# Patient Record
Sex: Male | Born: 1955 | Race: Black or African American | Hispanic: No | Smoking: Never smoker
Health system: Southern US, Community
[De-identification: ages and names within clinical notes are randomized; demographics above are authoritative.]

## PROBLEM LIST (undated history)

## (undated) DIAGNOSIS — F4310 Post-traumatic stress disorder, unspecified: Secondary | ICD-10-CM

## (undated) DIAGNOSIS — I10 Essential (primary) hypertension: Secondary | ICD-10-CM

## (undated) DIAGNOSIS — D649 Anemia, unspecified: Secondary | ICD-10-CM

## (undated) DIAGNOSIS — G8190 Hemiplegia, unspecified affecting unspecified side: Secondary | ICD-10-CM

## (undated) DIAGNOSIS — I639 Cerebral infarction, unspecified: Secondary | ICD-10-CM

## (undated) DIAGNOSIS — E119 Type 2 diabetes mellitus without complications: Secondary | ICD-10-CM

---

## 2003-07-22 ENCOUNTER — Emergency Department (HOSPITAL_COMMUNITY): Admission: EM | Admit: 2003-07-22 | Discharge: 2003-07-22 | Payer: Self-pay | Admitting: *Deleted

## 2007-04-15 ENCOUNTER — Emergency Department (HOSPITAL_COMMUNITY): Admission: EM | Admit: 2007-04-15 | Discharge: 2007-04-15 | Payer: Self-pay | Admitting: Emergency Medicine
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
## 2011-02-05 ENCOUNTER — Other Ambulatory Visit: Payer: Self-pay | Admitting: Occupational Medicine

## 2011-02-05 ENCOUNTER — Ambulatory Visit: Payer: Self-pay

## 2011-02-05 DIAGNOSIS — Z021 Encounter for pre-employment examination: Secondary | ICD-10-CM

## 2011-02-05 IMAGING — CR DG CHEST 1V
1 series · 1 of 1 positions shown · non-contrast
Comparison: None.

CLINICAL DATA: Pre-employment physical.  Nonsmoker.

CHEST - 1 VIEW

[view not recorded]
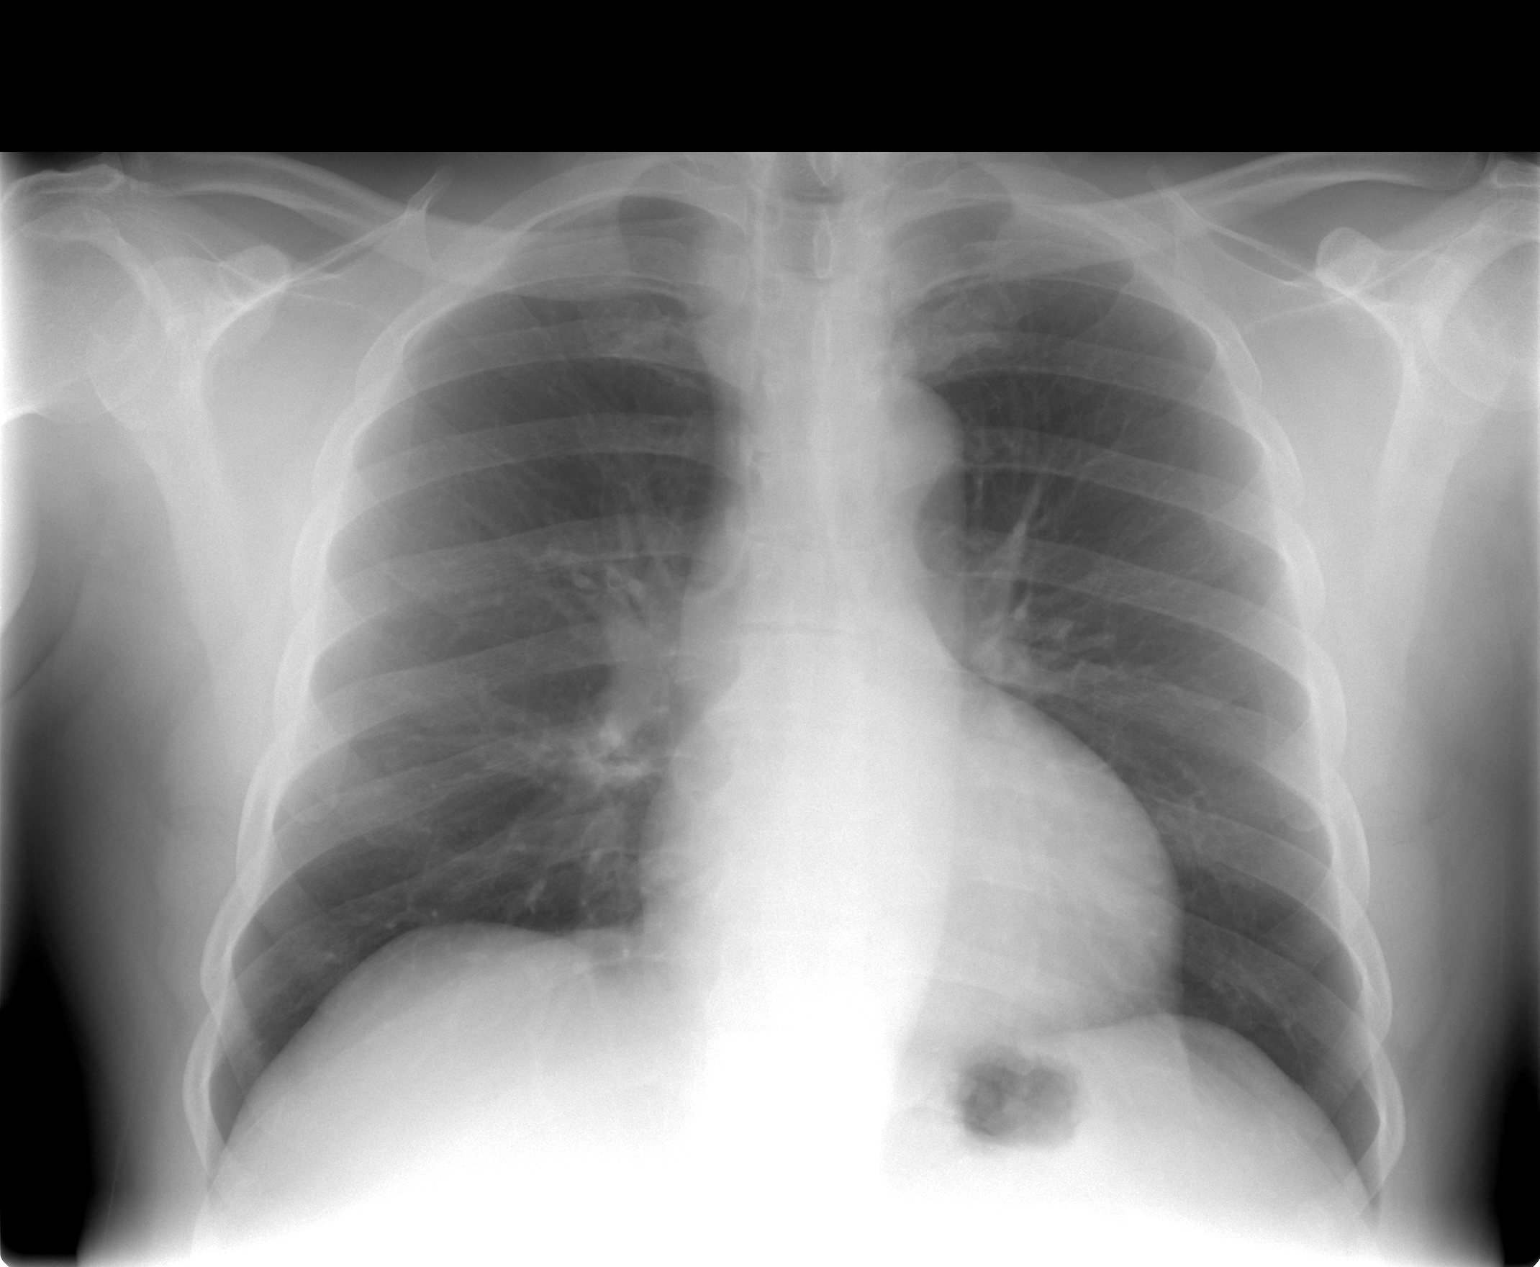

[1 of 1 positions shown; findings below may reference images not displayed]

FINDINGS: The heart and mediastinal contours are within normal
limits. The lungs are clear. No airspace disease, pleural effusion,
or pneumothorax is identified. The visualized bony thorax is
unremarkable.
IMPRESSION: No acute cardiopulmonary disease.

## 2016-03-08 DIAGNOSIS — IMO0002 Reserved for concepts with insufficient information to code with codable children: Secondary | ICD-10-CM | POA: Insufficient documentation

## 2016-03-08 DIAGNOSIS — I10 Essential (primary) hypertension: Secondary | ICD-10-CM | POA: Diagnosis present

## 2016-03-08 DIAGNOSIS — Z7409 Other reduced mobility: Secondary | ICD-10-CM | POA: Insufficient documentation

## 2016-03-25 ENCOUNTER — Encounter: Payer: Self-pay | Admitting: Occupational Therapy

## 2016-03-25 ENCOUNTER — Ambulatory Visit: Payer: Managed Care, Other (non HMO) | Attending: Physical Medicine & Rehabilitation | Admitting: Physical Therapy

## 2016-03-25 ENCOUNTER — Ambulatory Visit: Payer: Managed Care, Other (non HMO) | Admitting: Occupational Therapy

## 2016-03-25 VITALS — BP 156/93

## 2016-03-25 DIAGNOSIS — R41841 Cognitive communication deficit: Secondary | ICD-10-CM | POA: Insufficient documentation

## 2016-03-25 DIAGNOSIS — I679 Cerebrovascular disease, unspecified: Principal | ICD-10-CM

## 2016-03-25 DIAGNOSIS — R2681 Unsteadiness on feet: Secondary | ICD-10-CM | POA: Diagnosis present

## 2016-03-25 DIAGNOSIS — M6281 Muscle weakness (generalized): Secondary | ICD-10-CM | POA: Diagnosis present

## 2016-03-25 DIAGNOSIS — M25612 Stiffness of left shoulder, not elsewhere classified: Secondary | ICD-10-CM | POA: Insufficient documentation

## 2016-03-25 DIAGNOSIS — G8112 Spastic hemiplegia affecting left dominant side: Secondary | ICD-10-CM | POA: Diagnosis present

## 2016-03-25 DIAGNOSIS — R2689 Other abnormalities of gait and mobility: Secondary | ICD-10-CM | POA: Diagnosis present

## 2016-03-25 DIAGNOSIS — I69352 Hemiplegia and hemiparesis following cerebral infarction affecting left dominant side: Secondary | ICD-10-CM

## 2016-03-25 DIAGNOSIS — I69315 Cognitive social or emotional deficit following cerebral infarction: Secondary | ICD-10-CM | POA: Diagnosis present

## 2016-03-25 DIAGNOSIS — R293 Abnormal posture: Secondary | ICD-10-CM | POA: Diagnosis present

## 2016-03-25 DIAGNOSIS — M25622 Stiffness of left elbow, not elsewhere classified: Secondary | ICD-10-CM | POA: Insufficient documentation

## 2016-03-25 NOTE — Therapy (Signed)
[facility] [address] [place], [place], [address] Phone: [phone]   Fax:  [phone]  Occupational Therapy Evaluation  Patient Details  Name: Adam Perez MRN: [id number] Date of Birth: 1963-07-22 Referring Provider: Dr. Renato Gails  Encounter Date: 03/25/2016      OT End of Session - 03/25/16 1223    Visit Number 1   Number of Visits 16   Date for OT Re-Evaluation 05/20/16   Authorization Type [employer], 62 PT/OT ,, no auth required.    OT Start Time 0845   OT Stop Time 0925   OT Time Calculation (min) 40 min   Activity Tolerance Patient tolerated treatment well      History reviewed. No pertinent past medical history.  History reviewed. No pertinent surgical history.  There were no vitals filed for this visit.      Subjective Assessment - 03/25/16 0851    Subjective  My leg and arm are numb   Patient is accompained by: Family member  sister, Marcha   Pertinent History see epic, care everywhere. Pt with R CVA, HTN,    Patient Stated Goals I want to walk normally and be able to hold a cup without dropping it   Currently in Pain? No/denies           [facility] OT Assessment - 03/25/16 5638      Assessment   Diagnosis R CVA   Referring Provider Dr. Renato Gails   Onset Date 03/01/16  symptomatic 02/29/2016   Prior Therapy Inpt rehab PT, OT and ST     Precautions   Precautions Fall   Required Braces or Orthoses Other Brace/Splint   Other Brace/Splint air cast L foot due to fall and sprain of ankle,       Restrictions   Weight Bearing Restrictions No     Balance Screen   Has the patient fallen in the past 6 months Yes   How many times? 1  pt fell the day of the stroke     Home  Environment   Family/patient expects to be discharged to: Private residence   Living Arrangements Other relatives  lives with his brother   Available Help at Discharge --  see below   Type of Home House   Home  Layout One level   Bathroom Shower/Tub Tub/Shower unit  pt is going to get transfer tub Wellsite geologist  pt uses sink to assist with sit to stand   Additional Comments Pt has friends and siblings who can assist. 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   Leisure watch sports     ADL   Eating/Feeding Minimal assistance  for cutting, uses R hand (pt is left handed)   Grooming Modified independent   Upper Body Bathing Minimal assistance   Lower Body Bathing Modified independent   Upper Body Dressing Increased time   Lower Body Dressing Minimal assistance  assist for tying   Toilet Tranfer Modified independent   Toileting - Clothing Manipulation Modified independent   Toileting -  Hygiene Modified Independent   Tub/Shower Transfer Minimal assistance   ADL comments Pt is awaiting transfer tub bench;  showered in inpt rehab using bench and needed min a      IADL   Shopping Needs to be accompanied on any shopping  trip   Light Housekeeping Does not participate in any housekeeping tasks   Meal Prep Needs to have meals prepared and served   [employer] Relies on family or friends for transportation   Medication Management Is responsible for taking medication in correct dosages at correct time   Development worker, communityinancial Management Manages financial matters independently (budgets, writes checks, pays rent, bills goes to bank), collects and keeps track of income     Mobility   Mobility Status History of falls     Written Expression   Dominant Hand Left   Handwriting 100% legible  printing of name with non dominant hand     Vision - History   Baseline Vision Wears glasses only for reading   Additional Comments Pt denies any visual changes since stroke     Activity Tolerance   Activity Tolerance Tolerate 30+ min activity without fatigue      Cognition   Mini Mental State Exam  Pt and sister deny any cognitive changes.  Pt had ST in inpt rehab.  Will need to further assess.   Area of Impairment Safety/judgement;Attention;Awareness   Safety/Judgement Decreased awareness of safety;Decreased awareness of deficits   Awareness Emergent     Sensation   Light Touch Appears Intact   Hot/Cold Appears Intact   Proprioception Appears Intact     Coordination   Gross Motor Movements are Fluid and Coordinated No   Fine Motor Movements are Fluid and Coordinated No   Other Pt does not have enough active movement to complete standardized tests at this time.     Tone   Assessment Location Left Upper Extremity     ROM / Strength   AROM / PROM / Strength AROM;PROM;Strength     AROM   Overall AROM  Deficits   Overall AROM Comments LUE- pt has active shoulder elevation, some active depression, IR, thumb flexion and extension.      PROM   Overall PROM  Deficits   Overall PROM Comments LUE- shoulder flexion 105*, abduction 100* limited by tighness (pt denies pain), elbow extension -5*, no ER (passivley to neutral only), wrist extension 10*, 90* composite flexion.       Strength   Overall Strength Deficits;Unable to assess   Overall Strength Comments Unable to assess due to altered tone;  pt has very limited isolated movement in LUE     Hand Function   Right Hand Gross Grasp Functional   Left Hand Gross Grasp Impaired   Comment Pt only has isolated thumb movement in hand.      LUE Tone   LUE Tone Moderate     LLE Tone   LLE Tone Modified Ashworth     LLE Tone   Modified Ashworth Scale for Grading Hypertonia LLE Considerable increase in muschle tone, passive movement difficult                           OT Short Term Goals - 03/25/16 1053      OT SHORT TERM GOAL #1   Title Pt and family will be mod I with HEP - 04/22/2016   Status New     OT SHORT TERM GOAL #2   Title Pt will demonstrate ability for PROM of  shoulder fleixon to 130* in supine with no pain to aide in self care and as prep for low functional reach.   Status New     OT SHORT TERM GOAL #3   Title Pt  will be mod I with bathing   Status New     OT SHORT TERM GOAL #4   Title Pt will be mod I with tub bench transfers   Status New     OT SHORT TERM GOAL #5   Title Pt will be mod I with tying shoes using AE   Status New           OT Long Term Goals - 03/25/16 1056      OT LONG TERM GOAL #1   Title Pt and family will be mod I with upgraded HEP- 05/20/2016   Status New     OT LONG TERM GOAL #2   Title Pt will be mod I with simple familiar hot meal prep   Status New     OT LONG TERM GOAL #3   Title Pt will demonstrate ability to use LUE as stabilizer during basic ADL's 50% of the time.   Status New     OT LONG TERM GOAL #4   Title Pt will demonstrate ability to employ 2 strategies to reduce tone in LUE in order to improve ability to use LUE as stabilizer in basic ADL's               Plan - 03/25/16 1212    Clinical Impression Statement Pt is a 61 year old male s/p R CVA on 03/01/2016 and hospitalized until 03/15/2016 (inlcudes inpt rehab stay).  PMH: R ICA stenosis 60%, narrowing of R vetebral artery, L ankle sprain from fall, HTN, prediabetic.  Pt presents with the following deficits that impact his ability to complete ADL's, IADl's, leisure and return to work:  Spastic L dominant hemplegia, decreased AROM, decreased PROM, decreased cognition, decreased balance, decreased actiivty tolerance, decreased postural control.  Pt will benefit from skilled OT to address these deficts and maximize independence.     Rehab Potential Good   Clinical Impairments Affecting Rehab Potential decreased insight, severity of UE deficits   OT Frequency 2x / week   OT Duration 8 weeks   OT Treatment/Interventions Self-care/ADL training;Moist Heat;Electrical Stimulation;DME and/or AE instruction;Neuromuscular education;Therapeutic  exercise;Functional Mobility Training;Manual Therapy;Passive range of motion;Splinting;Therapeutic activities;Balance training;Cognitive remediation/compensation   Plan initiate HEP as possible, manual therapy to increase PROM of LUE, NMR for LUE/trunk, balance, functional mobitlity   Consulted and Agree with Plan of Care Patient;Family member/caregiver   Family Member Consulted sister, Charlestine Massed      Patient will benefit from skilled therapeutic intervention in order to improve the following deficits and impairments:  Abnormal gait, Decreased activity tolerance, Decreased balance, Decreased cognition, Decreased knowledge of use of DME, Decreased mobility, Decreased range of motion, Decreased safety awareness, Difficulty walking, Decreased strength, Impaired UE functional use, Impaired tone  Visit Diagnosis: 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, unspecified cerebrovascular disease type (HCC) - Plan: Ot plan of care cert/re-cert  Muscle weakness (generalized) - Plan: Ot plan of care cert/re-cert  Abnormal posture - Plan: Ot plan of care cert/re-cert  Stiffness of left shoulder, not elsewhere classified - Plan: Ot plan of care cert/re-cert  Stiffness of left elbow, not elsewhere classified - Plan: Ot plan of care cert/re-cert  Cognitive social or emotional deficit following cerebral infarction - Plan: Ot plan of care cert/re-cert  Unsteadiness on feet - Plan: Ot plan of care cert/re-cert    Problem List There are no active problems to display for this patient.   Norton Pastel 03/25/2016, 12:28 PM  [facility] [address] [place], [place], [address] Phone: [phone]   Fax:  [phone]  Name: POSEIDON PAM MRN: [id number] Date of Birth: 18-Jan-1956

## 2016-03-25 NOTE — Therapy (Signed)
[facility] [address] [place], [place], [address] Phone: [phone]   Fax:  [phone]  Physical Therapy Evaluation  Patient Details  Name: Adam Perez MRN: [id number] Date of Birth: 1955-05-29 Referring Provider: Renato GailsJohn Aguilar, MD  Encounter Date: 03/25/2016      PT End of Session - 03/25/16 0910    Visit Number 1   Number of Visits 16   Date for PT Re-Evaluation 05/20/16   Authorization Type [employer]-awaiting verification of benefits   PT Start Time 0800   PT Stop Time 0845   PT Time Calculation (min) 45 min   Equipment Utilized During Treatment Gait belt   Activity Tolerance Patient tolerated treatment well   Behavior [facility] Therapy [facility] for tasks assessed/performed      No past medical history on file.  No past surgical history on file.  Vitals:   03/25/16 0815 03/25/16 0835 03/25/16 0845  BP: (!) 167/95 (!) 177/103 (!) 156/93         Subjective Assessment - 03/25/16 0803    Subjective Pt is a 61 y/o male who presents to OPPT s/p R CVA resulting in residual L sided weakness.  Pt had [facility].  Pt presents today in w/c with quad cane reporting he walks short distances only.   Patient is accompained by: Family member  sister   Pertinent History HTN, PTSD   Limitations Standing;Walking;House hold activities   Patient Stated Goals improve mobility, increase use of LUE   Currently in Pain? No/denies            [facility] Assessment - 03/25/16 [id number]      Assessment   Medical Diagnosis R CVA   Referring Provider Renato GailsJohn Aguilar, MD   Onset Date/Surgical Date 02/29/16   Hand Dominance Left   Next MD Visit unsure   Prior Therapy inpatient rehab     Precautions   Precautions Fall   Required Braces or Orthoses Other Brace/Splint   Other Brace/Splint aircast L ankle (due to fall and sprain of ankle)     Restrictions   Weight Bearing Restrictions No     Balance Screen   Has the patient fallen in the past 6 months Yes   How many times? 1   Has the patient had a decrease in activity level because of a fear of falling?  Yes   Is the patient reluctant to leave their home because of a fear of falling?  Yes     Home Environment   Living Environment Private residence   Living Arrangements Other relatives  brother   Available Help at Discharge Family;Available PRN/intermittently  pt home alone ~ 8 hours   Type of Home House   Home Access Stairs to enter   Entrance Stairs-Number of Steps 2   Entrance Stairs-Rails None   Home Layout One level   Home Equipment Cane - quad;Bedside commode;Wheelchair - manual     Prior Function   Level of Independence Independent   Vocation Full time employment   Theatre managerVocation Requirements maintainence mechanic, climbing, working in Engineer, maintenance (IT)electrical boxes, walking most of 12 hour shifts   Leisure watch sports     Cognition   Overall Cognitive Status Impaired/Different from baseline   Area of Impairment Attention;Safety/judgement;Awareness   Safety/Judgement Decreased awareness of safety   Awareness Emergent   Attention Sustained     Observation/Other Assessments   Focus on Therapeutic Outcomes (FOTO)  45 (55% limited; predicted 37% limited)  Stroke Impact Scale  Mobility: 31%; Hand Function: 25% (lower values indicate greater impairment)     Tone   Assessment Location Left Lower Extremity     Strength   Strength Assessment Site Hip;Knee;Ankle   Right Hip Flexion 5/5   Left Hip Flexion 2/5   Right/Left Knee Right;Left   Right Knee Flexion 5/5   Right Knee Extension 5/5   Left Knee Flexion 2/5   Left Knee Extension 3/5   Right Ankle Dorsiflexion 5/5   Left Ankle Dorsiflexion 0/5     Transfers   Transfers Sit to Stand;Stand to Sit   Sit to Stand 5: Supervision;With upper extremity assist;From chair/3-in-1   Stand to Sit 5: Supervision;With upper extremity assist;To chair/3-in-1     Ambulation/Gait    Ambulation/Gait Yes   Ambulation/Gait Assistance 4: Min guard   Ambulation Distance (Feet) 100 Feet   Assistive device Large base quad cane   Gait Pattern Step-to pattern;Decreased stance time - left;Decreased step length - right;Decreased hip/knee flexion - left;Decreased dorsiflexion - left;Left circumduction;Left hip hike   Ambulation Surface Level;Indoor   Gait velocity 0.86 ft/sec  2m: 37.83 sec   Gait Comments utilized aircast for L ankle stability     Standardized Balance Assessment   Standardized Balance Assessment Timed Up and Go Test     Timed Up and Go Test   Normal TUG (seconds) 32.53     LLE Tone   LLE Tone Moderate                           PT Education - 03/25/16 0909    Education provided Yes   Education Details clinical findings, POC, goals of care   Person(s) Educated Patient   Methods Explanation   Comprehension Verbalized understanding          PT Short Term Goals - 03/25/16 0916      PT SHORT TERM GOAL #1   Title verbalize understanding of CVA risk factors/warning signs (Target Date for all STGs: 04/22/16)   Status New     PT SHORT TERM GOAL #2   Title perform BERG with appropriate LTG to be written    Status New     PT SHORT TERM GOAL #3   Title improve timed up and go to < 27 sec for improved mobility   Status New     PT SHORT TERM GOAL #4   Title improve gait velocity to > 1.2 ft/sec for improved mobility   Status New           PT Long Term Goals - 03/25/16 1610      PT LONG TERM GOAL #1   Title independent with HEP (Target Date for all LTGs: 05/20/16)   Status New     PT LONG TERM GOAL #2   Title amb > 500' on various indoor/outdoor surfaces with LRAD (and possibly AFO) modified independent for improved community access   Status New     PT LONG TERM GOAL #3   Title improve timed up and go to < 20 sec for improved mobility    Status New     PT LONG TERM GOAL #4   Title improve gait velocity to > 1.8 ft/sec  for decreased fall risk and improved mobility   Status New               Plan - 03/25/16 0911    Clinical Impression Statement Pt is 61 y/o  male who presents to OPPT for high complexity PT eval following CVA on 02/29/16.  Pt presents today with gait abnormalities, decreased strength, decreased balance and increased tone affecting safe, functional mobility.  Pt will benefit from PT to address deficits listed.  BP elevated after amb which decreased after 5 min seated rest break.   Rehab Potential Good   Clinical Impairments Affecting Rehab Potential cognition, severity of deficits   PT Frequency 2x / week   PT Duration 8 weeks   PT Treatment/Interventions ADLs/Self Care Home Management;Electrical Stimulation;Cryotherapy;Neuromuscular re-education;Balance training;Therapeutic exercise;Therapeutic activities;Functional mobility training;Stair training;Gait training;DME Instruction;Patient/family education;Orthotic Fit/Training;Vestibular;Taping   PT Next Visit Plan try AFO with gait, establish HEP for LE strengthening, balance and gait   Consulted and Agree with Plan of Care Patient;Family member/caregiver   Family Member Consulted sister      Patient will benefit from skilled therapeutic intervention in order to improve the following deficits and impairments:  Abnormal gait, Decreased activity tolerance, Decreased balance, Decreased cognition, Decreased mobility, Difficulty walking, Decreased strength, Impaired tone, Decreased knowledge of precautions, Decreased knowledge of use of DME  Visit Diagnosis: Hemiplegia and hemiparesis following cerebral infarction affecting left dominant side (HCC) - Plan: PT plan of care cert/re-cert  Muscle weakness (generalized) - Plan: PT plan of care cert/re-cert  Other abnormalities of gait and mobility - Plan: PT plan of care cert/re-cert     Problem List There are no active problems to display for this patient.     Clarita Crane, PT,  DPT 03/25/16 10:25 AM    [facility] [address] [place], [place], [address] Phone: [phone]   Fax:  [phone]  Name: Adam Perez MRN: [id number] Date of Birth: Aug 14, 1955

## 2016-03-29 ENCOUNTER — Ambulatory Visit: Payer: Managed Care, Other (non HMO)

## 2016-04-02 ENCOUNTER — Ambulatory Visit: Payer: Managed Care, Other (non HMO) | Admitting: *Deleted

## 2016-04-02 ENCOUNTER — Encounter: Payer: Self-pay | Admitting: Occupational Therapy

## 2016-04-02 ENCOUNTER — Ambulatory Visit: Payer: Managed Care, Other (non HMO) | Admitting: Speech Pathology

## 2016-04-02 ENCOUNTER — Ambulatory Visit: Payer: Managed Care, Other (non HMO) | Admitting: Occupational Therapy

## 2016-04-02 DIAGNOSIS — M25612 Stiffness of left shoulder, not elsewhere classified: Secondary | ICD-10-CM

## 2016-04-02 DIAGNOSIS — I679 Cerebrovascular disease, unspecified: Principal | ICD-10-CM

## 2016-04-02 DIAGNOSIS — M6281 Muscle weakness (generalized): Secondary | ICD-10-CM

## 2016-04-02 DIAGNOSIS — R293 Abnormal posture: Secondary | ICD-10-CM

## 2016-04-02 DIAGNOSIS — I69352 Hemiplegia and hemiparesis following cerebral infarction affecting left dominant side: Secondary | ICD-10-CM | POA: Diagnosis not present

## 2016-04-02 DIAGNOSIS — R41841 Cognitive communication deficit: Secondary | ICD-10-CM

## 2016-04-02 DIAGNOSIS — I69315 Cognitive social or emotional deficit following cerebral infarction: Secondary | ICD-10-CM

## 2016-04-02 DIAGNOSIS — G8112 Spastic hemiplegia affecting left dominant side: Secondary | ICD-10-CM

## 2016-04-02 DIAGNOSIS — M25622 Stiffness of left elbow, not elsewhere classified: Secondary | ICD-10-CM

## 2016-04-02 DIAGNOSIS — R2681 Unsteadiness on feet: Secondary | ICD-10-CM

## 2016-04-02 NOTE — Therapy (Signed)
[facility] [address] [place], [place], [address] Phone: [phone]   Fax:  [phone]  Occupational Therapy Treatment  Patient Details  Name: Adam Perez MRN: [id number] Date of Birth: [birth year] Referring Provider: Dr. Renato Gails  Encounter Date: 04/02/2016      OT End of Session - 04/02/16 1649    Visit Number 2   Number of Visits 16   Date for OT Re-Evaluation 05/20/16   Authorization Type [employer], 60 PT/OT ,, no auth required.    OT Start Time 1403   OT Stop Time 1445   OT Time Calculation (min) 42 min   Activity Tolerance Patient tolerated treatment well      History reviewed. No pertinent past medical history.  History reviewed. No pertinent surgical history.  There were no vitals filed for this visit.      Subjective Assessment - 04/02/16 1408    Subjective  What do you want me to do at home?   Pertinent History see epic, care everywhere. Pt with R CVA, HTN,    Patient Stated Goals I want to walk normally and be able to hold a cup without dropping it   Currently in Pain? No/denies                      OT Treatments/Exercises (OP) - 04/02/16 0001      ADLs   ADL Comments Reviewed goals, POC with pt - pt had numerous questions and comments. Pt is very tangential and verbose and requires max redirection to stay on topic.  Pt in agreement with goals and plan.     Neurological Re-education Exercises   Other Exercises 1 Neuro re ed in supine to address scapular alignment, normalize tone and begin to address scapular/trunk stability.  Pt requires max vc's and redirection.  ALso addressed aligment and normal movement patterns for bed mobility for sit to supine and supine to sit - pt able to complete with max cues for impulsivity and for sequencing.  DIscussed need to respect any pain pt is having in the shoulder vs just hauling arm up over his head - after much dicussion pt verbalized  understanding.  Addressed alignment and using LE's during sit to stand, stand to sit and transfers using quad cane to encourage more active LLE and trunk as well as beginning to address scapular stability.                   OT Short Term Goals - 04/02/16 1647      OT SHORT TERM GOAL #1   Title Pt and family will be mod I with HEP - 04/22/2016   Status On-going     OT SHORT TERM GOAL #2   Title Pt will demonstrate ability for PROM of shoulder fleixon to 130* in supine with no pain to aide in self care and as prep for low functional reach.   Status On-going     OT SHORT TERM GOAL #3   Title Pt will be mod I with bathing   Status On-going     OT SHORT TERM GOAL #4   Title Pt will be mod I with tub bench transfers   Status On-going     OT SHORT TERM GOAL #5   Title Pt will be mod I with tying shoes using AE   Status On-going           OT Long Term Goals - 04/02/16 1647  OT LONG TERM GOAL #1   Title Pt and family will be mod I with upgraded HEP- 05/20/2016   Status On-going     OT LONG TERM GOAL #2   Title Pt will be mod I with simple familiar hot meal prep   Status On-going     OT LONG TERM GOAL #3   Title Pt will demonstrate ability to use LUE as stabilizer during basic ADL's 50% of the time.   Status On-going     OT LONG TERM GOAL #4   Title Pt will demonstrate ability to employ 2 strategies to reduce tone in LUE in order to improve ability to use LUE as stabilizer in basic ADL's   Status On-going               Plan - 04/02/16 1647    Clinical Impression Statement Pt with slow progress toward goals. Pt needs max cues and max redirection for impulsivity, disinhibition.    Rehab Potential Good   Clinical Impairments Affecting Rehab Potential decreased insight, severity of UE deficits   OT Frequency 2x / week   OT Duration 8 weeks   OT Treatment/Interventions Self-care/ADL training;Moist Heat;Electrical Stimulation;DME and/or AE  instruction;Neuromuscular education;Therapeutic exercise;Functional Mobility Training;Manual Therapy;Passive range of motion;Splinting;Therapeutic activities;Balance training;Cognitive remediation/compensation   Plan NMR for LUE/trunk for proximal stability and control, functional mobility, manual therapy, balance.    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   Norton Pastelulaski, Geraldina Parrott Halliday, OTR/L 04/02/2016, 4:50 PM  [facility] [address] [place], [place], [address] Phone: [phone]   Fax:  [phone]  Name: Adam Perez MRN: [id number] Date of Birth: 1955/10/06

## 2016-04-02 NOTE — Therapy (Signed)
[facility] [address] [place], [place], [address] Phone: [phone]   Fax:  [phone]  Speech Language Pathology Evaluation  Patient Details  Name: Adam Perez MRN: [id number] Date of Birth: Nov 07, 1955 Referring Provider: Dr. Renato Gails  Encounter Date: 04/02/2016      End of Session - 04/02/16 1502    Visit Number 1   Number of Visits 1   Date for SLP Re-Evaluation --  N/A   SLP Start Time 1317   SLP Stop Time  1402   SLP Time Calculation (min) 45 min   Activity Tolerance Patient tolerated treatment well      No past medical history on file.  No past surgical history on file.  There were no vitals filed for this visit.      Subjective Assessment - 04/02/16 1323    Subjective "I had speech therapy - we worked on my memory, attention and doing 2 things at once - I did good"            SLP Evaluation [facility] - 04/02/16 1324      SLP Visit Information   SLP Received On 04/02/16   Referring Provider aguilar, Jonny Ruiz   Onset Date 02/29/16   Medical Diagnosis CVA     Subjective   Subjective "It don't seem like the stroke affected my thinking"     Pain Assessment   Currently in Pain? 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)  version 2                               Plan - 04/02/16 1503    Clinical Impression Statement [name], a 61 y.o. male  is s/p CVA on 02/29/16, hospitalized 03/01/16 to 03/15/16. He received ST in the hospital for med management and "doing 2 things at once" per pt. 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 He received ST as a child and reports no change in his speech since this CVA.  Pt denies difficulty with memory. He reports he is managing his medication and finances successfully at this time. [facility] Cognitive Assessment revealed score WFL, with recall of 3/5 words with a delay, however pt recalls details of PT/OT evals and course of tx and recent med changes with mod I. Pt solved simple time and money word problems presented orally with mod I. He demonstrated adequate divided attention  performing informal check writing/balancing task while participating in conversation with adequate attention to details and accurate check writing and balancing.  As pt currently with left side UE and LE paresis, independent participation in ADL's is  limited. At this time, I do not recommend skilled ST as cognition is [facility] for tasks assessed.    Consulted and Agree with Plan of Care Patient      Patient will benefit from skilled therapeutic intervention in order to improve the following deficits and impairments:   Cognitive communication deficit      G-Codes - 04/02/16 1512    Functional Limitations Memory   Memory Current Status (N8295(G9168) At least 1 percent but less than 20 percent impaired, limited or restricted   Memory Goal Status (A2130(G9169) At least 1 percent but less than 20 percent impaired, limited or restricted   Memory Discharge Status (G9170) At least 1 percent but less than 20 percent impaired, limited or restricted      Problem List There are no active problems to display for this  patient.   Dominiq Fontaine, Radene JourneyLaura Ann MS, CCC-SLP 04/02/2016, 3:12 PM  [facility] [address] [place], [place], [address] Phone: [phone]   Fax:  [phone]  Name: Adam AnnaDonald L Perez MRN: [id number] Date of Birth: [birth year]

## 2016-04-03 ENCOUNTER — Encounter: Payer: Self-pay | Admitting: Occupational Therapy

## 2016-04-03 ENCOUNTER — Ambulatory Visit: Payer: Managed Care, Other (non HMO) | Admitting: Physical Therapy

## 2016-04-03 ENCOUNTER — Encounter: Payer: Self-pay | Admitting: Physical Therapy

## 2016-04-03 ENCOUNTER — Ambulatory Visit: Payer: Managed Care, Other (non HMO) | Admitting: Occupational Therapy

## 2016-04-03 VITALS — BP 148/88 | HR 68

## 2016-04-03 DIAGNOSIS — I69352 Hemiplegia and hemiparesis following cerebral infarction affecting left dominant side: Secondary | ICD-10-CM

## 2016-04-03 DIAGNOSIS — I679 Cerebrovascular disease, unspecified: Principal | ICD-10-CM

## 2016-04-03 DIAGNOSIS — M6281 Muscle weakness (generalized): Secondary | ICD-10-CM

## 2016-04-03 DIAGNOSIS — G8112 Spastic hemiplegia affecting left dominant side: Secondary | ICD-10-CM

## 2016-04-03 DIAGNOSIS — R2681 Unsteadiness on feet: Secondary | ICD-10-CM

## 2016-04-03 DIAGNOSIS — I69315 Cognitive social or emotional deficit following cerebral infarction: Secondary | ICD-10-CM

## 2016-04-03 DIAGNOSIS — R2689 Other abnormalities of gait and mobility: Secondary | ICD-10-CM

## 2016-04-03 DIAGNOSIS — M25622 Stiffness of left elbow, not elsewhere classified: Secondary | ICD-10-CM

## 2016-04-03 DIAGNOSIS — R293 Abnormal posture: Secondary | ICD-10-CM

## 2016-04-03 DIAGNOSIS — M25612 Stiffness of left shoulder, not elsewhere classified: Secondary | ICD-10-CM

## 2016-04-03 NOTE — Therapy (Signed)
[facility] [address] [place], [place], [address] Phone: [phone]   Fax:  [phone]  Physical Therapy Treatment  Patient Details  Name: Adam Perez MRN: [id number] Date of Birth: [birth year] Referring Provider: Renato Gails, MD  Encounter Date: 04/03/2016      PT End of Session - 04/03/16 1313    Visit Number 2   Number of Visits 16   Date for PT Re-Evaluation 05/20/16   Authorization Type [employer]-60 visit limit combined PT/OT   PT Start Time 1024   PT Stop Time 1109   PT Time Calculation (min) 45 min   Activity Tolerance Patient tolerated treatment well   Behavior During Therapy [facility] for tasks assessed/performed      History reviewed. No pertinent past medical history.  History reviewed. No pertinent surgical history.  Vitals:   04/03/16 1034  BP: (!) 148/88  Pulse: 68        Subjective Assessment - 04/03/16 1029    Subjective Pt without any complaints; pt stating he is out of his blood thinner and asking how to get it refilled-deferred to pharmacy.  Needs help to put L ankle support on.   Pertinent History HTN, PTSD   Limitations Standing;Walking;House hold activities   Patient Stated Goals improve mobility/walking, increase use of LUE   Currently in Pain? No/denies                         [facility] Adult PT Treatment/Exercise - 04/03/16 1036      Standardized Balance Assessment   Standardized Balance Assessment Berg Balance Test     Berg Balance Test   Sit to Stand Able to stand  independently using hands   Standing Unsupported Able to stand 2 minutes with supervision   Sitting with Back Unsupported but Feet Supported on Floor or Stool Able to sit safely and securely 2 minutes   Stand to Sit Controls descent by using hands   Transfers Able to transfer safely, minor use of hands   Standing Unsupported with Eyes Closed Able to stand 10 seconds with supervision   Standing  Ubsupported with Feet Together Able to place feet together independently and stand for 1 minute with supervision   From Standing, Reach Forward with Outstretched Arm Can reach forward >12 cm safely (5")   From Standing Position, Pick up Object from Floor Able to pick up shoe, needs supervision   From Standing Position, Turn to Look Behind Over each Shoulder Looks behind one side only/other side shows less weight shift   Turn 360 Degrees Needs close supervision or verbal cueing   Standing Unsupported, Alternately Place Feet on Step/Stool Able to complete >2 steps/needs minimal assist   Standing Unsupported, One Foot in Front Able to plae foot ahead of the other independently and hold 30 seconds   Standing on One Leg Able to lift leg independently and hold equal to or more than 3 seconds   Total Score 39     Exercises   Exercises Knee/Hip     Knee/Hip Exercises: Seated   Stool Scoot - Round Trips in w/c with LLE only x 5'; minimal activation noted   Sit to Sand 1 set;10 reps;without UE support  UE on LLE to facilitate weight shift and WB through LLE     Knee/Hip Exercises: Supine   Bridges Strengthening;Both;1 set;10 reps   Bridges Limitations verbal cues for equal activation of L glute and to maintain hip IR  PT Education - 04/03/16 1312    Education provided Yes   Education Details administered BERG; educated on falls risk.  Initiated HEP but did not provide handouts this session   Person(s) Educated Patient   Methods Explanation   Comprehension Verbalized understanding          PT Short Term Goals - 04/03/16 1641      PT SHORT TERM GOAL #1   Title verbalize understanding of CVA risk factors/warning signs (Target Date for all STGs: 04/22/16)   Status New     PT SHORT TERM GOAL #2   Title perform BERG with appropriate LTG to be written    Status Achieved     PT SHORT TERM GOAL #3   Title improve timed up and go to < 27 sec for improved mobility    Status New     PT SHORT TERM GOAL #4   Title improve gait velocity to > 1.2 ft/sec for improved mobility   Status New     PT SHORT TERM GOAL #5   Title Will improve BERG balance score to >45/56 to decrease risk for falls    Baseline 39/56 (04/03/16)   Status New           PT Long Term Goals - 03/25/16 1610      PT LONG TERM GOAL #1   Title independent with HEP (Target Date for all LTGs: 05/20/16)   Status New     PT LONG TERM GOAL #2   Title amb > 500' on various indoor/outdoor surfaces with LRAD (and possibly AFO) modified independent for improved community access   Status New     PT LONG TERM GOAL #3   Title improve timed up and go to < 20 sec for improved mobility    Status New     PT LONG TERM GOAL #4   Title improve gait velocity to > 1.8 ft/sec for decreased fall risk and improved mobility   Status New               Plan - 04/03/16 1313    Clinical Impression Statement Treatment session with focus on continued assessment of balance, falls risk with BERG balance assessment; pt demonstrating significant falls risk.  Initiated sit <> stand training with focus on L NMR and increasing WB and activation through LLE and increasing activation of L hamstring mm with bridges and single leg pull in w/c.  Will continue to address and develop HEP.   Rehab Potential Good   Clinical Impairments Affecting Rehab Potential cognition, severity of deficits   PT Treatment/Interventions ADLs/Self Care Home Management;Electrical Stimulation;Cryotherapy;Neuromuscular re-education;Balance training;Therapeutic exercise;Therapeutic activities;Functional mobility training;Stair training;Gait training;DME Instruction;Patient/family education;Orthotic Fit/Training;Vestibular;Taping   PT Next Visit Plan try AFO with gait, set HEP and provide handouts, 2 stairs with cane   Consulted and Agree with Plan of Care Patient      Patient will benefit from skilled therapeutic intervention in order  to improve the following deficits and impairments:  Abnormal gait, Decreased activity tolerance, Decreased balance, Decreased cognition, Decreased mobility, Difficulty walking, Decreased strength, Impaired tone, Decreased knowledge of precautions, Decreased knowledge of use of DME  Visit Diagnosis: Hemiplegia and hemiparesis following cerebral infarction affecting left dominant side (HCC)  Muscle weakness (generalized)  Other abnormalities of gait and mobility     Problem List There are no active problems to display for this patient.  Edman Circle, PT, DPT 04/03/16    4:45 PM    [facility] [address] [place], [place], [address] Phone: [phone]   Fax:  [phone]  Name: Adam Perez MRN: [id number] Date of Birth: 11/06/1955

## 2016-04-03 NOTE — Therapy (Signed)
[facility] [address] [place], [place], [address] Phone: [phone]   Fax:  [phone]  Occupational Therapy Treatment  Patient Details  Name: Adam Perez Perez: [id number] Date of Birth: Mar 23, 1955 Referring Provider: Dr. Renato GailsJohn Perez  Encounter Date: 04/03/2016      OT End of Session - 04/03/16 1255    Visit Number 3   Number of Visits 16   Date for OT Re-Evaluation 05/20/16   Authorization Type [employer], 60 PT/OT ,, no auth required.    OT Start Time 1145   OT Stop Time 1232   OT Time Calculation (min) 47 min   Activity Tolerance Patient tolerated treatment well      History reviewed. No pertinent past medical history.  History reviewed. No pertinent surgical history.  There were no vitals filed for this visit.      Subjective Assessment - 04/03/16 1153    Subjective  I can control sitting down when I really want to.    Pertinent History see epic, care everywhere. Pt with R CVA, HTN,    Patient Stated Goals I want to walk normally and be able to hold a cup without dropping it   Currently in Pain? No/denies  Pt does have intermittent pain in L shoulder with passive flexion beyond 90*                      OT Treatments/Exercises (OP) - 04/03/16 0001      Neurological Re-education Exercises   Other Exercises 1 Neuro re ed to address functional ambulation with quad cane with emphasis on aligment, weight shifting to the left, increased activation of LLE and trunk as well as L proximal shoulder girdle with functional ambulation.  ALso addresed controlled sit to stand and stand to sit without UE's to improve finding midline, increased activiation of L side during activity.  Addressed building LUE proximal stability in sitting with sideleaning on LUE with reaching activity with RUE - after repetition and mod vc's for impulsivity pt able to control L shoulder girdle indepedent of assist.  Progressed  to unilateral low reach in closed chain on moving surface for immediate feedback - with guided movement and repetition pt eventually able to initiate foward flexion and holding of moveable surface at various points of low reach. Also addressed using LUE as stabilizer with forced paradaigm in standing.                   OT Short Term Goals - 04/03/16 1254      OT SHORT TERM GOAL #1   Title Pt and family will be mod I with HEP - 04/22/2016   Status On-going     OT SHORT TERM GOAL #2   Title Pt will demonstrate ability for PROM of shoulder fleixon to 130* in supine with no pain to aide in self care and as prep for low functional reach.   Status On-going     OT SHORT TERM GOAL #3   Title Pt will be mod I with bathing   Status On-going     OT SHORT TERM GOAL #4   Title Pt will be mod I with tub bench transfers   Status On-going     OT SHORT TERM GOAL #5   Title Pt will be mod I with tying shoes using AE   Status On-going           OT Long Term Goals - 04/03/16 1254  OT LONG TERM GOAL #1   Title Pt and family will be mod I with upgraded HEP- 05/20/2016   Status On-going     OT LONG TERM GOAL #2   Title Pt will be mod I with simple familiar hot meal prep   Status On-going     OT LONG TERM GOAL #3   Title Pt will demonstrate ability to use LUE as stabilizer during basic ADL's 50% of the time.   Status On-going     OT LONG TERM GOAL #4   Title Pt will demonstrate ability to employ 2 strategies to reduce tone in LUE in order to improve ability to use LUE as stabilizer in basic ADL's   Status On-going               Plan - 04/03/16 1254    Clinical Impression Statement Pt progressing toward goals. Pt with improved attention today and decreased verbosity.    Rehab Potential Good   Clinical Impairments Affecting Rehab Potential decreased insight, severity of UE deficits   OT Frequency 2x / week   OT Duration 8 weeks   OT Treatment/Interventions  Self-care/ADL training;Moist Heat;Electrical Stimulation;DME and/or AE instruction;Neuromuscular education;Therapeutic exercise;Functional Mobility Training;Manual Therapy;Passive range of motion;Splinting;Therapeutic activities;Balance training;Cognitive remediation/compensation   Plan NMR for LUE/trunk for proximal stability and beginning shoulder flexion/low reach, functional mobility, manual therapy, balance.    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   [name] , OTR/L 04/03/2016, 12:57 PM  [facility] [address] [place], [place], [address] Phone: [phone]   Fax:  [phone]  Name: Adam Perez: [id number] Date of Birth: 1955-05-27

## 2016-04-04 ENCOUNTER — Ambulatory Visit: Payer: Managed Care, Other (non HMO) | Admitting: *Deleted

## 2016-04-04 ENCOUNTER — Ambulatory Visit: Payer: Managed Care, Other (non HMO) | Admitting: Physical Therapy

## 2016-04-10 ENCOUNTER — Ambulatory Visit: Payer: Managed Care, Other (non HMO) | Admitting: Occupational Therapy

## 2016-04-10 ENCOUNTER — Ambulatory Visit: Payer: Managed Care, Other (non HMO) | Admitting: Physical Therapy

## 2016-04-10 ENCOUNTER — Encounter: Payer: Self-pay | Admitting: Occupational Therapy

## 2016-04-10 ENCOUNTER — Encounter: Payer: Self-pay | Admitting: Physical Therapy

## 2016-04-10 DIAGNOSIS — M25622 Stiffness of left elbow, not elsewhere classified: Secondary | ICD-10-CM

## 2016-04-10 DIAGNOSIS — I69315 Cognitive social or emotional deficit following cerebral infarction: Secondary | ICD-10-CM

## 2016-04-10 DIAGNOSIS — G8112 Spastic hemiplegia affecting left dominant side: Secondary | ICD-10-CM

## 2016-04-10 DIAGNOSIS — I679 Cerebrovascular disease, unspecified: Secondary | ICD-10-CM

## 2016-04-10 DIAGNOSIS — M6281 Muscle weakness (generalized): Secondary | ICD-10-CM

## 2016-04-10 DIAGNOSIS — R2681 Unsteadiness on feet: Secondary | ICD-10-CM

## 2016-04-10 DIAGNOSIS — I69352 Hemiplegia and hemiparesis following cerebral infarction affecting left dominant side: Secondary | ICD-10-CM

## 2016-04-10 DIAGNOSIS — R293 Abnormal posture: Secondary | ICD-10-CM

## 2016-04-10 DIAGNOSIS — M25612 Stiffness of left shoulder, not elsewhere classified: Secondary | ICD-10-CM

## 2016-04-10 DIAGNOSIS — R2689 Other abnormalities of gait and mobility: Secondary | ICD-10-CM

## 2016-04-10 NOTE — Therapy (Signed)
[facility] [address] [place], [place], [address] Phone: [phone]   Fax:  [phone]  Physical Therapy Treatment  Patient Details  Name: Adam Perez MRN: [id number] Date of Birth: Mar 26, 1955 Referring Provider: Renato Gails, MD  Encounter Date: 04/10/2016      PT End of Session - 04/10/16 0949    Visit Number 3   Number of Visits 16   Date for PT Re-Evaluation 05/20/16   Authorization Type [employer]-60 visit limit combined PT/OT   PT Start Time 0839   PT Stop Time 0930   PT Time Calculation (min) 51 min   Activity Tolerance Patient limited by pain;Patient tolerated treatment well   Behavior During Therapy [facility] for tasks assessed/performed      History reviewed. No pertinent past medical history.  History reviewed. No pertinent surgical history.  There were no vitals filed for this visit.      Subjective Assessment - 04/10/16 0915    Subjective No c/o since last visit; reports attempting exercises at home.  Pt with MD appointment soon, pt is concerned about FMLA and being ready to return to work after 8 weeks.   Pertinent History HTN, PTSD   Limitations Standing;Walking;House hold activities   Patient Stated Goals improve mobility/walking, increase use of LUE for work   Currently in Pain? No/denies          [facility] Adult PT Treatment/Exercise - 04/10/16 0917      Ambulation/Gait   Ambulation/Gait Yes   Ambulation/Gait Assistance 4: Min guard   Ambulation Distance (Feet) 100 Feet   Assistive device Large base quad cane   Ambulation Surface Level;Indoor   Gait Comments No aircast used today; Verbal, visual and manual cues during gait to facilitate increased lateral weight shifting, WB and increased stance time on LLE, full step length RLE and activation of L hamstring and hip flexors during swing for increased foot clearance and step length.     Neuro Re-ed    Neuro Re-ed Details  Performed NMR for  LLE in prone; required mod A to transition in to prone and position LUE in ABD and ER off side of mat.  Also placed in prone to stretch anterior shoulder and hip mm while performing activation of L hamstring and gluteal mm with max tactile and verbal cues.  Transitioned to quadruped > tall kneeling where NMR focused on lateral weight shifting with pelvis, weight shifting to L and performing tall kneeling squats for activation of hamstring and glutes, and lateral weight shifting with alternating R and LLE advancement forwards and backwards with max verbal, tactile cues at pelvis and trunk for elongation<>shortening.  Continued NMR in sitting with focus on low reaching forwards and to the L and weight shifting/WB onto LUE and LLE during controlled sit <> stand with max tactile and verbal at head and trunk to minimize overuse and shortening of R neck and trunk muscles.             PT Education - 04/10/16 0945    Education provided Yes   Education Details Continued education on functional WB and use of LUE and LLE to assist with regaining movement and function.   Person(s) Educated Patient   Methods Explanation   Comprehension Need further instruction          PT Short Term Goals - 04/03/16 1641      PT SHORT TERM GOAL #1   Title verbalize understanding of CVA risk factors/warning signs (Target Date  for all STGs: 04/22/16)   Status New     PT SHORT TERM GOAL #2   Title perform BERG with appropriate LTG to be written    Status Achieved     PT SHORT TERM GOAL #3   Title improve timed up and go to < 27 sec for improved mobility   Status New     PT SHORT TERM GOAL #4   Title improve gait velocity to > 1.2 ft/sec for improved mobility   Status New     PT SHORT TERM GOAL #5   Title Will improve BERG balance score to >45/56 to decrease risk for falls    Baseline 39/56 (04/03/16)   Status New           PT Long Term Goals - 03/25/16 [id number]      PT LONG TERM GOAL #1   Title independent  with HEP (Target Date for all LTGs: 05/20/16)   Status New     PT LONG TERM GOAL #2   Title amb > 500' on various indoor/outdoor surfaces with LRAD (and possibly AFO) modified independent for improved community access   Status New     PT LONG TERM GOAL #3   Title improve timed up and go to < 20 sec for improved mobility    Status New     PT LONG TERM GOAL #4   Title improve gait velocity to > 1.8 ft/sec for decreased fall risk and improved mobility   Status New               Plan - 04/10/16 1034    Clinical Impression Statement Treatment session with focus on continued NMR for motor activation on L side and grading of muscle activation on R side during gait and in WB positions after performing ROM to allow for more ROM in shoulder, trunk, pelvis and LE.  Pt very motivated to regain function in L side.  Pt tolerated well but continues to have increased tone and limited ROM on L side and will benefit from continued PT services to address impairments and progress towards targeted goals.   Rehab Potential Good   Clinical Impairments Affecting Rehab Potential cognition, severity of deficits   PT Treatment/Interventions ADLs/Self Care Home Management;Electrical Stimulation;Cryotherapy;Neuromuscular re-education;Balance training;Therapeutic exercise;Therapeutic activities;Functional mobility training;Stair training;Gait training;DME Instruction;Patient/family education;Orthotic Fit/Training;Vestibular;Taping   PT Next Visit Plan set HEP and provide handouts-stretching hip flexor, hamstring and lateral hip activation.  AFO with gait?   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  Edman CircleAudra Hall, PT, DPT 04/10/16    10:41 AM    [facility] [address] [place], [place], [address] Phone: [phone]   Fax:  [phone]  Name: Adam Perez MRN: [id number] Date of Birth: Mar 17, 1955

## 2016-04-10 NOTE — Therapy (Signed)
[facility] [address] [place], [place], [address] Phone: [phone]   Fax:  [phone]  Occupational Therapy Treatment  Patient Details  Name: Adam Perez MRN: [id number] Date of Birth: Aug 05, 1955 Referring Provider: Dr. Renato Gails  Encounter Date: 04/10/2016      OT End of Session - 04/10/16 1300    Visit Number 4   Number of Visits 16   Date for OT Re-Evaluation 05/20/16   Authorization Type [employer], 60 PT/OT ,, no auth required.    OT Start Time [phone]   OT Stop Time 1016   OT Time Calculation (min) 45 min   Activity Tolerance Patient tolerated treatment well      History reviewed. No pertinent past medical history.  History reviewed. No pertinent surgical history.  There were no vitals filed for this visit.      Subjective Assessment - 04/10/16 0935    Subjective  I am getting around a little better   Pertinent History see epic, care everywhere. Pt with R CVA, HTN,    Patient Stated Goals I want to walk normally and be able to hold a cup without dropping it   Currently in Pain? No/denies                      OT Treatments/Exercises (OP) - 04/10/16 0001      ADLs   ADL Comments Addressed self feeding (rocker knife), dressing (shoe buttons), bathing (hemi bathing technique and use of LH sponge) and simple food prep with adapted cutting board and use of rolling cart.  Pt strongly encouraged to have supervision from wife while in kitchen for safety and balance -pt verbalized agreement. Pt able to practice using all equipment and strategies and is now able to complete bathing and dresssing with strategies.  Also addressed balance, functional mobility and safety in kitchen.                  OT Education - 04/10/16 1147    Education provided Yes   Education Details ADL strategies and equipment - pt given name of medical suppy with address and phone number.    Person(s) Educated  Patient   Methods Explanation;Demonstration;Verbal cues;Handout   Comprehension Verbalized understanding;Returned demonstration          OT Short Term Goals - 04/10/16 1147      OT SHORT TERM GOAL #1   Title Pt and family will be mod I with HEP - 04/22/2016   Status On-going     OT SHORT TERM GOAL #2   Title Pt will demonstrate ability for PROM of shoulder fleixon to 130* in supine with no pain to aide in self care and as prep for low functional reach.   Status On-going     OT SHORT TERM GOAL #3   Title Pt will be mod I with bathing   Status Achieved     OT SHORT TERM GOAL #4   Title Pt will be mod I with tub bench transfers   Status On-going     OT SHORT TERM GOAL #5   Title Pt will be mod I with tying shoes using AE   Status Achieved           OT Long Term Goals - 04/10/16 1148      OT LONG TERM GOAL #1   Title Pt and family will be mod I with upgraded HEP- 05/20/2016   Status On-going  OT LONG TERM GOAL #2   Title Pt will be mod I with simple familiar hot meal prep   Status On-going     OT LONG TERM GOAL #3   Title Pt will demonstrate ability to use LUE as stabilizer during basic ADL's 50% of the time.   Status On-going     OT LONG TERM GOAL #4   Title Pt will demonstrate ability to employ 2 strategies to reduce tone in LUE in order to improve ability to use LUE as stabilizer in basic ADL's   Status On-going               Plan - 04/10/16 1148    Clinical Impression Statement Pt progressing toward goals. Pt to follow up to obtain ADL equipment prn   Rehab Potential Good   Clinical Impairments Affecting Rehab Potential decreased insight, severity of UE deficits   OT Frequency 2x / week   OT Duration 8 weeks   OT Treatment/Interventions Self-care/ADL training;Moist Heat;Electrical Stimulation;DME and/or AE instruction;Neuromuscular education;Therapeutic exercise;Functional Mobility Training;Manual Therapy;Passive range of  motion;Splinting;Therapeutic activities;Balance training;Cognitive remediation/compensation   Plan NMR for LUE/TRUNK and proximal stability;  beginning shoulder flexion/low reach, mobility, manual therapy balance   Consulted and Agree with Plan of Care Patient      Patient will benefit from skilled therapeutic intervention in order to improve the following deficits and impairments:  Abnormal gait, Decreased activity tolerance, Decreased balance, Decreased cognition, Decreased knowledge of use of DME, Decreased mobility, Decreased range of motion, Decreased safety awareness, Difficulty walking, Decreased strength, Impaired UE functional use, Impaired tone  Visit Diagnosis: Muscle weakness (generalized)  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   Norton Pastelulaski, Karen Halliday, OTR/L 04/10/2016, 1:01 PM  [facility] [address] [place], [place], [address] Phone: [phone]   Fax:  [phone]  Name: Leveda AnnaDonald L Cuffe MRN: [id number] Date of Birth: Dec 28, 1955

## 2016-04-11 ENCOUNTER — Encounter: Payer: Self-pay | Admitting: Speech Pathology

## 2016-04-11 ENCOUNTER — Encounter: Payer: Self-pay | Admitting: Occupational Therapy

## 2016-04-11 ENCOUNTER — Ambulatory Visit: Payer: Managed Care, Other (non HMO) | Attending: Physical Medicine & Rehabilitation | Admitting: Occupational Therapy

## 2016-04-11 ENCOUNTER — Ambulatory Visit: Payer: Managed Care, Other (non HMO) | Admitting: Physical Therapy

## 2016-04-11 VITALS — BP 151/92 | HR 67

## 2016-04-11 DIAGNOSIS — I679 Cerebrovascular disease, unspecified: Secondary | ICD-10-CM

## 2016-04-11 DIAGNOSIS — M6281 Muscle weakness (generalized): Secondary | ICD-10-CM | POA: Insufficient documentation

## 2016-04-11 DIAGNOSIS — R2689 Other abnormalities of gait and mobility: Secondary | ICD-10-CM | POA: Diagnosis present

## 2016-04-11 DIAGNOSIS — M25622 Stiffness of left elbow, not elsewhere classified: Secondary | ICD-10-CM | POA: Diagnosis present

## 2016-04-11 DIAGNOSIS — R2681 Unsteadiness on feet: Secondary | ICD-10-CM | POA: Diagnosis present

## 2016-04-11 DIAGNOSIS — M25612 Stiffness of left shoulder, not elsewhere classified: Secondary | ICD-10-CM | POA: Diagnosis present

## 2016-04-11 DIAGNOSIS — G8112 Spastic hemiplegia affecting left dominant side: Secondary | ICD-10-CM

## 2016-04-11 DIAGNOSIS — I69315 Cognitive social or emotional deficit following cerebral infarction: Secondary | ICD-10-CM | POA: Insufficient documentation

## 2016-04-11 DIAGNOSIS — R293 Abnormal posture: Secondary | ICD-10-CM | POA: Diagnosis present

## 2016-04-11 DIAGNOSIS — I69352 Hemiplegia and hemiparesis following cerebral infarction affecting left dominant side: Secondary | ICD-10-CM | POA: Diagnosis present

## 2016-04-11 NOTE — Therapy (Signed)
[facility] [address] [place], [place], [address] Phone: [phone]   Fax:  [phone]  Occupational Therapy Treatment  Patient Details  Name: Adam Perez MRN: [id number] Date of Birth: 04/26/1955 Referring Provider: Dr. Renato Gails  Encounter Date: 04/11/2016      OT End of Session - 04/11/16 1658    Visit Number 5   Number of Visits 16   Date for OT Re-Evaluation 05/20/16   Authorization Type [employer], 60 PT/OT ,, no auth required.    OT Start Time 1448   OT Stop Time 1530   OT Time Calculation (min) 42 min   Activity Tolerance Patient tolerated treatment well      History reviewed. No pertinent past medical history.  History reviewed. No pertinent surgical history.  Vitals:   04/11/16 1459  BP: (!) 151/92  Pulse: 67        Subjective Assessment - 04/11/16 1456    Subjective  I am doing my shower and dressing by myself now   Pertinent History see epic, care everywhere. Pt with R CVA, HTN,    Patient Stated Goals I want to walk normally and be able to hold a cup without dropping it   Currently in Pain? No/denies                      OT Treatments/Exercises (OP) - 04/11/16 0001      ADLs   Functional Mobility Addressed tub bench transfers with pt - pt able to complete however provided some suggestions to make transfer easier and safer.  Pt able to return demonstate.  Pt reports he is now dressing by himself except for shoe tying - pt states he is getting shoe buttons.      Neurological Re-education Exercises   Other Exercises 1 Neuro re ed to address proximal stability and beginning activity in LUE in  sitting and supine - pt can now achieve 150* shoulder flexion in supine with no pain.  Pt with very poor isolated volitional movement in LUE.  Addressed trunk control in sitting, standing and functional ambulation.                  OT Education - 04/10/16 1147    Education  provided Yes   Education Details ADL strategies and equipment - pt given name of medical suppy with address and phone number.    Person(s) Educated Patient   Methods Explanation;Demonstration;Verbal cues;Handout   Comprehension Verbalized understanding;Returned demonstration          OT Short Term Goals - 04/11/16 1653      OT SHORT TERM GOAL #1   Title Pt and family will be mod I with HEP - 04/22/2016   Status On-going     OT SHORT TERM GOAL #2   Title Pt will demonstrate ability for PROM of shoulder fleixon to 130* in supine with no pain to aide in self care and as prep for low functional reach.   Status Achieved  03/12/2016 150* in supine 0/10     OT SHORT TERM GOAL #3   Title Pt will be mod I with bathing   Status Achieved     OT SHORT TERM GOAL #4   Title Pt will be mod I with tub bench transfers   Status Achieved     OT SHORT TERM GOAL #5   Title Pt will be mod I with tying shoes using AE   Status Achieved  OT Long Term Goals - 04/11/16 1653      OT LONG TERM GOAL #1   Title Pt and family will be mod I with upgraded HEP- 05/20/2016   Status On-going     OT LONG TERM GOAL #2   Title Pt will be mod I with simple familiar hot meal prep   Status On-going     OT LONG TERM GOAL #3   Title Pt will demonstrate ability to use LUE as stabilizer during basic ADL's 50% of the time.   Status On-going     OT LONG TERM GOAL #4   Title Pt will demonstrate ability to employ 2 strategies to reduce tone in LUE in order to improve ability to use LUE as stabilizer in basic ADL's   Status On-going               Plan - 04/11/16 1654    Clinical Impression Statement Pt progressing toward goals. Pt with slowly improving postural control in sitting and standing.   Rehab Potential Good   Clinical Impairments Affecting Rehab Potential decreased insight, severity of UE deficits   OT Frequency 2x / week   OT Duration 8 weeks   OT Treatment/Interventions  Self-care/ADL training;Moist Heat;Electrical Stimulation;DME and/or AE instruction;Neuromuscular education;Therapeutic exercise;Functional Mobility Training;Manual Therapy;Passive range of motion;Splinting;Therapeutic activities;Balance training;Cognitive remediation/compensation   Plan HEP for LUE, NMR for LUE/trunk and proximal stability, begining shoulder flexion/low reach, mobility, manual therapy   Consulted and Agree with Plan of Care Patient      Patient will benefit from skilled therapeutic intervention in order to improve the following deficits and impairments:  Abnormal gait, Decreased activity tolerance, Decreased balance, Decreased cognition, Decreased knowledge of use of DME, Decreased mobility, Decreased range of motion, Decreased safety awareness, Difficulty walking, Decreased strength, Impaired UE functional use, Impaired tone  Visit Diagnosis: Muscle weakness (generalized)  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, unspecified cerebrovascular disease type (HCC)  Abnormal posture  Stiffness of left shoulder, not elsewhere classified  Stiffness of left elbow, not elsewhere classified  Unsteadiness on feet  Cognitive social or emotional deficit following cerebral infarction  Hemiplegia and hemiparesis following cerebral infarction affecting left dominant side (HCC)    Problem List There are no active problems to display for this patient.   Norton Pastelulaski, Karen Halliday, OTR/L 04/11/2016, 5:00 PM  [facility] [address] [place], [place], [address] Phone: [phone]   Fax:  [phone]  Name: Adam Perez MRN: [id number] Date of Birth: [birth year]

## 2016-04-12 NOTE — Therapy (Signed)
[facility] [address] [place], [place], [address] Phone: [phone]   Fax:  [phone]  Physical Therapy Treatment  Patient Details  Name: Adam Perez MRN: [id number] Date of Birth: 01-Dec-1955 Referring Provider: Renato Gails, MD  Encounter Date: 04/11/2016      PT End of Session - 04/12/16 0936    Visit Number 4   Number of Visits 16   Date for PT Re-Evaluation 05/20/16   Authorization Type [employer]-60 visit limit combined PT/OT   PT Start Time 1315   PT Stop Time 1400   PT Time Calculation (min) 45 min   Equipment Utilized During Treatment Gait belt;Other (comment)  air cast used on LLE      No past medical history on file.  No past surgical history on file.  There were no vitals filed for this visit.      Subjective Assessment - 04/12/16 0919    Subjective Pt reports no problems since PT session yesterday (Wed.); states he drags his left foot - "she said she was going to get me a brace"   Pertinent History HTN, PTSD   Limitations Standing;Walking;House hold activities   Patient Stated Goals improve mobility/walking, increase use of LUE for work   Currently in Pain? No/denies                         [facility] Adult PT Treatment/Exercise - 04/12/16 0001      Transfers   Transfers Sit to Stand   Sit to Stand 5: Supervision   Number of Reps Other reps (comment)  3   Comments with RUE assist     Ambulation/Gait   Ambulation/Gait Yes   Ambulation/Gait Assistance 4: Min guard   Ambulation Distance (Feet) 120 Feet   Assistive device Small based quad cane   Gait Pattern Step-to pattern;Decreased stance time - left;Decreased step length - right;Decreased hip/knee flexion - left;Decreased dorsiflexion - left;Left circumduction;Left hip hike   Ambulation Surface Level;Indoor   Gait Comments Toe off AFO trialed initially on LLE with minimal benefit of providing significant increased L foot  clearnce in swing phase of gait (significant recurvatum L knee occurring in stance):  Blue Rocker AFO then trialed next with use of heel wedge in L shoe: increased L foot clearance with less L genu recurvatum in stance noted; tactile cues to increase weight shift and stance time on LLE with verbal cues to increase R step length     Knee/Hip Exercises: Supine   Heel Slides AROM;Left;1 set;10 reps;Limitations  increased extensor tone in L quads - min assist to initiate    Bridges Strengthening;1 set;10 reps   Straight Leg Raises AROM;Left;1 set;10 reps   Other Supine Knee/Hip Exercises L 1/2 bridging 10 reps with cues to decrease speed   Other Supine Knee/Hip Exercises L hip abdct/adduction in hooklying with manual moderate resistance given x 10 reps;  L hip extension control off side of mat x 10 reps with min assist to lift LLE back onot mat;  stretching of L hip flexors with LLE off side of mat with knee flexed                 PT Education - 04/12/16 0935    Education provided Yes   Education Details Pt was given exercise sheet with hip control exercises - instructed to do Lt hip abduction and hip extension control off side of bed at home   Person(s) Educated  Patient   Methods Explanation;Demonstration;Handout   Comprehension Verbalized understanding;Returned demonstration          PT Short Term Goals - 04/12/16 0944      PT SHORT TERM GOAL #1   Title verbalize understanding of CVA risk factors/warning signs (Target Date for all STGs: 04/22/16)     PT SHORT TERM GOAL #2   Title perform BERG with appropriate LTG to be written    Status Achieved     PT SHORT TERM GOAL #3   Title improve timed up and go to < 27 sec for improved mobility     PT SHORT TERM GOAL #4   Title improve gait velocity to > 1.2 ft/sec for improved mobility     PT SHORT TERM GOAL #5   Title Will improve BERG balance score to >45/56 to decrease risk for falls            PT Long Term Goals -  03/25/16 [id number]      PT LONG TERM GOAL #1   Title independent with HEP (Target Date for all LTGs: 05/20/16)   Status New     PT LONG TERM GOAL #2   Title amb > 500' on various indoor/outdoor surfaces with LRAD (and possibly AFO) modified independent for improved community access   Status New     PT LONG TERM GOAL #3   Title improve timed up and go to < 20 sec for improved mobility    Status New     PT LONG TERM GOAL #4   Title improve gait velocity to > 1.8 ft/sec for decreased fall risk and improved mobility   Status New               Plan - 04/12/16 [id number]    Clinical Impression Statement Pt presents with increased extensor tone/spasticity in LLE with hamstring weakness with minimal knee flexion or ankle dorsiflexion activation in swing phase of gait.  Blue rocker AFO more beneficial than toe off AFO in providing Lt foot clearance in swing phase and control of left genu recurvatum in stance phase of gait.  Pt would benefit from AFO to increase safety and independence with gait.   Rehab Potential Good   PT Frequency 2x / week   PT Duration 8 weeks   PT Treatment/Interventions ADLs/Self Care Home Management;Electrical Stimulation;Cryotherapy;Neuromuscular re-education;Balance training;Therapeutic exercise;Therapeutic activities;Functional mobility training;Stair training;Gait training;DME Instruction;Patient/family education;Orthotic Fit/Training;Vestibular;Taping   PT Next Visit Plan Continue gait with AFO - contact othotist for consult; add to HEP (only hip abduction and hip extension control exs have been given to date)   PT Home Exercise Plan see above   Consulted and Agree with Plan of Care Patient      Patient will benefit from skilled therapeutic intervention in order to improve the following deficits and impairments:  Abnormal gait, Decreased activity tolerance, Decreased balance, Decreased cognition, Decreased mobility, Difficulty walking, Decreased strength, Impaired tone,  Decreased knowledge of precautions, Decreased knowledge of use of DME  Visit Diagnosis: Muscle weakness (generalized)  Other abnormalities of gait and mobility  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, unspecified cerebrovascular disease type (HCC)     Problem List There are no active problems to display for this patient.   Kary KosDilday, Riku Buttery Suzanne, PT 04/12/2016, 9:47 AM  [facility] [address] [place], [place], [address] Phone: [phone]   Fax:  [phone]  Name: Adam Perez MRN: [id number] Date of Birth: [birth year]

## 2016-04-12 NOTE — Patient Instructions (Signed)
Butterfly, Supine    Lie on back, feet together. Lower knees toward floor. Hold _2__ seconds. Repeat _15_ times per session. Do _3__ sessions per day.  MOVE LEFT LEG ONLY Copyright  VHI. All rights reserved.

## 2016-04-15 ENCOUNTER — Ambulatory Visit: Payer: Managed Care, Other (non HMO) | Admitting: Occupational Therapy

## 2016-04-15 ENCOUNTER — Encounter: Payer: Self-pay | Admitting: Occupational Therapy

## 2016-04-15 VITALS — BP 170/104 | HR 62

## 2016-04-15 DIAGNOSIS — M6281 Muscle weakness (generalized): Secondary | ICD-10-CM

## 2016-04-15 NOTE — Patient Instructions (Signed)
Pt educated on risks of uncontrolled BP. Pt has monitor at home but states he has not been using it. Also states primary MD increased BP meds last week and he has been taking religiously since last Friday. Strongly recommended that pt contact MD today - pt leaving clinic and going to MD office directly per pt desire.

## 2016-04-15 NOTE — Therapy (Signed)
[facility] [address] [place], [place], [address] Phone: [phone]   Fax:  [phone]  Occupational Therapy Treatment  Patient Details  Name: Adam Perez MRN: [id number] Date of Birth: October 27, 1955 Referring Provider: Dr. Renato Gails  Encounter Date: 04/15/2016    History reviewed. No pertinent past medical history.  History reviewed. No pertinent surgical history.  Vitals:   04/15/16 1135  BP: (!) 170/104  Pulse: 62        Subjective Assessment - 04/15/16 1136    Subjective  I know you are going to check my BP.   Pertinent History see epic, care everywhere. Pt with R CVA, HTN,    Patient Stated Goals I want to walk normally and be able to hold a cup without dropping it   Currently in Pain? No/denies                                OT Short Term Goals - 04/11/16 1653      OT SHORT TERM GOAL #1   Title Pt and family will be mod I with HEP - 04/22/2016   Status On-going     OT SHORT TERM GOAL #2   Title Pt will demonstrate ability for PROM of shoulder fleixon to 130* in supine with no pain to aide in self care and as prep for low functional reach.   Status Achieved  03/12/2016 150* in supine 0/10     OT SHORT TERM GOAL #3   Title Pt will be mod I with bathing   Status Achieved     OT SHORT TERM GOAL #4   Title Pt will be mod I with tub bench transfers   Status Achieved     OT SHORT TERM GOAL #5   Title Pt will be mod I with tying shoes using AE   Status Achieved           OT Long Term Goals - 04/11/16 1653      OT LONG TERM GOAL #1   Title Pt and family will be mod I with upgraded HEP- 05/20/2016   Status On-going     OT LONG TERM GOAL #2   Title Pt will be mod I with simple familiar hot meal prep   Status On-going     OT LONG TERM GOAL #3   Title Pt will demonstrate ability to use LUE as stabilizer during basic ADL's 50% of the time.   Status On-going     OT LONG TERM GOAL #4   Title Pt will demonstrate ability to employ 2 strategies to reduce tone in LUE in order to improve ability to use LUE as stabilizer in basic ADL's   Status On-going       Pt arrived at clinic after brief walk back into clinic, BP at 170/104, HR 62. After 5 minute rest, 161/106, HR 61.  After additional 5 minute rest, manual BP 162/104, HR 61.  Session terminated, pt educated on risk of high BP, pt decided to go directly to MD office upon leaving clinic.  Encouraged pt to ask MD how often he should be monitoring BP at home as pt does have a machine but is not currently using it. Pt verbalized understanding.       Patient will benefit from skilled therapeutic intervention in order to improve the following deficits and impairments:     Visit Diagnosis: Muscle weakness (  generalized)    Problem List There are no active problems to display for this patient.   Norton Pastelulaski, Karen Halliday, OTR/L 04/15/2016, 11:38 AM  [facility] [address] [place], [place], [address] Phone: [phone]   Fax:  [phone]  Name: Adam Perez MRN: [id number] Date of Birth: June 05, 1955

## 2016-04-16 ENCOUNTER — Ambulatory Visit: Payer: Managed Care, Other (non HMO) | Admitting: Occupational Therapy

## 2016-04-16 ENCOUNTER — Encounter: Payer: Self-pay | Admitting: Speech Pathology

## 2016-04-16 ENCOUNTER — Ambulatory Visit: Payer: Self-pay | Admitting: Physical Therapy

## 2016-04-17 ENCOUNTER — Encounter: Payer: Self-pay | Admitting: Physical Therapy

## 2016-04-17 ENCOUNTER — Ambulatory Visit: Payer: Managed Care, Other (non HMO) | Admitting: Physical Therapy

## 2016-04-17 ENCOUNTER — Encounter: Payer: Self-pay | Admitting: Speech Pathology

## 2016-04-17 VITALS — BP 128/84 | HR 75

## 2016-04-17 DIAGNOSIS — R2689 Other abnormalities of gait and mobility: Secondary | ICD-10-CM

## 2016-04-17 DIAGNOSIS — M6281 Muscle weakness (generalized): Secondary | ICD-10-CM | POA: Diagnosis not present

## 2016-04-17 DIAGNOSIS — R293 Abnormal posture: Secondary | ICD-10-CM

## 2016-04-17 DIAGNOSIS — G8112 Spastic hemiplegia affecting left dominant side: Secondary | ICD-10-CM

## 2016-04-17 DIAGNOSIS — I679 Cerebrovascular disease, unspecified: Secondary | ICD-10-CM

## 2016-04-17 DIAGNOSIS — R2681 Unsteadiness on feet: Secondary | ICD-10-CM

## 2016-04-17 NOTE — Therapy (Signed)
[facility] [address] [place], [place], [address] Phone: [phone]   Fax:  [phone]  Physical Therapy Treatment  Patient Details  Name: Adam AnnaDonald L Perez MRN: [id number] Date of Birth: 06-Mar-1956 Referring Provider: Renato GailsJohn Aguilar, MD  Encounter Date: 04/17/2016      PT End of Session - 04/17/16 2136    Visit Number 5   Number of Visits 16   Date for PT Re-Evaluation 05/20/16   Authorization Type [employer]-60 visit limit combined PT/OT   PT Start Time 1445   PT Stop Time 1530   PT Time Calculation (min) 45 min   Equipment Utilized During Treatment Gait belt;Other (comment)  air cast used on LLE   Activity Tolerance Patient tolerated treatment well   Behavior [facility] Therapy [facility] for tasks assessed/performed      History reviewed. No pertinent past medical history.  History reviewed. No pertinent surgical history.  Vitals:   04/17/16 1452  BP: 128/84  Pulse: 75        Subjective Assessment - 04/17/16 1447    Subjective Patient reports saw doctor yesterday and his BP was good.    Pertinent History HTN, PTSD   Limitations Standing;Walking;House hold activities   Patient Stated Goals improve mobility/walking, increase use of LUE for work   Currently in Pain? No/denies     Orthotic Training with [employer] AFO with heel wedge: PT demo, instructed in step length with initial contact of heel past toe of stance limb with goal of equality. Pt ambulated 150' & 3550' with [facility] with minA / tactile, manual cues for upright posture, balance reactions and weight shift to stance limb.   Neuromuscular Re-education in parallel bars with mirror for visual feedback, manual / tactile cues for knee control: Step position with right foot forward with weight shift between feet, progressed to weight shift with left knee flexion closed chain terminal stance. Seated on 24" bar stool without UE support: forward flexion reaching  towards ground to upright posture. Sit to stand without UE assist with verbal cues on weight shift anteriorly prior to righting trunk. Step ups on 4" block: LLE on block stepping up forward and backward with RLE with manual cues for L knee control; eccentric control with LLE on block and stepping RLE forward off /on block; and side step with LLE on block and stepping RLE laterally on/off block.                     [facility] Adult PT Treatment/Exercise - 04/17/16 1445      Transfers   Transfers Sit to Stand;Stand to Sit                PT Education - 04/17/16 1445    Education provided Yes   Education Details [employer] insurance with retired Hotel managermilitary. Pt plans to call to see if he is elgible because he served for 22 years.    Person(s) Educated Patient   Methods Explanation   Comprehension Verbalized understanding          PT Short Term Goals - 04/12/16 0944      PT SHORT TERM GOAL #1   Title verbalize understanding of CVA risk factors/warning signs (Target Date for all STGs: 04/22/16)     PT SHORT TERM GOAL #2   Title perform BERG with appropriate LTG to be written    Status Achieved     PT SHORT TERM GOAL #3   Title improve timed up and  go to < 27 sec for improved mobility     PT SHORT TERM GOAL #4   Title improve gait velocity to > 1.2 ft/sec for improved mobility     PT SHORT TERM GOAL #5   Title Will improve BERG balance score to >45/56 to decrease risk for falls            PT Long Term Goals - 04/17/16 2137      PT LONG TERM GOAL #1   Title independent with HEP (Target Date for all LTGs: 05/20/16)   Status On-going     PT LONG TERM GOAL #2   Title amb > 500' on various indoor/outdoor surfaces with LRAD (and possibly AFO) modified independent for improved community access   Status On-going     PT LONG TERM GOAL #3   Title improve timed up and go to < 20 sec for improved mobility    Status On-going     PT LONG TERM GOAL #4   Title improve  gait velocity to > 1.8 ft/sec for decreased fall risk and improved mobility   Status On-going               Plan - 04/17/16 2138    Clinical Impression Statement Patient was able to improve gait with Blue Rocker AFO, skilled instruction in upright posture, step thru pattern. Patient was able to engage left knee flexors with closed chain activities in parallel bars.    Rehab Potential Good   PT Frequency 2x / week   PT Duration 8 weeks   PT Treatment/Interventions ADLs/Self Care Home Management;Electrical Stimulation;Cryotherapy;Neuromuscular re-education;Balance training;Therapeutic exercise;Therapeutic activities;Functional mobility training;Stair training;Gait training;DME Instruction;Patient/family education;Orthotic Fit/Training;Vestibular;Taping   PT Next Visit Plan Continue gait with AFO - contact othotist for consult; add to HEP (only hip abduction and hip extension control exs have been given to date)    PT Home Exercise Plan see above   Consulted and Agree with Plan of Care Patient      Patient will benefit from skilled therapeutic intervention in order to improve the following deficits and impairments:  Abnormal gait, Decreased activity tolerance, Decreased balance, Decreased cognition, Decreased mobility, Difficulty walking, Decreased strength, Impaired tone, Decreased knowledge of precautions, Decreased knowledge of use of DME  Visit Diagnosis: Muscle weakness (generalized)  Other abnormalities of gait and mobility  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   Vladimir Faster PT, DPT 04/17/2016, 9:45 PM  [place] [facility] [address] [place], [place], [address] Phone: [phone]   Fax:  [phone]  Name: Adam Perez MRN: [id number] Date of Birth: 1955/05/11

## 2016-04-18 ENCOUNTER — Telehealth: Payer: Self-pay | Admitting: Physical Therapy

## 2016-04-18 ENCOUNTER — Encounter: Payer: Self-pay | Admitting: Physical Therapy

## 2016-04-18 ENCOUNTER — Encounter: Payer: Self-pay | Admitting: Speech Pathology

## 2016-04-18 ENCOUNTER — Ambulatory Visit: Payer: Managed Care, Other (non HMO) | Admitting: Physical Therapy

## 2016-04-18 ENCOUNTER — Encounter: Payer: Self-pay | Admitting: Occupational Therapy

## 2016-04-18 ENCOUNTER — Ambulatory Visit: Payer: Managed Care, Other (non HMO) | Admitting: Occupational Therapy

## 2016-04-18 VITALS — BP 134/86 | HR 64

## 2016-04-18 VITALS — BP 137/96 | HR 77

## 2016-04-18 DIAGNOSIS — R293 Abnormal posture: Secondary | ICD-10-CM

## 2016-04-18 DIAGNOSIS — I69315 Cognitive social or emotional deficit following cerebral infarction: Secondary | ICD-10-CM

## 2016-04-18 DIAGNOSIS — I679 Cerebrovascular disease, unspecified: Secondary | ICD-10-CM

## 2016-04-18 DIAGNOSIS — R2681 Unsteadiness on feet: Secondary | ICD-10-CM

## 2016-04-18 DIAGNOSIS — M6281 Muscle weakness (generalized): Secondary | ICD-10-CM

## 2016-04-18 DIAGNOSIS — M25612 Stiffness of left shoulder, not elsewhere classified: Secondary | ICD-10-CM

## 2016-04-18 DIAGNOSIS — I69352 Hemiplegia and hemiparesis following cerebral infarction affecting left dominant side: Secondary | ICD-10-CM

## 2016-04-18 DIAGNOSIS — M25622 Stiffness of left elbow, not elsewhere classified: Secondary | ICD-10-CM

## 2016-04-18 DIAGNOSIS — G8112 Spastic hemiplegia affecting left dominant side: Secondary | ICD-10-CM

## 2016-04-18 DIAGNOSIS — R2689 Other abnormalities of gait and mobility: Secondary | ICD-10-CM

## 2016-04-18 NOTE — Patient Instructions (Signed)
Hip Flexor Stretch    Lying on back near edge of bed, bend one leg, foot flat. Hang other leg over edge, relaxed, thigh resting entirely on bed for __2__ minutes. Repeat __1__ times. Do __2__ sessions per day. Advanced Exercise: Bend knee back keeping thigh in contact with bed.  http://gt2.exer.us/346   Copyright  VHI. All rights reserved.    Knee Roll    Lying on back, with knees bent and feet flat on floor, squeeze a pillow between your knees, arms outstretched to sides, slowly roll both knees to side, hold 5 seconds. Back to starting position, hold 5 seconds. Then to opposite side, hold 5 seconds. Return to starting position. Repeat 10 times.  Keep shoulders and arms in contact with floor.   Copyright  VHI. All rights reserved.   Bridge    Lie back, legs bent, squeeze pillow between knees. Inhale, pressing hips up-try to lift the left hip higher than the right. Keeping ribs in, lengthen lower back. Exhale, rolling down along spine from top. Repeat _10___ times. Do __2AROM: Lateral Neck Flexion    Slowly tilt head toward left shoulder.  Lower right shoulder.  Hold  _60___ seconds. Repeat __2__ times per set.  Do _3___ sessions per day.  HIP / KNEE: Extension - Sit to Stand    Sitting, lean chest forward, raise hips up from surface. Straighten hips and knees. Weight bear equally on left and right sides. Backs of legs should not push off surface. _10__ reps per set, _2__ sets per day, __7_ days per week Use assistive device as needed.  Copyright  VHI. All rights reserved.

## 2016-04-18 NOTE — Therapy (Signed)
[facility] [address] [place], [place], [address] Phone: [phone]   Fax:  [phone]  Occupational Therapy Treatment  Patient Details  Name: Adam Perez MRN: [id number] Date of Birth: 05-31-1955 Referring Provider: Dr. Renato GailsJohn Aguilar  Encounter Date: 04/18/2016      OT End of Session - 04/18/16 1200    Visit Number 6   Number of Visits 16   Date for OT Re-Evaluation 05/20/16   Authorization Type [employer], 60 PT/OT ,, no auth required.    OT Start Time 1040   OT Stop Time 1130   OT Time Calculation (min) 50 min   Activity Tolerance Patient tolerated treatment well      History reviewed. No pertinent past medical history.  History reviewed. No pertinent surgical history.  Vitals:   04/18/16 1035  BP: 134/86  Pulse: 64        Subjective Assessment - 04/18/16 1033    Subjective  they doubled my BP meds and it is much better.    Pertinent History see epic, care everywhere. Pt with R CVA, HTN,    Patient Stated Goals I want to walk normally and be able to hold a cup without dropping it   Currently in Pain? No/denies                      OT Treatments/Exercises (OP) - 04/18/16 1152      Neurological Re-education Exercises   Other Exercises 1 Neuro re ed to address trunk alignment, control and activation in supine progressed to side sitting with LUE in full weight bearing to activate proximal shoulder girdle, body on arm demand with reaching activity for RUE for forced paradaigm for LUE.  Pt able to maintain stability without assistance and control proximal stability with body moving on arm.  Progressed to standing and addressed LUE in weight bearing, active trunk, increased demand for LLE while stepping with RLE to create increased demand on L side.  Transitioned this into functional ambulation.  Pt with improving trunk alignment, proximal shoulder activation for postural control, balance and  functional mobility.                   OT Short Term Goals - 04/18/16 1156      OT SHORT TERM GOAL #1   Title Pt and family will be mod I with HEP - 04/22/2016   Status On-going     OT SHORT TERM GOAL #2   Title Pt will demonstrate ability for PROM of shoulder fleixon to 130* in supine with no pain to aide in self care and as prep for low functional reach.   Status Achieved  03/12/2016 150* in supine 0/10     OT SHORT TERM GOAL #3   Title Pt will be mod I with bathing   Status Achieved     OT SHORT TERM GOAL #4   Title Pt will be mod I with tub bench transfers   Status Achieved     OT SHORT TERM GOAL #5   Title Pt will be mod I with tying shoes using AE   Status Achieved           OT Long Term Goals - 04/18/16 1156      OT LONG TERM GOAL #1   Title Pt and family will be mod I with upgraded HEP- 05/20/2016   Status On-going     OT LONG TERM GOAL #2   Title  Pt will be mod I with simple familiar hot meal prep   Status On-going     OT LONG TERM GOAL #3   Title Pt will demonstrate ability to use LUE as stabilizer during basic ADL's 50% of the time.   Status On-going     OT LONG TERM GOAL #4   Title Pt will demonstrate ability to employ 2 strategies to reduce tone in LUE in order to improve ability to use LUE as stabilizer in basic ADL's   Status On-going               Plan - 04/18/16 1156    Clinical Impression Statement Pt progressing toward goals.  Improved L shoulder proximal stability, trunk activation and functional mobility   Rehab Potential Good   Clinical Impairments Affecting Rehab Potential decreased insight, severity of UE deficits   OT Frequency 2x / week   OT Duration 8 weeks   OT Treatment/Interventions Self-care/ADL training;Moist Heat;Electrical Stimulation;DME and/or AE instruction;Neuromuscular education;Therapeutic exercise;Functional Mobility Training;Manual Therapy;Passive range of motion;Splinting;Therapeutic activities;Balance  training;Cognitive remediation/compensation   Plan add to HEP for LUE, NMR for LUE/trunk and proximal stability, beginning shoulder flexion/low reach, mobility, manual therapy   Consulted and Agree with Plan of Care Patient      Patient will benefit from skilled therapeutic intervention in order to improve the following deficits and impairments:  Abnormal gait, Decreased activity tolerance, Decreased balance, Decreased cognition, Decreased knowledge of use of DME, Decreased mobility, Decreased range of motion, Decreased safety awareness, Difficulty walking, Decreased strength, Impaired UE functional use, Impaired tone  Visit Diagnosis: Muscle weakness (generalized)  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   Norton Pastel, OTR/L 04/18/2016, 12:03 PM  [facility] [address] [place], [place], [address] Phone: [phone]   Fax:  [phone]  Name: Adam Perez MRN: [id number] Date of Birth: [birth year]

## 2016-04-18 NOTE — Therapy (Signed)
[facility] [address] [place], [place], [address] Phone: [phone]   Fax:  [phone]  Physical Therapy Treatment  Patient Details  Name: Adam Perez MRN: [id number] Date of Birth: 1956/02/26 Referring Provider: Renato Gails, MD  Encounter Date: 04/18/2016      PT End of Session - 04/18/16 1032    Visit Number 6   Number of Visits 16   Date for PT Re-Evaluation 05/20/16   Authorization Type [employer]-60 visit limit combined PT/OT   PT Start Time 0934   PT Stop Time 1020   PT Time Calculation (min) 46 min   Activity Tolerance Patient tolerated treatment well   Behavior During Therapy [facility] for tasks assessed/performed      History reviewed. No pertinent past medical history.  History reviewed. No pertinent surgical history.  Vitals:   04/18/16 0941  BP: (!) 137/96  Pulse: 77        Subjective Assessment - 04/18/16 0942    Subjective Pt feeling good this morning; had a good session yesterday-"she got my leg to bend."   Limitations Standing;Walking;House hold activities   Patient Stated Goals improve mobility/walking, increase use of LUE for work   Currently in Pain? No/denies          [facility] Adult PT Treatment/Exercise - 04/18/16 1015      Neuro Re-ed    Neuro Re-ed Details  NMR during gait with blue rocker/heel wedge in L shoe for increased DF and foot clearance and RUE support on cane x 115' with therapist in front of pt supporting LUE and facilitation at trunk for anterior rotation and weight shift with pt demonstrating decreased genu recurvatum and improved gait speed.     Exercises   Exercises Lumbar;Neck;Knee/Hip     Neck Exercises: Seated   Other Seated Exercise R upper trapezius stretch-lateral flexion to L     Lumbar Exercises: Stretches   Lower Trunk Rotation 5 reps   Lower Trunk Rotation Limitations to each side, squeeze pillow between knees   Quad Stretch 1 rep;60 seconds  LLE   Quad Stretch Limitations seated with use of RLE for AAROM     Lumbar Exercises: Supine   Bridge 10 reps   Bridge Limitations bilat, squeezing pillow between knees-cues elevate L > R for increased activation     Knee/Hip Exercises: Stretches   Hip Flexor Stretch Left;1 rep;60 seconds   Hip Flexor Stretch Limitations LE off side of mat     Knee/Hip Exercises: Seated   Sit to Sand 10 reps;without UE support  with UE on LLE for increased WB/activation            PT Education - 04/18/16 1032    Education provided Yes   Education Details Continued education for HEP, use of AFO during gait   Person(s) Educated Patient   Methods Explanation;Demonstration;Handout   Comprehension Need further instruction;Verbal cues required;Tactile cues required          PT Short Term Goals - 04/12/16 0944      PT SHORT TERM GOAL #1   Title verbalize understanding of CVA risk factors/warning signs (Target Date for all STGs: 04/22/16)     PT SHORT TERM GOAL #2   Title perform BERG with appropriate LTG to be written    Status Achieved     PT SHORT TERM GOAL #3   Title improve timed up and go to < 27 sec for improved mobility     PT SHORT TERM  GOAL #4   Title improve gait velocity to > 1.2 ft/sec for improved mobility     PT SHORT TERM GOAL #5   Title Will improve BERG balance score to >45/56 to decrease risk for falls            PT Long Term Goals - 04/17/16 2137      PT LONG TERM GOAL #1   Title independent with HEP (Target Date for all LTGs: 05/20/16)   Status On-going     PT LONG TERM GOAL #2   Title amb > 500' on various indoor/outdoor surfaces with LRAD (and possibly AFO) modified independent for improved community access   Status On-going     PT LONG TERM GOAL #3   Title improve timed up and go to < 20 sec for improved mobility    Status On-going     PT LONG TERM GOAL #4   Title improve gait velocity to > 1.8 ft/sec for decreased fall risk and improved mobility   Status  On-going               Plan - 04/18/16 1033    Clinical Impression Statement Pt demonstrating improved sit > stand sequence with decreased lateral flexion and weight shift to R; continues to require cues for head righting in midline during mobility.  Pt also continues to require verbal and tactile cues during HEP for placement and activation of LLE; will continue to review HEP to ensure independence.  Will contact orthotist for AFO assessment and begin gait training with orthosis to improve safety and progress towards Mod I with gait.   Rehab Potential Good   Clinical Impairments Affecting Rehab Potential cognition, severity of deficits   PT Treatment/Interventions ADLs/Self Care Home Management;Electrical Stimulation;Cryotherapy;Neuromuscular re-education;Balance training;Therapeutic exercise;Therapeutic activities;Functional mobility training;Stair training;Gait training;DME Instruction;Patient/family education;Orthotic Fit/Training;Vestibular;Taping   PT Next Visit Plan NMR L side for closed chain activation and WB, forwards pelvis/trunk rotation on L during sit > stand and gait, gait with AFO   Consulted and Agree with Plan of Care Patient      Patient will benefit from skilled therapeutic intervention in order to improve the following deficits and impairments:  Abnormal gait, Decreased activity tolerance, Decreased balance, Decreased cognition, Decreased mobility, Difficulty walking, Decreased strength, Impaired tone, Decreased knowledge of precautions, Decreased knowledge of use of DME  Visit Diagnosis: Hemiplegia and hemiparesis following cerebral infarction affecting left dominant side (HCC)  Muscle weakness (generalized)  Other abnormalities of gait and mobility     Problem List There are no active problems to display for this patient.  Edman CircleAudra Hall, PT, DPT 04/18/16    10:39 AM   [place] [facility] [address] [place], [place], [address] Phone: [phone]   Fax:  [phone]  Name: Adam Perez MRN: [id number] Date of Birth: 06-02-1955

## 2016-04-18 NOTE — Telephone Encounter (Signed)
Entered in error  Edman CircleAudra Hall, PT, DPT 04/18/16    1:13 PM

## 2016-04-22 ENCOUNTER — Encounter: Payer: Self-pay | Admitting: Occupational Therapy

## 2016-04-22 ENCOUNTER — Ambulatory Visit: Payer: Managed Care, Other (non HMO) | Admitting: Occupational Therapy

## 2016-04-22 ENCOUNTER — Ambulatory Visit: Payer: Managed Care, Other (non HMO) | Admitting: Physical Therapy

## 2016-04-22 ENCOUNTER — Encounter: Payer: Self-pay | Admitting: Speech Pathology

## 2016-04-22 VITALS — BP 144/100 | HR 70

## 2016-04-22 VITALS — BP 159/98

## 2016-04-22 DIAGNOSIS — M25612 Stiffness of left shoulder, not elsewhere classified: Secondary | ICD-10-CM

## 2016-04-22 DIAGNOSIS — R293 Abnormal posture: Secondary | ICD-10-CM

## 2016-04-22 DIAGNOSIS — M25622 Stiffness of left elbow, not elsewhere classified: Secondary | ICD-10-CM

## 2016-04-22 DIAGNOSIS — M6281 Muscle weakness (generalized): Secondary | ICD-10-CM

## 2016-04-22 DIAGNOSIS — G8112 Spastic hemiplegia affecting left dominant side: Secondary | ICD-10-CM

## 2016-04-22 DIAGNOSIS — R2681 Unsteadiness on feet: Secondary | ICD-10-CM

## 2016-04-22 DIAGNOSIS — I679 Cerebrovascular disease, unspecified: Secondary | ICD-10-CM

## 2016-04-22 DIAGNOSIS — I69315 Cognitive social or emotional deficit following cerebral infarction: Secondary | ICD-10-CM

## 2016-04-22 DIAGNOSIS — I69352 Hemiplegia and hemiparesis following cerebral infarction affecting left dominant side: Secondary | ICD-10-CM

## 2016-04-22 DIAGNOSIS — R2689 Other abnormalities of gait and mobility: Secondary | ICD-10-CM

## 2016-04-22 NOTE — Therapy (Signed)
[facility] [address] [place], [place], [address] Phone: [phone]   Fax:  [phone]  Physical Therapy Treatment  Patient Details  Name: Adam Perez MRN: [id number] Date of Birth: 1955/08/26 Referring Provider: Renato GailsJohn Aguilar, MD  Encounter Date: 04/22/2016      PT End of Session - 04/22/16 1257    Visit Number 7   Number of Visits 16   Date for PT Re-Evaluation 05/20/16   Authorization Type [employer]-60 visit limit combined PT/OT   PT Start Time 1015   PT Stop Time 1055   PT Time Calculation (min) 40 min   Equipment Utilized During Treatment Gait belt   Activity Tolerance Patient tolerated treatment well   Behavior [facility] Therapy [facility] for tasks assessed/performed      No past medical history on file.  No past surgical history on file.  Vitals:   04/22/16 1044 04/22/16 1054  BP: (!) 153/99 (!) 159/98        Subjective Assessment - 04/22/16 1018    Subjective doing well; no pain.  no falls   Pertinent History HTN, PTSD   Limitations Standing;Walking;House hold activities   Patient Stated Goals improve mobility/walking, increase use of LUE for work   Currently in Pain? No/denies                         [facility] Treatment/Exercise - 04/22/16 1037      Ambulation/Gait   Gait Comments amb with orthotist present to assess for appropriate AFO: amb without AFO followed by amb with [place] AFO with heel wedge.  Improved foot clearance but continued recurvatum noted.  Orthotist recommended Spry AFO for LLE to help control recurvatum.  Feel pt will benefit from AFO to improve foot clearance and decrease fall risk.     Knee/Hip Exercises: Seated   Sit to Sand 10 reps;without UE support  RLE on 4" block for LLE weight bearing     Knee/Hip Exercises: Supine   Bridges Strengthening;10 reps;2 sets   Bridges Limitations cues for L glute activation   Single Leg Bridge Left;5 reps                   PT Short Term Goals - 04/12/16 0944      PT SHORT TERM GOAL #1   Title verbalize understanding of CVA risk factors/warning signs (Target Date for all STGs: 04/22/16)     PT SHORT TERM GOAL #2   Title perform BERG with appropriate LTG to be written    Status Achieved     PT SHORT TERM GOAL #3   Title improve timed up and go to < 27 sec for improved mobility     PT SHORT TERM GOAL #4   Title improve gait velocity to > 1.2 ft/sec for improved mobility     PT SHORT TERM GOAL #5   Title Will improve BERG balance score to >45/56 to decrease risk for falls            PT Long Term Goals - 04/17/16 2137      PT LONG TERM GOAL #1   Title independent with HEP (Target Date for all LTGs: 05/20/16)   Status On-going     PT LONG TERM GOAL #2   Title amb > 500' on various indoor/outdoor surfaces with LRAD (and possibly AFO) modified independent for improved community access   Status On-going     PT LONG  TERM GOAL #3   Title improve timed up and go to < 20 sec for improved mobility    Status On-going     PT LONG TERM GOAL #4   Title improve gait velocity to > 1.8 ft/sec for decreased fall risk and improved mobility   Status On-going               Plan - 04/22/16 1257    Clinical Impression Statement Session today focused on assessment of appropriate AFO with orthotist present.  BP elevated so rest breaks needed and tx limited.  Orthotist recommended Spry L AFO the help control foot drop and recurvatum.  Will continue to benefit from PT to maximize function.   PT Treatment/Interventions ADLs/Self Care Home Management;Electrical Stimulation;Cryotherapy;Neuromuscular re-education;Balance training;Therapeutic exercise;Therapeutic activities;Functional mobility training;Stair training;Gait training;DME Instruction;Patient/family education;Orthotic Fit/Training;Vestibular;Taping   PT Next Visit Plan check STGs, continue gait, LLE weight bearing   Consulted and  Agree with Plan of Care Patient      Patient will benefit from skilled therapeutic intervention in order to improve the following deficits and impairments:  Abnormal gait, Decreased activity tolerance, Decreased balance, Decreased cognition, Decreased mobility, Difficulty walking, Decreased strength, Impaired tone, Decreased knowledge of precautions, Decreased knowledge of use of DME  Visit Diagnosis: Hemiplegia and hemiparesis following cerebral infarction affecting left dominant side (HCC)  Muscle weakness (generalized)  Other abnormalities of gait and mobility     Problem List There are no active problems to display for this patient.     Clarita Crane, PT, DPT 04/22/16 1:00 PM    [facility] [address] [place], [place], [address] Phone: [phone]   Fax:  [phone]  Name: Adam Perez MRN: [id number] Date of Birth: 1955-09-05

## 2016-04-22 NOTE — Patient Instructions (Signed)
Pt reported that he "didnt' have time to take his BP meds this am."  Educated pt again on stroke risks including uncontrolled BP, and prior stroke. Discussed that pt is at high risk for further stroke.  Pt only takes BP meds 1x/day therefore pt had not taking BP meds since yesterday at 6 am.  Pt encouraged to return home and take meds asap. Pt verbalized understanding and in agreement.

## 2016-04-22 NOTE — Therapy (Signed)
[facility] [address] [place], [place], [address] Phone: [phone]   Fax:  [phone]  Occupational Therapy Treatment  Patient Details  Name: Adam Perez MRN: [id number] Date of Birth: Oct 17, 1955 Referring Provider: Dr. Renato Gails  Encounter Date: 04/22/2016      OT End of Session - 04/22/16 1123    Visit Number 6   Number of Visits 16   Date for OT Re-Evaluation 05/20/16   Authorization Type [employer], 60 PT/OT ,, no auth required.    OT Start Time 1101   OT Stop Time 1115   OT Time Calculation (min) 14 min   Activity Tolerance Treatment limited secondary to medical complications (Comment)  session terminated due to elevated BP      History reviewed. No pertinent past medical history.  History reviewed. No pertinent surgical history.  Vitals:   04/22/16 1103  BP: (!) 144/100  Pulse: 70        Subjective Assessment - 04/22/16 1105    Subjective  I didn't have time to take my BP medication   Pertinent History see epic, care everywhere. Pt with R CVA, HTN,    Patient Stated Goals I want to walk normally and be able to hold a cup without dropping it   Currently in Pain? No/denies         Pt with elevated BP at end of PT session; after 5 minute rest, BP higher (144/100);  Waited another 5 minutes BP 147/101.  Pt reported that he "didn't have time this morning to take my  BP meds."  Educated pt again on stroke risks including prior stroke and uncontrolled BP.  Pt had last taken medications yesterday at 6 am therefore had has no BP meds for almost 18 hours.  Recommended that pt return home and take BP meds asap.  Pt verbalized understanding and agreement. Session terminated due to BP and amount of time pt had gone without meds.                       OT Education - 04/22/16 1121    Education provided Yes   Education Details compliance with BP meds, stroke risks   Person(s) Educated  Patient   Methods Explanation   Comprehension Verbalized understanding          OT Short Term Goals - 04/22/16 1122      OT SHORT TERM GOAL #1   Title Pt and family will be mod I with HEP - 04/22/2016   Status Achieved     OT SHORT TERM GOAL #2   Title Pt will demonstrate ability for PROM of shoulder fleixon to 130* in supine with no pain to aide in self care and as prep for low functional reach.   Status Achieved  03/12/2016 150* in supine 0/10     OT SHORT TERM GOAL #3   Title Pt will be mod I with bathing   Status Achieved     OT SHORT TERM GOAL #4   Title Pt will be mod I with tub bench transfers   Status Achieved     OT SHORT TERM GOAL #5   Title Pt will be mod I with tying shoes using AE   Status Achieved           OT Long Term Goals - 04/22/16 1122      OT LONG TERM GOAL #1   Title Pt and family will be mod  I with upgraded HEP- 05/20/2016   Status On-going     OT LONG TERM GOAL #2   Title Pt will be mod I with simple familiar hot meal prep   Status On-going     OT LONG TERM GOAL #3   Title Pt will demonstrate ability to use LUE as stabilizer during basic ADL's 50% of the time.   Status On-going     OT LONG TERM GOAL #4   Title Pt will demonstrate ability to employ 2 strategies to reduce tone in LUE in order to improve ability to use LUE as stabilizer in basic ADL's   Status On-going               Plan - 04/22/16 1122    Clinical Impression Statement Pt progressing toward goals however was not compliant with BP meds this am therefore session was terminated after education regarding stroke risks   Rehab Potential Good   Clinical Impairments Affecting Rehab Potential decreased insight, severity of UE deficits   OT Frequency 2x / week   OT Duration 8 weeks   OT Treatment/Interventions Self-care/ADL training;Moist Heat;Electrical Stimulation;DME and/or AE instruction;Neuromuscular education;Therapeutic exercise;Functional Mobility Training;Manual  Therapy;Passive range of motion;Splinting;Therapeutic activities;Balance training;Cognitive remediation/compensation   Plan CHECK BP!!, add to HEP for LUE, NMR lUE/trunk and proximal stability, beginnng shoulder flexion/low reach, mobility, manual therapy   Consulted and Agree with Plan of Care Patient      Patient will benefit from skilled therapeutic intervention in order to improve the following deficits and impairments:  Abnormal gait, Decreased activity tolerance, Decreased balance, Decreased cognition, Decreased knowledge of use of DME, Decreased mobility, Decreased range of motion, Decreased safety awareness, Difficulty walking, Decreased strength, Impaired UE functional use, Impaired tone  Visit Diagnosis: Muscle weakness (generalized)  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   Norton Pastelulaski, Kaedin Hicklin Halliday, OTR/L 04/22/2016, 11:25 AM  [facility] [address] [place], [place], [address] Phone: [phone]   Fax:  [phone]  Name: Adam AnnaDonald L Perez MRN: [id number] Date of Birth: 10/05/1955

## 2016-04-23 ENCOUNTER — Ambulatory Visit: Payer: Managed Care, Other (non HMO) | Admitting: Physical Therapy

## 2016-04-23 ENCOUNTER — Encounter: Payer: Self-pay | Admitting: *Deleted

## 2016-04-23 ENCOUNTER — Encounter: Payer: Self-pay | Admitting: Occupational Therapy

## 2016-04-23 ENCOUNTER — Ambulatory Visit: Payer: Managed Care, Other (non HMO) | Admitting: Occupational Therapy

## 2016-04-23 VITALS — BP 148/83

## 2016-04-23 DIAGNOSIS — R293 Abnormal posture: Secondary | ICD-10-CM

## 2016-04-23 DIAGNOSIS — I69352 Hemiplegia and hemiparesis following cerebral infarction affecting left dominant side: Secondary | ICD-10-CM

## 2016-04-23 DIAGNOSIS — I679 Cerebrovascular disease, unspecified: Secondary | ICD-10-CM

## 2016-04-23 DIAGNOSIS — I69315 Cognitive social or emotional deficit following cerebral infarction: Secondary | ICD-10-CM

## 2016-04-23 DIAGNOSIS — R2689 Other abnormalities of gait and mobility: Secondary | ICD-10-CM

## 2016-04-23 DIAGNOSIS — M6281 Muscle weakness (generalized): Secondary | ICD-10-CM

## 2016-04-23 DIAGNOSIS — M25622 Stiffness of left elbow, not elsewhere classified: Secondary | ICD-10-CM

## 2016-04-23 DIAGNOSIS — R2681 Unsteadiness on feet: Secondary | ICD-10-CM

## 2016-04-23 DIAGNOSIS — M25612 Stiffness of left shoulder, not elsewhere classified: Secondary | ICD-10-CM

## 2016-04-23 DIAGNOSIS — G8112 Spastic hemiplegia affecting left dominant side: Secondary | ICD-10-CM

## 2016-04-23 NOTE — Therapy (Signed)
[facility] [address] [place], [place], [address] Phone: [phone]   Fax:  [phone]  Occupational Therapy Treatment  Patient Details  Name: Adam Perez MRN: [id number] Date of Birth: Jun 06, 1955 Referring Provider: Dr. Renato GailsJohn Aguilar  Encounter Date: 04/23/2016      OT End of Session - 04/23/16 1253    Visit Number 7   Number of Visits 16   Date for OT Re-Evaluation 05/20/16   Authorization Type [employer], 60 PT/OT ,, no auth required.    OT Start Time 1101   OT Stop Time 1144   OT Time Calculation (min) 43 min   Activity Tolerance Patient tolerated treatment well      History reviewed. No pertinent past medical history.  History reviewed. No pertinent surgical history.  There were no vitals filed for this visit.      Subjective Assessment - 04/23/16 1101    Subjective  I took my BP meds and the PT said my pressure was good.    Pertinent History see epic, care everywhere. Pt with R CVA, HTN,    Patient Stated Goals I want to walk normally and be able to hold a cup without dropping it   Currently in Pain? No/denies                      OT Treatments/Exercises (OP) - 04/23/16 0001      Splinting   Splinting Pt with significant spasticity in LUE and developing mild contracture in elbow - fabricated elbow splint to faciliate extension in L elbow and reduce tone with prolonged long stretch.  Will need to complete splint next session and educate pt on wear and care. Pt reports his dtr can don splint at night before he goes to bed.                 OT Education - 04/22/16 1121    Education provided Yes   Education Details compliance with BP meds, stroke risks   Person(s) Educated Patient   Methods Explanation   Comprehension Verbalized understanding          OT Short Term Goals - 04/23/16 1251      OT SHORT TERM GOAL #1   Title Pt and family will be mod I with HEP - 04/22/2016    Status Achieved     OT SHORT TERM GOAL #2   Title Pt will demonstrate ability for PROM of shoulder fleixon to 130* in supine with no pain to aide in self care and as prep for low functional reach.   Status Achieved  03/12/2016 150* in supine 0/10     OT SHORT TERM GOAL #3   Title Pt will be mod I with bathing   Status Achieved     OT SHORT TERM GOAL #4   Title Pt will be mod I with tub bench transfers   Status Achieved     OT SHORT TERM GOAL #5   Title Pt will be mod I with tying shoes using AE   Status Achieved           OT Long Term Goals - 04/23/16 1251      OT LONG TERM GOAL #1   Title Pt and family will be mod I with upgraded HEP- 05/20/2016   Status On-going     OT LONG TERM GOAL #2   Title Pt will be mod I with simple familiar hot meal prep  Status On-going     OT LONG TERM GOAL #3   Title Pt will demonstrate ability to use LUE as stabilizer during basic ADL's 50% of the time.   Status On-going     OT LONG TERM GOAL #4   Title Pt will demonstrate ability to employ 2 strategies to reduce tone in LUE in order to improve ability to use LUE as stabilizer in basic ADL's   Status On-going               Plan - 04/23/16 1251    Clinical Impression Statement Pt progressing toward gaols.  Pt's BP WNL;s today.  Pt improving in balance and functional mobility.    Rehab Potential Good   Clinical Impairments Affecting Rehab Potential decreased insight, severity of UE deficits   OT Frequency 2x / week   OT Duration 8 weeks   OT Treatment/Interventions Self-care/ADL training;Moist Heat;Electrical Stimulation;DME and/or AE instruction;Neuromuscular education;Therapeutic exercise;Functional Mobility Training;Manual Therapy;Passive range of motion;Splinting;Therapeutic activities;Balance training;Cognitive remediation/compensation   Plan complete splint, add to HEP for LUE, NMR for LUE/trunk and proximal stability, low reach, manual therapy   Consulted and Agree with  Plan of Care Patient      Patient will benefit from skilled therapeutic intervention in order to improve the following deficits and impairments:  Abnormal gait, Decreased activity tolerance, Decreased balance, Decreased cognition, Decreased knowledge of use of DME, Decreased mobility, Decreased range of motion, Decreased safety awareness, Difficulty walking, Decreased strength, Impaired UE functional use, Impaired tone  Visit Diagnosis: Muscle weakness (generalized)  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   Adam Perez, OTR/L 04/23/2016, 12:54 PM  [facility] [address] [place], [place], [address] Phone: [phone]   Fax:  [phone]  Name: Adam Perez MRN: [id number] Date of Birth: 01/24/1956

## 2016-04-23 NOTE — Therapy (Signed)
[facility] [address] [place], [place], [address] Phone: [phone]   Fax:  [phone]  Physical Therapy Treatment  Patient Details  Name: Adam Perez MRN: [id number] Date of Birth: [birth year] Referring Provider: Levada Schilling, MD  Encounter Date: 04/23/2016      PT End of Session - 04/23/16 1258    Visit Number 8   Number of Visits 16   Date for PT Re-Evaluation 05/20/16   Authorization Type [employer]-60 visit limit combined PT/OT   PT Start Time 1015   PT Stop Time 1056   PT Time Calculation (min) 41 min   Equipment Utilized During Treatment Gait belt   Activity Tolerance Patient tolerated treatment well   Behavior During Therapy [facility] for tasks assessed/performed      No past medical history on file.  No past surgical history on file.  Vitals:   04/23/16 1018 04/23/16 1044  BP: (!) 152/88 (!) 148/83        Subjective Assessment - 04/23/16 1018    Subjective took BP meds today; doing well   Currently in Pain? No/denies            [facility] PT Assessment - 04/23/16 1034      Ambulation/Gait   Gait velocity 1.45 ft/sec  22.56 sec     Standardized Balance Assessment   Standardized Balance Assessment Timed Up and Go Test     Timed Up and Go Test   Normal TUG (seconds) 25.6                     [facility] Adult PT Treatment/Exercise - 04/23/16 1034      Ambulation/Gait   Ambulation/Gait Yes   Ambulation/Gait Assistance 4: Min guard   Ambulation Distance (Feet) 75 Feet   Assistive device Small based quad cane   Gait Pattern Step-to pattern;Decreased stance time - left;Decreased step length - right;Decreased hip/knee flexion - left;Decreased dorsiflexion - left;Left circumduction;Left hip hike   Ambulation Surface Level;Indoor     Berg Balance Test   Sit to Stand Able to stand  independently using hands   Standing Unsupported Able to stand safely 2 minutes   Sitting with Back  Unsupported but Feet Supported on Floor or Stool Able to sit safely and securely 2 minutes   Stand to Sit Controls descent by using hands   Transfers Able to transfer safely, minor use of hands   Standing Unsupported with Eyes Closed Able to stand 10 seconds safely   Standing Ubsupported with Feet Together Able to place feet together independently and stand 1 minute safely   From Standing, Reach Forward with Outstretched Arm Can reach forward >12 cm safely (5")   From Standing Position, Pick up Object from Floor Able to pick up shoe, needs supervision   From Standing Position, Turn to Look Behind Over each Shoulder Looks behind from both sides and weight shifts well   Turn 360 Degrees Needs close supervision or verbal cueing   Standing Unsupported, Alternately Place Feet on Step/Stool Able to complete >2 steps/needs minimal assist   Standing Unsupported, One Foot in Front Able to take small step independently and hold 30 seconds   Standing on One Leg Tries to lift leg/unable to hold 3 seconds but remains standing independently   Total Score 41                PT Education - 04/23/16 1257    Education provided Yes   Education Details  current progress   Person(s) Educated Patient   Methods Explanation   Comprehension Verbalized understanding          PT Short Term Goals - 04/23/16 1258      PT SHORT TERM GOAL #1   Title verbalize understanding of CVA risk factors/warning signs (Target Date for all STGs: 04/22/16)   Baseline achieved per OT note 04/22/16   Status Achieved     PT SHORT TERM GOAL #2   Title perform BERG with appropriate LTG to be written    Status Achieved     PT SHORT TERM GOAL #3   Title improve timed up and go to < 27 sec for improved mobility   Status Achieved     PT SHORT TERM GOAL #4   Title improve gait velocity to > 1.2 ft/sec for improved mobility   Status Achieved     PT SHORT TERM GOAL #5   Title Will improve BERG balance score to >45/56 to  decrease risk for falls    Baseline 04/22/16: 41/56   Status Not Met           PT Long Term Goals - 04/17/16 2137      PT LONG TERM GOAL #1   Title independent with HEP (Target Date for all LTGs: 05/20/16)   Status On-going     PT LONG TERM GOAL #2   Title amb > 500' on various indoor/outdoor surfaces with LRAD (and possibly AFO) modified independent for improved community access   Status On-going     PT LONG TERM GOAL #3   Title improve timed up and go to < 20 sec for improved mobility    Status On-going     PT LONG TERM GOAL #4   Title improve gait velocity to > 1.8 ft/sec for decreased fall risk and improved mobility   Status On-going               Plan - 04/23/16 1259    Clinical Impression Statement Pt has met all STGs except BERG goal slightly improved from 39/56 to 41/56.  Pt needed frequent redirection today during session and requested rest breaks due to fatigue.  Utilized L AFO today during session with improved mobility.  Will continue to benefit from PT to address deficits and progress towards LTGs.   Clinical Impairments Affecting Rehab Potential cognition, severity of deficits   PT Treatment/Interventions ADLs/Self Care Home Management;Electrical Stimulation;Cryotherapy;Neuromuscular re-education;Balance training;Therapeutic exercise;Therapeutic activities;Functional mobility training;Stair training;Gait training;DME Instruction;Patient/family education;Orthotic Fit/Training;Vestibular;Taping   PT Next Visit Plan continue gait, LLE weight bearing   Consulted and Agree with Plan of Care Patient      Patient will benefit from skilled therapeutic intervention in order to improve the following deficits and impairments:  Abnormal gait, Decreased activity tolerance, Decreased balance, Decreased cognition, Decreased mobility, Difficulty walking, Decreased strength, Impaired tone, Decreased knowledge of precautions, Decreased knowledge of use of DME  Visit  Diagnosis: Hemiplegia and hemiparesis following cerebral infarction affecting left dominant side (HCC)  Other abnormalities of gait and mobility  Muscle weakness (generalized)     Problem List There are no active problems to display for this patient.     Laureen Abrahams, PT, DPT 04/23/16 1:02 PM    [facility] [address] [place], [place], [address] Phone: [phone]   Fax:  [phone]  Name: Adam Perez MRN: [id number] Date of Birth: 1956-03-07

## 2016-04-30 ENCOUNTER — Encounter: Payer: Self-pay | Admitting: Speech Pathology

## 2016-04-30 ENCOUNTER — Encounter: Payer: Self-pay | Admitting: Physical Therapy

## 2016-04-30 ENCOUNTER — Ambulatory Visit: Payer: Managed Care, Other (non HMO) | Admitting: Physical Therapy

## 2016-04-30 ENCOUNTER — Ambulatory Visit: Payer: Managed Care, Other (non HMO) | Admitting: Occupational Therapy

## 2016-04-30 ENCOUNTER — Encounter: Payer: Self-pay | Admitting: Occupational Therapy

## 2016-04-30 VITALS — BP 152/94 | HR 65

## 2016-04-30 DIAGNOSIS — I679 Cerebrovascular disease, unspecified: Secondary | ICD-10-CM

## 2016-04-30 DIAGNOSIS — G8112 Spastic hemiplegia affecting left dominant side: Secondary | ICD-10-CM

## 2016-04-30 DIAGNOSIS — I69315 Cognitive social or emotional deficit following cerebral infarction: Secondary | ICD-10-CM

## 2016-04-30 DIAGNOSIS — R2681 Unsteadiness on feet: Secondary | ICD-10-CM

## 2016-04-30 DIAGNOSIS — M6281 Muscle weakness (generalized): Secondary | ICD-10-CM

## 2016-04-30 DIAGNOSIS — R293 Abnormal posture: Secondary | ICD-10-CM

## 2016-04-30 DIAGNOSIS — M25622 Stiffness of left elbow, not elsewhere classified: Secondary | ICD-10-CM

## 2016-04-30 DIAGNOSIS — M25612 Stiffness of left shoulder, not elsewhere classified: Secondary | ICD-10-CM

## 2016-04-30 DIAGNOSIS — R2689 Other abnormalities of gait and mobility: Secondary | ICD-10-CM

## 2016-04-30 NOTE — Patient Instructions (Signed)
Your Splint This splint should initially be fitted by a healthcare practitioner.  The healthcare practitioner is responsible for providing wearing instructions and precautions to the patient, other healthcare practitioners and care provider involved in the patient's care.  This splint was custom made for you. Please read the following instructions to learn about wearing and caring for your splint.  Precautions Should your splint cause any of the following problems, remove the splint immediately and contact your therapist/physician.  Swelling  Severe Pain  Pressure Areas  Stiffness  Numbness  IF YOU HAVE ANY PROBLEMS WITH THE SPLINT STOP WEARING, MAKE A NOTE OF WHERE THE ISSUE IS, AND BRING IT TO THERAPY.  I will adjust it for you as needed.  Do not wear your splint while operating machinery unless it has been fabricated for that purpose.   When To Wear Your Splint Where your splint according to your therapist/physician instructions.   Day One:  Today:  Wear 2 hours this afternoon and then 2 hours this evening - DO NOT SLEEP IN THIS TONIGHT.  If there are NO problems then:  Day Two:  Wednesday:  Wear 4 hours in the morning and then 4 hours in the late afternoon - DO NOT SLEEP IN THIS TOMORROW NIGHT.  If there are no problems then:  Day Three:  Thursday:  You may wear all night.  You will ONLY wear this at night when sleeping at this point.  DO NOT WEAR DURING THE DAY from this point forward.  Make sure you still elevate your arm on a pillow when you sleep to prevent swelling!!     Care and Cleaning of Your Splint 1. Keep your splint away from open flames. 2. Your splint will lose its shape in temperatures over 135 degrees Farenheit, ( in car windows, near radiators, ovens or in hot water).  Never make any adjustments to your splint, if the splint needs adjusting remove it and make an appointment to see your therapist. 3. Your splint, including the cushion liner may be cleaned with  soap and lukewarm water.  Do not immerse in hot water over 135 degrees Farenheit. 4. Straps may be washed with soap and water, but do not moisten the self-adhesive portion. 5. For ink or hard to remove spots use a scouring cleanser which contains chlorine.  Rinse the splint thoroughly after using chlorine cleanser.

## 2016-04-30 NOTE — Therapy (Signed)
[facility] [address] [place], [place], [address] Phone: [phone]   Fax:  [phone]  Occupational Therapy Treatment  Patient Details  Name: Adam Perez MRN: [id number] Date of Birth: 1956-01-31 Referring Provider: Dr. Renato Gails  Encounter Date: 04/30/2016      OT End of Session - 04/30/16 1042    Visit Number 8   Number of Visits 16   Date for OT Re-Evaluation 05/20/16   Authorization Type [employer], 60 PT/OT ,, no auth required.    Authorization - Visit Number 8   Authorization - Number of Visits 30   OT Start Time 0931   OT Stop Time 1013   OT Time Calculation (min) 42 min      History reviewed. No pertinent past medical history.  History reviewed. No pertinent surgical history.  There were no vitals filed for this visit.      Subjective Assessment - 04/30/16 0935    Subjective  I think I need to apply for long term disability   Pertinent History see epic, care everywhere. Pt with R CVA, HTN,    Patient Stated Goals I want to walk normally and be able to hold a cup without dropping it   Currently in Pain? No/denies                      OT Treatments/Exercises (OP) - 04/30/16 0001      ADLs   ADL Comments Reviewed referral to local physiatrist for assessment as candidate for botox injection for LUE - discussed rationale and that MD will make decision about whether pt is a candidate.  Also discussed use of remaining therapy visits as pt does have visit limits and will want to maximize benefit of OT for LUE.  Pt in agreement.      Splinting   Splinting Completed elbow splint to address prolonged stretch at night to decrease tone, decrease pain with elbow extension, adddress, and address ROM.  Pt needs signficant review for wear and care of splint.  Pt reports dtr can assist with donning, doffing and care of splint.  Pt able to verbalize step by step information about wear and care.   Pt is aware that if any issues develop that he should stop wearing the splint and bring splint back into therapist for adjustment.  See pt instruction section for pt education.                  OT Education - 04/30/16 1040    Education provided Yes   Education Details splint wear and care   Person(s) Educated Patient   Methods Explanation;Demonstration;Verbal cues;Handout   Comprehension Verbalized understanding;Returned demonstration  dtr to assist with donning and monitoring per pt          OT Short Term Goals - 04/30/16 1040      OT SHORT TERM GOAL #1   Title Pt and family will be mod I with HEP - 04/22/2016   Status Achieved     OT SHORT TERM GOAL #2   Title Pt will demonstrate ability for PROM of shoulder fleixon to 130* in supine with no pain to aide in self care and as prep for low functional reach.   Status Achieved  03/12/2016 150* in supine 0/10     OT SHORT TERM GOAL #3   Title Pt will be mod I with bathing   Status Achieved     OT SHORT TERM GOAL #  4   Title Pt will be mod I with tub bench transfers   Status Achieved     OT SHORT TERM GOAL #5   Title Pt will be mod I with tying shoes using AE   Status Achieved           OT Long Term Goals - 04/30/16 1040      OT LONG TERM GOAL #1   Title Pt and family will be mod I with upgraded HEP- 05/20/2016   Status On-going     OT LONG TERM GOAL #2   Title Pt will be mod I with simple familiar hot meal prep   Status On-going     OT LONG TERM GOAL #3   Title Pt will demonstrate ability to use LUE as stabilizer during basic ADL's 50% of the time.   Status On-going     OT LONG TERM GOAL #4   Title Pt will demonstrate ability to employ 2 strategies to reduce tone in LUE in order to improve ability to use LUE as stabilizer in basic ADL's   Status On-going               Plan - 04/30/16 1041    Clinical Impression Statement Pt progressing toward goal.  Pt has appt with local physiatrist on 05/23/2016  for possible botox injection for LUE.  Pt verbalized understanding and in agreement.   Rehab Potential Good   Clinical Impairments Affecting Rehab Potential decreased insight, severity of UE deficits   OT Frequency 2x / week   OT Duration 8 weeks   OT Treatment/Interventions Self-care/ADL training;Moist Heat;Electrical Stimulation;DME and/or AE instruction;Neuromuscular education;Therapeutic exercise;Functional Mobility Training;Manual Therapy;Passive range of motion;Splinting;Therapeutic activities;Balance training;Cognitive remediation/compensation   Plan check in for splint, add to HEP for lUE, NMR for LUE/trunk and proximal stability, low reach, manual therapy   Consulted and Agree with Plan of Care Patient      Patient will benefit from skilled therapeutic intervention in order to improve the following deficits and impairments:  Abnormal gait, Decreased activity tolerance, Decreased balance, Decreased cognition, Decreased knowledge of use of DME, Decreased mobility, Decreased range of motion, Decreased safety awareness, Difficulty walking, Decreased strength, Impaired UE functional use, Impaired tone  Visit Diagnosis: Muscle weakness (generalized)  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   Norton Pastelulaski, Clary Boulais Halliday, OTR/L 04/30/2016, 10:44 AM  [facility] [address] [place], [place], [address] Phone: [phone]   Fax:  [phone]  Name: Adam Perez MRN: [id number] Date of Birth: 1955-03-13

## 2016-05-01 NOTE — Therapy (Signed)
[place] [address] [place], [place], [address] Phone: [phone]   Fax:  [phone]  Physical Therapy Treatment  Patient Details  Name: Adam Perez MRN: [id number] Date of Birth: 11-21-1955 Referring Provider: Levada Schilling, MD  Encounter Date: 04/30/2016      PT End of Session - 04/30/16 1020    Visit Number 9   Number of Visits 16   Date for PT Re-Evaluation 05/20/16   Authorization Type [employer]-60 visit limit combined PT/OT   PT Start Time 1017   PT Stop Time 1100   PT Time Calculation (min) 43 min   Equipment Utilized During Treatment Gait belt   Activity Tolerance Patient tolerated treatment well   Behavior During Therapy [facility] for tasks assessed/performed      History reviewed. No pertinent past medical history.  History reviewed. No pertinent surgical history.  Vitals:   04/30/16 1019 04/30/16 1041  BP: 138/87 (!) 152/94  Pulse: 68 65        Subjective Assessment - 04/30/16 1019    Subjective No new complaints. No falls to report or pain to report. Did take his BP medication this am.   Patient is accompained by: Family member   Pertinent History HTN, PTSD   Limitations Standing;Walking;House hold activities   Patient Stated Goals improve mobility/walking, increase use of LUE for work   Currently in Pain? No/denies   Pain Score 0-No pain            [facility] Adult PT Treatment/Exercise - 04/30/16 1026      Transfers   Transfers Sit to Stand;Stand to [employer] Transfers   Sit to Stand 5: Supervision;4: Min assist   Sit to Stand Details Verbal cues for technique;Manual facilitation for weight shifting   Stand to Sit 5: Supervision;4: Min assist   Stand to Sit Details (indicate cue type and reason) Verbal cues for technique;Manual facilitation for weight shifting   Number of Reps 10 reps;2 sets   Comments with 4 inch box under right foot for increased left LE weight  bearing/activation with transfers, cues to maintain body position in neutral, not lean toward right side      Neuro Re-ed    Neuro Re-ed Details  standing with cane support: right foot on 4 inch box- left knee bending/straightening x 10 reps with manual assist to prevent buckling/recurvatum; using cane for balance- right fwd/bwd stepping over black half foam roll x 10 reps with stabilization to left knee as needed; tall kneeling with right UE on blue bench- mini squats x 10 reps with emphasis on bil LE equal weight bearing.                         Knee/Hip Exercises: Supine   Bridges Strengthening;10 reps;2 sets   Bridges Limitations with 3 sec holds and cues on increased glut activation, cues to lift as high as possible with each bridge; right foot propped on stool for single leg bridges                Single Leg Bridge AROM;Strengthening;Left;2 sets;10 reps;Limitations             PT Short Term Goals - 04/23/16 1258      PT SHORT TERM GOAL #1   Title verbalize understanding of CVA risk factors/warning signs (Target Date for all STGs: 04/22/16)   Baseline achieved per OT note 04/22/16   Status Achieved     PT  SHORT TERM GOAL #2   Title perform BERG with appropriate LTG to be written    Status Achieved     PT SHORT TERM GOAL #3   Title improve timed up and go to < 27 sec for improved mobility   Status Achieved     PT SHORT TERM GOAL #4   Title improve gait velocity to > 1.2 ft/sec for improved mobility   Status Achieved     PT SHORT TERM GOAL #5   Title Will improve BERG balance score to >45/56 to decrease risk for falls    Baseline 04/22/16: 41/56   Status Not Met           PT Long Term Goals - 04/17/16 2137      PT LONG TERM GOAL #1   Title independent with HEP (Target Date for all LTGs: 05/20/16)   Status On-going     PT LONG TERM GOAL #2   Title amb > 500' on various indoor/outdoor surfaces with LRAD (and possibly AFO) modified independent for improved community  access   Status On-going     PT LONG TERM GOAL #3   Title improve timed up and go to < 20 sec for improved mobility    Status On-going     PT LONG TERM GOAL #4   Title improve gait velocity to > 1.8 ft/sec for decreased fall risk and improved mobility   Status On-going            Plan - 04/30/16 1020    Clinical Impression Statement Today's skilled session focused on strengthening with no issues reported during session. Pt is making steady progress toward goals and should benefit from continued PT to progress toward unmet goals.   Clinical Impairments Affecting Rehab Potential cognition, severity of deficits   PT Treatment/Interventions ADLs/Self Care Home Management;Electrical Stimulation;Cryotherapy;Neuromuscular re-education;Balance training;Therapeutic exercise;Therapeutic activities;Functional mobility training;Stair training;Gait training;DME Instruction;Patient/family education;Orthotic Fit/Training;Vestibular;Taping   PT Next Visit Plan continue gait, LLE weight bearing, strengthening   Consulted and Agree with Plan of Care Patient      Patient will benefit from skilled therapeutic intervention in order to improve the following deficits and impairments:  Abnormal gait, Decreased activity tolerance, Decreased balance, Decreased cognition, Decreased mobility, Difficulty walking, Decreased strength, Impaired tone, Decreased knowledge of precautions, Decreased knowledge of use of DME  Visit Diagnosis: 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   Willow Ora, PTA, [place] [address] [place], [place] [address] [phone] 05/01/16, 11:40 AM   Name: BEARL TALARICO MRN: [id number] Date  of Birth: 09-09-1955

## 2016-05-02 ENCOUNTER — Encounter: Payer: Self-pay | Admitting: Speech Pathology

## 2016-05-02 ENCOUNTER — Ambulatory Visit: Payer: Managed Care, Other (non HMO) | Admitting: Physical Therapy

## 2016-05-02 ENCOUNTER — Ambulatory Visit: Payer: Managed Care, Other (non HMO) | Admitting: Occupational Therapy

## 2016-05-02 ENCOUNTER — Encounter: Payer: Self-pay | Admitting: Occupational Therapy

## 2016-05-02 ENCOUNTER — Encounter: Payer: Self-pay | Admitting: Physical Therapy

## 2016-05-02 VITALS — BP 143/97 | HR 71

## 2016-05-02 VITALS — BP 140/98

## 2016-05-02 DIAGNOSIS — M6281 Muscle weakness (generalized): Secondary | ICD-10-CM | POA: Diagnosis not present

## 2016-05-02 DIAGNOSIS — G8112 Spastic hemiplegia affecting left dominant side: Secondary | ICD-10-CM

## 2016-05-02 DIAGNOSIS — R2681 Unsteadiness on feet: Secondary | ICD-10-CM

## 2016-05-02 DIAGNOSIS — M25622 Stiffness of left elbow, not elsewhere classified: Secondary | ICD-10-CM

## 2016-05-02 DIAGNOSIS — R293 Abnormal posture: Secondary | ICD-10-CM

## 2016-05-02 DIAGNOSIS — I69315 Cognitive social or emotional deficit following cerebral infarction: Secondary | ICD-10-CM

## 2016-05-02 DIAGNOSIS — I679 Cerebrovascular disease, unspecified: Principal | ICD-10-CM

## 2016-05-02 DIAGNOSIS — R2689 Other abnormalities of gait and mobility: Secondary | ICD-10-CM

## 2016-05-02 DIAGNOSIS — M25612 Stiffness of left shoulder, not elsewhere classified: Secondary | ICD-10-CM

## 2016-05-02 NOTE — Therapy (Signed)
[facility] [address] [place], [place], [address] Phone: [phone]   Fax:  [phone]  Occupational Therapy Treatment  Patient Details  Name: Adam Perez MRN: [id number] Date of Birth: 1955-04-26 Referring Provider: Dr. Renato GailsJohn Aguilar  Encounter Date: 05/02/2016      OT End of Session - 05/02/16 1240    Visit Number 9   Number of Visits 16   Date for OT Re-Evaluation 05/20/16   Authorization Type [employer], 60 PT/OT ,, no auth required.    Authorization - Visit Number 9   Authorization - Number of Visits 30   OT Start Time 1101   OT Stop Time 1144   OT Time Calculation (min) 43 min   Activity Tolerance Patient tolerated treatment well      History reviewed. No pertinent past medical history.  History reviewed. No pertinent surgical history.  Vitals:   05/02/16 1111  BP: (!) 143/97  Pulse: 71        Subjective Assessment - 05/02/16 1115    Subjective  I took my medicine but that bottom number is still too high isn't it?   Pertinent History see epic, care everywhere. Pt with R CVA, HTN,    Patient Stated Goals I want to walk normally and be able to hold a cup without dropping it   Currently in Pain? No/denies                      OT Treatments/Exercises (OP) - 05/02/16 0001      ADLs   ADL Comments Addresed bed positioning to reduce tone and also addressed supine to sitting with emphasis on using more normal movement patterns, using L side of body in activity and reducing effort.  After practice, pt able to return demonstrate several times. Pt reported "this is so much easier."     Neurological Re-education Exercises   Other Exercises 1 Neuro re ed to address trunk control, weigth shift and alignment with sit to stand, standing and transfers.   NMR in supine to address LUE for beginning hold with chest presses and elbow flexion/extension. Pt with significant tone in LUE and has pain on  inside of elbow with full elbow extension. Pt with very difficult time grading effort and following directions for activities with LUE.  Pt reports that he has had no issues with elbow splint and will wear tonight all night.                 OT Education - 05/02/16 1232    Education provided Yes   Education Details bed positioing for tone and bed mobility   Person(s) Educated Patient   Methods Explanation;Demonstration;Verbal cues   Comprehension Verbalized understanding;Returned demonstration          OT Short Term Goals - 05/02/16 1232      OT SHORT TERM GOAL #1   Title Pt and family will be mod I with HEP - 04/22/2016   Status Achieved     OT SHORT TERM GOAL #2   Title Pt will demonstrate ability for PROM of shoulder fleixon to 130* in supine with no pain to aide in self care and as prep for low functional reach.   Status Achieved  03/12/2016 150* in supine 0/10     OT SHORT TERM GOAL #3   Title Pt will be mod I with bathing   Status Achieved     OT SHORT TERM GOAL #4  Title Pt will be mod I with tub bench transfers   Status Achieved     OT SHORT TERM GOAL #5   Title Pt will be mod I with tying shoes using AE   Status Achieved           OT Long Term Goals - 05/02/16 1233      OT LONG TERM GOAL #1   Title Pt and family will be mod I with upgraded HEP- 05/20/2016   Status On-going     OT LONG TERM GOAL #2   Title Pt will be mod I with simple familiar hot meal prep   Status On-going     OT LONG TERM GOAL #3   Title Pt will demonstrate ability to use LUE as stabilizer during basic ADL's 50% of the time.   Status On-going     OT LONG TERM GOAL #4   Title Pt will demonstrate ability to employ 2 strategies to reduce tone in LUE in order to improve ability to use LUE as stabilizer in basic ADL's   Status Achieved               Plan - 05/02/16 1233    Clinical Impression Statement Pt progressing toward however with signficant tone in LUE. Pt to see  rehab MD to address   Rehab Potential Good   Clinical Impairments Affecting Rehab Potential decreased insight, severity of UE deficits   OT Frequency 2x / week   OT Duration 8 weeks   OT Treatment/Interventions Self-care/ADL training;Moist Heat;Electrical Stimulation;DME and/or AE instruction;Neuromuscular education;Therapeutic exercise;Functional Mobility Training;Manual Therapy;Passive range of motion;Splinting;Therapeutic activities;Balance training;Cognitive remediation/compensation   Plan check night time wear for elbow splint, NMR for LUE/trunk, proximal stability , low reach, manual therapy   Consulted and Agree with Plan of Care Patient      Patient will benefit from skilled therapeutic intervention in order to improve the following deficits and impairments:  Abnormal gait, Decreased activity tolerance, Decreased balance, Decreased cognition, Decreased knowledge of use of DME, Decreased mobility, Decreased range of motion, Decreased safety awareness, Difficulty walking, Decreased strength, Impaired UE functional use, Impaired tone  Visit Diagnosis: 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   Norton Pastel, OTR/L 05/02/2016, 12:47 PM  [facility] [address] [place], [place], [address] Phone: [phone]   Fax:  [phone]  Name: Adam Perez MRN: [id number] Date of Birth: 04/25/1955

## 2016-05-02 NOTE — Patient Instructions (Signed)
Bed positioning and bed mobility

## 2016-05-03 NOTE — Therapy (Signed)
[facility] [address] [place], [place], [address] Phone: [phone]   Fax:  [phone]  Physical Therapy Treatment  Patient Details  Name: Adam Perez MRN: [id number] Date of Birth: 1956-03-07 Referring Provider: Levada Schilling, MD  Encounter Date: 05/02/2016      PT End of Session - 05/02/16 1150    Visit Number 10   Number of Visits 16   Date for PT Re-Evaluation 05/20/16   Authorization Type [employer]-60 visit limit combined PT/OT   PT Start Time 1148   PT Stop Time 1230   PT Time Calculation (min) 42 min   Equipment Utilized During Treatment Gait belt   Activity Tolerance Patient tolerated treatment well   Behavior During Therapy [facility] for tasks assessed/performed      History reviewed. No pertinent past medical history.  History reviewed. No pertinent surgical history.  Vitals:   05/02/16 1149 05/02/16 1221 05/02/16 1227  BP: (!) 140/96 before session (!) 140/100 after exercise (!) 140/98 after 5 minute rest at end of session        Subjective Assessment - 05/02/16 1149    Subjective No new complaints. No falls to report or pain to report. Did take his BP medication this am. OT reports BP increase with their session, needs recheck before starting PT.    Patient is accompained by: Family member   Pertinent History HTN, PTSD   Limitations Standing;Walking;House hold activities   Patient Stated Goals improve mobility/walking, increase use of LUE for work   Currently in Pain? No/denies   Pain Score 0-No pain            [facility] Adult PT Treatment/Exercise - 05/02/16 1015      Self-Care   Self-Care Other Self-Care Comments   Other Self-Care Comments  discussed brace coverage with insurance and what orthotist is submitting, hoping to get approved. discussed continued elevated BP readings and need for MD call/follow up to have this addressed. Pt verbalized understanding of both. He plans to call his MD  at the [facility] and see about BP management and if any benefits he may have there for brace coverage.                                      Knee/Hip Exercises: Seated   Long Arc Quad AROM;Strengthening;Left;1 set;10 reps;Limitations   Long [employer] Limitations cues to maintain upright posture in unsupported sitting (not lean back) and for full knee extension with 5 sec holds before lowering foot back to floor.   Heel Slides Strengthening;Left;1 set;10 reps;AROM   Heel Slides Limitations foot on towel on slide board: AA for maximal range of motion and cues/assist to slow the return of knee extension, 2 sets of 10 reps performed     Knee/Hip Exercises: Supine   Bridges Strengthening;10 reps;2 sets   Bridges Limitations with 3 sec holds and cues on increased glut activation, cues to lift as high as possible with each bridge; right leg in full extension on mat for single leg bridges                Single Leg Bridge AROM;Strengthening;Left;2 sets;10 reps;Limitations   Straight Leg Raises AROM;Left;1 set;10 reps   Straight Leg Raises Limitations cues to maintain knee extension and for 5 sec holds.             PT Short Term Goals - 04/23/16  [place] #1   Title verbalize understanding of CVA risk factors/warning signs (Target Date for all STGs: 04/22/16)   Baseline achieved per OT note 04/22/16   Status Achieved     PT SHORT TERM GOAL #2   Title perform BERG with appropriate LTG to be written    Status Achieved     PT SHORT TERM GOAL #3   Title improve timed up and go to < 27 sec for improved mobility   Status Achieved     PT SHORT TERM GOAL #4   Title improve gait velocity to > 1.2 ft/sec for improved mobility   Status Achieved     PT SHORT TERM GOAL #5   Title Will improve BERG balance score to >45/56 to decrease risk for falls    Baseline 04/22/16: 41/56   Status Not Met           PT Long Term Goals - 04/17/16 2137      PT LONG TERM GOAL #1   Title  independent with HEP (Target Date for all LTGs: 05/20/16)   Status On-going     PT LONG TERM GOAL #2   Title amb > 500' on various indoor/outdoor surfaces with LRAD (and possibly AFO) modified independent for improved community access   Status On-going     PT LONG TERM GOAL #3   Title improve timed up and go to < 20 sec for improved mobility    Status On-going     PT LONG TERM GOAL #4   Title improve gait velocity to > 1.8 ft/sec for decreased fall risk and improved mobility   Status On-going            Plan - 05/02/16 1150    Clinical Impression Statement today's skilled session discussed bracing and insurance coverage/lack theroff. Pt is planning to get an appt with [facility] doctor about continued elevated BP's and to also check into any coverage he may have through them. Remainder of session continued to focus on left LE strengthening. Pt is making steady progress despite limited activity due to elevated BP's. Pt should benefit from continued PT to progress toward unmet goals.                            Clinical Impairments Affecting Rehab Potential cognition, severity of deficits   PT Treatment/Interventions ADLs/Self Care Home Management;Electrical Stimulation;Cryotherapy;Neuromuscular re-education;Balance training;Therapeutic exercise;Therapeutic activities;Functional mobility training;Stair training;Gait training;DME Instruction;Patient/family education;Orthotic Fit/Training;Vestibular;Taping   PT Next Visit Plan continue gait, LLE weight bearing, strengthening   Consulted and Agree with Plan of Care Patient      Patient will benefit from skilled therapeutic intervention in order to improve the following deficits and impairments:  Abnormal gait, Decreased activity tolerance, Decreased balance, Decreased cognition, Decreased mobility, Difficulty walking, Decreased strength, Impaired tone, Decreased knowledge of precautions, Decreased knowledge of use of DME  Visit Diagnosis: Spastic  hemiplegia of left dominant side due to cerebrovascular disease, unspecified cerebrovascular disease type (HCC)  Muscle weakness (generalized)  Unsteadiness on feet  Abnormal posture  Other abnormalities of gait and mobility     Problem List There are no active problems to display for this patient.   Willow Ora, PTA, [place] [address], [place], [place] [address] [phone] 05/03/16, 3:09 PM   Name: Adam Perez MRN: [id number] Date of Birth: [birth year]

## 2016-05-07 ENCOUNTER — Ambulatory Visit: Payer: Managed Care, Other (non HMO) | Admitting: Physical Therapy

## 2016-05-07 ENCOUNTER — Encounter: Payer: Self-pay | Admitting: Speech Pathology

## 2016-05-07 ENCOUNTER — Encounter: Payer: Self-pay | Admitting: Physical Therapy

## 2016-05-07 ENCOUNTER — Ambulatory Visit: Payer: Managed Care, Other (non HMO) | Admitting: Occupational Therapy

## 2016-05-07 ENCOUNTER — Encounter: Payer: Self-pay | Admitting: Occupational Therapy

## 2016-05-07 VITALS — BP 134/85 | HR 70

## 2016-05-07 VITALS — BP 168/98

## 2016-05-07 DIAGNOSIS — R293 Abnormal posture: Secondary | ICD-10-CM

## 2016-05-07 DIAGNOSIS — M6281 Muscle weakness (generalized): Secondary | ICD-10-CM | POA: Diagnosis not present

## 2016-05-07 DIAGNOSIS — I679 Cerebrovascular disease, unspecified: Principal | ICD-10-CM

## 2016-05-07 DIAGNOSIS — G8112 Spastic hemiplegia affecting left dominant side: Secondary | ICD-10-CM

## 2016-05-07 DIAGNOSIS — R2681 Unsteadiness on feet: Secondary | ICD-10-CM

## 2016-05-07 DIAGNOSIS — R2689 Other abnormalities of gait and mobility: Secondary | ICD-10-CM

## 2016-05-07 DIAGNOSIS — M25622 Stiffness of left elbow, not elsewhere classified: Secondary | ICD-10-CM

## 2016-05-07 DIAGNOSIS — M25612 Stiffness of left shoulder, not elsewhere classified: Secondary | ICD-10-CM

## 2016-05-07 DIAGNOSIS — I69315 Cognitive social or emotional deficit following cerebral infarction: Secondary | ICD-10-CM

## 2016-05-07 NOTE — Therapy (Signed)
[facility] [address] [place], [place], [address] Phone: [phone]   Fax:  [phone]  Occupational Therapy Treatment  Patient Details  Name: Adam Perez MRN: [id number] Date of Birth: December 21, 1955 Referring Provider: Dr. Renato Gails  Encounter Date: 05/07/2016      OT End of Session - 05/07/16 1145    Visit Number 10   Date for OT Re-Evaluation 05/20/16   Authorization Type [employer], 60 PT/OT ,, no auth required.    Authorization - Visit Number 10   Authorization - Number of Visits 30   OT Start Time 1016   OT Stop Time 1059   OT Time Calculation (min) 43 min   Activity Tolerance Patient tolerated treatment well      History reviewed. No pertinent past medical history.  History reviewed. No pertinent surgical history.  Vitals:   05/07/16 1023  BP: 134/85  Pulse: 70        Subjective Assessment - 05/07/16 1022    Subjective  I am wearing that splint and I have less spasms at night now   Pertinent History see epic, care everywhere. Pt with R CVA, HTN,    Patient Stated Goals I want to walk normally and be able to hold a cup without dropping it   Currently in Pain? No/denies                      OT Treatments/Exercises (OP) - 05/07/16 0001      Neurological Re-education Exercises   Other Exercises 1 Neuro re ed to address active low bilateral reach in closed chain activity in sitting. Emphasis on grading activity, trunk and head alignment , active shoulder flexion with active elbow extension and open hand.  Pt benefits from significant repetition for motor learning. Pt with  sigificant tone in LUE affecting ability to access isolated movement. Pt schedule for botox injeciton at the end of March.     Manual Therapy   Manual Therapy Joint mobilization;Soft tissue mobilization;Scapular mobilization   Manual therapy comments Joint, soft tissue and scap mob with emphasis on alignment of the  L shoulder girdle, elbow extension, adduciton of the arm, supination and open hand with wrist extension.  Pt with pain with elbow extension however tolerating night elbow splint.  Manual in prep for NMR see above                  OT Short Term Goals - 05/07/16 1144      OT SHORT TERM GOAL #1   Title Pt and family will be mod I with HEP - 04/22/2016   Status Achieved     OT SHORT TERM GOAL #2   Title Pt will demonstrate ability for PROM of shoulder fleixon to 130* in supine with no pain to aide in self care and as prep for low functional reach.   Status Achieved  03/12/2016 150* in supine 0/10     OT SHORT TERM GOAL #3   Title Pt will be mod I with bathing   Status Achieved     OT SHORT TERM GOAL #4   Title Pt will be mod I with tub bench transfers   Status Achieved     OT SHORT TERM GOAL #5   Title Pt will be mod I with tying shoes using AE   Status Achieved           OT Long Term Goals - 05/07/16 1144  OT LONG TERM GOAL #1   Title Pt and family will be mod I with upgraded HEP- 05/20/2016   Status On-going     OT LONG TERM GOAL #2   Title Pt will be mod I with simple familiar hot meal prep   Status On-going     OT LONG TERM GOAL #3   Title Pt will demonstrate ability to use LUE as stabilizer during basic ADL's 50% of the time.   Status On-going     OT LONG TERM GOAL #4   Title Pt will demonstrate ability to employ 2 strategies to reduce tone in LUE in order to improve ability to use LUE as stabilizer in basic ADL's   Status Achieved               Plan - 05/07/16 1144    Clinical Impression Statement Pt progressing toward goals. Pt with improvement in balance and functional mobility as well as isolated movement in LUE   Rehab Potential Good   Clinical Impairments Affecting Rehab Potential decreased insight, severity of UE deficits   OT Frequency 2x / week   OT Duration 8 weeks   OT Treatment/Interventions Self-care/ADL training;Moist  Heat;Electrical Stimulation;DME and/or AE instruction;Neuromuscular education;Therapeutic exercise;Functional Mobility Training;Manual Therapy;Passive range of motion;Splinting;Therapeutic activities;Balance training;Cognitive remediation/compensation   Plan NMR for LUE/trunk, isolated movement, grading, low reach, manual therapy, balance.    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   Norton Pastelulaski, Adam Perez Halliday, OTR/L 05/07/2016, 12:41 PM  [facility] [address] [place], [place], [address] Phone: [phone]   Fax:  [phone]  Name: Adam AnnaDonald L Perez MRN: [id number] Date of Birth: 11/04/1955

## 2016-05-08 NOTE — Therapy (Signed)
[facility] [address] [place], [place], [address] Phone: [phone]   Fax:  [phone]  Physical Therapy Treatment  Patient Details  Name: Adam Perez MRN: [id number] Date of Birth: 01/09/1956 Referring Provider: Levada Schilling, MD  Encounter Date: 05/07/2016      PT End of Session - 05/07/16 1105    Visit Number 11   Number of Visits 16   Date for PT Re-Evaluation 05/20/16   Authorization Type [employer]-60 visit limit combined PT/OT   PT Start Time 1102   PT Stop Time 1145   PT Time Calculation (min) 43 min   Equipment Utilized During Treatment Gait belt   Activity Tolerance Patient tolerated treatment well   Behavior During Therapy [facility] for tasks assessed/performed      History reviewed. No pertinent past medical history.  History reviewed. No pertinent surgical history.  Vitals:   05/07/16 1105  BP: (!) 168/98        Subjective Assessment - 05/07/16 1105    Subjective No new complaints. No falls or pain to report. Saw Gerald Stabs this past Monday and had casting done on foot/leg. Should have brace on March 12th. See's Dr Raliegh Ip on March 29th for botox.    Patient is accompained by: Family member   Pertinent History HTN, PTSD   Limitations Standing;Walking;House hold activities   Patient Stated Goals improve mobility/walking, increase use of LUE for work   Currently in Pain? No/denies   Pain Score 0-No pain             [facility] Adult PT Treatment/Exercise - 05/07/16 1111      Neuro Re-ed    Neuro Re-ed Details  standing with cane support: right foot on 4 inch box- left knee bending/straightening x 10 reps with manual assist to prevent buckling/recurvatum; using cane for balance- right fwd/bwd stepping over black half foam roll x 10 reps with stabilization to left knee as needed, with cane support- left leg stance with right foot stepping onto/off of 4 inch box x 10 reps, cues on weight shifting and for muscle  activation for increased left leg stance control.                        Knee/Hip Exercises: Supine   Heel Slides Strengthening;Left;AROM;AAROM;2 sets;10 reps;Limitations   Heel Slides Limitations with foot on towel over sliding board: worked on active flexion and extension, pt able to do so within small ranges only, if allowed into full extension needed assist to flex knee due to tone. cues for grading of movements so to decrease presence of tone and keep UE in extension obtained from OT prior to PT session   Bridges AROM;Strengthening;Both;2 sets;10 reps;Limitations   Bridges Limitations with 3 sec holds and cues on increased glut activation, cues to lift as high as possible with each bridge                Straight Leg Raises AROM;Strengthening;2 sets;Left;10 reps;Limitations   Straight Leg Raises Limitations cues to maintain knee extension and for 5 sec holds.              PT Short Term Goals - 04/23/16 1258      PT SHORT TERM GOAL #1   Title verbalize understanding of CVA risk factors/warning signs (Target Date for all STGs: 04/22/16)   Baseline achieved per OT note 04/22/16   Status Achieved     PT SHORT TERM GOAL #2  Title perform BERG with appropriate LTG to be written    Status Achieved     PT SHORT TERM GOAL #3   Title improve timed up and go to < 27 sec for improved mobility   Status Achieved     PT SHORT TERM GOAL #4   Title improve gait velocity to > 1.2 ft/sec for improved mobility   Status Achieved     PT SHORT TERM GOAL #5   Title Will improve BERG balance score to >45/56 to decrease risk for falls    Baseline 04/22/16: 41/56   Status Not Met           PT Long Term Goals - 04/17/16 2137      PT LONG TERM GOAL #1   Title independent with HEP (Target Date for all LTGs: 05/20/16)   Status On-going     PT LONG TERM GOAL #2   Title amb > 500' on various indoor/outdoor surfaces with LRAD (and possibly AFO) modified independent for improved community access    Status On-going     PT LONG TERM GOAL #3   Title improve timed up and go to < 20 sec for improved mobility    Status On-going     PT LONG TERM GOAL #4   Title improve gait velocity to > 1.8 ft/sec for decreased fall risk and improved mobility   Status On-going           Plan - 05/07/16 1105    Clinical Impression Statement today's skilled session continued to address left LE strengthening and left LE stance stability/weight shifting with pre-gait/balance activities. Emphasis was placed on decreased grading of exertion to maintain decreased UE tone with activity with good carryover noted with cues/facilitation. Pt was able to achieve active knee flexion/extension on left within small ranges (~30 degrees to 90 degrees flexion), it he extended fully tone kicked it and needed assist to break tone to bend knee. No issues reported with session. BP remaind within parameters that session could continue, however still elevated into the 35'A diastolically. Pt is making steady progress toward goals and should benefit from continued PT to progress toward unmet goals.     Clinical Impairments Affecting Rehab Potential cognition, severity of deficits   PT Treatment/Interventions ADLs/Self Care Home Management;Electrical Stimulation;Cryotherapy;Neuromuscular re-education;Balance training;Therapeutic exercise;Therapeutic activities;Functional mobility training;Stair training;Gait training;DME Instruction;Patient/family education;Orthotic Fit/Training;Vestibular;Taping   PT Next Visit Plan continue gait, LLE weight bearing, strengthening   Consulted and Agree with Plan of Care Patient      Patient will benefit from skilled therapeutic intervention in order to improve the following deficits and impairments:  Abnormal gait, Decreased activity tolerance, Decreased balance, Decreased cognition, Decreased mobility, Difficulty walking, Decreased strength, Impaired tone, Decreased knowledge of precautions,  Decreased knowledge of use of DME  Visit Diagnosis: 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   Willow Adam, PTA, [place] [address] [place], [place] [address] [phone] 05/08/16, 4:38 PM   Name: Adam Perez MRN: [id number] Date of Birth: 06-30-1955

## 2016-05-09 ENCOUNTER — Encounter: Payer: Self-pay | Admitting: Physical Therapy

## 2016-05-09 ENCOUNTER — Encounter: Payer: Self-pay | Admitting: Occupational Therapy

## 2016-05-09 ENCOUNTER — Ambulatory Visit: Payer: Managed Care, Other (non HMO) | Admitting: Physical Therapy

## 2016-05-09 ENCOUNTER — Encounter: Payer: Self-pay | Admitting: Speech Pathology

## 2016-05-09 ENCOUNTER — Ambulatory Visit: Payer: Managed Care, Other (non HMO) | Attending: Physical Medicine & Rehabilitation | Admitting: Occupational Therapy

## 2016-05-09 VITALS — BP 144/90 | HR 66

## 2016-05-09 VITALS — BP 147/86 | HR 72

## 2016-05-09 DIAGNOSIS — G8112 Spastic hemiplegia affecting left dominant side: Secondary | ICD-10-CM | POA: Insufficient documentation

## 2016-05-09 DIAGNOSIS — M6281 Muscle weakness (generalized): Secondary | ICD-10-CM

## 2016-05-09 DIAGNOSIS — I679 Cerebrovascular disease, unspecified: Secondary | ICD-10-CM | POA: Diagnosis present

## 2016-05-09 DIAGNOSIS — I69352 Hemiplegia and hemiparesis following cerebral infarction affecting left dominant side: Secondary | ICD-10-CM | POA: Insufficient documentation

## 2016-05-09 DIAGNOSIS — M25612 Stiffness of left shoulder, not elsewhere classified: Secondary | ICD-10-CM | POA: Insufficient documentation

## 2016-05-09 DIAGNOSIS — M25622 Stiffness of left elbow, not elsewhere classified: Secondary | ICD-10-CM | POA: Insufficient documentation

## 2016-05-09 DIAGNOSIS — R2689 Other abnormalities of gait and mobility: Secondary | ICD-10-CM

## 2016-05-09 DIAGNOSIS — R293 Abnormal posture: Secondary | ICD-10-CM

## 2016-05-09 DIAGNOSIS — I69315 Cognitive social or emotional deficit following cerebral infarction: Secondary | ICD-10-CM | POA: Diagnosis present

## 2016-05-09 DIAGNOSIS — R2681 Unsteadiness on feet: Secondary | ICD-10-CM | POA: Diagnosis present

## 2016-05-09 NOTE — Therapy (Signed)
[facility] [address] [place], [place], [address] Phone: [phone]   Fax:  [phone]  Occupational Therapy Treatment  Patient Details  Name: Adam Perez MRN: [id number] Date of Birth: [birth year] Referring Provider: Dr. Renato Gails  Encounter Date: 05/09/2016      OT End of Session - 05/09/16 1212    Visit Number 11   Number of Visits 16   Date for OT Re-Evaluation 05/20/16   Authorization Type [employer], 60 PT/OT ,, no auth required.    Authorization - Visit Number 11   Authorization - Number of Visits 30   OT Start Time 0931   OT Stop Time 1017   OT Time Calculation (min) 46 min   Activity Tolerance Patient tolerated treatment well      History reviewed. No pertinent past medical history.  History reviewed. No pertinent surgical history.  Vitals:   05/09/16 0935  BP: (!) 147/86  Pulse: 72        Subjective Assessment - 05/09/16 0934    Subjective  I didn't realize I was doing my exercises wrong   Pertinent History see epic, care everywhere. Pt with R CVA, HTN,    Patient Stated Goals I want to walk normally and be able to hold a cup without dropping it   Currently in Pain? No/denies                      OT Treatments/Exercises (OP) - 05/09/16 0001      Neurological Re-education Exercises   Other Exercises 1 Neuro re ed to review and practice foward stretch exercises for LUE - when asked to demonstrate pt performing incorrectly.  Pt has been shown HEP and been able to return demonstrate but unable to today.  Reviewed rationale and purpose again and then videotaped pt completing program correclty.  Also again addressed sit to stand and stand to sit in midline vs shifting all the way to the unaffected side. Pt able to return demonstrate and verbalize rationale. Will continue to monitor.  Neuro re ed in sitting to address low unilateral reach in closed chain with emphasis on grading  actiivty, grasp and beginning release.  Pt requires significant repetition.                 OT Education - 05/09/16 1209    Education provided Yes   Education Details again reviewed HEP for LUE and sit to stand/stand to sit   Person(s) Educated Patient   Methods Explanation;Demonstration;Verbal cues;Handout   Comprehension Verbalized understanding;Returned demonstration;Other (comment)  pt made videotape for home reference          OT Short Term Goals - 05/09/16 1209      OT SHORT TERM GOAL #1   Title Pt and family will be mod I with HEP - 04/22/2016   Status Achieved     OT SHORT TERM GOAL #2   Title Pt will demonstrate ability for PROM of shoulder fleixon to 130* in supine with no pain to aide in self care and as prep for low functional reach.   Status Achieved  03/12/2016 150* in supine 0/10     OT SHORT TERM GOAL #3   Title Pt will be mod I with bathing   Status Achieved     OT SHORT TERM GOAL #4   Title Pt will be mod I with tub bench transfers   Status Achieved     OT SHORT TERM GOAL #  5   Title Pt will be mod I with tying shoes using AE   Status Achieved           OT Long Term Goals - 05/09/16 1210      OT LONG TERM GOAL #1   Title Pt and family will be mod I with upgraded HEP- 05/20/2016   Status On-going     OT LONG TERM GOAL #2   Title Pt will be mod I with simple familiar hot meal prep   Status On-going     OT LONG TERM GOAL #3   Title Pt will demonstrate ability to use LUE as stabilizer during basic ADL's 50% of the time.   Status On-going     OT LONG TERM GOAL #4   Title Pt will demonstrate ability to employ 2 strategies to reduce tone in LUE in order to improve ability to use LUE as stabilizer in basic ADL's   Status Achieved               Plan - 05/09/16 1210    Clinical Impression Statement Pt progressing toward goal. Pt with signficant tone in LUE -has been referred for possible botox injection. Pt with beginning ability to  grade effort and beginning to release objects with LUE   Rehab Potential Good   Clinical Impairments Affecting Rehab Potential decreased insight, severity of UE deficits   OT Frequency 2x / week   OT Duration 8 weeks   OT Treatment/Interventions Self-care/ADL training;Moist Heat;Electrical Stimulation;DME and/or AE instruction;Neuromuscular education;Therapeutic exercise;Functional Mobility Training;Manual Therapy;Passive range of motion;Splinting;Therapeutic activities;Balance training;Cognitive remediation/compensation   Plan NMR for LUE/trunk, isolated movement, grading, low reach, manual therapy and balance.    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   Norton Pastelulaski, Karen Halliday, OTR/L 05/09/2016, 12:13 PM  [facility] [address] [place], [place], [address] Phone: [phone]   Fax:  [phone]  Name: Adam Perez MRN: [id number] Date of Birth: 07-20-1955

## 2016-05-09 NOTE — Therapy (Signed)
[facility] [address] [place], [place], [address] Phone: [phone]   Fax:  [phone]  Physical Therapy Treatment  Patient Details  Name: Adam Perez MRN: [id number] Date of Birth: Aug 03, 1955 Referring Provider: Levada Schilling, MD  Encounter Date: 05/09/2016      PT End of Session - 05/09/16 1112    Visit Number 12   Number of Visits 16   Date for PT Re-Evaluation 05/20/16   Authorization Type [employer]-60 visit limit combined PT/OT   PT Start Time 1020   PT Stop Time 1104   PT Time Calculation (min) 44 min   Activity Tolerance Patient tolerated treatment well   Behavior During Therapy [facility] for tasks assessed/performed      History reviewed. No pertinent past medical history.  History reviewed. No pertinent surgical history.  Vitals:   05/09/16 1044  BP: (!) 144/90  Pulse: 66        Subjective Assessment - 05/09/16 1030    Subjective Doing a little walking at home; has been stretching at home but needed some correction by OT.  No falls at home   Pertinent History HTN, PTSD   Limitations Standing;Walking;House hold activities   Patient Stated Goals improve mobility/walking, increase use of LUE for work   Currently in Pain? No/denies           [facility] Adult PT Treatment/Exercise - 05/09/16 1046      Therapeutic Activites    Therapeutic Activities Other Therapeutic Activities   Other Therapeutic Activities Use of lumbar roll in w/c and chair for lumbar support and increase lordosis and decrease posterior pelvic tilt     Neuro Re-ed    Neuro Re-ed Details  NMR at counter top during forwards and retro gait with focus on head righting and trunk to midline and isolated activation of hamstrings for knee flexion during swing and hip extension during retro gait. Continued NMR on treadmill at 0.4-0.5 mph x 1-2 minutes to facilitate increased step length, stance time and activation of LLE; pt required max-total  A on L side for full LLE advancement due to lack of ankle DF and knee flexion and verbal cues for increased stance time on L, increased step length on R and trunk and head righting to midline.            PT Education - 05/09/16 1110    Education provided Yes   Education Details lumbar roll and positioning in w/c-advised to bring towel next visit to add lumbar roll to patient's w/c.  Head and trunk position for tone management and LLE activation.   Person(s) Educated Patient   Methods Explanation;Demonstration   Comprehension Verbal cues required;Need further instruction;Tactile cues required          PT Short Term Goals - 04/23/16 1258      PT SHORT TERM GOAL #1   Title verbalize understanding of CVA risk factors/warning signs (Target Date for all STGs: 04/22/16)   Baseline achieved per OT note 04/22/16   Status Achieved     PT SHORT TERM GOAL #2   Title perform BERG with appropriate LTG to be written    Status Achieved     PT SHORT TERM GOAL #3   Title improve timed up and go to < 27 sec for improved mobility   Status Achieved     PT SHORT TERM GOAL #4   Title improve gait velocity to > 1.2 ft/sec for improved mobility   Status Achieved  PT SHORT TERM GOAL #5   Title Will improve BERG balance score to >45/56 to decrease risk for falls    Baseline 04/22/16: 41/56   Status Not Met           PT Long Term Goals - 04/17/16 2137      PT LONG TERM GOAL #1   Title independent with HEP (Target Date for all LTGs: 05/20/16)   Status On-going     PT LONG TERM GOAL #2   Title amb > 500' on various indoor/outdoor surfaces with LRAD (and possibly AFO) modified independent for improved community access   Status On-going     PT LONG TERM GOAL #3   Title improve timed up and go to < 20 sec for improved mobility    Status On-going     PT LONG TERM GOAL #4   Title improve gait velocity to > 1.8 ft/sec for decreased fall risk and improved mobility   Status On-going                Plan - 05/09/16 1112    Clinical Impression Statement Skilled session today with focus on posture, head and trunk righting in w/c and use of lumbar roll for positioning as well as NMR for LLE activation and postural control over ground and on treadmill.  Pt will likely require use of body weight support for safe prolonged treadmill training.  Pt also continues to demonstrated increased difficulty with foot clearance; pt not scheduled to receive AFO until March.  Pt may benefit from one or two more visits and then be placed on PT hold to save visits for AFO and gait training once pt receives AFO.     Rehab Potential Good   Clinical Impairments Affecting Rehab Potential cognition, severity of deficits   PT Frequency 2x / week   PT Duration 8 weeks   PT Treatment/Interventions ADLs/Self Care Home Management;Electrical Stimulation;Cryotherapy;Neuromuscular re-education;Balance training;Therapeutic exercise;Therapeutic activities;Functional mobility training;Stair training;Gait training;DME Instruction;Patient/family education;Orthotic Fit/Training;Vestibular;Taping   PT Next Visit Plan Set up lumbar roll in w/c with towel from home; Continue head and trunk righting during dynamic movement, NMR LLE and activation of hamstring (estim??), Body weight support on treadmill   Consulted and Agree with Plan of Care Patient      Patient will benefit from skilled therapeutic intervention in order to improve the following deficits and impairments:  Abnormal gait, Decreased activity tolerance, Decreased balance, Decreased cognition, Decreased mobility, Difficulty walking, Decreased strength, Impaired tone, Decreased knowledge of precautions, Decreased knowledge of use of DME  Visit Diagnosis: Hemiplegia and hemiparesis following cerebral infarction affecting left dominant side (HCC)  Other abnormalities of gait and mobility  Muscle weakness (generalized)     Problem List There are no  active problems to display for this patient.  Raylene Everts, PT, DPT 05/09/16    11:18 AM   [facility] [address] [place], [place], [address] Phone: [phone]   Fax:  [phone]  Name: DAILY CRATE MRN: [id number] Date of Birth: Sep 27, 1955

## 2016-05-10 ENCOUNTER — Ambulatory Visit: Payer: Self-pay | Admitting: Physical Medicine & Rehabilitation

## 2016-05-13 ENCOUNTER — Encounter: Payer: Managed Care, Other (non HMO) | Attending: Physical Medicine & Rehabilitation

## 2016-05-13 ENCOUNTER — Ambulatory Visit: Payer: Managed Care, Other (non HMO) | Admitting: Physical Therapy

## 2016-05-13 ENCOUNTER — Encounter: Payer: Self-pay | Admitting: Physical Medicine & Rehabilitation

## 2016-05-13 ENCOUNTER — Encounter: Payer: Self-pay | Admitting: Occupational Therapy

## 2016-05-13 ENCOUNTER — Ambulatory Visit: Payer: Managed Care, Other (non HMO) | Admitting: Occupational Therapy

## 2016-05-13 ENCOUNTER — Encounter: Payer: Self-pay | Admitting: Physical Therapy

## 2016-05-13 ENCOUNTER — Ambulatory Visit (HOSPITAL_BASED_OUTPATIENT_CLINIC_OR_DEPARTMENT_OTHER): Payer: Managed Care, Other (non HMO) | Admitting: Physical Medicine & Rehabilitation

## 2016-05-13 VITALS — BP 152/96 | HR 69 | Resp 14

## 2016-05-13 DIAGNOSIS — M25612 Stiffness of left shoulder, not elsewhere classified: Secondary | ICD-10-CM

## 2016-05-13 DIAGNOSIS — I69359 Hemiplegia and hemiparesis following cerebral infarction affecting unspecified side: Secondary | ICD-10-CM | POA: Diagnosis not present

## 2016-05-13 DIAGNOSIS — G8112 Spastic hemiplegia affecting left dominant side: Secondary | ICD-10-CM | POA: Diagnosis not present

## 2016-05-13 DIAGNOSIS — R2681 Unsteadiness on feet: Secondary | ICD-10-CM

## 2016-05-13 DIAGNOSIS — M6281 Muscle weakness (generalized): Secondary | ICD-10-CM

## 2016-05-13 DIAGNOSIS — I69315 Cognitive social or emotional deficit following cerebral infarction: Secondary | ICD-10-CM

## 2016-05-13 DIAGNOSIS — R2689 Other abnormalities of gait and mobility: Secondary | ICD-10-CM

## 2016-05-13 DIAGNOSIS — R293 Abnormal posture: Secondary | ICD-10-CM

## 2016-05-13 DIAGNOSIS — I679 Cerebrovascular disease, unspecified: Principal | ICD-10-CM

## 2016-05-13 DIAGNOSIS — M25622 Stiffness of left elbow, not elsewhere classified: Secondary | ICD-10-CM

## 2016-05-13 DIAGNOSIS — I69352 Hemiplegia and hemiparesis following cerebral infarction affecting left dominant side: Secondary | ICD-10-CM

## 2016-05-13 NOTE — Patient Instructions (Signed)
Plan Botox to the left arm  Plan phenol nerve block, left leg

## 2016-05-13 NOTE — Progress Notes (Signed)
Subjective:    Patient ID: Adam Perez, male    DOB: 1955/07/04, 61 y.o.   MRN: [id number]  HPI 61 year old left-handed male who had a right medullary infarct. Onset 03/01/2016 while at work, initially went to [place] but was trasferred to [facility], admited to IP rehab where he stayed 2-3 weeks. Going to OP PT and OT,  Works as a Artist, works on looms.  On STD currently.  Pt is able to dress and bathe independently.  Walks slowly with a cane.   Left arm is still very weak only uses Right hand to write, to bathe and dress himself.  Uses shower chair Waiting for AFO, cannot walk on treadmill. Unable to control left leg Pain Inventory Average Pain 3 Pain Right Now 3 My pain is tingling  In the last 24 hours, has pain interfered with the following? General activity 10 Relation with others 10 Enjoyment of life 10 What TIME of day is your pain at its worst? morning Sleep (in general) NA  Pain is worse with: not answered Pain improves with: not answered Relief from Meds: 5  Mobility how many minutes can you walk? 0 ability to climb steps?  no do you drive?  no  Function disabled: date disabled 03/01/2016 I need assistance with the following:  feeding, dressing, bathing and household duties  Neuro/Psych spasms  Prior Studies Any changes since last visit?  no  Physicians involved in your care Any changes since last visit?  no   No family history on file. Social History   Social History  . Marital status: Divorced    Spouse name: N/A  . Number of children: N/A  . Years of education: N/A   Social History Main Topics  . Smoking status: Never Smoker  . Smokeless tobacco: Never Used  . Alcohol use No  . Drug use: Unknown  . Sexual activity: Not Asked   Other Topics Concern  . None   Social History Narrative  . None   No past surgical history on file. No past medical history on file. BP (!) 152/96   Pulse 69   Resp 14   SpO2 96%   Opioid Risk  Score:   Fall Risk Score:  `1  Depression screen PHQ 2/9  Depression screen PHQ 2/9 05/13/2016  Decreased Interest 1  Down, Depressed, Hopeless 0  PHQ - 2 Score 1  Altered sleeping 2  Tired, decreased energy 1  Change in appetite 2  Feeling bad or failure about yourself  1  Trouble concentrating 1  Moving slowly or fidgety/restless 0  Suicidal thoughts 0  PHQ-9 Score 8  Difficult doing work/chores Extremely dIfficult    Review of Systems  HENT: Negative.   Eyes: Negative.   Respiratory: Negative.   Cardiovascular: Negative.   Gastrointestinal: Negative.   Endocrine: Negative.   Genitourinary: Negative.   Musculoskeletal:       Spasms  Skin: Negative.   Allergic/Immunologic: Negative.   Neurological: Positive for weakness.  Hematological: Negative.   Psychiatric/Behavioral: Negative.   All other systems reviewed and are negative.      Objective:   Physical Exam  Constitutional: He is oriented to person, place, and time. He appears well-developed and well-nourished. No distress.  HENT:  Head: Normocephalic.  Eyes: Conjunctivae are normal. Pupils are equal, round, and reactive to light. Right eye exhibits no discharge. Left eye exhibits no discharge. No scleral icterus.  Neck: Normal range of motion. Neck supple. No JVD present.  Cardiovascular: Normal rate, regular rhythm and normal heart sounds.  Exam reveals no friction rub.   No murmur heard. Pulmonary/Chest: Effort normal and breath sounds normal. No stridor. No respiratory distress. He has no wheezes.  Abdominal: Soft. Bowel sounds are normal. He exhibits no distension. There is no tenderness.  Musculoskeletal: Normal range of motion. He exhibits no edema or tenderness.  Neurological: He is alert and oriented to person, place, and time. No sensory deficit. Coordination and gait abnormal.  Motor strength is 5/5 in the right deltoid, biceps, triceps, grip, hip flexor, knee extensor, ankle dorsiflexor, plantar  flexor  2 minus, left deltoid, biceps, triceps 3 minus, finger flexors 2 minus, finger extensors, 0 wrist extensors, and tone. Modified Ashworth scale. Left pectoralis 3, left biceps 3, left FCU 3, left FCR 3, left FDS to Left quad. 3. Clonus sustained left ankle line ambulates with a quad cane evidence of toe drag as well as knee hyperextension  Skin: Skin is warm and dry.  Psychiatric: He has a normal mood and affect. His behavior is normal. Judgment and thought content normal.  Nursing note and vitals reviewed.         Assessment & Plan:  1. Left Dominant spastic hemiplegia Continue outpatient PT, OT Patient has tone that interferes with neuromuscular rehabilitation, has failed to improve with max dose baclofen as well as inpatient and outpatient PT, OT  Recommend botulinum toxin injection Botox doses. FCR 50 FCU 50 Biceps 150 Pectoralis 150  2. Clonus left ankle, knee hyperextension, recommend tibial, neural lysis with phenol

## 2016-05-13 NOTE — Therapy (Signed)
[facility] [address] [place], [place], [address] Phone: [phone]   Fax:  [phone]  Occupational Therapy Treatment  Patient Details  Name: Adam Perez MRN: [id number] Date of Birth: [birth year] Referring Provider: Dr. Renato GailsJohn Aguilar  Encounter Date: 05/13/2016      OT End of Session - 05/13/16 0942    Visit Number 12   Number of Visits 16   Date for OT Re-Evaluation 05/20/16   Authorization Type [employer], 60 PT/OT ,, no auth required.    Authorization - Visit Number 12   Authorization - Number of Visits 30   OT Start Time 0801   OT Stop Time 0845   OT Time Calculation (min) 44 min   Activity Tolerance Patient tolerated treatment well      History reviewed. No pertinent past medical history.  History reviewed. No pertinent surgical history.  There were no vitals filed for this visit.      Subjective Assessment - 05/13/16 0806    Subjective  I am starting to realize how much work I need to do to get back    Pertinent History see epic, care everywhere. Pt with R CVA, HTN,    Patient Stated Goals I want to walk normally and be able to hold a cup without dropping it   Currently in Pain? No/denies                      OT Treatments/Exercises (OP) - 05/13/16 0001      Neurological Re-education Exercises   Other Exercises 1 Neuro re ed initially in standing  with emphasis on increased weight and activity on LLE and activity in trunk working on low unilateral reach with LUE in closed chain activity on moving surface.  Pt needs max cues for direction and grading of activity.  Also incoprorated place and hold with LUE in as much elbow extension as possible for holding.  Pt limited by sigifcant flexor tone and tightness in L elbow as well as soft tissue pain when working toward full extension. Pt is slowly improving in available elbow extension (P/AAROM) . Pt to see rehab MD today to determine if he is  a candidate for botox injection.  Transitioned into kneeling with LUE in extended weight bearing position with reaching activity to the L with RUE to facilitate increased activity on the left - also addressed working in short ranges of kneeling to squat kneeling to kneeling with weight shifting to the left to assist pt in gaining more ease in being on the left as well as improved control.                    OT Short Term Goals - 05/13/16 0940      OT SHORT TERM GOAL #1   Title Pt and family will be mod I with HEP - 04/22/2016   Status Achieved     OT SHORT TERM GOAL #2   Title Pt will demonstrate ability for PROM of shoulder fleixon to 130* in supine with no pain to aide in self care and as prep for low functional reach.   Status Achieved  03/12/2016 150* in supine 0/10     OT SHORT TERM GOAL #3   Title Pt will be mod I with bathing   Status Achieved     OT SHORT TERM GOAL #4   Title Pt will be mod I with tub bench transfers   Status Achieved  OT SHORT TERM GOAL #5   Title Pt will be mod I with tying shoes using AE   Status Achieved           OT Long Term Goals - 05/13/16 0940      OT LONG TERM GOAL #1   Title Pt and family will be mod I with upgraded HEP- 05/20/2016   Status On-going     OT LONG TERM GOAL #2   Title Pt will be mod I with simple familiar hot meal prep   Status Achieved     OT LONG TERM GOAL #3   Title Pt will demonstrate ability to use LUE as stabilizer during basic ADL's 50% of the time.   Status On-going     OT LONG TERM GOAL #4   Title Pt will demonstrate ability to employ 2 strategies to reduce tone in LUE in order to improve ability to use LUE as stabilizer in basic ADL's   Status Achieved               Plan - 05/13/16 0941    Clinical Impression Statement Pt with slow progressing toward goals. Pt is demonstrating improvement in LUE isolated movement in low reach in closed chain activities but is limited by tone and cognition.      Rehab Potential Good   Clinical Impairments Affecting Rehab Potential decreased insight, severity of UE deficits   OT Frequency 2x / week   OT Duration 8 weeks   OT Treatment/Interventions Self-care/ADL training;Moist Heat;Electrical Stimulation;DME and/or AE instruction;Neuromuscular education;Therapeutic exercise;Functional Mobility Training;Manual Therapy;Passive range of motion;Splinting;Therapeutic activities;Balance training;Cognitive remediation/compensation   Plan NMR for LUE/trunk, isolated movement, grading, low reach manual therapy and balance.    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   Norton Pastel , OTR/L 05/13/2016, 9:49 AM  [facility] [address] [place], [place], [address] Phone: [phone]   Fax:  [phone]  Name: Adam Perez MRN: [id number] Date of Birth: 1955-04-13

## 2016-05-13 NOTE — Therapy (Signed)
[facility] [address] [place], [place], [address] Phone: [phone]   Fax:  [phone]  Physical Therapy Treatment  Patient Details  Name: Adam Perez MRN: [id number] Date of Birth: 02-10-1956 Referring Provider: Levada Schilling, MD  Encounter Date: 05/13/2016      PT End of Session - 05/13/16 1654    Visit Number 13   Number of Visits 16   Date for PT Re-Evaluation 05/27/16  hold PT next week; return once receives AFO   Authorization Type [employer]-60 visit limit combined PT/OT   PT Start Time 3546   PT Stop Time 0934   PT Time Calculation (min) 47 min   Activity Tolerance Patient tolerated treatment well   Behavior During Therapy [facility] for tasks assessed/performed      History reviewed. No pertinent past medical history.  History reviewed. No pertinent surgical history.  There were no vitals filed for this visit.      Subjective Assessment - 05/13/16 0849    Subjective No issues over the weekend.  Just finished with OT   Pertinent History HTN, PTSD   Limitations Standing;Walking;House hold activities   Patient Stated Goals improve mobility/walking, increase use of LUE for work   Currently in Pain? No/denies           [facility] Adult PT Treatment/Exercise - 05/13/16 5681      Neuro Re-ed    Neuro Re-ed Details  NMR during sit > stand from elevated mat with RLE supported on mat (off of floor) and LLE over side; performed repeated sit <> stands with focus on weight shifting, activation of LLE and maintaining head and trunk position in midline.  Continued NMR countertop with forwards and retro stepping, gait forwards and retro with LUE support with focus on maintaining head in midline, initiating weight shift at ribs and pelvis and increased stance time on LLE for full step length RLE.  Carryover of NMR to gait without cane but with +2 therapist providing facilitation at UE and trunk to minimize activation of R  shoulder and neck flexion and facilitate full lateral weight shifting, activation of LLE in stance and swing.  Pt able to demonstrate full stance time LLE and activation of L hip and knee flexors for more efficient swing phase.              PT Education - 05/13/16 1653    Education provided Yes   Education Details NMR, holding PT after next visit until pt receives AFO and then recertify pt for more visits   Person(s) Educated Patient   Methods Explanation   Comprehension Verbalized understanding          PT Short Term Goals - 04/23/16 1258      PT SHORT TERM GOAL #1   Title verbalize understanding of CVA risk factors/warning signs (Target Date for all STGs: 04/22/16)   Baseline achieved per OT note 04/22/16   Status Achieved     PT SHORT TERM GOAL #2   Title perform BERG with appropriate LTG to be written    Status Achieved     PT SHORT TERM GOAL #3   Title improve timed up and go to < 27 sec for improved mobility   Status Achieved     PT SHORT TERM GOAL #4   Title improve gait velocity to > 1.2 ft/sec for improved mobility   Status Achieved     PT SHORT TERM GOAL #5   Title Will improve BERG  balance score to >45/56 to decrease risk for falls    Baseline 04/22/16: 41/56   Status Not Met           PT Long Term Goals - 04/17/16 2137      PT LONG TERM GOAL #1   Title independent with HEP (Target Date for all LTGs: 05/20/16)   Status On-going     PT LONG TERM GOAL #2   Title amb > 500' on various indoor/outdoor surfaces with LRAD (and possibly AFO) modified independent for improved community access   Status On-going     PT LONG TERM GOAL #3   Title improve timed up and go to < 20 sec for improved mobility    Status On-going     PT LONG TERM GOAL #4   Title improve gait velocity to > 1.8 ft/sec for decreased fall risk and improved mobility   Status On-going            Plan - 05/13/16 1656    Clinical Impression Statement Continued to focus heavily on  NMR of L side for activation and weight shifting and minimizing substitution and mm grading of R side.  Pt scheduled to get AFO next week; plan to hold PT next week and then have pt return with AFO to re-assess LTG for recertification.   Clinical Impairments Affecting Rehab Potential cognition, severity of deficits   PT Treatment/Interventions ADLs/Self Care Home Management;Electrical Stimulation;Cryotherapy;Neuromuscular re-education;Balance training;Therapeutic exercise;Therapeutic activities;Functional mobility training;Stair training;Gait training;DME Instruction;Patient/family education;Orthotic Fit/Training;Vestibular;Taping   PT Next Visit Plan HOLD PT after next session; Set up lumbar roll in w/c with towel from home; Continue head and trunk righting during dynamic movement, NMR LLE and activation of hamstring (estim??), Body weight support on treadmill   Consulted and Agree with Plan of Care Patient      Patient will benefit from skilled therapeutic intervention in order to improve the following deficits and impairments:  Abnormal gait, Decreased activity tolerance, Decreased balance, Decreased cognition, Decreased mobility, Difficulty walking, Decreased strength, Impaired tone, Decreased knowledge of precautions, Decreased knowledge of use of DME  Visit Diagnosis: Hemiplegia and hemiparesis following cerebral infarction affecting left dominant side (HCC)  Muscle weakness (generalized)  Other abnormalities of gait and mobility     Problem List There are no active problems to display for this patient.   Raylene Everts, PT, DPT 05/13/16    5:01 PM    [facility] [address] [place], [place], [address] Phone: [phone]   Fax:  [phone]  Name: Adam Perez MRN: [id number] Date of Birth: 12/21/1955

## 2016-05-16 ENCOUNTER — Encounter: Payer: Self-pay | Admitting: Speech Pathology

## 2016-05-16 ENCOUNTER — Encounter: Payer: Self-pay | Admitting: Physical Therapy

## 2016-05-16 ENCOUNTER — Encounter: Payer: Self-pay | Admitting: Occupational Therapy

## 2016-05-16 ENCOUNTER — Ambulatory Visit: Payer: Managed Care, Other (non HMO) | Admitting: Physical Therapy

## 2016-05-16 ENCOUNTER — Ambulatory Visit: Payer: Managed Care, Other (non HMO) | Admitting: Occupational Therapy

## 2016-05-16 VITALS — BP 149/102 | HR 69

## 2016-05-16 VITALS — BP 145/90 | HR 69

## 2016-05-16 DIAGNOSIS — R2689 Other abnormalities of gait and mobility: Secondary | ICD-10-CM

## 2016-05-16 DIAGNOSIS — I679 Cerebrovascular disease, unspecified: Principal | ICD-10-CM

## 2016-05-16 DIAGNOSIS — I69315 Cognitive social or emotional deficit following cerebral infarction: Secondary | ICD-10-CM

## 2016-05-16 DIAGNOSIS — R293 Abnormal posture: Secondary | ICD-10-CM

## 2016-05-16 DIAGNOSIS — M6281 Muscle weakness (generalized): Secondary | ICD-10-CM

## 2016-05-16 DIAGNOSIS — M25622 Stiffness of left elbow, not elsewhere classified: Secondary | ICD-10-CM

## 2016-05-16 DIAGNOSIS — M25612 Stiffness of left shoulder, not elsewhere classified: Secondary | ICD-10-CM

## 2016-05-16 DIAGNOSIS — R2681 Unsteadiness on feet: Secondary | ICD-10-CM

## 2016-05-16 DIAGNOSIS — G8112 Spastic hemiplegia affecting left dominant side: Secondary | ICD-10-CM

## 2016-05-16 DIAGNOSIS — I69352 Hemiplegia and hemiparesis following cerebral infarction affecting left dominant side: Secondary | ICD-10-CM

## 2016-05-16 NOTE — Therapy (Signed)
[facility] [address] [place], [place], [address] Phone: [phone]   Fax:  [phone]  Occupational Therapy Treatment  Patient Details  Name: Adam Perez MRN: [id number] Date of Birth: 1955/05/03 Referring Provider: Dr. Renato Gails  Encounter Date: 05/16/2016      OT End of Session - 05/16/16 1303    Visit Number 13   Number of Visits 16   Date for OT Re-Evaluation 05/20/16   Authorization Type [employer], 60 PT/OT ,, no auth required.    Authorization - Visit Number 13   Authorization - Number of Visits 30   OT Start Time 0931   OT Stop Time 1014   OT Time Calculation (min) 43 min   Activity Tolerance Patient tolerated treatment well      History reviewed. No pertinent past medical history.  History reviewed. No pertinent surgical history.  Vitals:   05/16/16 0937  BP: (!) 145/90  Pulse: 69        Subjective Assessment - 05/16/16 1055    Subjective  I saw the rehab doctor and I really liked him    Pertinent History see epic, care everywhere. Pt with R CVA, HTN,    Patient Stated Goals I want to walk normally and be able to hold a cup without dropping it   Currently in Pain? No/denies                      OT Treatments/Exercises (OP) - 05/16/16 0001      Neurological Re-education Exercises   Other Exercises 1 Neuro re ed in sitting  to address bilateral low reach with emphasis on shoulder depression, elbow extension and moving body on arm . Pt needs cues to alllow arm to move separate from body as well as facilitation to relax head, trunk alignment and for activating available elbow extension.  All activities in closed chain. Transistioned to standing for unilateral low reach in closed chain actvity on moveable surface.  Pt with slow gains toward controlling isolated movement in LUE. Pt needs max cues and facilitation for grading.       Manual Therapy   Manual Therapy Joint  mobilization;Soft tissue mobilization;Myofascial release   Manual therapy comments Soft tissue, joint mob and myofascial release to address tightness and alignment in L shoulder girdle, eblow extension and ER prior to neuro re ed.                  OT Short Term Goals - 05/16/16 1100      OT SHORT TERM GOAL #1   Title Pt and family will be mod I with HEP - 04/22/2016   Status Achieved     OT SHORT TERM GOAL #2   Title Pt will demonstrate ability for PROM of shoulder fleixon to 130* in supine with no pain to aide in self care and as prep for low functional reach.   Status Achieved  03/12/2016 150* in supine 0/10     OT SHORT TERM GOAL #3   Title Pt will be mod I with bathing   Status Achieved     OT SHORT TERM GOAL #4   Title Pt will be mod I with tub bench transfers   Status Achieved     OT SHORT TERM GOAL #5   Title Pt will be mod I with tying shoes using AE   Status Achieved           OT Long Term  Goals - 05/16/16 1100      OT LONG TERM GOAL #1   Title Pt and family will be mod I with upgraded HEP- 05/20/2016   Status Achieved     OT LONG TERM GOAL #2   Title Pt will be mod I with simple familiar hot meal prep   Status Achieved     OT LONG TERM GOAL #3   Title Pt will demonstrate ability to use LUE as stabilizer during basic ADL's 50% of the time.   Status On-going     OT LONG TERM GOAL #4   Title Pt will demonstrate ability to employ 2 strategies to reduce tone in LUE in order to improve ability to use LUE as stabilizer in basic ADL's   Status Achieved               Plan - 05/16/16 1101    Clinical Impression Statement Pt progressing toward goals. Pt has limited visits and is now scheduled for botox injection for LUE.  Pt to be placed on hold to allow for injection and full impact of medication.   Rehab Potential Good   Clinical Impairments Affecting Rehab Potential decreased insight, severity of UE deficits   OT Frequency 2x / week   OT  Duration 8 weeks   OT Treatment/Interventions Self-care/ADL training;Moist Heat;Electrical Stimulation;DME and/or AE instruction;Neuromuscular education;Therapeutic exercise;Functional Mobility Training;Manual Therapy;Passive range of motion;Splinting;Therapeutic activities;Balance training;Cognitive remediation/compensation   Plan place on hold for botox, renew next visit   Consulted and Agree with Plan of Care Patient      Patient will benefit from skilled therapeutic intervention in order to improve the following deficits and impairments:     Visit Diagnosis: 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 Spastic hemiparesis of left dominant side (HCC) 05/13/2016  . 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 [address] [place], [place], [address] Phone: [phone]   Fax:  [phone]  Name: Adam Perez MRN: [id number] Date of Birth: [birth year]

## 2016-05-16 NOTE — Therapy (Signed)
[place] [address] [place], [place], [address] Phone: [phone]   Fax:  [phone]  Physical Therapy Treatment  Patient Details  Name: Adam Perez MRN: [id number] Date of Birth: [birth year] Referring Provider: Levada Schilling, MD  Encounter Date: 05/16/2016      PT End of Session - 05/16/16 1214    Visit Number 14   Number of Visits 16   Date for PT Re-Evaluation 05/27/16   Authorization Type [employer]-60 visit limit combined PT/OT   PT Start Time 1019   PT Stop Time 1105   PT Time Calculation (min) 46 min   Activity Tolerance Patient tolerated treatment well   Behavior During Therapy [facility] for tasks assessed/performed      History reviewed. No pertinent past medical history.  History reviewed. No pertinent surgical history.  Vitals:   05/16/16 1028  BP: (!) 149/102  Pulse: 69        Subjective Assessment - 05/16/16 1024    Subjective No issues over the weekend, performing stretches and sit <> stand at home this weekend.  Will be getting AFO and phenol injection in LLE next.   Patient is accompained by: Family member   Pertinent History HTN, PTSD   Limitations Standing;Walking;House hold activities   Patient Stated Goals improve mobility/walking, increase use of LUE for work   Currently in Pain? No/denies           [facility] Adult PT Treatment/Exercise - 05/16/16 1047      Neuro Re-ed    Neuro Re-ed Details  NMR in L sidelying with focus on WB and activation of L proximal stabilization mm during isometric activtion (initiation of side plank), stabilization through LUE while performing various reaching and rotation of RUE and R shoulder depression while relaxing head down to mat.  Also performed stabilization through LLE while performing RLE hip ABD, hip ABD with flexion<>extension.  Required max A at L trunk for shortening and cues to maintain neck flexion towards mat..  Continued NMR with alternating  stepping and gait x 50' with LUE on rolling table with focus on full lateral weight shifting, trunk elongation and shortening, decreased activation of neck and back extensors and full step length bilaterally and LLE activation in swing and stance phases with mod A of one person and improved ability to initiate L hip and knee flexion in swing today..            PT Education - 05/16/16 1213    Education provided Yes   Education Details Hold PT next week while awaiting AFO and phenol injection to save visits; will return to PT following week.   Person(s) Educated Patient   Methods Explanation   Comprehension Verbalized understanding          PT Short Term Goals - 04/23/16 1258      PT SHORT TERM GOAL #1   Title verbalize understanding of CVA risk factors/warning signs (Target Date for all STGs: 04/22/16)   Baseline achieved per OT note 04/22/16   Status Achieved     PT SHORT TERM GOAL #2   Title perform BERG with appropriate LTG to be written    Status Achieved     PT SHORT TERM GOAL #3   Title improve timed up and go to < 27 sec for improved mobility   Status Achieved     PT SHORT TERM GOAL #4   Title improve gait velocity to > 1.2 ft/sec for improved mobility  Status Achieved     PT SHORT TERM GOAL #5   Title Will improve BERG balance score to >45/56 to decrease risk for falls    Baseline 04/22/16: 41/56   Status Not Met           PT Long Term Goals - 04/17/16 2137      PT LONG TERM GOAL #1   Title independent with HEP (Target Date for all LTGs: 05/20/16)   Status On-going     PT LONG TERM GOAL #2   Title amb > 500' on various indoor/outdoor surfaces with LRAD (and possibly AFO) modified independent for improved community access   Status On-going     PT LONG TERM GOAL #3   Title improve timed up and go to < 20 sec for improved mobility    Status On-going     PT LONG TERM GOAL #4   Title improve gait velocity to > 1.8 ft/sec for decreased fall risk and  improved mobility   Status On-going               Plan - 05/16/16 1215    Clinical Impression Statement Continued NMR to focus on mm grading and decreasing activation of R side and increasing activation on L side in various positions in sidelying, standing, pre gait and gait training.  Pt demonstrating excellent carryover from previous session and pt able to ambulate with only LUE support today and one person assist.  Will continue to address when pt returns after receiving injection and AFO.   Rehab Potential Good   Clinical Impairments Affecting Rehab Potential cognition, severity of deficits   PT Treatment/Interventions ADLs/Self Care Home Management;Electrical Stimulation;Cryotherapy;Neuromuscular re-education;Balance training;Therapeutic exercise;Therapeutic activities;Functional mobility training;Stair training;Gait training;DME Instruction;Patient/family education;Orthotic Fit/Training;Vestibular;Taping   PT Next Visit Plan HOLD PT after next session x 1 week; when return assess gait with AFO and recert!  Set up lumbar roll in w/c with towel from home; Continue head and trunk righting during dynamic movement, NMR LLE and activation of hamstring (estim??), Body weight support on treadmill   Consulted and Agree with Plan of Care Patient      Patient will benefit from skilled therapeutic intervention in order to improve the following deficits and impairments:  Abnormal gait, Decreased activity tolerance, Decreased balance, Decreased cognition, Decreased mobility, Difficulty walking, Decreased strength, Impaired tone, Decreased knowledge of precautions, Decreased knowledge of use of DME  Visit Diagnosis: 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 Spastic hemiparesis of left dominant side  ([place]) 05/13/2016  . 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 [place], [place], [address] Phone: [phone]   Fax:  [phone]  Name: Adam Perez MRN: [id number] Date of Birth: 05-07-1955

## 2016-05-20 ENCOUNTER — Encounter: Payer: Self-pay | Admitting: Speech Pathology

## 2016-05-20 ENCOUNTER — Ambulatory Visit: Payer: Self-pay | Admitting: Physical Therapy

## 2016-05-20 ENCOUNTER — Encounter: Payer: Self-pay | Admitting: Occupational Therapy

## 2016-05-23 ENCOUNTER — Ambulatory Visit: Payer: Self-pay | Admitting: Physical Medicine & Rehabilitation

## 2016-05-23 ENCOUNTER — Encounter: Payer: Self-pay | Admitting: Physical Medicine & Rehabilitation

## 2016-05-23 ENCOUNTER — Ambulatory Visit (HOSPITAL_BASED_OUTPATIENT_CLINIC_OR_DEPARTMENT_OTHER): Payer: Managed Care, Other (non HMO) | Admitting: Physical Medicine & Rehabilitation

## 2016-05-23 VITALS — BP 148/96 | HR 80

## 2016-05-23 DIAGNOSIS — G8112 Spastic hemiplegia affecting left dominant side: Secondary | ICD-10-CM

## 2016-05-23 NOTE — Progress Notes (Signed)
    Botox Injection for spasticity using needle EMG guidance  Dilution: 50 Units/ml Indication: Severe spasticity which interferes with ADL,mobility and/or  hygiene and is unresponsive to medication management and other conservative care Informed consent was obtained after describing risks and benefits of the procedure with the patient. This includes bleeding, bruising, infection, excessive weakness, or medication side effects. A REMS form is on file and signed. Needle: 25g 2" needle electrode Number of units per muscle FCR 50 FCU 50 Biceps 150 Pectoralis 150 All injections were done after obtaining appropriate EMG activity and after negative drawback for blood. The patient tolerated the procedure well. Post procedure instructions were given. A followup appointment was made.

## 2016-05-23 NOTE — Patient Instructions (Addendum)
FCR 50 FCU 50 Biceps 150 Pectoralis 150  Muscles injected today

## 2016-05-27 ENCOUNTER — Ambulatory Visit: Payer: Managed Care, Other (non HMO) | Admitting: Physical Therapy

## 2016-05-30 ENCOUNTER — Encounter: Payer: Self-pay | Admitting: Physical Therapy

## 2016-05-30 ENCOUNTER — Ambulatory Visit: Payer: Managed Care, Other (non HMO) | Admitting: Physical Therapy

## 2016-05-30 ENCOUNTER — Encounter: Payer: Self-pay | Admitting: Occupational Therapy

## 2016-05-30 ENCOUNTER — Encounter: Payer: Self-pay | Admitting: Speech Pathology

## 2016-05-30 VITALS — BP 170/100 | HR 72

## 2016-05-30 DIAGNOSIS — M6281 Muscle weakness (generalized): Secondary | ICD-10-CM

## 2016-05-30 DIAGNOSIS — I69352 Hemiplegia and hemiparesis following cerebral infarction affecting left dominant side: Secondary | ICD-10-CM

## 2016-05-30 DIAGNOSIS — R2689 Other abnormalities of gait and mobility: Secondary | ICD-10-CM

## 2016-05-30 DIAGNOSIS — G8112 Spastic hemiplegia affecting left dominant side: Secondary | ICD-10-CM | POA: Diagnosis not present

## 2016-05-30 NOTE — Therapy (Signed)
[place] [address] [place], [place], [address] Phone: [phone]   Fax:  [phone]  Physical Therapy Treatment  Patient Details  Name: Adam Perez MRN: [id number] Date of Birth: May 24, 1955 Referring Provider: Levada Schilling, MD  Encounter Date: 05/30/2016      PT End of Session - 05/30/16 1215    Visit Number 15   Number of Visits 24  8 more visits per recertification   Date for PT Re-Evaluation 26/83/41  per recertification   Authorization Type [employer]-60 visit limit combined PT/OT   PT Start Time 1016   PT Stop Time 1100   PT Time Calculation (min) 44 min   Activity Tolerance Treatment limited secondary to medical complications (Comment)   Behavior During Therapy [facility] for tasks assessed/performed      History reviewed. No pertinent past medical history.  History reviewed. No pertinent surgical history.  Vitals:   05/30/16 1025 05/30/16 1032  BP: (!) 160/110 (!) 170/100  Pulse: 72         Subjective Assessment - 05/30/16 1033    Subjective Pt received AFO this week and received UE injections for spasticity last week.  BP high today; reports eating [employer]-dark meat last night   Pertinent History HTN, PTSD   Limitations Standing;Walking;House hold activities   Patient Stated Goals improve mobility/walking, increase use of LUE for work   Currently in Pain? No/denies            [facility] PT Assessment - 05/30/16 1055      Ambulation/Gait   Gait velocity 28 seconds or 1.24f/sec  with AFO and quad cane     Standardized Balance Assessment   Standardized Balance Assessment Timed Up and Go Test     Timed Up and Go Test   TUG Normal TUG   Normal TUG (seconds) 19           [facility] Adult PT Treatment/Exercise - 05/30/16 1055      Ambulation/Gait   Ambulation/Gait Yes   Ambulation/Gait Assistance 4: Min guard   Ambulation Distance (Feet) 75 Feet   Assistive device Small based quad cane  L AFO    Gait Pattern Step-through pattern;Decreased step length - right;Decreased step length - left;Decreased stance time - left;Decreased stride length;Decreased hip/knee flexion - left;Decreased dorsiflexion - left;Decreased weight shift to left;Decreased trunk rotation   Ambulation Surface Level;Indoor     Therapeutic Activites    Therapeutic Activities Other Therapeutic Activities   Other Therapeutic Activities Donning and wear of AFO                PT Education - 05/30/16 1214    Education provided Yes   Education Details BP management and discussion with [facility] MD and nutritionist regarding diet and HTN; AFO wear, POC   Person(s) Educated Patient   Methods Explanation   Comprehension Verbalized understanding          PT Short Term Goals - 05/30/16 1229      PT SHORT TERM GOAL #1   Title verbalize understanding of CVA risk factors/warning signs (Target Date for all STGs: 04/22/16)   Baseline achieved per OT note 04/22/16   Status Achieved     PT SHORT TERM GOAL #2   Title perform BERG with appropriate LTG to be written    Status Achieved     PT SHORT TERM GOAL #3   Title improve timed up and go to < 27 sec for improved mobility   Status Achieved  PT SHORT TERM GOAL #4   Title improve gait velocity to > 1.2 ft/sec for improved mobility   Status Achieved     PT SHORT TERM GOAL #5   Title Will improve BERG balance score to >45/56 to decrease risk for falls    Baseline 04/22/16: 41/56; made into LTG on 05/30/2016   Status Not Met           PT Long Term Goals - 05/30/16 1046      PT LONG TERM GOAL #1   Title independent with HEP (Target Date for all LTGs: 05/20/16) NEW TARGET DATE FOR LTG IS 06/29/2016   Status On-going     PT LONG TERM GOAL #2   Title amb > 500' on various indoor/outdoor surfaces with LRAD (and possibly AFO) modified independent for improved community access   Baseline TARGET 06/29/16   Status On-going     PT LONG TERM GOAL #3   Title improve  timed up and go to < 20 sec for improved mobility    Baseline MET: 19 seconds with AFO and quad cane   Status Achieved     PT LONG TERM GOAL #4   Title improve gait velocity to > 1.8 ft/sec for decreased fall risk and improved mobility   Baseline 28 seconds or 1.86f/sec; TARGET DATE 06/29/16   Status On-going     PT LONG TERM GOAL #5   Title Will improve TUG to <15 seconds for decreased falls risk with transitional movements (with AFO and quad cane)  set 05/30/2016   Baseline 19 seconds; TARGET DATE 06/29/16   Status New     Additional Long Term Goals   Additional Long Term Goals Yes     PT LONG TERM GOAL #6   Title Will improve BERG balance score to >45/56 to decrease risk for falls    Baseline 41/56; TARGET DATE 06/29/16   Status New               Plan - 05/30/16 1217    Clinical Impression Statement Pt is making good progress towards LTG and has met 1/4 LTG and has demonstrated improvements in balance, independence with transfers and gait and gait speed, postural control and decreased falls risk.  Pt has received his AFO and would benefit from ongoing PT services at 2x/week x 4 more weeks for continued NMR, gait, balance, motor control training to make further progress with independence with functional mobility and to further decrease falls risk.     Rehab Potential Good   Clinical Impairments Affecting Rehab Potential cognition, severity of deficits   PT Frequency 2x / week   PT Duration 4 weeks   PT Treatment/Interventions ADLs/Self Care Home Management;Electrical Stimulation;Cryotherapy;Neuromuscular re-education;Balance training;Therapeutic exercise;Therapeutic activities;Functional mobility training;Stair training;Gait training;DME Instruction;Patient/family education;Orthotic Fit/Training;Vestibular;Taping   PT Next Visit Plan Continue gait training and NMR L side with AFO, stair negotiation, hamstring activation   Consulted and Agree with Plan of Care Patient       Patient will benefit from skilled therapeutic intervention in order to improve the following deficits and impairments:  Abnormal gait, Decreased activity tolerance, Decreased balance, Decreased cognition, Decreased mobility, Difficulty walking, Decreased strength, Impaired tone, Decreased knowledge of precautions, Decreased knowledge of use of DME  Visit Diagnosis: 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 Spastic hemiparesis  of left dominant side ([place]) 05/13/2016  . 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

## 2016-06-03 ENCOUNTER — Ambulatory Visit: Payer: Managed Care, Other (non HMO) | Admitting: Physical Therapy

## 2016-06-03 VITALS — BP 150/90 | HR 78

## 2016-06-03 DIAGNOSIS — I69352 Hemiplegia and hemiparesis following cerebral infarction affecting left dominant side: Secondary | ICD-10-CM

## 2016-06-03 DIAGNOSIS — R2689 Other abnormalities of gait and mobility: Secondary | ICD-10-CM

## 2016-06-03 DIAGNOSIS — M6281 Muscle weakness (generalized): Secondary | ICD-10-CM

## 2016-06-03 DIAGNOSIS — G8112 Spastic hemiplegia affecting left dominant side: Secondary | ICD-10-CM | POA: Diagnosis not present

## 2016-06-03 NOTE — Therapy (Signed)
[facility] [address] [place], [place], [address] Phone: [phone]   Fax:  [phone]  Physical Therapy Treatment  Patient Details  Name: Adam Perez MRN: [id number] Date of Birth: November 06, 1955 Referring Provider: Levada Schilling, MD  Encounter Date: 06/03/2016      PT End of Session - 06/03/16 1307    Visit Number 16   Number of Visits 24   Date for PT Re-Evaluation 06/29/16   Authorization Type [employer]-60 visit limit combined PT/OT   PT Start Time 1147   PT Stop Time 1230   PT Time Calculation (min) 43 min   Activity Tolerance Patient tolerated treatment well   Behavior During Therapy [facility] for tasks assessed/performed      No past medical history on file.  No past surgical history on file.  Vitals:   06/03/16 1155 06/03/16 1156  BP: (!) 154/110 (!) 150/90  Pulse: 78         Subjective Assessment - 06/03/16 1159    Subjective No issues over the weekend-family assisting with AFO donning.   Pertinent History HTN, PTSD   Limitations Standing;Walking;House hold activities   Patient Stated Goals improve mobility/walking, increase use of LUE for work   Currently in Pain? No/denies           [facility] Adult PT Treatment/Exercise - 06/03/16 1257      Neuro Re-ed    Neuro Re-ed Details  Continued NMR in tall kneeling with focus on lateral weight shifting and lateral squats to L and R for increased ROM during lateral weight shifting with verbal and tactile cues to facilitate R trunk elongation and weight shift to advance LLE in tall kneeling.  Pt had to transition out of tall kneeling due to low back pain on R side.  Continued trunk control and weight shift training in sitting with L hip wedged and having pt reach up and to the R to facilitate R weight shift, R trunk elongation and L shortening.  Continued gait training with LUE support on rolling table x 115' with verbal, tactile and visual cues to maintain head  in midline, R shoulder depression, full lateral weight shift to L in stance for full step length with R and grading of activation on R side to allow LLE to initiate hip and knee flexion during swing phase.  As pt fatigued he began to c/o R lower back pain and demonstrated increased trunk extension, step to gait sequence and decreased hip and knee flexion on L.                 PT Education - 06/03/16 1304    Education provided Yes   Education Details overuse of R side and postural reasons for R low back pain   Person(s) Educated Patient   Methods Explanation   Comprehension Verbalized understanding          PT Short Term Goals - 05/30/16 1229      PT SHORT TERM GOAL #1   Title verbalize understanding of CVA risk factors/warning signs (Target Date for all STGs: 04/22/16)   Baseline achieved per OT note 04/22/16   Status Achieved     PT SHORT TERM GOAL #2   Title perform BERG with appropriate LTG to be written    Status Achieved     PT SHORT TERM GOAL #3   Title improve timed up and go to < 27 sec for improved mobility   Status Achieved  PT SHORT TERM GOAL #4   Title improve gait velocity to > 1.2 ft/sec for improved mobility   Status Achieved     PT SHORT TERM GOAL #5   Title Will improve BERG balance score to >45/56 to decrease risk for falls    Baseline 04/22/16: 41/56; made into LTG on 05/30/2016   Status Not Met           PT Long Term Goals - 05/30/16 1046      PT LONG TERM GOAL #1   Title independent with HEP (Target Date for all LTGs: 05/20/16) NEW TARGET DATE FOR LTG IS 06/29/2016   Status On-going     PT LONG TERM GOAL #2   Title amb > 500' on various indoor/outdoor surfaces with LRAD (and possibly AFO) modified independent for improved community access   Baseline TARGET 06/29/16   Status On-going     PT LONG TERM GOAL #3   Title improve timed up and go to < 20 sec for improved mobility    Baseline MET: 19 seconds with AFO and quad cane   Status  Achieved     PT LONG TERM GOAL #4   Title improve gait velocity to > 1.8 ft/sec for decreased fall risk and improved mobility   Baseline 28 seconds or 1.34f/sec; TARGET DATE 06/29/16   Status On-going     PT LONG TERM GOAL #5   Title Will improve TUG to <15 seconds for decreased falls risk with transitional movements (with AFO and quad cane)  set 05/30/2016   Baseline 19 seconds; TARGET DATE 06/29/16   Status New     Additional Long Term Goals   Additional Long Term Goals Yes     PT LONG TERM GOAL #6   Title Will improve BERG balance score to >45/56 to decrease risk for falls    Baseline 41/56; TARGET DATE 06/29/16   Status New               Plan - 06/03/16 1307    Clinical Impression Statement Continued to focus on NMR for increased L sided use and activation and grading of mm activation, motor control and motor planning in tall kneeling, sitting and with gait.  Pt continues to demonstrate increased use of compensatory, inefficient movement patterns-but is demonstrating improved awareness and ability to self-correct.  Will continue to address.   Clinical Impairments Affecting Rehab Potential cognition, severity of deficits   PT Treatment/Interventions ADLs/Self Care Home Management;Electrical Stimulation;Cryotherapy;Neuromuscular re-education;Balance training;Therapeutic exercise;Therapeutic activities;Functional mobility training;Stair training;Gait training;DME Instruction;Patient/family education;Orthotic Fit/Training;Vestibular;Taping   PT Next Visit Plan Continue gait training and NMR L side with AFO, stair negotiation, hamstring activation, decrease mm activation on R side-focus on elongation on R side   Consulted and Agree with Plan of Care Patient      Patient will benefit from skilled therapeutic intervention in order to improve the following deficits and impairments:  Abnormal gait, Decreased activity tolerance, Decreased balance, Decreased cognition, Decreased  mobility, Difficulty walking, Decreased strength, Impaired tone, Decreased knowledge of precautions, Decreased knowledge of use of DME  Visit Diagnosis: 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 Spastic hemiparesis of left dominant side ([place] 05/13/2016  . 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 [place], [place], [address] Phone: [phone]   Fax:  [phone]  Name: Adam Perez MRN: [id number] Date of Birth: August 15, 1955

## 2016-06-05 ENCOUNTER — Encounter: Payer: Self-pay | Admitting: Physical Therapy

## 2016-06-05 ENCOUNTER — Ambulatory Visit: Payer: Managed Care, Other (non HMO) | Admitting: Physical Therapy

## 2016-06-05 DIAGNOSIS — R2689 Other abnormalities of gait and mobility: Secondary | ICD-10-CM

## 2016-06-05 DIAGNOSIS — G8112 Spastic hemiplegia affecting left dominant side: Secondary | ICD-10-CM | POA: Diagnosis not present

## 2016-06-05 DIAGNOSIS — M6281 Muscle weakness (generalized): Secondary | ICD-10-CM

## 2016-06-05 DIAGNOSIS — I69352 Hemiplegia and hemiparesis following cerebral infarction affecting left dominant side: Secondary | ICD-10-CM

## 2016-06-05 NOTE — Therapy (Signed)
[facility] [address] [place], [place], [address] Phone: [phone]   Fax:  [phone]  Physical Therapy Treatment  Patient Details  Name: Adam Perez MRN: [id number] Date of Birth: 05-19-1955 Referring Provider: Levada Schilling, MD  Encounter Date: 06/05/2016      PT End of Session - 06/05/16 1259    Visit Number 17   Number of Visits 24   Date for PT Re-Evaluation 06/29/16   Authorization Type [employer]-60 visit limit combined PT/OT   PT Start Time 1102   PT Stop Time 1150   PT Time Calculation (min) 48 min   Activity Tolerance Patient tolerated treatment well   Behavior During Therapy [facility] for tasks assessed/performed      History reviewed. No pertinent past medical history.  History reviewed. No pertinent surgical history.  There were no vitals filed for this visit.      Subjective Assessment - 06/05/16 1106    Subjective No issues since last visit; no back pain today.  Wearing AFO part of the day, no areas of increased pressure after wearing AFO.   Pertinent History HTN, PTSD   Limitations Standing;Walking;House hold activities   Patient Stated Goals improve mobility/walking, increase use of LUE for work   Currently in Pain? No/denies           [facility] Adult PT Treatment/Exercise - 06/05/16 1124      Neuro Re-ed    Neuro Re-ed Details  Sit to stand training to facilitate increased weight shift and WB through LLE x 5 reps from mat with R foot wedged and RUE holding LUE on therapist's UE.  Pt cued verbally, visually and tactile for anterior lean and weight shift L during sit <> stand.  Following training pt able to perform regular sit > stand with COG more in midline.  Performed gait x 115' with RUE support on cane and therapist positioning LUE in flexion and ER to promote trunk elongation, lateral weight shift to L and cues for hip and knee flexion activation during swing phase.  Pt continues to  demonstrate improved step and stride length and improved L foot clearance.     Lumbar Exercises: Stretches   Standing Side Bend 2 reps;30 seconds  seated; bend to L and forwards to elongate R side     Knee/Hip Exercises: Supine   Bridges 5 reps;Right;Left   Bridges Limitations tactile cues for L activation   Single Leg Bridge Left;5 reps   Other Supine Knee/Hip Exercises Supine terminal L hip extension bridges with LLE off of mat and foot on 4" step x 5 reps with verbal and tactile cues to activate glutes and abdominals and decreased activation in back extensors            PT Education - 06/05/16 1258    Education provided Yes   Education Details sit <> stand in midline   Person(s) Educated Patient   Methods Explanation   Comprehension Verbal cues required;Tactile cues required;Need further instruction          PT Short Term Goals - 05/30/16 1229      PT SHORT TERM GOAL #1   Title verbalize understanding of CVA risk factors/warning signs (Target Date for all STGs: 04/22/16)   Baseline achieved per OT note 04/22/16   Status Achieved     PT SHORT TERM GOAL #2   Title perform BERG with appropriate LTG to be written    Status Achieved     PT SHORT  TERM GOAL #3   Title improve timed up and go to < 27 sec for improved mobility   Status Achieved     PT SHORT TERM GOAL #4   Title improve gait velocity to > 1.2 ft/sec for improved mobility   Status Achieved     PT SHORT TERM GOAL #5   Title Will improve BERG balance score to >45/56 to decrease risk for falls    Baseline 04/22/16: 41/56; made into LTG on 05/30/2016   Status Not Met           PT Long Term Goals - 05/30/16 1046      PT LONG TERM GOAL #1   Title independent with HEP (Target Date for all LTGs: 05/20/16) NEW TARGET DATE FOR LTG IS 06/29/2016   Status On-going     PT LONG TERM GOAL #2   Title amb > 500' on various indoor/outdoor surfaces with LRAD (and possibly AFO) modified independent for improved  community access   Baseline TARGET 06/29/16   Status On-going     PT LONG TERM GOAL #3   Title improve timed up and go to < 20 sec for improved mobility    Baseline MET: 19 seconds with AFO and quad cane   Status Achieved     PT LONG TERM GOAL #4   Title improve gait velocity to > 1.8 ft/sec for decreased fall risk and improved mobility   Baseline 28 seconds or 1.26f/sec; TARGET DATE 06/29/16   Status On-going     PT LONG TERM GOAL #5   Title Will improve TUG to <15 seconds for decreased falls risk with transitional movements (with AFO and quad cane)  set 05/30/2016   Baseline 19 seconds; TARGET DATE 06/29/16   Status New     Additional Long Term Goals   Additional Long Term Goals Yes     PT LONG TERM GOAL #6   Title Will improve BERG balance score to >45/56 to decrease risk for falls    Baseline 41/56; TARGET DATE 06/29/16   Status New               Plan - 06/05/16 1259    Clinical Impression Statement Treatment session today with focus on ROM on L side and motor planning for initiation of movement from pelvis instead of low back/head; continued NMR for LUE, trunk and LLE during sit <> stand and during gait.  Pt with decreased reports of LBP today.     Clinical Impairments Affecting Rehab Potential cognition, severity of deficits   PT Treatment/Interventions ADLs/Self Care Home Management;Electrical Stimulation;Cryotherapy;Neuromuscular re-education;Balance training;Therapeutic exercise;Therapeutic activities;Functional mobility training;Stair training;Gait training;DME Instruction;Patient/family education;Orthotic Fit/Training;Vestibular;Taping   PT Next Visit Plan Continue gait training and NMR L side with AFO, stair negotiation, hamstring activation, decrease mm activation on R side-focus on elongation on R side   Consulted and Agree with Plan of Care Patient      Patient will benefit from skilled therapeutic intervention in order to improve the following deficits and  impairments:  Abnormal gait, Decreased activity tolerance, Decreased balance, Decreased cognition, Decreased mobility, Difficulty walking, Decreased strength, Impaired tone, Decreased knowledge of precautions, Decreased knowledge of use of DME  Visit Diagnosis: 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 Spastic hemiparesis of left dominant side ([place] 05/13/2016  . Hemiparesis due  to old brainstem infarction [facility]) 05/13/2016   Raylene Everts, PT, DPT 06/05/16    1:03 PM    [facility] [address] [place], [place], [address] Phone: [phone]   Fax:  [phone]  Name: Adam Perez MRN: [id number] Date of Birth: 03-07-1956

## 2016-06-06 ENCOUNTER — Ambulatory Visit: Payer: Self-pay | Admitting: Physical Medicine & Rehabilitation

## 2016-06-11 ENCOUNTER — Ambulatory Visit: Payer: Managed Care, Other (non HMO) | Admitting: Occupational Therapy

## 2016-06-11 ENCOUNTER — Encounter: Payer: Self-pay | Admitting: Occupational Therapy

## 2016-06-11 ENCOUNTER — Encounter: Payer: Self-pay | Admitting: Physical Therapy

## 2016-06-11 ENCOUNTER — Ambulatory Visit: Payer: Managed Care, Other (non HMO) | Attending: Physical Medicine & Rehabilitation | Admitting: Physical Therapy

## 2016-06-11 VITALS — BP 149/87 | HR 73

## 2016-06-11 DIAGNOSIS — R2681 Unsteadiness on feet: Secondary | ICD-10-CM

## 2016-06-11 DIAGNOSIS — M25622 Stiffness of left elbow, not elsewhere classified: Secondary | ICD-10-CM | POA: Insufficient documentation

## 2016-06-11 DIAGNOSIS — R2689 Other abnormalities of gait and mobility: Secondary | ICD-10-CM

## 2016-06-11 DIAGNOSIS — I69952 Hemiplegia and hemiparesis following unspecified cerebrovascular disease affecting left dominant side: Secondary | ICD-10-CM | POA: Insufficient documentation

## 2016-06-11 DIAGNOSIS — I69315 Cognitive social or emotional deficit following cerebral infarction: Secondary | ICD-10-CM

## 2016-06-11 DIAGNOSIS — I69352 Hemiplegia and hemiparesis following cerebral infarction affecting left dominant side: Secondary | ICD-10-CM

## 2016-06-11 DIAGNOSIS — R293 Abnormal posture: Secondary | ICD-10-CM | POA: Insufficient documentation

## 2016-06-11 DIAGNOSIS — I679 Cerebrovascular disease, unspecified: Secondary | ICD-10-CM | POA: Insufficient documentation

## 2016-06-11 DIAGNOSIS — G8112 Spastic hemiplegia affecting left dominant side: Secondary | ICD-10-CM

## 2016-06-11 DIAGNOSIS — M25612 Stiffness of left shoulder, not elsewhere classified: Secondary | ICD-10-CM | POA: Insufficient documentation

## 2016-06-11 DIAGNOSIS — M6281 Muscle weakness (generalized): Secondary | ICD-10-CM

## 2016-06-11 NOTE — Therapy (Signed)
[facility] [address] [place], [place], [address] Phone: [phone]   Fax:  [phone]  Occupational Therapy Treatment  Patient Details  Name: Adam Perez MRN: [id number] Date of Birth: 1955-06-15 Referring Provider: Dr. Levada Schilling  Encounter Date: 06/11/2016      OT End of Session - 06/11/16 1229    Visit Number 14   Number of Visits 30   Date for OT Re-Evaluation 05/20/16   Authorization Type [employer], 60 PT/OT ,, no auth required.    Authorization - Visit Number 14   Authorization - Number of Visits 30   OT Start Time 8299   OT Stop Time 0930   OT Time Calculation (min) 43 min   Activity Tolerance Patient tolerated treatment well      History reviewed. No pertinent past medical history.  History reviewed. No pertinent surgical history.  Vitals:   06/11/16 0853  BP: (!) 149/87  Pulse: 73        Subjective Assessment - 06/11/16 0852    Subjective  I have my brace for my foot today   Pertinent History see epic, care everywhere. Pt with R CVA, HTN,    Patient Stated Goals I want to walk normally and be able to hold a cup without dropping it   Currently in Pain? No/denies                      OT Treatments/Exercises (OP) - 06/11/16 1205      Neurological Re-education Exercises   Other Exercises 1 Neuro re ed following manual therapy to address low to mid reach in sidelying with LUE on moveable surface.  Pt needs max cues and facilitation to discourage abnormal postural holding through head and shoulder girdle even in sidelying.  Transitioned into sitting to address bilateral reach low reach in closed chain with mod facilitation. Pt reported that he continues to pul arm up overhead when lying in bed with other arm (LUE is in flexion and IR when pt is doing this) Have discussed numerous times with pt importance of avoiding this activity and rationale which pt is able to verbalize however  he states "I guess I just want to get it going." Strongly discouraged this as the activity reinforces flexor synergy pattern, increased tone and increases malalignment of the shoulder girdle.  Pt able to verbalize and reviewed appropriate HEP activities again with pt who could return demonstrate. Will continue to reinforce.      Manual Therapy   Manual Therapy Joint mobilization;Soft tissue mobilization;Scapular mobilization   Manual therapy comments Pt returns after 3 week hold due to botox injection and limited visits.  Pt with some reduced tone from injection however pt did not consistently complete HEP while on hold despite repeated education and emphasis on importance.  Joint, soft tissue and scap mob to address alignment and prep to address low reach with LUE.  Pt with limited ER and pain with full elbow extension .  Some improvement in alignment however will need continued aggresssive manual therapy to address and improve pain free ROM.                 OT Education - 06/11/16 1217    Education provided Yes   Education Details again reinforced HEP and strongly encouraged pt to complete only activities given to him by therapy    Person(s) Educated Patient   Methods Explanation;Demonstration;Verbal cues;Tactile cues  pt has handout and video already  Comprehension Verbalized understanding;Returned demonstration          OT Short Term Goals - 06/11/16 1219      OT SHORT TERM GOAL #1   Title Pt and family will be mod I with HEP - 04/22/2016   Status Achieved     OT SHORT TERM GOAL #2   Title Pt will demonstrate ability for PROM of shoulder fleixon to 130* in supine with no pain to aide in self care and as prep for low functional reach.   Status Achieved  03/12/2016 150* in supine 0/10     OT SHORT TERM GOAL #3   Title Pt will be mod I with bathing   Status Achieved     OT SHORT TERM GOAL #4   Title Pt will be mod I with tub bench transfers   Status Achieved     OT SHORT  TERM GOAL #5   Title Pt will be mod I with tying shoes using AE   Status Achieved     OT SHORT TERM GOAL #6   Title Pt will demonstrate abilty to use LUE as stabilizer 25% of the time during basic ADL's- 07/09/2016   Status New     OT SHORT TERM GOAL #7   Title Pt will demonstrate ability for 30* of shoulder flexion for low reach with min facilitation with simple functional task   Status New           OT Long Term Goals - 06/11/16 1219      OT LONG TERM GOAL #1   Title Pt and family will be mod I with upgraded HEP- 05/20/2016   Status Achieved     OT LONG TERM GOAL #2   Title Pt will be mod I with simple familiar hot meal prep   Status Achieved     OT LONG TERM GOAL #3   Title Pt will demonstrate ability to use LUE as stabilizer during basic ADL's 50% of the time.   Status On-going     OT LONG TERM GOAL #4   Title Pt will demonstrate ability to employ 2 strategies to reduce tone in LUE in order to improve ability to use LUE as stabilizer in basic ADL's   Status Achieved     OT LONG TERM GOAL #5   Title Pt will be mod I with upgraded HEP - 08/06/2016   Status New     OT LONG TERM GOAL #6   Title Pt will demonstrate ability for grasp with LUE during basic self care activities with cues   Status New     OT LONG TERM GOAL #7   Title Pt will demonstrate ability for bilateral low reach to move light object with min compensations   Status New               Plan - 06/11/16 1228    Clinical Impression Statement Pt has met STG's and most LTG's.  Pt returns today after three week hold for botox injection. Will renew pt with new goals.    Rehab Potential Good   Clinical Impairments Affecting Rehab Potential decreased insight, severity of UE deficits   OT Frequency 2x / week   OT Duration 8 weeks   OT Treatment/Interventions Self-care/ADL training;Moist Heat;Electrical Stimulation;DME and/or AE instruction;Neuromuscular education;Therapeutic exercise;Functional Mobility  Training;Manual Therapy;Passive range of motion;Splinting;Therapeutic activities;Balance training;Cognitive remediation/compensation   Plan NMR for LUE, trunk, head, postural control, balance and functional mobillity   Consulted and Agree with Plan of  Care Patient      Patient will benefit from skilled therapeutic intervention in order to improve the following deficits and impairments:  Abnormal gait, Decreased activity tolerance, Decreased balance, Decreased cognition, Decreased knowledge of use of DME, Decreased mobility, Decreased range of motion, Decreased safety awareness, Difficulty walking, Decreased strength, Impaired UE functional use, Impaired tone  Visit Diagnosis: 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, unspecified cerebrovascular disease type ([place]) - Plan: Ot plan of care cert/re-cert  Unsteadiness on feet - Plan: Ot plan of care cert/re-cert  Abnormal posture - Plan: Ot plan of care cert/re-cert  Stiffness of left shoulder, not elsewhere classified - Plan: Ot plan of care cert/re-cert  Stiffness of left elbow, not elsewhere classified - Plan: Ot plan of care cert/re-cert  Cognitive social or emotional deficit following cerebral infarction - Plan: Ot plan of care cert/re-cert  Muscle weakness (generalized) - Plan: Ot plan of care cert/re-cert    Problem List Patient Active Problem List   Diagnosis Date Noted  . Spastic hemiparesis of left dominant side ([place]) 05/13/2016  . 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

## 2016-06-11 NOTE — Therapy (Signed)
[facility] [address] [place], [place], [address] Phone: [phone]   Fax:  [phone]  Physical Therapy Treatment  Patient Details  Name: Adam Perez MRN: [id number] Date of Birth: Mar 06, 1956 Referring Provider: Levada Schilling, MD  Encounter Date: 06/11/2016      PT End of Session - 06/11/16 1125    Visit Number 18   Number of Visits 24   Date for PT Re-Evaluation 06/29/16   Authorization Type [employer]-60 visit limit combined PT/OT   PT Start Time 0930   PT Stop Time 1015   PT Time Calculation (min) 45 min   Activity Tolerance Patient tolerated treatment well   Behavior During Therapy [facility] for tasks assessed/performed      History reviewed. No pertinent past medical history.  History reviewed. No pertinent surgical history.  There were no vitals filed for this visit.      Subjective Assessment - 06/11/16 1003    Subjective No back pain today; just finished with OT.  Re-inforced OT education regarding safe UE movement and use.  Pt reports getting on lawn mower this weekend to cut grass using quad cane to press the clutch and brake.   Pertinent History HTN, PTSD   Limitations Standing;Walking;House hold activities   Patient Stated Goals improve mobility/walking, increase use of LUE for work   Currently in Pain? No/denies           [facility] Adult PT Treatment/Exercise - 06/11/16 1005      Ambulation/Gait   Ambulation/Gait Yes   Ambulation/Gait Assistance 4: Min guard   Ambulation Distance (Feet) 75 Feet   Assistive device Small based quad cane   Gait Pattern Step-through pattern;Decreased stance time - left;Decreased hip/knee flexion - left;Decreased dorsiflexion - left;Decreased weight shift to right;Lateral trunk lean to right;Trunk rotated posteriorly on left   Ambulation Surface Level;Indoor   Gait Comments verbal, tactile and visual cues for lateral weight shift at trunk/pelvis and anterior rotation  of L pelvis to initiate swing phase     Neuro Re-ed    Neuro Re-ed Details  Continued NMR with sitting on rocker board performing lateral weight shifting to L and R with focus on decreasing initiation with head and intiate movement from trunk and pelvis with elongation and shortening.  Integrated lateral trunk elongation<>shortening and weight shifting into forward scooting on rocker board.  Performed gait and stair negotiation training x 2 initially leading with RLE to ascend and LLE to descend for safe/appropriate sequencing for home entry/exit; transitioned to negotiation leading with LLE to ascend and descend with focus on initiating advancement of LLE with anterior rotation of L pelvis for hip and knee flexion activation.  Performed stepping over obstacle forwards and backwards with LLE with therapist providing facilitation at pelvis and ribs for full R weight shift, anterior rotation of L pelvis and activation of hip and knee flexion on L.               PT Education - 06/11/16 1124    Education provided Yes   Education Details safety at home and risk for injury-discussed what activities are appropriate and not appropriate to attempt at this phase of recovery, NMR   Person(s) Educated Patient   Methods Explanation   Comprehension Verbalized understanding          PT Short Term Goals - 05/30/16 1229      PT SHORT TERM GOAL #1   Title verbalize understanding of CVA risk factors/warning signs (Target Date  for all STGs: 04/22/16)   Baseline achieved per OT note 04/22/16   Status Achieved     PT SHORT TERM GOAL #2   Title perform BERG with appropriate LTG to be written    Status Achieved     PT SHORT TERM GOAL #3   Title improve timed up and go to < 27 sec for improved mobility   Status Achieved     PT SHORT TERM GOAL #4   Title improve gait velocity to > 1.2 ft/sec for improved mobility   Status Achieved     PT SHORT TERM GOAL #5   Title Will improve BERG balance score to  >45/56 to decrease risk for falls    Baseline 04/22/16: 41/56; made into LTG on 05/30/2016   Status Not Met           PT Long Term Goals - 05/30/16 1046      PT LONG TERM GOAL #1   Title independent with HEP (Target Date for all LTGs: 05/20/16) NEW TARGET DATE FOR LTG IS 06/29/2016   Status On-going     PT LONG TERM GOAL #2   Title amb > 500' on various indoor/outdoor surfaces with LRAD (and possibly AFO) modified independent for improved community access   Baseline TARGET 06/29/16   Status On-going     PT LONG TERM GOAL #3   Title improve timed up and go to < 20 sec for improved mobility    Baseline MET: 19 seconds with AFO and quad cane   Status Achieved     PT LONG TERM GOAL #4   Title improve gait velocity to > 1.8 ft/sec for decreased fall risk and improved mobility   Baseline 28 seconds or 1.30f/sec; TARGET DATE 06/29/16   Status On-going     PT LONG TERM GOAL #5   Title Will improve TUG to <15 seconds for decreased falls risk with transitional movements (with AFO and quad cane)  set 05/30/2016   Baseline 19 seconds; TARGET DATE 06/29/16   Status New     Additional Long Term Goals   Additional Long Term Goals Yes     PT LONG TERM GOAL #6   Title Will improve BERG balance score to >45/56 to decrease risk for falls    Baseline 41/56; TARGET DATE 06/29/16   Status New               Plan - 06/11/16 1125    Clinical Impression Statement Treatment session continued to focus on NMR for initiation of movement at trunk and pelvis and carryover of NMR to transfers, gait and stair negotiation.  Pt continues to demonstrate good hip and knee flexion activation when initiating at L pelvis but demonstrates increased extensor tone when initiating with R lateral flexion/extension.  Pt continues to demonstrate poor awareness of deficits and safety awarenss as demonstrated by pt using quad cane on riding lawn mower over the weekend.  Had discussion with pt regarding return to  independence and what activities would be safe to peform and what activities place him at increased risk for injury.  Pt verbalized understanding.  Will continue to address and progress towards LTG.   Rehab Potential Good   Clinical Impairments Affecting Rehab Potential cognition, severity of deficits   PT Treatment/Interventions ADLs/Self Care Home Management;Electrical Stimulation;Cryotherapy;Neuromuscular re-education;Balance training;Therapeutic exercise;Therapeutic activities;Functional mobility training;Stair training;Gait training;DME Instruction;Patient/family education;Orthotic Fit/Training;Vestibular;Taping   PT Next Visit Plan Continue gait training and NMR L side with AFO, stair negotiation, hamstring activation, decrease  mm activation on R side-focus on elongation on R side   Consulted and Agree with Plan of Care Patient      Patient will benefit from skilled therapeutic intervention in order to improve the following deficits and impairments:  Abnormal gait, Decreased activity tolerance, Decreased balance, Decreased cognition, Decreased mobility, Difficulty walking, Decreased strength, Impaired tone, Decreased knowledge of precautions, Decreased knowledge of use of DME  Visit Diagnosis: 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 Spastic hemiparesis of left dominant side ([place]) 05/13/2016  . 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 [place], [place], [address] Phone: [phone]   Fax:  [phone]  Name: Adam Perez MRN: [id number] Date of Birth: Dec 13, 1955

## 2016-06-13 ENCOUNTER — Ambulatory Visit: Payer: Managed Care, Other (non HMO) | Admitting: Physical Therapy

## 2016-06-13 ENCOUNTER — Encounter: Payer: Self-pay | Admitting: Physical Therapy

## 2016-06-13 DIAGNOSIS — I69352 Hemiplegia and hemiparesis following cerebral infarction affecting left dominant side: Secondary | ICD-10-CM

## 2016-06-13 DIAGNOSIS — M6281 Muscle weakness (generalized): Secondary | ICD-10-CM

## 2016-06-13 DIAGNOSIS — R2689 Other abnormalities of gait and mobility: Secondary | ICD-10-CM

## 2016-06-13 NOTE — Therapy (Signed)
[facility] [address] [place], [place], [address] Phone: [phone]   Fax:  [phone]  Physical Therapy Treatment  Patient Details  Name: Adam Perez MRN: [id number] Date of Birth: Jul 11, 1955 Referring Provider: Levada Schilling, MD  Encounter Date: 06/13/2016      PT End of Session - 06/13/16 0953    Visit Number 19   Number of Visits 24   Date for PT Re-Evaluation 06/29/16   Authorization Type [employer]-60 visit limit combined PT/OT   PT Start Time 0852   PT Stop Time 0936   PT Time Calculation (min) 44 min   Activity Tolerance Patient tolerated treatment well   Behavior During Therapy Adam Perez for tasks assessed/performed      History reviewed. No pertinent past medical history.  History reviewed. No pertinent surgical history.  There were no vitals filed for this visit.      Subjective Assessment - 06/13/16 0926    Subjective No issues to report; "I was going to go to [place] today to see the [employer]."   Patient is accompained by: Family member   Pertinent History HTN, PTSD   Limitations Standing;Walking;House hold activities   Patient Stated Goals improve mobility/walking, increase use of LUE for work   Currently in Pain? No/denies          [facility] Adult PT Treatment/Exercise - 06/13/16 0947      Neuro Re-ed    Neuro Re-ed Details  Performed NMR to continue to focus on activation with L pelvis anterior rotation and hamstring activation during resisted sit > stand x 5 reps, standing resisted pelvic rotations + LLE taps to 4" step and LLE on 4" step and performing lateral weight shifting and activation of LLE to advance RLE from behind step <> in front of step with RUE support-verbal and tactile cues to minimize R lateral lean and to facilitate R trunk elongation.  Continued gait training forwards and retro at counter top and then with quad cane x 115' with min A and verbal/tactile cues for initiation of L hip  flexion and knee flexion during swing phase and full weight shift and weight acceptance in stance phase for full RLE step length.  Pt to start leaving w/c either in car or in waiting room and ambulating to/from gym with cane.     Lumbar Exercises: Supine   Bridge Non-compliant;15 reps  LLE only, LE off side of mat on step, terminal hip ext   Bridge Limitations Focus on terminal hip extension and anterior pelvic rotation with hamstring activation; verbal and tactile cues requried for core activation and gluteal activation to prevent pt compensating with lumbar extension.             PT Education - 06/13/16 0953    Education provided Yes   Education Details NMR   Person(s) Educated Patient   Methods Tactile cues;Verbal cues   Comprehension Need further instruction          PT Short Term Goals - 05/30/16 1229      PT SHORT TERM GOAL #1   Title verbalize understanding of CVA risk factors/warning signs (Target Date for all STGs: 04/22/16)   Baseline achieved per OT note 04/22/16   Status Achieved     PT SHORT TERM GOAL #2   Title perform BERG with appropriate LTG to be written    Status Achieved     PT SHORT TERM GOAL #3   Title improve timed up and go to <  27 sec for improved mobility   Status Achieved     PT SHORT TERM GOAL #4   Title improve gait velocity to > 1.2 ft/sec for improved mobility   Status Achieved     PT SHORT TERM GOAL #5   Title Will improve BERG balance score to >45/56 to decrease risk for falls    Baseline 04/22/16: 41/56; made into LTG on 05/30/2016   Status Not Met           PT Long Term Goals - 05/30/16 1046      PT LONG TERM GOAL #1   Title independent with HEP (Target Date for all LTGs: 05/20/16) NEW TARGET DATE FOR LTG IS 06/29/2016   Status On-going     PT LONG TERM GOAL #2   Title amb > 500' on various indoor/outdoor surfaces with LRAD (and possibly AFO) modified independent for improved community access   Baseline TARGET 06/29/16   Status  On-going     PT LONG TERM GOAL #3   Title improve timed up and go to < 20 sec for improved mobility    Baseline MET: 19 seconds with AFO and quad cane   Status Achieved     PT LONG TERM GOAL #4   Title improve gait velocity to > 1.8 ft/sec for decreased fall risk and improved mobility   Baseline 28 seconds or 1.71f/sec; TARGET DATE 06/29/16   Status On-going     PT LONG TERM GOAL #5   Title Will improve TUG to <15 seconds for decreased falls risk with transitional movements (with AFO and quad cane)  set 05/30/2016   Baseline 19 seconds; TARGET DATE 06/29/16   Status New     Additional Long Term Goals   Additional Long Term Goals Yes     PT LONG TERM GOAL #6   Title Will improve BERG balance score to >45/56 to decrease risk for falls    Baseline 41/56; TARGET DATE 06/29/16   Status New               Plan - 06/13/16 0953    Clinical Impression Statement Continued to focus on NMR of LLE to decrease synergistic movement and tone and increase activation at pelvis, L hip flexor and hamstring for more efficient gait sequence.  Pt tolerating well and ready to begin ambulating into/out of gym and decrease use of w/c.  Will continue to address.   Clinical Impairments Affecting Rehab Potential cognition, severity of deficits   PT Treatment/Interventions ADLs/Self Care Home Management;Electrical Stimulation;Cryotherapy;Neuromuscular re-education;Balance training;Therapeutic exercise;Therapeutic activities;Functional mobility training;Stair training;Gait training;DME Instruction;Patient/family education;Orthotic Fit/Training;Vestibular;Taping   PT Next Visit Plan Have pt walk from waiting room-leave w/c in waiting room! Try treadmill training with harness; Continue gait training and NMR L side with AFO, stair negotiation, hamstring activation, decrease mm activation on R side-focus on elongation on R side   Consulted and Agree with Plan of Care Patient      Patient will benefit from  skilled therapeutic intervention in order to improve the following deficits and impairments:  Abnormal gait, Decreased activity tolerance, Decreased balance, Decreased cognition, Decreased mobility, Difficulty walking, Decreased strength, Impaired tone, Decreased knowledge of precautions, Decreased knowledge of use of DME  Visit Diagnosis: 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 Spastic hemiparesis of left dominant side ([place]  05/13/2016  . 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 [place], [place], [address] Phone: [phone]   Fax:  [phone]  Name: Adam Perez MRN: [id number] Date of Birth: 04-May-1955

## 2016-06-17 ENCOUNTER — Encounter: Payer: Self-pay | Admitting: Occupational Therapy

## 2016-06-17 ENCOUNTER — Ambulatory Visit: Payer: Managed Care, Other (non HMO) | Admitting: Occupational Therapy

## 2016-06-17 ENCOUNTER — Ambulatory Visit: Payer: Managed Care, Other (non HMO) | Admitting: Physical Therapy

## 2016-06-17 ENCOUNTER — Encounter: Payer: Self-pay | Admitting: Physical Therapy

## 2016-06-17 VITALS — BP 135/93 | HR 72

## 2016-06-17 DIAGNOSIS — R2689 Other abnormalities of gait and mobility: Secondary | ICD-10-CM

## 2016-06-17 DIAGNOSIS — I69315 Cognitive social or emotional deficit following cerebral infarction: Secondary | ICD-10-CM

## 2016-06-17 DIAGNOSIS — M6281 Muscle weakness (generalized): Secondary | ICD-10-CM

## 2016-06-17 DIAGNOSIS — I679 Cerebrovascular disease, unspecified: Principal | ICD-10-CM

## 2016-06-17 DIAGNOSIS — M25612 Stiffness of left shoulder, not elsewhere classified: Secondary | ICD-10-CM

## 2016-06-17 DIAGNOSIS — R293 Abnormal posture: Secondary | ICD-10-CM

## 2016-06-17 DIAGNOSIS — I69352 Hemiplegia and hemiparesis following cerebral infarction affecting left dominant side: Secondary | ICD-10-CM

## 2016-06-17 DIAGNOSIS — G8112 Spastic hemiplegia affecting left dominant side: Secondary | ICD-10-CM

## 2016-06-17 DIAGNOSIS — R2681 Unsteadiness on feet: Secondary | ICD-10-CM

## 2016-06-17 DIAGNOSIS — M25622 Stiffness of left elbow, not elsewhere classified: Secondary | ICD-10-CM

## 2016-06-17 NOTE — Therapy (Signed)
[facility] [address] [place], [place], [address] Phone: [phone]   Fax:  [phone]  Occupational Therapy Treatment  Patient Details  Name: Adam Perez MRN: [id number] Date of Birth: 1955-10-23 Referring Provider: Dr. Renato Gails  Encounter Date: 06/17/2016      OT End of Session - 06/17/16 1202    Visit Number 15   Number of Visits 16   Date for OT Re-Evaluation 05/20/16   Authorization Type [employer], 60 PT/OT ,, no auth required.    Authorization - Visit Number 15   Authorization - Number of Visits 30   OT Start Time 0845   OT Stop Time 0929   OT Time Calculation (min) 44 min   Activity Tolerance Patient tolerated treatment well      History reviewed. No pertinent past medical history.  History reviewed. No pertinent surgical history.  There were no vitals filed for this visit.      Subjective Assessment - 06/17/16 0850    Subjective  Today I am walking back into the clinic instead of using my wheelchair   Pertinent History see epic, care everywhere. Pt with R CVA, HTN,    Patient Stated Goals I want to walk normally and be able to hold a cup without dropping it   Currently in Pain? No/denies                      OT Treatments/Exercises (OP) - 06/17/16 0001      Neurological Re-education Exercises   Other Exercises 1 Pt issued yellow theraputty to begin to address controlled grasp and release as well as beginnng in hand manipulation of items. Pt needs cues and needs to use RUE to assist with in hand manipulation however is able to use L hand as well.  Neuro re ed to address decreasing tone through improved alignment, active weight bearing with RUE in position of beginning ER, elbow extention, wrist extension and open hand with RUE to the side and reaching across body with RUE for functional task. Progressed to pt  going from sitting to squatting with first BUE's in front and in weight  bearing with elbow extension/wrist extension and then progressing to only using LUE to support upper body weight while forward reaching with RUE for functional task.  Pt requires mod facilitation and prep for alignment and tone reduction initially and then min facilitation for isolated control.                  OT Education - 06/17/16 1157    Education provided Yes   Education Details Yellow putty for grasp and release. beginning in hand manipulation   Person(s) Educated Patient   Methods Explanation;Demonstration;Tactile cues;Verbal cues;Handout   Comprehension Verbalized understanding;Returned demonstration          OT Short Term Goals - 06/17/16 1157      OT SHORT TERM GOAL #1   Title Pt and family will be mod I with HEP - 04/22/2016   Status Achieved     OT SHORT TERM GOAL #2   Title Pt will demonstrate ability for PROM of shoulder fleixon to 130* in supine with no pain to aide in self care and as prep for low functional reach.   Status Achieved  03/12/2016 150* in supine 0/10     OT SHORT TERM GOAL #3   Title Pt will be mod I with bathing   Status Achieved     OT  SHORT TERM GOAL #4   Title Pt will be mod I with tub bench transfers   Status Achieved     OT SHORT TERM GOAL #5   Title Pt will be mod I with tying shoes using AE   Status Achieved     OT SHORT TERM GOAL #6   Title Pt will demonstrate abilty to use LUE as stabilizer 25% of the time during basic ADL's- 07/09/2016   Status On-going     OT SHORT TERM GOAL #7   Title Pt will demonstrate ability for 30* of shoulder flexion for low reach with min facilitation with simple functional task   Status On-going           OT Long Term Goals - 06/17/16 1158      OT LONG TERM GOAL #1   Title Pt and family will be mod I with upgraded HEP- 05/20/2016   Status Achieved     OT LONG TERM GOAL #2   Title Pt will be mod I with simple familiar hot meal prep   Status Achieved     OT LONG TERM GOAL #3   Title Pt  will demonstrate ability to use LUE as stabilizer during basic ADL's 50% of the time.   Status On-going     OT LONG TERM GOAL #4   Title Pt will demonstrate ability to employ 2 strategies to reduce tone in LUE in order to improve ability to use LUE as stabilizer in basic ADL's   Status Achieved     OT LONG TERM GOAL #5   Title Pt will be mod I with upgraded HEP - 08/06/2016   Status On-going     OT LONG TERM GOAL #6   Title Pt will demonstrate ability for grasp with LUE during basic self care activities with cues   Status On-going     OT LONG TERM GOAL #7   Title Pt will demonstrate ability for bilateral low reach to move light object with min compensations   Status On-going               Plan - 06/17/16 1158    Clinical Impression Statement Pt progressing toward goals. Pt with slowly improving tone and isolated elbow extension in LUE   Rehab Potential Good   Clinical Impairments Affecting Rehab Potential decreased insight, severity of UE deficits   OT Frequency 2x / week   OT Duration 8 weeks   OT Treatment/Interventions Self-care/ADL training;Moist Heat;Electrical Stimulation;DME and/or AE instruction;Neuromuscular education;Therapeutic exercise;Functional Mobility Training;Manual Therapy;Passive range of motion;Splinting;Therapeutic activities;Balance training;Cognitive remediation/compensation   Plan NMR for LUE, trunk, head, postural control, balance and functional mobility   Consulted and Agree with Plan of Care Patient      Patient will benefit from skilled therapeutic intervention in order to improve the following deficits and impairments:  Abnormal gait, Decreased activity tolerance, Decreased balance, Decreased cognition, Decreased knowledge of use of DME, Decreased mobility, Decreased range of motion, Decreased safety awareness, Difficulty walking, Decreased strength, Impaired UE functional use, Impaired tone  Visit Diagnosis: 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 Spastic hemiparesis of left dominant side (HCC) 05/13/2016  . 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

## 2016-06-17 NOTE — Patient Instructions (Addendum)
1. Grip Strengthening (Resistive Putty)  SIT AT A TABLE TO DO THIS AND REST YOUR LEFT FOREARM ON THE TABLE.  YOU ARE ONLY WORKING ON SQUEEZING AND THEN MOVING IT AROUND IN YOUR HAND.  YOU CAN USE YOUR RIGHT HAND TO HELP HOLD IT WHILE YOU MOVE IT AROUND IN YOUR LEFT HAND.  DO NOT DO ANYTHING ELSE WITH IT!!   Squeeze putty using thumb and all fingers. Repeat _10___ times. Do __2__ sessions per day.        Copyright  VHI. All rights reserved.

## 2016-06-17 NOTE — Therapy (Signed)
[place]  [address] [place], [place], [address] Phone: [phone]   Fax:  [phone]  Physical Therapy Treatment  Patient Details  Name: Adam Perez MRN: [id number] Date of Birth: May 30, 1955 Referring Provider: Levada Schilling, MD  Encounter Date: 06/17/2016      PT End of Session - 06/17/16 1114    Visit Number 20   Number of Visits 24   Date for PT Re-Evaluation 06/29/16   Authorization Type [employer]-60 visit limit combined PT/OT   PT Start Time 0930   PT Stop Time 1019   PT Time Calculation (min) 49 min   Activity Tolerance Patient tolerated treatment well   Behavior During Therapy [facility] for tasks assessed/performed      History reviewed. No pertinent past medical history.  History reviewed. No pertinent surgical history.  Vitals:   06/17/16 0934  BP: (!) 135/93  Pulse: 72        Subjective Assessment - 06/17/16 0936    Subjective No issues to report over the weekend; watched the golf tournament over the weekend.  Just finished with OT; wantes to encourage increased UE extension   Patient is accompained by: Family member   Pertinent History HTN, PTSD   Limitations Standing;Walking;House hold activities   Patient Stated Goals improve mobility/walking, increase use of LUE for work   Currently in Pain? No/denies            [facility] Adult PT Treatment/Exercise - 06/17/16 1751      Ambulation/Gait   Ambulation/Gait Yes   Ambulation/Gait Assistance 4: Min assist   Ambulation/Gait Assistance Details Performed NMR during gait with walking poles to facilitate LUE extension, ER and increased trunk activation and stability; pt required assistance to sequence LUE advancement with RLE advancement, R trunk elongation and weight shift and initiation of L swing phase with L pelvis anterior rotation and L hip and knee flexion during swing phase.   Ambulation Distance (Feet) 200 Feet   Assistive device Other  (Comment)  bilat walking poles   Gait Pattern Step-through pattern;Decreased hip/knee flexion - left;Decreased stance time - left;Decreased dorsiflexion - left;Decreased weight shift to right;Trunk rotated posteriorly on left;Poor foot clearance - left   Ambulation Surface Level;Indoor   Stairs Yes   Stairs Assistance 4: Min assist   Stairs Assistance Details (indicate cue type and reason) Stair negotiation for further NMR for LLE with pt ascending and descending leading with LLE and LUE support on rail.  Pt provided with manual and verbal cues for R trunk elongation during R lateral weight shift and initiation of LLE advancement with L pelvis anterior rotation and hip and knee flexion; pt also performed activation of LLE extensors to advance RLE to same step.     Stair Management Technique Two rails;Step to pattern;Forwards   Number of Stairs 8   Height of Stairs 6     Neuro Re-ed    Neuro Re-ed Details  NMR in L sidelying with focus on L trunk shortening during closed chain hip ABD and L lateral neck flexion to perform partial side plank; after 10 reps added in RLE ABD to isolate LLE and then propped up on L elbow for increased extension activation through LUE, 12 reps total.  Required max-total verbal and tactile cues to sequence.  Continued NMR with gait with walking sticks; see gait section and stair negotiation.  At end of session pt ambulated back to bathroom and waiting room with quad cane and manual facilitation at R  shoulder for depression and L pelvis anterior rotation and extension with UE extension and ER.                  PT Education - 06/17/16 1114    Education provided Yes   Education Details NMR   Person(s) Educated Patient   Methods Tactile cues;Verbal cues   Comprehension Need further instruction          PT Short Term Goals - 05/30/16 1229      PT SHORT TERM GOAL #1   Title verbalize understanding of CVA risk factors/warning signs (Target Date for all STGs:  04/22/16)   Baseline achieved per OT note 04/22/16   Status Achieved     PT SHORT TERM GOAL #2   Title perform BERG with appropriate LTG to be written    Status Achieved     PT SHORT TERM GOAL #3   Title improve timed up and go to < 27 sec for improved mobility   Status Achieved     PT SHORT TERM GOAL #4   Title improve gait velocity to > 1.2 ft/sec for improved mobility   Status Achieved     PT SHORT TERM GOAL #5   Title Will improve BERG balance score to >45/56 to decrease risk for falls    Baseline 04/22/16: 41/56; made into LTG on 05/30/2016   Status Not Met           PT Long Term Goals - 05/30/16 1046      PT LONG TERM GOAL #1   Title independent with HEP (Target Date for all LTGs: 05/20/16) NEW TARGET DATE FOR LTG IS 06/29/2016   Status On-going     PT LONG TERM GOAL #2   Title amb > 500' on various indoor/outdoor surfaces with LRAD (and possibly AFO) modified independent for improved community access   Baseline TARGET 06/29/16   Status On-going     PT LONG TERM GOAL #3   Title improve timed up and go to < 20 sec for improved mobility    Baseline MET: 19 seconds with AFO and quad cane   Status Achieved     PT LONG TERM GOAL #4   Title improve gait velocity to > 1.8 ft/sec for decreased fall risk and improved mobility   Baseline 28 seconds or 1.55f/sec; TARGET DATE 06/29/16   Status On-going     PT LONG TERM GOAL #5   Title Will improve TUG to <15 seconds for decreased falls risk with transitional movements (with AFO and quad cane)  set 05/30/2016   Baseline 19 seconds; TARGET DATE 06/29/16   Status New     Additional Long Term Goals   Additional Long Term Goals Yes     PT LONG TERM GOAL #6   Title Will improve BERG balance score to >45/56 to decrease risk for falls    Baseline 41/56; TARGET DATE 06/29/16   Status New               Plan - 06/17/16 1114    Clinical Impression Statement Continued NMR for L activation, with focus on extension, proximal  hip activation and UE extension and ER, during sidelying hip ABD in closed chain, gait with walking poles and stair negotiation.  Pt tolerated well and able to progress stair negotiation to min A with no assistance needed to fully advance LLE.  Pt also demonstrating progress by ambulating to/from waiting room with cane and without w/c.  Will continue to  address over next 4 visits.     Clinical Impairments Affecting Rehab Potential cognition, severity of deficits   PT Treatment/Interventions ADLs/Self Care Home Management;Electrical Stimulation;Cryotherapy;Neuromuscular re-education;Balance training;Therapeutic exercise;Therapeutic activities;Functional mobility training;Stair training;Gait training;DME Instruction;Patient/family education;Orthotic Fit/Training;Vestibular;Taping   PT Next Visit Plan Have pt walk from waiting room-leave w/c in waiting room! Try treadmill training with harness; Continue gait training and NMR L side with AFO, stair negotiation, hamstring activation, decrease mm activation on R side-focus on elongation on R side   Consulted and Agree with Plan of Care Patient      Patient will benefit from skilled therapeutic intervention in order to improve the following deficits and impairments:  Abnormal gait, Decreased activity tolerance, Decreased balance, Decreased cognition, Decreased mobility, Difficulty walking, Decreased strength, Impaired tone, Decreased knowledge of precautions, Decreased knowledge of use of DME  Visit Diagnosis: 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 Spastic hemiparesis of left dominant side ([place]) 05/13/2016  . 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 [place], [place], [address] Phone: [phone]   Fax:  [phone]  Name: Adam Perez MRN: [id number] Date of Birth: Oct 10, 1955

## 2016-06-20 ENCOUNTER — Ambulatory Visit: Payer: Managed Care, Other (non HMO) | Admitting: Occupational Therapy

## 2016-06-20 ENCOUNTER — Ambulatory Visit: Payer: Managed Care, Other (non HMO) | Admitting: Physical Therapy

## 2016-06-20 ENCOUNTER — Encounter: Payer: Self-pay | Admitting: Occupational Therapy

## 2016-06-20 VITALS — BP 150/103 | HR 75

## 2016-06-20 DIAGNOSIS — M25622 Stiffness of left elbow, not elsewhere classified: Secondary | ICD-10-CM

## 2016-06-20 DIAGNOSIS — I69352 Hemiplegia and hemiparesis following cerebral infarction affecting left dominant side: Secondary | ICD-10-CM

## 2016-06-20 DIAGNOSIS — I679 Cerebrovascular disease, unspecified: Principal | ICD-10-CM

## 2016-06-20 DIAGNOSIS — M25612 Stiffness of left shoulder, not elsewhere classified: Secondary | ICD-10-CM

## 2016-06-20 DIAGNOSIS — I69315 Cognitive social or emotional deficit following cerebral infarction: Secondary | ICD-10-CM

## 2016-06-20 DIAGNOSIS — R2681 Unsteadiness on feet: Secondary | ICD-10-CM

## 2016-06-20 DIAGNOSIS — R293 Abnormal posture: Secondary | ICD-10-CM

## 2016-06-20 DIAGNOSIS — M6281 Muscle weakness (generalized): Secondary | ICD-10-CM

## 2016-06-20 DIAGNOSIS — G8112 Spastic hemiplegia affecting left dominant side: Secondary | ICD-10-CM

## 2016-06-20 NOTE — Therapy (Signed)
[facility] [address] [place], [place], [address] Phone: [phone]   Fax:  [phone]  Occupational Therapy Treatment  Patient Details  Name: Adam Perez MRN: [id number] Date of Birth: April 30, 1955 Referring Provider: Dr. Renato Gails  Encounter Date: 06/20/2016      OT End of Session - 06/20/16 2536    Authorization - Number of Visits 30      History reviewed. No pertinent past medical history.  History reviewed. No pertinent surgical history.  Vitals:   06/20/16 0808  BP: (!) 150/103  Pulse: 75        Subjective Assessment - 06/20/16 0806    Subjective  I have alot of bad habits when I walk don't I   Pertinent History see epic, care everywhere. Pt with R CVA, HTN,    Patient Stated Goals I want to walk normally and be able to hold a cup without dropping it   Currently in Pain? No/denies         PT arrived today and diastolic BP significantly elevated (150/103).  Manual BP with sitting at rest 146/101.  After 5 minute rest BP 148/101.  Education provided to pt about risk for high BP. Pt stated he saw his primary MD at the [place] earlier this week and MD ordered new BP med. Pt states "I have it in a bag but I haven't started it yet."  Pt could not state why. Upon further questioning pt is not sure if he is supposed to take new medication in addition to or in place of current BP med.  BP readings provided to pt in writing as well as instructions in writing to clarify this with the MD when pt calls this am to report elevated BP.  Discussed importance of taking meds correctly and danger of taking them incorrectly if he is unsure of how to use this new medication.  Pt verbalized understanding and stated he would clarify when he calls today.  Session terminated due to elevated BP at rest.                         OT Short Term Goals - 06/20/16 0836      OT SHORT TERM GOAL #1   Title Pt and  family will be mod I with HEP - 04/22/2016   Status Achieved     OT SHORT TERM GOAL #2   Title Pt will demonstrate ability for PROM of shoulder fleixon to 130* in supine with no pain to aide in self care and as prep for low functional reach.   Status Achieved  03/12/2016 150* in supine 0/10     OT SHORT TERM GOAL #3   Title Pt will be mod I with bathing   Status Achieved     OT SHORT TERM GOAL #4   Title Pt will be mod I with tub bench transfers   Status Achieved     OT SHORT TERM GOAL #5   Title Pt will be mod I with tying shoes using AE   Status Achieved     OT SHORT TERM GOAL #6   Title Pt will demonstrate abilty to use LUE as stabilizer 25% of the time during basic ADL's- 07/09/2016   Status On-going     OT SHORT TERM GOAL #7   Title Pt will demonstrate ability for 30* of shoulder flexion for low reach with min facilitation with simple functional task  Status On-going           OT Long Term Goals - 06/20/16 0836      OT LONG TERM GOAL #1   Title Pt and family will be mod I with upgraded HEP- 05/20/2016   Status Achieved     OT LONG TERM GOAL #2   Title Pt will be mod I with simple familiar hot meal prep   Status Achieved     OT LONG TERM GOAL #3   Title Pt will demonstrate ability to use LUE as stabilizer during basic ADL's 50% of the time.   Status On-going     OT LONG TERM GOAL #4   Title Pt will demonstrate ability to employ 2 strategies to reduce tone in LUE in order to improve ability to use LUE as stabilizer in basic ADL's   Status Achieved     OT LONG TERM GOAL #5   Title Pt will be mod I with upgraded HEP - 08/06/2016   Status On-going     OT LONG TERM GOAL #6   Title Pt will demonstrate ability for grasp with LUE during basic self care activities with cues   Status On-going     OT LONG TERM GOAL #7   Title Pt will demonstrate ability for bilateral low reach to move light object with min compensations   Status On-going               Plan -  06/20/16 0836    Clinical Impression Statement Pt with significantly elevated diastolic BP today (150/103).  See progress note for reinforced education with pt.  Pt to follow up today with MD at [facility]   Rehab Potential Good   Clinical Impairments Affecting Rehab Potential decreased insight, severity of UE deficits   OT Frequency 2x / week   OT Duration 8 weeks   OT Treatment/Interventions Self-care/ADL training;Moist Heat;Electrical Stimulation;DME and/or AE instruction;Neuromuscular education;Therapeutic exercise;Functional Mobility Training;Manual Therapy;Passive range of motion;Splinting;Therapeutic activities;Balance training;Cognitive remediation/compensation   Plan CHECK BP!!!, NMR fro LUE, trunk, head, postrural control, balance and functional mobility   Consulted and Agree with Plan of Care Patient      Patient will benefit from skilled therapeutic intervention in order to improve the following deficits and impairments:  Abnormal gait, Decreased activity tolerance, Decreased balance, Decreased cognition, Decreased knowledge of use of DME, Decreased mobility, Decreased range of motion, Decreased safety awareness, Difficulty walking, Decreased strength, Impaired UE functional use, Impaired tone  Visit Diagnosis: 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, unspecified cerebrovascular disease type (HCC)  Unsteadiness on feet  Abnormal posture  Stiffness of left shoulder, not elsewhere classified  Stiffness of left elbow, not elsewhere classified  Cognitive social or emotional deficit following cerebral infarction  Hemiplegia and hemiparesis following cerebral infarction affecting left dominant side (HCC)  Muscle weakness (generalized)    Problem List Patient Active Problem List   Diagnosis Date Noted  . Spastic hemiparesis of left dominant side (HCC) 05/13/2016  . 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 [place], [place], [address] Phone: [phone]   Fax:  [phone]  Name: Adam Perez MRN: [id number] Date of Birth: 1955-06-16

## 2016-06-24 ENCOUNTER — Ambulatory Visit: Payer: Managed Care, Other (non HMO) | Admitting: Rehabilitation

## 2016-06-24 ENCOUNTER — Ambulatory Visit: Payer: Managed Care, Other (non HMO) | Admitting: Occupational Therapy

## 2016-06-27 ENCOUNTER — Encounter: Payer: Self-pay | Admitting: Physical Therapy

## 2016-06-27 ENCOUNTER — Ambulatory Visit: Payer: Managed Care, Other (non HMO) | Admitting: Physical Therapy

## 2016-06-27 ENCOUNTER — Telehealth: Payer: Self-pay | Admitting: Physical Therapy

## 2016-06-27 VITALS — BP 140/95 | HR 71

## 2016-06-27 DIAGNOSIS — M6281 Muscle weakness (generalized): Secondary | ICD-10-CM

## 2016-06-27 DIAGNOSIS — I69352 Hemiplegia and hemiparesis following cerebral infarction affecting left dominant side: Secondary | ICD-10-CM

## 2016-06-27 DIAGNOSIS — R2689 Other abnormalities of gait and mobility: Secondary | ICD-10-CM

## 2016-06-27 NOTE — Telephone Encounter (Signed)
He should be following up with me the end of this month for Botox reassessment. I would reinject in the middle of June.

## 2016-06-27 NOTE — Therapy (Signed)
[place] [address] [place], [place], [address] Phone: [phone]   Fax:  [phone]  Physical Therapy Treatment  Patient Details  Name: Adam Perez MRN: [id number] Date of Birth: 1955-05-30 Referring Provider: Levada Schilling, MD  Encounter Date: 06/27/2016      PT End of Session - 06/27/16 1057    Visit Number 21   Number of Visits 24   Date for PT Re-Evaluation 06/29/16   Authorization Type [employer]-60 visit limit combined PT/OT   PT Start Time 0849   PT Stop Time 0934   PT Time Calculation (min) 45 min   Activity Tolerance Patient tolerated treatment well   Behavior During Therapy [facility] for tasks assessed/performed      History reviewed. No pertinent past medical history.  History reviewed. No pertinent surgical history.  Vitals:   06/27/16 0854  BP: (!) 140/95  Pulse: 71  SpO2: 98%        Subjective Assessment - 06/27/16 0900    Subjective Pt started taking new BP medication 3 days ago and did get clarification from MD to stop taking previous medication.  No issues to report.  Did not bring w/c into waiting room today.   Patient is accompained by: Family member   Pertinent History HTN, PTSD   Limitations Standing;Walking;House hold activities   Patient Stated Goals improve mobility/walking, increase use of LUE for work   Currently in Pain? No/denies                         [facility] Adult PT Treatment/Exercise - 06/27/16 1049      Ambulation/Gait   Ambulation/Gait Yes   Ambulation/Gait Assistance 5: Supervision   Ambulation/Gait Assistance Details gait from/to waiting room with verbal and intermittent tactile cues to maintain head in midline, full weight shift, WB and stance time on LLE and pelvic rotation on L to initiate hip and knee flexion in swing phase   Ambulation Distance (Feet) 200 Feet   Assistive device Small based quad cane   Gait Pattern Step-through  pattern;Decreased step length - right;Decreased step length - left;Decreased stance time - right;Decreased stance time - left;Decreased stride length;Decreased hip/knee flexion - left;Decreased dorsiflexion - left;Decreased weight shift to left;Decreased trunk rotation;Trunk rotated posteriorly on left;Wide base of support;Abducted - left   Ambulation Surface Level;Indoor     Neuro Re-ed    Neuro Re-ed Details  Continued NMR on treadmill at 0.16mh with visual feedback on treadmill screen for full and equal step length.  Pt required max-total tactile and verbal cues for full step length and heel strike with bilat LE, upright trunk, lateral weight shifting and to initiate LLE advancement with pelvic rotation.  Pt educated on need for full step length on RLE to allow increased stance time and full hip extension on LLE to initiate hip and knee flexion and full stance time on RLE to allow full step length with LLE.                  PT Education - 06/27/16 1057    Education provided Yes   Education Details NMR and gait sequence   Person(s) Educated Patient   Methods Explanation;Demonstration   Comprehension Need further instruction          PT Short Term Goals - 05/30/16 1229      PT SHORT TERM GOAL #1   Title verbalize understanding of CVA risk factors/warning signs (Target Date for all  STGs: 04/22/16)   Baseline achieved per OT note 04/22/16   Status Achieved     PT SHORT TERM GOAL #2   Title perform BERG with appropriate LTG to be written    Status Achieved     PT SHORT TERM GOAL #3   Title improve timed up and go to < 27 sec for improved mobility   Status Achieved     PT SHORT TERM GOAL #4   Title improve gait velocity to > 1.2 ft/sec for improved mobility   Status Achieved     PT SHORT TERM GOAL #5   Title Will improve BERG balance score to >45/56 to decrease risk for falls    Baseline 04/22/16: 41/56; made into LTG on 05/30/2016   Status Not Met           PT Long  Term Goals - 05/30/16 1046      PT LONG TERM GOAL #1   Title independent with HEP (Target Date for all LTGs: 05/20/16) NEW TARGET DATE FOR LTG IS 06/29/2016   Status On-going     PT LONG TERM GOAL #2   Title amb > 500' on various indoor/outdoor surfaces with LRAD (and possibly AFO) modified independent for improved community access   Baseline TARGET 06/29/16   Status On-going     PT LONG TERM GOAL #3   Title improve timed up and go to < 20 sec for improved mobility    Baseline MET: 19 seconds with AFO and quad cane   Status Achieved     PT LONG TERM GOAL #4   Title improve gait velocity to > 1.8 ft/sec for decreased fall risk and improved mobility   Baseline 28 seconds or 1.43f/sec; TARGET DATE 06/29/16   Status On-going     PT LONG TERM GOAL #5   Title Will improve TUG to <15 seconds for decreased falls risk with transitional movements (with AFO and quad cane)  set 05/30/2016   Baseline 19 seconds; TARGET DATE 06/29/16   Status New     Additional Long Term Goals   Additional Long Term Goals Yes     PT LONG TERM GOAL #6   Title Will improve BERG balance score to >45/56 to decrease risk for falls    Baseline 41/56; TARGET DATE 06/29/16   Status New               Plan - 06/27/16 1057    Clinical Impression Statement Continued gait and NMR training on treadmill with use of harness to allow therapist to provide increased cues and facilitation.  Pt continues to demonstrate increased hypertonicity in LLE and decreased ability to isolate hip and knee flexion and pelvic rotation during gait on treadmill and overground.  Will re-assess LTG next session and may put on hold until pt receives phenol injection from physiatrist and then recert for 4-8 more visits (as insurance allows).     Rehab Potential Good   Clinical Impairments Affecting Rehab Potential cognition, severity of deficits   PT Treatment/Interventions ADLs/Self Care Home Management;Electrical  Stimulation;Cryotherapy;Neuromuscular re-education;Balance training;Therapeutic exercise;Therapeutic activities;Functional mobility training;Stair training;Gait training;DME Instruction;Patient/family education;Orthotic Fit/Training;Vestibular;Taping   PT Next Visit Plan Have pt walk from waiting room-leave w/c in waiting room! re-assess LTG-contact Dr. KRaliegh Ipto determine when pt receiving phenol injections.  May put on hold and recert for 4-8 more visits once injection completed.   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 Spastic hemiparesis of left dominant side ([place]) 05/13/2016  . 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 [place], [place], [address] Phone: [phone]   Fax:  [phone]  Name: Adam Perez MRN: [id number] Date of Birth: Jun 15, 1955

## 2016-06-27 NOTE — Telephone Encounter (Signed)
Hello Dr. Wynn Banker, I have been working with Mr. Gulyas and he is getting close to using up his 60 visit limit combined for PT and OT.  Are you still planning on doing the injection for the LLE and if so, does he have an appointment set up for that yet?  I am hoping to save a few visits for more gait training after the injection and was trying to make a plan for recertification.    Thanks, Edman Circle, PT, DPT 06/27/16    1:14 PM

## 2016-07-01 ENCOUNTER — Ambulatory Visit: Payer: Managed Care, Other (non HMO) | Admitting: Physical Therapy

## 2016-07-01 ENCOUNTER — Ambulatory Visit: Payer: Managed Care, Other (non HMO) | Admitting: Occupational Therapy

## 2016-07-01 ENCOUNTER — Encounter: Payer: Self-pay | Admitting: Physical Therapy

## 2016-07-01 VITALS — BP 160/110 | HR 73

## 2016-07-01 VITALS — BP 152/104 | HR 67

## 2016-07-01 DIAGNOSIS — I69352 Hemiplegia and hemiparesis following cerebral infarction affecting left dominant side: Secondary | ICD-10-CM

## 2016-07-01 DIAGNOSIS — R2689 Other abnormalities of gait and mobility: Secondary | ICD-10-CM

## 2016-07-01 DIAGNOSIS — M6281 Muscle weakness (generalized): Secondary | ICD-10-CM

## 2016-07-01 NOTE — Telephone Encounter (Signed)
Thank you for the update.  I worked with Mr. [place] today and he said he has an appointment with you Thursday for an injection so I just wanted to clarify with you.  Is Thursday's appointment just a re-assessment or will you be doing a botox injection that day or is it still set for June?  It would be extremely beneficial for him to have a LE injection this week if possible because the tone is the biggest barrier to progress with gait right now.    Thank you for the clarification, Edman Circle, PT, DPT 07/01/16    4:31 PM

## 2016-07-01 NOTE — Telephone Encounter (Signed)
Last injection March 2018, too early for repeat

## 2016-07-01 NOTE — Therapy (Signed)
[facility] [address] [place], [place], [address] Phone: [phone]   Fax:  [phone]  Occupational Therapy Treatment  Patient Details  Name: Adam Perez MRN: [id number] Date of Birth: 04-Mar-1956 Referring Provider: Dr. Renato Gails  Encounter Date: 07/01/2016      OT End of Session - 07/01/16 0952    Visit Number --  no visit charged today   Number of Visits 16   Date for OT Re-Evaluation 05/20/16   Authorization Type [employer], 60 PT/OT ,, no auth required.    OT Start Time 0932   OT Stop Time 0950   OT Time Calculation (min) 18 min      No past medical history on file.  No past surgical history on file.  Vitals:   07/01/16 0938  BP: (!) 152/104  Pulse: 67        Subjective Assessment - 07/01/16 0950    Subjective  pt reports that he took BP meds late today (when arrived for PT) and that PCP gave him new BP med last week (but not sure if it is in addition or instead of old med, has only been taking new med).   Pertinent History see epic, care everywhere. Pt with R CVA, HTN,    Patient Stated Goals I want to walk normally and be able to hold a cup without dropping it   Currently in Pain? No/denies           Pt presents with elevated BP (152/104), but otherwise asymptomatic.  (also see PT note)  Reviewed signs/symptoms of CVA (pt denies symptoms currently) and to call 911 immediately  if pt experiences symptoms.  Recommended pt continue to monitor BP at home.  Reviewed importance of HTN as risk factor for CVA.  Instructed pt to call MD for clarification about meds and to report high BP (as pt reports that he has been taking only new med, but not sure if he should also take old med).  Recommended pt take meds as soon as he wakes up to allow incr time to work prior to therapy appts and to monitor BP at home approx 1 hour prior to next appt and to call if BP is high.  Pt verbalized understanding.   Session ended due to elevated BP at rest.                      OT Short Term Goals - 06/20/16 0836      OT SHORT TERM GOAL #1   Title Pt and family will be mod I with HEP - 04/22/2016   Status Achieved     OT SHORT TERM GOAL #2   Title Pt will demonstrate ability for PROM of shoulder fleixon to 130* in supine with no pain to aide in self care and as prep for low functional reach.   Status Achieved  03/12/2016 150* in supine 0/10     OT SHORT TERM GOAL #3   Title Pt will be mod I with bathing   Status Achieved     OT SHORT TERM GOAL #4   Title Pt will be mod I with tub bench transfers   Status Achieved     OT SHORT TERM GOAL #5   Title Pt will be mod I with tying shoes using AE   Status Achieved     OT SHORT TERM GOAL #6   Title Pt will demonstrate abilty to use LUE as stabilizer  25% of the time during basic ADL's- 07/09/2016   Status On-going     OT SHORT TERM GOAL #7   Title Pt will demonstrate ability for 30* of shoulder flexion for low reach with min facilitation with simple functional task   Status On-going           OT Long Term Goals - 06/20/16 0836      OT LONG TERM GOAL #1   Title Pt and family will be mod I with upgraded HEP- 05/20/2016   Status Achieved     OT LONG TERM GOAL #2   Title Pt will be mod I with simple familiar hot meal prep   Status Achieved     OT LONG TERM GOAL #3   Title Pt will demonstrate ability to use LUE as stabilizer during basic ADL's 50% of the time.   Status On-going     OT LONG TERM GOAL #4   Title Pt will demonstrate ability to employ 2 strategies to reduce tone in LUE in order to improve ability to use LUE as stabilizer in basic ADL's   Status Achieved     OT LONG TERM GOAL #5   Title Pt will be mod I with upgraded HEP - 08/06/2016   Status On-going     OT LONG TERM GOAL #6   Title Pt will demonstrate ability for grasp with LUE during basic self care activities with cues   Status On-going     OT LONG TERM  GOAL #7   Title Pt will demonstrate ability for bilateral low reach to move light object with min compensations   Status On-going               Plan - 07/01/16 4098    Clinical Impression Statement BP elevated today (152/104); therefore, did not proceed with OT today.  See note for education.  Pt to follow up with MD at [facility] today and monitor BP at home.   Rehab Potential Good   Clinical Impairments Affecting Rehab Potential decreased insight, severity of UE deficits   OT Frequency 2x / week   OT Duration 8 weeks   OT Treatment/Interventions Self-care/ADL training;Moist Heat;Electrical Stimulation;DME and/or AE instruction;Neuromuscular education;Therapeutic exercise;Functional Mobility Training;Manual Therapy;Passive range of motion;Splinting;Therapeutic activities;Balance training;Cognitive remediation/compensation   Plan CHECK BP!, neuro re-ed LUE, trunk, head, postural control, balance and functional mobility   Consulted and Agree with Plan of Care Patient      Patient will benefit from skilled therapeutic intervention in order to improve the following deficits and impairments:  Abnormal gait, Decreased activity tolerance, Decreased balance, Decreased cognition, Decreased knowledge of use of DME, Decreased mobility, Decreased range of motion, Decreased safety awareness, Difficulty walking, Decreased strength, Impaired UE functional use, Impaired tone  Visit Diagnosis: Hemiplegia and hemiparesis following cerebral infarction affecting left dominant side [facility])    Problem List Patient Active Problem List   Diagnosis Date Noted  . Spastic hemiparesis of left dominant side (HCC) 05/13/2016  . 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 [place], [place], [address] Phone: [phone]   Fax:  [phone]  Name: Adam Perez MRN:  [id number] Date of Birth: 09/05/1955   Willa Frater, OTR/L [facility] [address] [place], [place]  [address] [phone] phone [phone] 07/01/16 10:01 AM

## 2016-07-01 NOTE — Therapy (Signed)
[place] [address] [place], [place], [address] Phone: [phone]   Fax:  [phone]  Physical Therapy Note  Patient Details  Name: Adam Perez MRN: [id number] Date of Birth: [birth year] Referring Provider: Levada Schilling, MD  Encounter Date: 07/01/2016   Pt arrived, no charge for visit due to elevated BP   History reviewed. No pertinent past medical history.  History reviewed. No pertinent surgical history.  Vitals:   07/01/16 0906  BP: (!) 160/110  Pulse: 73  SpO2: 98%        Subjective Assessment - 07/01/16 0900    Subjective Pt reporting he has appointment with physiatrist this Thursday for botox injection.  Reports taking BP medication 30 min prior to appointment this am; not sure why BP is elevated.   Patient is accompained by: Family member   Pertinent History HTN, PTSD   Limitations Standing;Walking;House hold activities   Patient Stated Goals improve mobility/walking, increase use of LUE for work   Currently in Pain? No/denies           PT Short Term Goals - 05/30/16 1229      PT SHORT TERM GOAL #1   Title verbalize understanding of CVA risk factors/warning signs (Target Date for all STGs: 04/22/16)   Baseline achieved per OT note 04/22/16   Status Achieved     PT SHORT TERM GOAL #2   Title perform BERG with appropriate LTG to be written    Status Achieved     PT SHORT TERM GOAL #3   Title improve timed up and go to < 27 sec for improved mobility   Status Achieved     PT SHORT TERM GOAL #4   Title improve gait velocity to > 1.2 ft/sec for improved mobility   Status Achieved     PT SHORT TERM GOAL #5   Title Will improve BERG balance score to >45/56 to decrease risk for falls    Baseline 04/22/16: 41/56; made into LTG on 05/30/2016   Status Not Met           PT Long Term Goals - 05/30/16 1046      PT LONG TERM GOAL #1   Title independent with HEP (Target Date for all LTGs:  05/20/16) NEW TARGET DATE FOR LTG IS 06/29/2016   Status On-going     PT LONG TERM GOAL #2   Title amb > 500' on various indoor/outdoor surfaces with LRAD (and possibly AFO) modified independent for improved community access   Baseline TARGET 06/29/16   Status On-going     PT LONG TERM GOAL #3   Title improve timed up and go to < 20 sec for improved mobility    Baseline MET: 19 seconds with AFO and quad cane   Status Achieved     PT LONG TERM GOAL #4   Title improve gait velocity to > 1.8 ft/sec for decreased fall risk and improved mobility   Baseline 28 seconds or 1.20f/sec; TARGET DATE 06/29/16   Status On-going     PT LONG TERM GOAL #5   Title Will improve TUG to <15 seconds for decreased falls risk with transitional movements (with AFO and quad cane)  set 05/30/2016   Baseline 19 seconds; TARGET DATE 06/29/16   Status New     Additional Long Term Goals   Additional Long Term Goals Yes     PT LONG TERM GOAL #6   Title Will improve BERG balance score to >45/56  to decrease risk for falls    Baseline 41/56; TARGET DATE 06/29/16   Status New               Plan - 07/01/16 0914    Clinical Impression Statement Pt arrived for visit and ambulated from car > waiting area and then from waiting area > treatment gym.  Performed assessment of BP with hypertension noted; pt reports taking BP medication 30 min prior to appointment.  Unable to participate in today's PT session to re-assess LTG and make plan for recertification once pt receives botox injection to the LLE due to elevated BP.  Pt to wait in waiting area x 30 minutes to see if BP will be low enough for pt to participate in OT session at 9:30.  Pt to return on Thursday for re-assessment and re-certification.   PT Next Visit Plan re-assess LTG; if pt receiving botox injections in LLE today then recert for 4-6 more visits; if not until June-recert for that visit only and hold PT and have pt return in June      Patient will  benefit from skilled therapeutic intervention in order to improve the following deficits and impairments:     Visit Diagnosis: 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 Spastic hemiparesis of left dominant side ([place]) 05/13/2016  . 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 [place], [place], [address] Phone: [phone]   Fax:  [phone]  Name: Adam Perez MRN: [id number] Date of Birth: 28-Jun-1955

## 2016-07-03 NOTE — Telephone Encounter (Signed)
Ok, thank you.  I will let him know tomorrow morning when I see him.  We will plan to hold and re-certify pt for a few more weeks of therapy after his LE botox injections in June.  Thank you for the update.  Edman Circle, PT, DPT 07/03/16    6:52 PM

## 2016-07-04 ENCOUNTER — Ambulatory Visit: Payer: Managed Care, Other (non HMO) | Admitting: Physical Therapy

## 2016-07-04 ENCOUNTER — Ambulatory Visit: Payer: Managed Care, Other (non HMO) | Admitting: Occupational Therapy

## 2016-07-04 ENCOUNTER — Encounter: Payer: Self-pay | Attending: Physical Medicine & Rehabilitation

## 2016-07-04 ENCOUNTER — Ambulatory Visit (HOSPITAL_BASED_OUTPATIENT_CLINIC_OR_DEPARTMENT_OTHER): Payer: Self-pay | Admitting: Physical Medicine & Rehabilitation

## 2016-07-04 ENCOUNTER — Encounter: Payer: Self-pay | Admitting: Physical Therapy

## 2016-07-04 ENCOUNTER — Encounter: Payer: Self-pay | Admitting: Physical Medicine & Rehabilitation

## 2016-07-04 VITALS — BP 140/90

## 2016-07-04 VITALS — BP 135/85 | HR 61

## 2016-07-04 VITALS — BP 143/91 | HR 77

## 2016-07-04 DIAGNOSIS — M6281 Muscle weakness (generalized): Secondary | ICD-10-CM

## 2016-07-04 DIAGNOSIS — I69315 Cognitive social or emotional deficit following cerebral infarction: Secondary | ICD-10-CM

## 2016-07-04 DIAGNOSIS — I69952 Hemiplegia and hemiparesis following unspecified cerebrovascular disease affecting left dominant side: Secondary | ICD-10-CM

## 2016-07-04 DIAGNOSIS — G8112 Spastic hemiplegia affecting left dominant side: Secondary | ICD-10-CM | POA: Insufficient documentation

## 2016-07-04 DIAGNOSIS — R2689 Other abnormalities of gait and mobility: Secondary | ICD-10-CM

## 2016-07-04 DIAGNOSIS — R2681 Unsteadiness on feet: Secondary | ICD-10-CM

## 2016-07-04 DIAGNOSIS — M25622 Stiffness of left elbow, not elsewhere classified: Secondary | ICD-10-CM

## 2016-07-04 DIAGNOSIS — M25612 Stiffness of left shoulder, not elsewhere classified: Secondary | ICD-10-CM

## 2016-07-04 DIAGNOSIS — I69352 Hemiplegia and hemiparesis following cerebral infarction affecting left dominant side: Secondary | ICD-10-CM

## 2016-07-04 DIAGNOSIS — R293 Abnormal posture: Secondary | ICD-10-CM

## 2016-07-04 NOTE — Therapy (Signed)
[place] [address] [place], [place], [address] Phone: [phone]   Fax:  [phone]  Physical Therapy Treatment  Patient Details  Name: Adam Perez MRN: [id number] Date of Birth: 1955/07/09 Referring Provider: Levada Schilling, MD  Encounter Date: 07/04/2016      PT End of Session - 07/04/16 1216    Visit Number 22   Number of Visits 30  per recertification   Date for PT Re-Evaluation 09/02/16   Authorization Type [employer]-60 visit limit combined PT/OT   Authorization - Visit Number 63   Authorization - Number of Visits 30   PT Start Time 1016   PT Stop Time 1101   PT Time Calculation (min) 45 min   Activity Tolerance Patient tolerated treatment well   Behavior During Therapy [facility] for tasks assessed/performed      History reviewed. No pertinent past medical history.  History reviewed. No pertinent surgical history.  Vitals:   07/04/16 1026  BP: 140/90        Subjective Assessment - 07/04/16 1024    Subjective Pt asking if PT had contacted physiatrist about injection; unable to re-inject until June.  Pt reports he was able to tie his shoe on his own the other day.  Pt reports MD has him taking both BP medication; took at 7:30 this am.   Patient is accompained by: Family member   Pertinent History HTN, PTSD   Limitations Standing;Walking;House hold activities   Patient Stated Goals improve mobility/walking, increase use of LUE for work   Currently in Pain? No/denies            [facility] PT Assessment - 07/04/16 1031      Standardized Balance Assessment   Standardized Balance Assessment Berg Balance Test;Timed Up and Go Test;10 meter walk test   10 Meter Walk 23.50 seconds or 1.39 ft/sec     Berg Balance Test   Sit to Stand Able to stand without using hands and stabilize independently   Standing Unsupported Able to stand safely 2 minutes   Sitting with Back Unsupported but Feet Supported on Floor  or Stool Able to sit safely and securely 2 minutes   Stand to Sit Sits safely with minimal use of hands   Transfers Able to transfer safely, minor use of hands   Standing Unsupported with Eyes Closed Able to stand 10 seconds safely   Standing Ubsupported with Feet Together Able to place feet together independently and stand 1 minute safely   From Standing, Reach Forward with Outstretched Arm Can reach confidently >25 cm (10")   From Standing Position, Pick up Object from Floor Able to pick up shoe safely and easily   From Standing Position, Turn to Look Behind Over each Shoulder Looks behind from both sides and weight shifts well   Turn 360 Degrees Needs close supervision or verbal cueing   Standing Unsupported, Alternately Place Feet on Step/Stool Able to complete >2 steps/needs minimal assist   Standing Unsupported, One Foot in Front Able to take small step independently and hold 30 seconds   Standing on One Leg Able to lift leg independently and hold equal to or more than 3 seconds   Total Score 46   Berg comment: 46/56     Timed Up and Go Test   TUG Normal TUG   Normal TUG (seconds) 16.69  PT Short Term Goals - 05/30/16 1229      PT SHORT TERM GOAL #1   Title verbalize understanding of CVA risk factors/warning signs (Target Date for all STGs: 04/22/16)   Baseline achieved per OT note 04/22/16   Status Achieved     PT SHORT TERM GOAL #2   Title perform BERG with appropriate LTG to be written    Status Achieved     PT SHORT TERM GOAL #3   Title improve timed up and go to < 27 sec for improved mobility   Status Achieved     PT SHORT TERM GOAL #4   Title improve gait velocity to > 1.2 ft/sec for improved mobility   Status Achieved     PT SHORT TERM GOAL #5   Title Will improve BERG balance score to >45/56 to decrease risk for falls    Baseline 04/22/16: 41/56; made into LTG on 05/30/2016   Status Not Met           PT  Long Term Goals - 07/04/16 1103      PT LONG TERM GOAL #1   Title independent with HEP (Target Date for all LTGs: 05/20/16; 06/29/2016)   Baseline NEW TARGET DATE FOR REMAINING LTG IS 09/02/16    Time 8   Period Weeks   Status On-going     PT LONG TERM GOAL #2   Title amb > 500' on various indoor/outdoor surfaces with LRAD and AFO at supervision for improved community access   Baseline TARGET 09/02/16   Time 8   Period Weeks   Status Revised     PT LONG TERM GOAL #4   Title improve gait velocity to > 1.8 ft/sec for decreased fall risk and improved mobility   Baseline 28 seconds or 1.40f/sec; 1.39 ft/sec on 07/04/16   Time 8   Period Weeks   Status On-going     PT LONG TERM GOAL #5   Title Will improve TUG to <13 seconds for decreased falls risk with transitional movements (with AFO and quad cane)   Baseline 19 seconds; 16 seconds on 07/04/16   Time 8   Period Weeks   Status Revised     Additional Long Term Goals   Additional Long Term Goals Yes     PT LONG TERM GOAL #6   Title Will improve BERG balance score to >45/56 to decrease risk for falls    Baseline 41/56; TARGET DATE 06/29/16; 46/56 MET on 07/04/16   Status Achieved     PT LONG TERM GOAL #7   Title Pt will decrease falls risk as indicated by improvement in BERG balance score to 50/56   Baseline 46/56; TARGET DATE 09/02/16   Time 8   Period Weeks   Status New               Plan - 07/04/16 1220    Clinical Impression Statement Pt with improved BP today and able to participate in full session.  Treatment session today with focus on re-assessment of pt progress and LTG.  Pt has made improvements in balance and safety with gait and pt did meet BERG goal.  Pt did not meet TUG or gait velocity goal but pt did make progress with each.  Pt continues to require education and cues on correct technique for HEP exercises and have been unable to assess gait goal over outdoor surfaces.  Pt is making progress with gait and is  able to ambulate in and out  of clinic and treatment area with quad cane and supervision.  Pt is scheduled to have LLE botox injections in June.  Will recertify pt for more visits but will place pt on treatment hold due to limited number of visits with pt to return after injections to resume gait and balance training.  Pt agreeable to plan.   Rehab Potential Good   Clinical Impairments Affecting Rehab Potential cognition, severity of deficits   PT Frequency Other (comment)   PT Duration 8 weeks   PT Treatment/Interventions ADLs/Self Care Home Management;Electrical Stimulation;Cryotherapy;Neuromuscular re-education;Balance training;Therapeutic exercise;Therapeutic activities;Functional mobility training;Stair training;Gait training;DME Instruction;Patient/family education;Orthotic Fit/Training;Vestibular;Taping   PT Next Visit Plan will place pt on hold and then resume therapy in June after receiving Botox injections in LLE   Consulted and Agree with Plan of Care Patient      Patient will benefit from skilled therapeutic intervention in order to improve the following deficits and impairments:  Abnormal gait, Decreased activity tolerance, Decreased balance, Decreased cognition, Decreased mobility, Difficulty walking, Decreased strength, Impaired tone, Decreased knowledge of precautions, Decreased knowledge of use of DME  Visit Diagnosis: 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 Spastic hemiparesis of left dominant side ([place]) 05/13/2016  . 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

## 2016-07-04 NOTE — Therapy (Signed)
[facility] [address] [place], [place], [address] Phone: [phone]   Fax:  [phone]  Occupational Therapy Treatment  Patient Details  Name: Adam Perez MRN: [id number] Date of Birth: 01-28-1956 Referring Provider: Dr. Renato Gails  Encounter Date: 07/04/2016      OT End of Session - 07/04/16 1343    Visit Number 16   Number of Visits 30   Date for OT Re-Evaluation 08/13/16  date adjused due to missed therapy   Authorization Type [employer], 60 PT/OT ,, no auth required.    Authorization - Visit Number 16   Authorization - Number of Visits 30   OT Start Time 1101   OT Stop Time 1147   OT Time Calculation (min) 46 min   Activity Tolerance Patient tolerated treatment well      No past medical history on file.  No past surgical history on file.  Vitals:   07/04/16 1107  BP: (!) 143/91  Pulse: 77        Subjective Assessment - 07/04/16 1108    Subjective  I think my arm is doing better   Pertinent History see epic, care everywhere. Pt with R CVA, HTN,    Patient Stated Goals I want to walk normally and be able to hold a cup without dropping it   Currently in Pain? No/denies                      OT Treatments/Exercises (OP) - 07/04/16 0001      ADLs   ADL Comments Discussed with pt how he would like to use remaining OT visits (pt is limited to 30 OT visits per calendar year). After discussion, pt wishes to continue OT for 3  more weeks at 2x/wk and then be placed on hold to use remaining OT visits after next UE botox injection.  Plan put in writing for pt given pt's demonstrated ST memory deficits.  Pt verbalized understanding.      Neurological Re-education Exercises   Other Exercises 1 Pt with improving grasp and release in L hand - neuro re ed to address both grasp and release as well as 2 and 3 pt pinch hand functions with functional activity incorporating mid reach.  Pt continues to  need max cues for grading of tasks and to avoid over exertion which results in increased flexor tone. Pt needs min faciliation for reach and intermittent strategies to reduce tone during tasks  Pt issued table slides to do at home in addition to current HEP using L hand to hold cylindrical object to slide across table. PT able to return demonstate activity and verbalize understanding.                  OT Education - 07/04/16 1337    Education provided Yes   Education Details ugraded HEP for LUE incorporating functional task with table slides   Person(s) Educated Patient   Methods Explanation;Demonstration;Verbal cues;Tactile cues   Comprehension Verbalized understanding;Returned demonstration          OT Short Term Goals - 07/04/16 1338      OT SHORT TERM GOAL #1   Title Pt and family will be mod I with HEP - 04/22/2016   Status Achieved     OT SHORT TERM GOAL #2   Title Pt will demonstrate ability for PROM of shoulder fleixon to 130* in supine with no pain to aide in self care and as prep  for low functional reach.   Status Achieved  03/12/2016 150* in supine 0/10     OT SHORT TERM GOAL #3   Title Pt will be mod I with bathing   Status Achieved     OT SHORT TERM GOAL #4   Title Pt will be mod I with tub bench transfers   Status Achieved     OT SHORT TERM GOAL #5   Title Pt will be mod I with tying shoes using AE   Status Achieved     OT SHORT TERM GOAL #6   Title Pt will demonstrate abilty to use LUE as stabilizer 25% of the time during basic ADL's- 5/8//2018 (date adjusted as pt missed one week of therapy)   Status On-going     OT SHORT TERM GOAL #7   Title Pt will demonstrate ability for 30* of shoulder flexion for low reach with min facilitation with simple functional task   Status On-going           OT Long Term Goals - 07/04/16 1340      OT LONG TERM GOAL #1   Title Pt and family will be mod I with upgraded HEP- 05/20/2016   Status Achieved     OT  LONG TERM GOAL #2   Title Pt will be mod I with simple familiar hot meal prep   Status Achieved     OT LONG TERM GOAL #3   Title Pt will demonstrate ability to use LUE as stabilizer during basic ADL's 50% of the time. 08/13/2016 (date adjusted as pt missed one week of therapy)   Status On-going     OT LONG TERM GOAL #4   Title Pt will demonstrate ability to employ 2 strategies to reduce tone in LUE in order to improve ability to use LUE as stabilizer in basic ADL's   Status Achieved     OT LONG TERM GOAL #5   Title Pt will be mod I with upgraded HEP  - 6/5//2018 (dates adjusted as pt missed one week of therapy)   Status On-going     OT LONG TERM GOAL #6   Title Pt will demonstrate ability for grasp with LUE during basic self care activities with cues   Status On-going     OT LONG TERM GOAL #7   Title Pt will demonstrate ability for bilateral low reach to move light object with min compensations   Status On-going               Plan - 07/04/16 1341    Clinical Impression Statement Pt progressing toward goals.  BP remains elevated (see vital section) however within safe range to provide treatment today. Pt to see MD next week and will discuss.  Pt able to give several functional examples of how he is able to use LUE at home in tasks.    Rehab Potential Good   Clinical Impairments Affecting Rehab Potential decreased insight, severity of UE deficits   OT Frequency 2x / week   OT Duration 8 weeks   OT Treatment/Interventions Self-care/ADL training;Moist Heat;Electrical Stimulation;DME and/or AE instruction;Neuromuscular education;Therapeutic exercise;Functional Mobility Training;Manual Therapy;Passive range of motion;Splinting;Therapeutic activities;Balance training;Cognitive remediation/compensation   Plan CHECK BP!!, neuro re ed LUE, trunk, head. 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 Spastic hemiparesis of left dominant side (HCC) 05/13/2016  . 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 [place], [place], [address] Phone: [phone]   Fax:  [phone]  Name: Adam Perez MRN: [id number] Date of Birth: 1956/01/08

## 2016-07-04 NOTE — Patient Instructions (Signed)
Left tibial nerve block with phenol

## 2016-07-04 NOTE — Progress Notes (Signed)
Subjective:    Patient ID: Adam Perez, male    DOB: 12/08/1955, 61 y.o.   MRN: [id number] 05/23/2016 Botox injectin Number of units per muscle FCR 50 FCU 50 Biceps 150 Pectoralis 150 HPI PT indicates that patient has spasticity in the left lower extremity, although did not specify which muscles. The patient states that he has difficulty clearing his left foot and ankle. He does have a brace, but does not wear it. He also states that his left foot. Balance is a lot when he puts weight on it. He does note some stiffness in the left knee as well.  He is very pleased with results of his Botox injection. His hand is working much better. He feels like his strength is improved about 30%. He still notes some tightness in the left shoulder. However   Pain Inventory Average Pain 0 Pain Right Now 0 My pain is na  In the last 24 hours, has pain interfered with the following? General activity 0 Relation with others 0 Enjoyment of life 0 What TIME of day is your pain at its worst? na Sleep (in general) Good  Pain is worse with: some activites and na Pain improves with: na Relief from Meds: na  Mobility walk with assistance use a cane ability to climb steps?  yes do you drive?  yes  Function retired  Neuro/Psych No problems in this area  Prior Studies Any changes since last visit?  no  Physicians involved in your care Any changes since last visit?  no   No family history on file. Social History   Social History  . Marital status: Divorced    Spouse name: N/A  . Number of children: N/A  . Years of education: N/A   Social History Main Topics  . Smoking status: Never Smoker  . Smokeless tobacco: Never Used  . Alcohol use No  . Drug use: Unknown  . Sexual activity: Not on file   Other Topics Concern  . Not on file   Social History Narrative  . No narrative on file   No past surgical history on file. No past medical history on file. There were no vitals  taken for this visit.  Opioid Risk Score:   Fall Risk Score:  `1  Depression screen PHQ 2/9  Depression screen PHQ 2/9 05/13/2016  Decreased Interest 1  Down, Depressed, Hopeless 0  PHQ - 2 Score 1  Altered sleeping 2  Tired, decreased energy 1  Change in appetite 2  Feeling bad or failure about yourself  1  Trouble concentrating 1  Moving slowly or fidgety/restless 0  Suicidal thoughts 0  PHQ-9 Score 8  Difficult doing work/chores Extremely dIfficult    Review of Systems  All other systems reviewed and are negative.      Objective:   Physical Exam  Constitutional: He is oriented to person, place, and time. He appears well-developed and well-nourished.  HENT:  Head: Normocephalic and atraumatic.  Eyes: Conjunctivae and EOM are normal. Pupils are equal, round, and reactive to light.  Neck: Normal range of motion.  Neurological: He is alert and oriented to person, place, and time.  Psychiatric: He has a normal mood and affect.  Nursing note and vitals reviewed.  Tone MAS 1 in the left biceps, MAS 0 in the finger and wrist flexors, MAS 2-3 in the left pectoralis. There is some pain in left shoulder with abduction and external rotation.  Left quad MAS 3 Clonus left ankle,  sustained     Assessment & Plan:  1. Left spastic hemiplegia secondary to right CVA. Improvement in left upper extremity tone with Botox. He does have tone in the left lower limb that is interfering with activity For the left ankle, would recommend tibial nerve block with phenol. This should help the clonus  4. Left quad recommend Botox 100 units, 25 into each head of the quadricep. We will have to reduce upper extremity dosage to maintain maximum of 400 units, therefore,  FCR 50 FDS, 50 Pectoralis 100 Biceps 100

## 2016-07-08 ENCOUNTER — Encounter: Payer: Self-pay | Admitting: Occupational Therapy

## 2016-07-08 ENCOUNTER — Ambulatory Visit: Payer: Managed Care, Other (non HMO) | Admitting: Occupational Therapy

## 2016-07-08 ENCOUNTER — Ambulatory Visit: Payer: Self-pay | Admitting: Physical Therapy

## 2016-07-08 VITALS — BP 145/98

## 2016-07-08 DIAGNOSIS — I69315 Cognitive social or emotional deficit following cerebral infarction: Secondary | ICD-10-CM

## 2016-07-08 DIAGNOSIS — R2681 Unsteadiness on feet: Secondary | ICD-10-CM

## 2016-07-08 DIAGNOSIS — R293 Abnormal posture: Secondary | ICD-10-CM

## 2016-07-08 DIAGNOSIS — I69952 Hemiplegia and hemiparesis following unspecified cerebrovascular disease affecting left dominant side: Secondary | ICD-10-CM

## 2016-07-08 DIAGNOSIS — M25622 Stiffness of left elbow, not elsewhere classified: Secondary | ICD-10-CM

## 2016-07-08 DIAGNOSIS — M25612 Stiffness of left shoulder, not elsewhere classified: Secondary | ICD-10-CM

## 2016-07-08 DIAGNOSIS — M6281 Muscle weakness (generalized): Secondary | ICD-10-CM

## 2016-07-08 NOTE — Therapy (Signed)
[facility] [address] [place], [place], [address] Phone: [phone]   Fax:  [phone]  Occupational Therapy Treatment  Patient Details  Name: Adam Perez MRN: [id number] Date of Birth: 08-27-1955 Referring Provider: Dr. Renato Gails  Encounter Date: 07/08/2016      OT End of Session - 07/08/16 1129    Visit Number 17   Number of Visits 30   Date for OT Re-Evaluation 08/13/16   Authorization Type [employer], 60 PT/OT ,, no auth required.    Authorization - Visit Number 17   Authorization - Number of Visits 30   OT Start Time 0931   OT Stop Time 1020   OT Time Calculation (min) 49 min   Activity Tolerance Patient tolerated treatment well      History reviewed. No pertinent past medical history.  History reviewed. No pertinent surgical history.  Vitals:   07/08/16 0932  BP: (!) 145/98        Subjective Assessment - 07/08/16 0944    Subjective  I don't wear the brace on my foot because it makes the shaking worse.     Pertinent History see epic, care everywhere. Pt with R CVA, HTN,    Patient Stated Goals I want to walk normally and be able to hold a cup without dropping it   Currently in Pain? No/denies                      OT Treatments/Exercises (OP) - 07/08/16 0001      Neurological Re-education Exercises   Other Exercises 1 Neuro re ed following manual therapy to activate into alignment and length by working on both bilateral low reach as well as unilateral reach. Pt with better performance for unilateral reach as he overuses RUE with bilateral tasks.  Pt able to achieve approximately 50* of shoulder flexion for low to beginning mid reach with simple grasp and release functional task with min compensations. Pt requires mod facilitation for reach greater than 50* of shoulder flexion. Pt continues to report he is increasingly using LUE in functional tasks at home.  Long discussion on  importance of wearing his AFO both for fall prevention as well as aliigment and to discourage maladaptive movement patterns that also increase tone in LUE and LLE.  Pt verbalized understanding and reports he will start wearing brace.      Manual Therapy   Manual Therapy Joint mobilization;Soft tissue mobilization;Scapular mobilization   Manual therapy comments Joint, soft tissue and scap mob to address alignment in L shoulder girdle as well as elbow extension and ER, all in supine. Pt with increased tigthness since not wearing brace and walking with increased aymmetry. Able to improve alignment and gain sholder flexion to approximately 110*, elbow extension to -20*, some improvement in ER.                    OT Short Term Goals - 07/08/16 1124      OT SHORT TERM GOAL #1   Title Pt and family will be mod I with HEP - 04/22/2016   Status Achieved     OT SHORT TERM GOAL #2   Title Pt will demonstrate ability for PROM of shoulder fleixon to 130* in supine with no pain to aide in self care and as prep for low functional reach.   Status Achieved  03/12/2016 150* in supine 0/10     OT SHORT TERM GOAL #3  Title Pt will be mod I with bathing   Status Achieved     OT SHORT TERM GOAL #4   Title Pt will be mod I with tub bench transfers   Status Achieved     OT SHORT TERM GOAL #5   Title Pt will be mod I with tying shoes using AE   Status Achieved     OT SHORT TERM GOAL #6   Title Pt will demonstrate abilty to use LUE as stabilizer 25% of the time during basic ADL's- 5/8//2018 (date adjusted as pt missed one week of therapy)   Status Achieved     OT SHORT TERM GOAL #7   Title Pt will demonstrate ability for 30* of shoulder flexion for low reach with min facilitation with simple functional task   Status Achieved  4.30/2018 50*           OT Long Term Goals - 07/08/16 1125      OT LONG TERM GOAL #1   Title Pt and family will be mod I with upgraded HEP- 05/20/2016   Status  Achieved     OT LONG TERM GOAL #2   Title Pt will be mod I with simple familiar hot meal prep   Status Achieved     OT LONG TERM GOAL #3   Title Pt will demonstrate ability to use LUE as stabilizer during basic ADL's 50% of the time. 08/13/2016 (date adjusted as pt missed one week of therapy)   Status On-going     OT LONG TERM GOAL #4   Title Pt will demonstrate ability to employ 2 strategies to reduce tone in LUE in order to improve ability to use LUE as stabilizer in basic ADL's   Status Achieved     OT LONG TERM GOAL #5   Title Pt will be mod I with upgraded HEP  - 6/5//2018 (dates adjusted as pt missed one week of therapy)   Status On-going     OT LONG TERM GOAL #6   Title Pt will demonstrate ability for grasp with LUE during basic self care activities with cues   Status On-going     OT LONG TERM GOAL #7   Title Pt will demonstrate ability for bilateral low reach to move light object with min compensations   Status On-going               Plan - 07/08/16 1125    Clinical Impression Statement Pt progressing toward goals and continues to report that he is using his LUE more and more at home.    Rehab Potential Good   Clinical Impairments Affecting Rehab Potential decreased insight, severity of UE deficits   OT Frequency 2x / week   OT Duration 8 weeks   OT Treatment/Interventions Self-care/ADL training;Moist Heat;Electrical Stimulation;DME and/or AE instruction;Neuromuscular education;Therapeutic exercise;Functional Mobility Training;Manual Therapy;Passive range of motion;Splinting;Therapeutic activities;Balance training;Cognitive remediation/compensation   Plan CHECK BP!!!,neuro re ed LUE, trunk, head, postrual control, balance and functional mobility   Consulted and Agree with Plan of Care Patient      Patient will benefit from skilled therapeutic intervention in order to improve the following deficits and impairments:  Abnormal gait, Decreased activity tolerance,  Decreased balance, Decreased cognition, Decreased knowledge of use of DME, Decreased mobility, Decreased range of motion, Decreased safety awareness, Difficulty walking, Decreased strength, Impaired UE functional use, Impaired tone  Visit Diagnosis: 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 Spastic hemiparesis of left dominant side (HCC) 05/13/2016  . 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

## 2016-07-11 ENCOUNTER — Ambulatory Visit: Payer: Self-pay | Admitting: Physical Therapy

## 2016-07-11 ENCOUNTER — Encounter: Payer: Self-pay | Admitting: Occupational Therapy

## 2016-07-11 ENCOUNTER — Ambulatory Visit: Payer: Self-pay | Attending: Physical Medicine & Rehabilitation | Admitting: Occupational Therapy

## 2016-07-11 DIAGNOSIS — M6281 Muscle weakness (generalized): Secondary | ICD-10-CM | POA: Insufficient documentation

## 2016-07-11 DIAGNOSIS — M25612 Stiffness of left shoulder, not elsewhere classified: Secondary | ICD-10-CM | POA: Insufficient documentation

## 2016-07-11 DIAGNOSIS — M25622 Stiffness of left elbow, not elsewhere classified: Secondary | ICD-10-CM | POA: Insufficient documentation

## 2016-07-11 DIAGNOSIS — I69952 Hemiplegia and hemiparesis following unspecified cerebrovascular disease affecting left dominant side: Secondary | ICD-10-CM | POA: Insufficient documentation

## 2016-07-11 DIAGNOSIS — R293 Abnormal posture: Secondary | ICD-10-CM | POA: Insufficient documentation

## 2016-07-11 DIAGNOSIS — R2681 Unsteadiness on feet: Secondary | ICD-10-CM | POA: Insufficient documentation

## 2016-07-11 DIAGNOSIS — I69315 Cognitive social or emotional deficit following cerebral infarction: Secondary | ICD-10-CM | POA: Insufficient documentation

## 2016-07-11 NOTE — Therapy (Signed)
[facility] [address] [place], [place], [address] Phone: [phone]   Fax:  [phone]  Occupational Therapy Treatment  Patient Details  Name: Adam AnnaDonald L Mcdougall MRN: [id number] Date of Birth: August 15, 1955 Referring Provider: Dr. Renato GailsJohn Aguilar  Encounter Date: 07/11/2016      OT End of Session - 07/11/16 1008    Visit Number 18   Number of Visits 30   Date for OT Re-Evaluation 08/13/16   Authorization Type [employer], 60 PT/OT ,, no auth required.    Authorization - Visit Number 18   Authorization - Number of Visits 30   OT Start Time 0801   OT Stop Time 0846   OT Time Calculation (min) 45 min   Activity Tolerance Patient tolerated treatment well      History reviewed. No pertinent past medical history.  History reviewed. No pertinent surgical history.  There were no vitals filed for this visit.      Subjective Assessment - 07/11/16 0805    Subjective  I am wearing my brace today   Pertinent History see epic, care everywhere. Pt with R CVA, HTN,    Patient Stated Goals I want to walk normally and be able to hold a cup without dropping it   Currently in Pain? No/denies                      OT Treatments/Exercises (OP) - 07/11/16 0001      Neurological Re-education Exercises   Other Exercises 1 Neuro re ed to address low reach with emphasis on active elbow extension, neutral shouder rotation (i.e. discouraging IR with reach), incorporation of grasp into task all in closed chain activities with low unilateral reach.  Activities also focused on grading through hand with emphasis on normal effort, timing and normal point of intitiation for movement as well as direction of movement.  By end of session pt able to complete low unilateral open chain reach activity with grasp and release incorporated with vc's only (pt with min compensations for elbow flexion due to tone).                    OT  Short Term Goals - 07/11/16 1006      OT SHORT TERM GOAL #1   Title Pt and family will be mod I with HEP - 04/22/2016   Status Achieved     OT SHORT TERM GOAL #2   Title Pt will demonstrate ability for PROM of shoulder fleixon to 130* in supine with no pain to aide in self care and as prep for low functional reach.   Status Achieved  03/12/2016 150* in supine 0/10     OT SHORT TERM GOAL #3   Title Pt will be mod I with bathing   Status Achieved     OT SHORT TERM GOAL #4   Title Pt will be mod I with tub bench transfers   Status Achieved     OT SHORT TERM GOAL #5   Title Pt will be mod I with tying shoes using AE   Status Achieved     OT SHORT TERM GOAL #6   Title Pt will demonstrate abilty to use LUE as stabilizer 25% of the time during basic ADL's- 5/8//2018 (date adjusted as pt missed one week of therapy)   Status Achieved     OT SHORT TERM GOAL #7   Title Pt will demonstrate ability for 30* of shoulder flexion  for low reach with min facilitation with simple functional task   Status Achieved  4.30/2018 50*           OT Long Term Goals - 07/11/16 1006      OT LONG TERM GOAL #1   Title Pt and family will be mod I with upgraded HEP- 05/20/2016   Status Achieved     OT LONG TERM GOAL #2   Title Pt will be mod I with simple familiar hot meal prep   Status Achieved     OT LONG TERM GOAL #3   Title Pt will demonstrate ability to use LUE as stabilizer during basic ADL's 50% of the time. 08/13/2016 (date adjusted as pt missed one week of therapy)   Status On-going     OT LONG TERM GOAL #4   Title Pt will demonstrate ability to employ 2 strategies to reduce tone in LUE in order to improve ability to use LUE as stabilizer in basic ADL's   Status Achieved     OT LONG TERM GOAL #5   Title Pt will be mod I with upgraded HEP  - 6/5//2018 (dates adjusted as pt missed one week of therapy)   Status On-going     OT LONG TERM GOAL #6   Title Pt will demonstrate ability for grasp  with LUE during basic self care activities with cues   Status On-going     OT LONG TERM GOAL #7   Title Pt will demonstrate ability for bilateral low reach to move light object with min compensations   Status On-going               Plan - 07/11/16 1007    Clinical Impression Statement Pt progressing toward goals. Pt able to complete low reach activity today with min compensations after significant prep work prior to activity.    Rehab Potential Good   Clinical Impairments Affecting Rehab Potential decreased insight, severity of UE deficits   OT Frequency 2x / week   OT Duration 8 weeks   OT Treatment/Interventions Self-care/ADL training;Moist Heat;Electrical Stimulation;DME and/or AE instruction;Neuromuscular education;Therapeutic exercise;Functional Mobility Training;Manual Therapy;Passive range of motion;Splinting;Therapeutic activities;Balance training;Cognitive remediation/compensation   Plan CHECK BP!!, neuro re ed LUE, trunk, head, postural contorl, balance and functional mobility   Consulted and Agree with Plan of Care Patient      Patient will benefit from skilled therapeutic intervention in order to improve the following deficits and impairments:  Abnormal gait, Decreased activity tolerance, Decreased balance, Decreased cognition, Decreased knowledge of use of DME, Decreased mobility, Decreased range of motion, Decreased safety awareness, Difficulty walking, Decreased strength, Impaired UE functional use, Impaired tone  Visit Diagnosis: 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 Spastic hemiparesis of left dominant side (HCC) 05/13/2016  . 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

## 2016-07-16 ENCOUNTER — Ambulatory Visit: Payer: Self-pay | Admitting: Occupational Therapy

## 2016-07-16 ENCOUNTER — Ambulatory Visit: Payer: Self-pay | Admitting: Physical Therapy

## 2016-07-16 DIAGNOSIS — I69952 Hemiplegia and hemiparesis following unspecified cerebrovascular disease affecting left dominant side: Secondary | ICD-10-CM

## 2016-07-16 DIAGNOSIS — M25612 Stiffness of left shoulder, not elsewhere classified: Secondary | ICD-10-CM

## 2016-07-16 DIAGNOSIS — M25622 Stiffness of left elbow, not elsewhere classified: Secondary | ICD-10-CM

## 2016-07-16 DIAGNOSIS — I69315 Cognitive social or emotional deficit following cerebral infarction: Secondary | ICD-10-CM

## 2016-07-16 DIAGNOSIS — M6281 Muscle weakness (generalized): Secondary | ICD-10-CM

## 2016-07-16 NOTE — Therapy (Signed)
[facility] [address] [place], [place], [address] Phone: [phone]   Fax:  [phone]  Occupational Therapy Treatment  Patient Details  Name: Adam Perez MRN: [id number] Date of Birth: Jul 14, 1955 Referring Provider: Dr. Renato Gails  Encounter Date: 07/16/2016      OT End of Session - 07/16/16 1314    Visit Number 19   Number of Visits 30   Date for OT Re-Evaluation 08/13/16   Authorization Type [employer], 60 PT/OT ,, no auth required.    Authorization - Visit Number 19   Authorization - Number of Visits 30   OT Start Time 0935   OT Stop Time 1015   OT Time Calculation (min) 40 min      No past medical history on file.  No past surgical history on file.  There were no vitals filed for this visit.      Subjective Assessment - 07/16/16 0949    Subjective  Pt reports his arm is doing much better   Pertinent History see epic, care everywhere. Pt with R CVA, HTN,    Patient Stated Goals I want to walk normally and be able to hold a cup without dropping it   Currently in Pain? No/denies         Treatment: Supine, gentle passive ROM/ stretching to LUE followed by A/ROM chcst press with medium ball, mod facilitation. Seated weightbearing through left elbow over block with body on arm movements, followed by gentle weightbearing through left hand with fingers over edge of mat lateral weight shifts and trunk rotation, min-mod facilitation/ v.c. Standing to face mat rolling ball along surface with bilateral UE's for supported shoulder flexion and elbow extension with light weightbearing, min facilitation/ v.c. UE ranger for low range shoulder flexion with elbow extension, min-mod facilitation to avoid compensation. Low range functional reaching with LUE, open chain to place checkers in connect four frame, min v.c.                       OT Short Term Goals - 07/11/16 1006      OT SHORT TERM  GOAL #1   Title Pt and family will be mod I with HEP - 04/22/2016   Status Achieved     OT SHORT TERM GOAL #2   Title Pt will demonstrate ability for PROM of shoulder fleixon to 130* in supine with no pain to aide in self care and as prep for low functional reach.   Status Achieved  03/12/2016 150* in supine 0/10     OT SHORT TERM GOAL #3   Title Pt will be mod I with bathing   Status Achieved     OT SHORT TERM GOAL #4   Title Pt will be mod I with tub bench transfers   Status Achieved     OT SHORT TERM GOAL #5   Title Pt will be mod I with tying shoes using AE   Status Achieved     OT SHORT TERM GOAL #6   Title Pt will demonstrate abilty to use LUE as stabilizer 25% of the time during basic ADL's- 5/8//2018 (date adjusted as pt missed one week of therapy)   Status Achieved     OT SHORT TERM GOAL #7   Title Pt will demonstrate ability for 30* of shoulder flexion for low reach with min facilitation with simple functional task   Status Achieved  4.30/2018 50*  OT Long Term Goals - 07/11/16 1006      OT LONG TERM GOAL #1   Title Pt and family will be mod I with upgraded HEP- 05/20/2016   Status Achieved     OT LONG TERM GOAL #2   Title Pt will be mod I with simple familiar hot meal prep   Status Achieved     OT LONG TERM GOAL #3   Title Pt will demonstrate ability to use LUE as stabilizer during basic ADL's 50% of the time. 08/13/2016 (date adjusted as pt missed one week of therapy)   Status On-going     OT LONG TERM GOAL #4   Title Pt will demonstrate ability to employ 2 strategies to reduce tone in LUE in order to improve ability to use LUE as stabilizer in basic ADL's   Status Achieved     OT LONG TERM GOAL #5   Title Pt will be mod I with upgraded HEP  - 6/5//2018 (dates adjusted as pt missed one week of therapy)   Status On-going     OT LONG TERM GOAL #6   Title Pt will demonstrate ability for grasp with LUE during basic self care activities with cues    Status On-going     OT LONG TERM GOAL #7   Title Pt will demonstrate ability for bilateral low reach to move light object with min compensations   Status On-going               Plan - 07/16/16 1312    Clinical Impression Statement pt is progressing towards goals. Following gentle stretching and weightbearing pt demonstrates improved functional use of lUE.   Rehab Potential Good   Clinical Impairments Affecting Rehab Potential decreased insight, severity of UE deficits   OT Frequency 2x / week   OT Duration 8 weeks   OT Treatment/Interventions Self-care/ADL training;Moist Heat;Electrical Stimulation;DME and/or AE instruction;Neuromuscular education;Therapeutic exercise;Functional Mobility Training;Manual Therapy;Passive range of motion;Splinting;Therapeutic activities;Balance training;Cognitive remediation/compensation   Plan neuro re-ed to address trunk / LUE   Consulted and Agree with Plan of Care Patient      Patient will benefit from skilled therapeutic intervention in order to improve the following deficits and impairments:  Abnormal gait, Decreased activity tolerance, Decreased balance, Decreased cognition, Decreased knowledge of use of DME, Decreased mobility, Decreased range of motion, Decreased safety awareness, Difficulty walking, Decreased strength, Impaired UE functional use, Impaired tone  Visit Diagnosis: Spastic 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 Spastic hemiparesis of left dominant side (HCC) 05/13/2016  . 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

## 2016-07-18 ENCOUNTER — Encounter: Payer: Self-pay | Admitting: Occupational Therapy

## 2016-07-18 ENCOUNTER — Ambulatory Visit: Payer: Self-pay | Admitting: Physical Therapy

## 2016-07-18 ENCOUNTER — Ambulatory Visit: Payer: Self-pay | Admitting: Physical Medicine & Rehabilitation

## 2016-07-18 ENCOUNTER — Ambulatory Visit: Payer: Self-pay | Admitting: Occupational Therapy

## 2016-07-18 VITALS — BP 150/84

## 2016-07-18 DIAGNOSIS — M6281 Muscle weakness (generalized): Secondary | ICD-10-CM

## 2016-07-18 DIAGNOSIS — M25612 Stiffness of left shoulder, not elsewhere classified: Secondary | ICD-10-CM

## 2016-07-18 DIAGNOSIS — M25622 Stiffness of left elbow, not elsewhere classified: Secondary | ICD-10-CM

## 2016-07-18 DIAGNOSIS — I69952 Hemiplegia and hemiparesis following unspecified cerebrovascular disease affecting left dominant side: Secondary | ICD-10-CM

## 2016-07-18 DIAGNOSIS — I69315 Cognitive social or emotional deficit following cerebral infarction: Secondary | ICD-10-CM

## 2016-07-18 NOTE — Therapy (Signed)
[facility] [address] [place], [place], [address] Phone: [phone]   Fax:  [phone]  Occupational Therapy Treatment  Patient Details  Name: Adam Perez MRN: [id number] Date of Birth: 1955/07/01 Referring Provider: Dr. Renato Gails  Encounter Date: 07/18/2016      OT End of Session - 07/18/16 1040    Visit Number 20   Number of Visits 30   Date for OT Re-Evaluation 08/13/16   Authorization Type [employer], 60 PT/OT ,, no auth required.    Authorization - Visit Number 20   Authorization - Number of Visits 30   OT Start Time 0932   OT Stop Time 1016   OT Time Calculation (min) 44 min   Activity Tolerance Patient tolerated treatment well      History reviewed. No pertinent past medical history.  History reviewed. No pertinent surgical history.  Vitals:   07/18/16 0939  BP: (!) 150/84        Subjective Assessment - 07/18/16 0942    Subjective  I don't really feel any different with my BP being lower   Pertinent History see epic, care everywhere. Pt with R CVA, HTN,    Patient Stated Goals I want to walk normally and be able to hold a cup without dropping it   Currently in Pain? No/denies                      OT Treatments/Exercises (OP) - 07/18/16 0001      Neurological Re-education Exercises   Other Exercises 1 Pt arrived again today without AFO for LLE.  Again discussed importance if wearing AFO every day all day in order to reduce chance of falls as well as to improve alignment, reduce tone and reduce abnormal movement patterns. Pt again verbalized understanding however is still not consistently wearing AFO. Will continue to reinforce.  Neuro re ed to address elbow extension and then mid to overhead reach in supine in closed chain activity. Reinforced grading of activity, speed, timing and direction of movement as well as where pt intiates movement (allowing hand to lead reach vs hiking  shoulder).  Also addressed placing and hold in closec chain activity.  Transitioned into sitting and addressed low to mid reach in closed chain activity - pt needs max cues and mod faciltation for grading, tone influence and abnormal posturing and movement patterns.  Pt with improving ability to recognize thess issues. Grip sterngth today with L hand was 10 pounds.                   OT Short Term Goals - 07/18/16 1038      OT SHORT TERM GOAL #1   Title Pt and family will be mod I with HEP - 04/22/2016   Status Achieved     OT SHORT TERM GOAL #2   Title Pt will demonstrate ability for PROM of shoulder fleixon to 130* in supine with no pain to aide in self care and as prep for low functional reach.   Status Achieved  03/12/2016 150* in supine 0/10     OT SHORT TERM GOAL #3   Title Pt will be mod I with bathing   Status Achieved     OT SHORT TERM GOAL #4   Title Pt will be mod I with tub bench transfers   Status Achieved     OT SHORT TERM GOAL #5   Title Pt will be mod I with  tying shoes using AE   Status Achieved     OT SHORT TERM GOAL #6   Title Pt will demonstrate abilty to use LUE as stabilizer 25% of the time during basic ADL's- 5/8//2018 (date adjusted as pt missed one week of therapy)   Status Achieved     OT SHORT TERM GOAL #7   Title Pt will demonstrate ability for 30* of shoulder flexion for low reach with min facilitation with simple functional task   Status Achieved  4.30/2018 50*           OT Long Term Goals - 07/18/16 1038      OT LONG TERM GOAL #1   Title Pt and family will be mod I with upgraded HEP- 05/20/2016   Status Achieved     OT LONG TERM GOAL #2   Title Pt will be mod I with simple familiar hot meal prep   Status Achieved     OT LONG TERM GOAL #3   Title Pt will demonstrate ability to use LUE as stabilizer during basic ADL's 50% of the time. 08/13/2016 (date adjusted as pt missed one week of therapy)   Status Achieved     OT LONG TERM  GOAL #4   Title Pt will demonstrate ability to employ 2 strategies to reduce tone in LUE in order to improve ability to use LUE as stabilizer in basic ADL's   Status Achieved     OT LONG TERM GOAL #5   Title Pt will be mod I with upgraded HEP  - 6/5//2018 (dates adjusted as pt missed one week of therapy)   Status On-going     OT LONG TERM GOAL #6   Title Pt will demonstrate ability for grasp with LUE during basic self care activities with cues   Status Achieved     OT LONG TERM GOAL #7   Title Pt will demonstrate ability for bilateral low reach to move light object with min compensations   Status On-going               Plan - 07/18/16 1038    Clinical Impression Statement Pt making good progress toward goals especially last three sessions.  Pt continues to report increasing ability to use LUE at home.    Rehab Potential Good   Clinical Impairments Affecting Rehab Potential decreased insight, severity of UE deficits   OT Frequency 2x / week   OT Duration 8 weeks   OT Treatment/Interventions Self-care/ADL training;Moist Heat;Electrical Stimulation;DME and/or AE instruction;Neuromuscular education;Therapeutic exercise;Functional Mobility Training;Manual Therapy;Passive range of motion;Splinting;Therapeutic activities;Balance training;Cognitive remediation/compensation   Plan NMR for trunk, LUE, balance, functional mobility, postural alignment.    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 Spastic hemiparesis of left dominant side (HCC) 05/13/2016  . 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

## 2016-07-22 ENCOUNTER — Ambulatory Visit: Payer: Self-pay | Admitting: Occupational Therapy

## 2016-07-22 ENCOUNTER — Ambulatory Visit: Payer: Self-pay | Admitting: Physical Therapy

## 2016-07-22 DIAGNOSIS — I69952 Hemiplegia and hemiparesis following unspecified cerebrovascular disease affecting left dominant side: Secondary | ICD-10-CM

## 2016-07-22 NOTE — Therapy (Signed)
[facility] [address] [place], [place], [address] Phone: [phone]   Fax:  [phone]  Occupational Therapy Treatment  Patient Details  Name: Adam Perez MRN: [id number] Date of Birth: [birth year] Referring Provider: Dr. Renato Gails  Encounter Date: 07/22/2016    No past medical history on file.  No past surgical history on file.  There were no vitals filed for this visit.                              OT Short Term Goals - 07/18/16 1038      OT SHORT TERM GOAL #1   Title Pt and family will be mod I with HEP - 04/22/2016   Status Achieved     OT SHORT TERM GOAL #2   Title Pt will demonstrate ability for PROM of shoulder fleixon to 130* in supine with no pain to aide in self care and as prep for low functional reach.   Status Achieved  03/12/2016 150* in supine 0/10     OT SHORT TERM GOAL #3   Title Pt will be mod I with bathing   Status Achieved     OT SHORT TERM GOAL #4   Title Pt will be mod I with tub bench transfers   Status Achieved     OT SHORT TERM GOAL #5   Title Pt will be mod I with tying shoes using AE   Status Achieved     OT SHORT TERM GOAL #6   Title Pt will demonstrate abilty to use LUE as stabilizer 25% of the time during basic ADL's- 5/8//2018 (date adjusted as pt missed one week of therapy)   Status Achieved     OT SHORT TERM GOAL #7   Title Pt will demonstrate ability for 30* of shoulder flexion for low reach with min facilitation with simple functional task   Status Achieved  4.30/2018 50*           OT Long Term Goals - 07/18/16 1038      OT LONG TERM GOAL #1   Title Pt and family will be mod I with upgraded HEP- 05/20/2016   Status Achieved     OT LONG TERM GOAL #2   Title Pt will be mod I with simple familiar hot meal prep   Status Achieved     OT LONG TERM GOAL #3   Title Pt will demonstrate ability to use LUE as stabilizer during basic  ADL's 50% of the time. 08/13/2016 (date adjusted as pt missed one week of therapy)   Status Achieved     OT LONG TERM GOAL #4   Title Pt will demonstrate ability to employ 2 strategies to reduce tone in LUE in order to improve ability to use LUE as stabilizer in basic ADL's   Status Achieved     OT LONG TERM GOAL #5   Title Pt will be mod I with upgraded HEP  - 6/5//2018 (dates adjusted as pt missed one week of therapy)   Status On-going     OT LONG TERM GOAL #6   Title Pt will demonstrate ability for grasp with LUE during basic self care activities with cues   Status Achieved     OT LONG TERM GOAL #7   Title Pt will demonstrate ability for bilateral low reach to move light object with min compensations   Status On-going  Pt is currently receiving outpatient PT and OT (on hold for PT awaiting botox injection for significant tone in LLE).  Pt has made good progress in basic mobility and has progressed from wheelcair level to short distances with ambulation with quad cane and L AFO.  Pt is only able to ambulate approximately 120 feet on even surfaces before demonstrating shortness of breath as well as decreased control of dynamic standing balance. See PT note for standardized tests related to balance and fall risks.  Pt has had some functional return in his UE and is now able to use this as a stabilizer and in basic self care tasks as a gross assist.  Pt has improved hand function however proximal return has been much slower and pt has great difficulty with functional reach limiting his ability to use his UE in bilateral and unilateral tasks.  Due to balance deficits, risk for falling (normal TUG time less than 13.5 for fall risk and pt's current score is 16.69;  Normal gait speed is greater than 1.8 and pt's current speed is 1.39 - both of these standardized tests place pt at significant risk for falling), significantly impaired functional use of his LUE, spasticity in LLE and LUE, decreased  trunk control, poor activity tolerance and ongoing medical issues (primarily difficulty controlling BP at this time despite medications), I believe that this patient would have great difficulty returning to work at this point in time. See MD notes and last PT and OT notes for details on what pt is currently addressing in therapy.       Patient will benefit from skilled therapeutic intervention in order to improve the following deficits and impairments:     Visit Diagnosis: Spastic hemiplegia of left dominant side as late effect of cerebrovascular disease, unspecified cerebrovascular disease type [facility](HCC)    Problem List Patient Active Problem List   Diagnosis Date Noted  . Spastic hemiparesis of left dominant side (HCC) 05/13/2016  . 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

## 2016-07-25 ENCOUNTER — Ambulatory Visit: Payer: Self-pay | Admitting: Physical Therapy

## 2016-07-25 ENCOUNTER — Encounter: Payer: Self-pay | Admitting: Occupational Therapy

## 2016-07-29 ENCOUNTER — Encounter: Payer: Self-pay | Admitting: Occupational Therapy

## 2016-07-29 ENCOUNTER — Ambulatory Visit: Payer: Self-pay | Admitting: Physical Therapy

## 2016-08-01 ENCOUNTER — Ambulatory Visit: Payer: Self-pay | Admitting: Physical Therapy

## 2016-08-01 ENCOUNTER — Encounter: Payer: Self-pay | Admitting: Occupational Therapy

## 2016-08-16 ENCOUNTER — Ambulatory Visit: Payer: Self-pay | Admitting: Physical Medicine & Rehabilitation

## 2016-08-20 ENCOUNTER — Ambulatory Visit: Payer: Self-pay | Admitting: Occupational Therapy

## 2016-08-23 ENCOUNTER — Encounter: Payer: Self-pay | Admitting: Occupational Therapy

## 2016-09-03 ENCOUNTER — Encounter: Payer: Self-pay | Admitting: Occupational Therapy

## 2016-09-03 DIAGNOSIS — I69952 Hemiplegia and hemiparesis following unspecified cerebrovascular disease affecting left dominant side: Secondary | ICD-10-CM

## 2016-09-03 NOTE — Therapy (Signed)
[place] [address] [place], [place], [address] Phone: [phone]   Fax:  [phone]  Occupational Therapy Treatment  Patient Details  Name: Adam Perez MRN: [id number] Date of Birth: 21-Dec-1955 Referring Provider: Dr. Levada Schilling  Encounter Date: 09/03/2016    No past medical history on file.  No past surgical history on file.  There were no vitals filed for this visit.                              OT Short Term Goals - 09/03/16 1415      OT SHORT TERM GOAL #1   Title Pt and family will be mod I with HEP - 04/22/2016   Status Achieved     OT SHORT TERM GOAL #2   Title Pt will demonstrate ability for PROM of shoulder fleixon to 130* in supine with no pain to aide in self care and as prep for low functional reach.   Status Achieved  03/12/2016 150* in supine 0/10     OT SHORT TERM GOAL #3   Title Pt will be mod I with bathing   Status Achieved     OT SHORT TERM GOAL #4   Title Pt will be mod I with tub bench transfers   Status Achieved     OT SHORT TERM GOAL #5   Title Pt will be mod I with tying shoes using AE   Status Achieved     OT SHORT TERM GOAL #6   Title Pt will demonstrate abilty to use LUE as stabilizer 25% of the time during basic ADL's- 5/8//2018 (date adjusted as pt missed one week of therapy)   Status Achieved     OT SHORT TERM GOAL #7   Title Pt will demonstrate ability for 30* of shoulder flexion for low reach with min facilitation with simple functional task   Status Achieved  4.30/2018 50*           OT Long Term Goals - 09/03/16 1415      OT LONG TERM GOAL #1   Title Pt and family will be mod I with upgraded HEP- 05/20/2016   Status Achieved     OT LONG TERM GOAL #2   Title Pt will be mod I with simple familiar hot meal prep   Status Achieved     OT LONG TERM GOAL #3   Title Pt will demonstrate ability to use LUE as stabilizer during basic  ADL's 50% of the time. 08/13/2016 (date adjusted as pt missed one week of therapy)   Status Achieved     OT LONG TERM GOAL #4   Title Pt will demonstrate ability to employ 2 strategies to reduce tone in LUE in order to improve ability to use LUE as stabilizer in basic ADL's   Status Achieved     OT LONG TERM GOAL #5   Title Pt will be mod I with upgraded HEP  - 6/5//2018 (dates adjusted as pt missed one week of therapy)   Status Unable to assess     OT LONG TERM GOAL #6   Title Pt will demonstrate ability for grasp with LUE during basic self care activities with cues   Status Achieved     OT LONG TERM GOAL #7   Title Pt will demonstrate ability for bilateral low reach to move light object with min compensations   Status Unable to assess  Plan - 09/03/16 1416    Clinical Impression Statement Pt did not return to therapy since last visit on 07/22/2016.  Pt stated at the last session that he needed to get disability and insurance worked out. Will d/c from OT at this time.       Patient will benefit from skilled therapeutic intervention in order to improve the following deficits and impairments:     Visit Diagnosis: Spastic hemiplegia of left dominant side as late effect of cerebrovascular disease, unspecified cerebrovascular disease type [facility])    Problem List Patient Active Problem List   Diagnosis Date Noted  . Spastic hemiparesis of left dominant side ([place]) 05/13/2016  . 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  Patient goals were partially met. Patient is being discharged due to not returning since the last  visit.  ?????      Quay Burow, OTR/L 09/03/2016, 2:17 PM  [place] [address] [place], [place], [address] Phone: [phone]   Fax:  [phone]  Name: Adam Perez MRN: [id number] Date of Birth: 1956-03-01

## 2016-09-27 ENCOUNTER — Ambulatory Visit: Payer: Self-pay | Admitting: Physical Medicine & Rehabilitation

## 2016-10-03 ENCOUNTER — Encounter: Payer: Self-pay | Admitting: Physical Therapy

## 2016-10-03 NOTE — Therapy (Signed)
[facility] [address] [place], [place], [address] Phone: [phone]   Fax:  [phone]  Patient Details  Name: Adam Perez MRN: [id number] Date of Birth: [birth year] Referring Provider:  No ref. provider found  Encounter Date: 10/03/2016  PHYSICAL THERAPY DISCHARGE SUMMARY  Visits from Start of Care: 22  Current functional level related to goals / functional outcomes: Unknown, pt did not return for further PT as discussed   Remaining deficits: Hemiparesis, hypertonicity, impaired strength, decreased ROM, motor planning/motor control, impaired postural control, impaired gait, balance, and increased falls risk   Education / Equipment: HEP  Plan: Patient agrees to discharge.  Patient goals were not met. Patient is being discharged due to not returning since the last visit.  ?????         PT Long Term Goals - 07/04/16 1103      PT LONG TERM GOAL #1   Title independent with HEP (Target Date for all LTGs: 05/20/16; 06/29/2016)   Baseline NEW TARGET DATE FOR REMAINING LTG IS 09/02/16    Time 8   Period Weeks   Status On-going     PT LONG TERM GOAL #2   Title amb > 500' on various indoor/outdoor surfaces with LRAD and AFO at supervision for improved community access   Baseline TARGET 09/02/16   Time 8   Period Weeks   Status Revised     PT LONG TERM GOAL #4   Title improve gait velocity to > 1.8 ft/sec for decreased fall risk and improved mobility   Baseline 28 seconds or 1.7f/sec; 1.39 ft/sec on 07/04/16   Time 8   Period Weeks   Status On-going     PT LONG TERM GOAL #5   Title Will improve TUG to <13 seconds for decreased falls risk with transitional movements (with AFO and quad cane)   Baseline 19 seconds; 16 seconds on 07/04/16   Time 8   Period Weeks   Status Revised     Additional Long Term Goals   Additional Long Term Goals Yes     PT LONG TERM GOAL #6   Title Will improve BERG balance score  to >45/56 to decrease risk for falls    Baseline 41/56; TARGET DATE 06/29/16; 46/56 MET on 07/04/16   Status Achieved     PT LONG TERM GOAL #7   Title Pt will decrease falls risk as indicated by improvement in BERG balance score to 50/56   Baseline 46/56; TARGET DATE 09/02/16   Time 8   Period Weeks   Status New     ARaylene Everts PT, DPT 10/03/16    9:25 AM    [address]sa[address] [place] [address]Phone: [phone]  Fax:  [phone]

## 2019-06-10 ENCOUNTER — Ambulatory Visit: Payer: Self-pay | Attending: Internal Medicine

## 2019-06-10 DIAGNOSIS — Z23 Encounter for immunization: Secondary | ICD-10-CM

## 2019-06-10 NOTE — Progress Notes (Signed)
   Covid-19 Vaccination Clinic  Name:  Adam Perez    MRN: [id number] DOB: June 11, 1955  06/10/2019  Adam Perez was observed post Covid-19 immunization for 15 minutes without incident. He was provided with Vaccine Information Sheet and instruction to access the V-Safe system.   Adam Perez was instructed to call 911 with any severe reactions post vaccine: Marland Kitchen Difficulty breathing  . Swelling of face and throat  . A fast heartbeat  . A bad rash all over body  . Dizziness and weakness   Immunizations Administered    Name Date Dose VIS Date Route   Moderna COVID-19 Vaccine 06/10/2019 10:47 AM 0.5 mL 02/09/2019 Intramuscular   Manufacturer: Moderna   Lot: [id number]   NDC: 61901-222-41

## 2019-07-05 ENCOUNTER — Observation Stay (HOSPITAL_COMMUNITY): Payer: Medicare Other

## 2019-07-05 ENCOUNTER — Observation Stay (HOSPITAL_COMMUNITY)
Admission: EM | Admit: 2019-07-05 | Discharge: 2019-07-06 | Disposition: A | Discharge status: Home / Self Care | Payer: Medicare Other | Attending: Family Medicine | Admitting: Family Medicine

## 2019-07-05 ENCOUNTER — Encounter (HOSPITAL_COMMUNITY): Payer: Self-pay | Admitting: *Deleted

## 2019-07-05 ENCOUNTER — Other Ambulatory Visit: Payer: Self-pay

## 2019-07-05 DIAGNOSIS — Z833 Family history of diabetes mellitus: Secondary | ICD-10-CM | POA: Insufficient documentation

## 2019-07-05 DIAGNOSIS — Z79899 Other long term (current) drug therapy: Secondary | ICD-10-CM | POA: Insufficient documentation

## 2019-07-05 DIAGNOSIS — Z7982 Long term (current) use of aspirin: Secondary | ICD-10-CM | POA: Insufficient documentation

## 2019-07-05 DIAGNOSIS — I10 Essential (primary) hypertension: Secondary | ICD-10-CM | POA: Diagnosis not present

## 2019-07-05 DIAGNOSIS — I69354 Hemiplegia and hemiparesis following cerebral infarction affecting left non-dominant side: Secondary | ICD-10-CM | POA: Diagnosis not present

## 2019-07-05 DIAGNOSIS — I6930 Unspecified sequelae of cerebral infarction: Secondary | ICD-10-CM

## 2019-07-05 DIAGNOSIS — E118 Type 2 diabetes mellitus with unspecified complications: Secondary | ICD-10-CM | POA: Diagnosis present

## 2019-07-05 DIAGNOSIS — R739 Hyperglycemia, unspecified: Secondary | ICD-10-CM | POA: Diagnosis not present

## 2019-07-05 DIAGNOSIS — I700 Atherosclerosis of aorta: Secondary | ICD-10-CM | POA: Insufficient documentation

## 2019-07-05 DIAGNOSIS — Z7902 Long term (current) use of antithrombotics/antiplatelets: Secondary | ICD-10-CM | POA: Insufficient documentation

## 2019-07-05 DIAGNOSIS — Z20822 Contact with and (suspected) exposure to covid-19: Secondary | ICD-10-CM | POA: Insufficient documentation

## 2019-07-05 DIAGNOSIS — F329 Major depressive disorder, single episode, unspecified: Secondary | ICD-10-CM | POA: Insufficient documentation

## 2019-07-05 DIAGNOSIS — E1110 Type 2 diabetes mellitus with ketoacidosis without coma: Secondary | ICD-10-CM | POA: Diagnosis present

## 2019-07-05 DIAGNOSIS — E1169 Type 2 diabetes mellitus with other specified complication: Secondary | ICD-10-CM | POA: Diagnosis present

## 2019-07-05 DIAGNOSIS — E1165 Type 2 diabetes mellitus with hyperglycemia: Secondary | ICD-10-CM | POA: Diagnosis present

## 2019-07-05 DIAGNOSIS — E1310 Other specified diabetes mellitus with ketoacidosis without coma: Secondary | ICD-10-CM | POA: Diagnosis not present

## 2019-07-05 DIAGNOSIS — E11621 Type 2 diabetes mellitus with foot ulcer: Secondary | ICD-10-CM | POA: Diagnosis present

## 2019-07-05 HISTORY — DX: Cerebral infarction, unspecified: I63.9

## 2019-07-05 HISTORY — DX: Hemiplegia, unspecified affecting unspecified side: G81.90

## 2019-07-05 HISTORY — DX: Essential (primary) hypertension: I10

## 2019-07-05 LAB — RESPIRATORY PANEL BY RT PCR (FLU A&B, COVID)
Influenza A by PCR: NEGATIVE
Influenza B by PCR: NEGATIVE
SARS Coronavirus 2 by RT PCR: NEGATIVE

## 2019-07-05 LAB — URINALYSIS, ROUTINE W REFLEX MICROSCOPIC
Bacteria, UA: NONE SEEN
Bilirubin Urine: NEGATIVE
Glucose, UA: >=500 mg/dL — AB
Hgb urine dipstick: NEGATIVE
Ketones, ur: 20 mg/dL — AB
Leukocytes,Ua: NEGATIVE
Nitrite: NEGATIVE
Protein, ur: NEGATIVE mg/dL
Specific Gravity, Urine: 1.017 (ref 1.005–1.030)
pH: 5 (ref 5.0–8.0)

## 2019-07-05 LAB — MRSA PCR SCREENING: MRSA by PCR: NEGATIVE

## 2019-07-05 LAB — BASIC METABOLIC PANEL
Anion gap: 16 — ABNORMAL HIGH (ref 5–15)
Anion gap: 11 (ref 5–15)
Anion gap: 9 (ref 5–15)
BUN: 20 mg/dL (ref 8–23)
BUN: 19 mg/dL (ref 8–23)
BUN: 18 mg/dL (ref 8–23)
CO2: 21 mmol/L — ABNORMAL LOW (ref 22–32)
CO2: 20 mmol/L — ABNORMAL LOW (ref 22–32)
CO2: 20 mmol/L — ABNORMAL LOW (ref 22–32)
Calcium: 9.8 mg/dL (ref 8.9–10.3)
Calcium: 9.4 mg/dL (ref 8.9–10.3)
Calcium: 9.4 mg/dL (ref 8.9–10.3)
Chloride: 104 mmol/L (ref 98–111)
Chloride: 111 mmol/L (ref 98–111)
Chloride: 115 mmol/L — ABNORMAL HIGH (ref 98–111)
Creatinine, Ser: 1.35 mg/dL — ABNORMAL HIGH (ref 0.61–1.24)
Creatinine, Ser: 1.17 mg/dL (ref 0.61–1.24)
Creatinine, Ser: 1.09 mg/dL (ref 0.61–1.24)
GFR calc Af Amer: >60 mL/min (ref >60)
GFR calc Af Amer: >60 mL/min (ref >60)
GFR calc Af Amer: >60 mL/min (ref >60)
GFR calc non Af Amer: 55 mL/min — ABNORMAL LOW (ref >60)
GFR calc non Af Amer: >60 mL/min (ref >60)
GFR calc non Af Amer: >60 mL/min (ref >60)
Glucose, Bld: 536 mg/dL (ref 70–99)
Glucose, Bld: 298 mg/dL — ABNORMAL HIGH (ref 70–99)
Glucose, Bld: 129 mg/dL — ABNORMAL HIGH (ref 70–99)
Potassium: 4.2 mmol/L (ref 3.5–5.1)
Potassium: 3.8 mmol/L (ref 3.5–5.1)
Potassium: 3.6 mmol/L (ref 3.5–5.1)
Sodium: 141 mmol/L (ref 135–145)
Sodium: 142 mmol/L (ref 135–145)
Sodium: 144 mmol/L (ref 135–145)

## 2019-07-05 LAB — GLUCOSE, CAPILLARY
Glucose-Capillary: 133 mg/dL — ABNORMAL HIGH (ref 70–99)
Glucose-Capillary: 478 mg/dL — ABNORMAL HIGH (ref 70–99)
Glucose-Capillary: 140 mg/dL — ABNORMAL HIGH (ref 70–99)
Glucose-Capillary: 182 mg/dL — ABNORMAL HIGH (ref 70–99)
Glucose-Capillary: 234 mg/dL — ABNORMAL HIGH (ref 70–99)
Glucose-Capillary: 211 mg/dL — ABNORMAL HIGH (ref 70–99)

## 2019-07-05 LAB — CBC
HCT: 38.8 % — ABNORMAL LOW (ref 39.0–52.0)
Hemoglobin: 12.9 g/dL — ABNORMAL LOW (ref 13.0–17.0)
MCH: 30 pg (ref 26.0–34.0)
MCHC: 33.2 g/dL (ref 30.0–36.0)
MCV: 90.2 fL (ref 80.0–100.0)
Platelets: 306 10*3/uL (ref 150–400)
RBC: 4.3 MIL/uL (ref 4.22–5.81)
RDW: 13.6 % (ref 11.5–15.5)
WBC: 6.8 10*3/uL (ref 4.0–10.5)
nRBC: 0 % (ref 0.0–0.2)

## 2019-07-05 LAB — BLOOD GAS, VENOUS
Acid-base deficit: 3.2 mmol/L — ABNORMAL HIGH (ref 0.0–2.0)
Bicarbonate: 19.5 mmol/L — ABNORMAL LOW (ref 20.0–28.0)
FIO2: 21
O2 Saturation: 26.9 %
Patient temperature: 37
pCO2, Ven: 53.4 mmHg (ref 44.0–60.0)
pH, Ven: 7.256 (ref 7.250–7.430)
pO2, Ven: <31 mmHg — CL (ref 32.0–45.0)

## 2019-07-05 LAB — CBG MONITORING, ED
Glucose-Capillary: 518 mg/dL (ref 70–99)
Glucose-Capillary: 400 mg/dL — ABNORMAL HIGH (ref 70–99)
Glucose-Capillary: 371 mg/dL — ABNORMAL HIGH (ref 70–99)

## 2019-07-05 LAB — MAGNESIUM: Magnesium: 2.8 mg/dL — ABNORMAL HIGH (ref 1.7–2.4)

## 2019-07-05 LAB — BETA-HYDROXYBUTYRIC ACID
Beta-Hydroxybutyric Acid: 3.69 mmol/L — ABNORMAL HIGH (ref 0.05–0.27)
Beta-Hydroxybutyric Acid: 2.26 mmol/L — ABNORMAL HIGH (ref 0.05–0.27)

## 2019-07-05 IMAGING — DX DG CHEST 1V PORT
1 series · 1 of 1 positions shown · non-contrast
Comparison: Radiograph [DATE]

CLINICAL DATA: Diabetic ketoacidosis.

EXAM:
PORTABLE CHEST 1 VIEW

[chest ap]
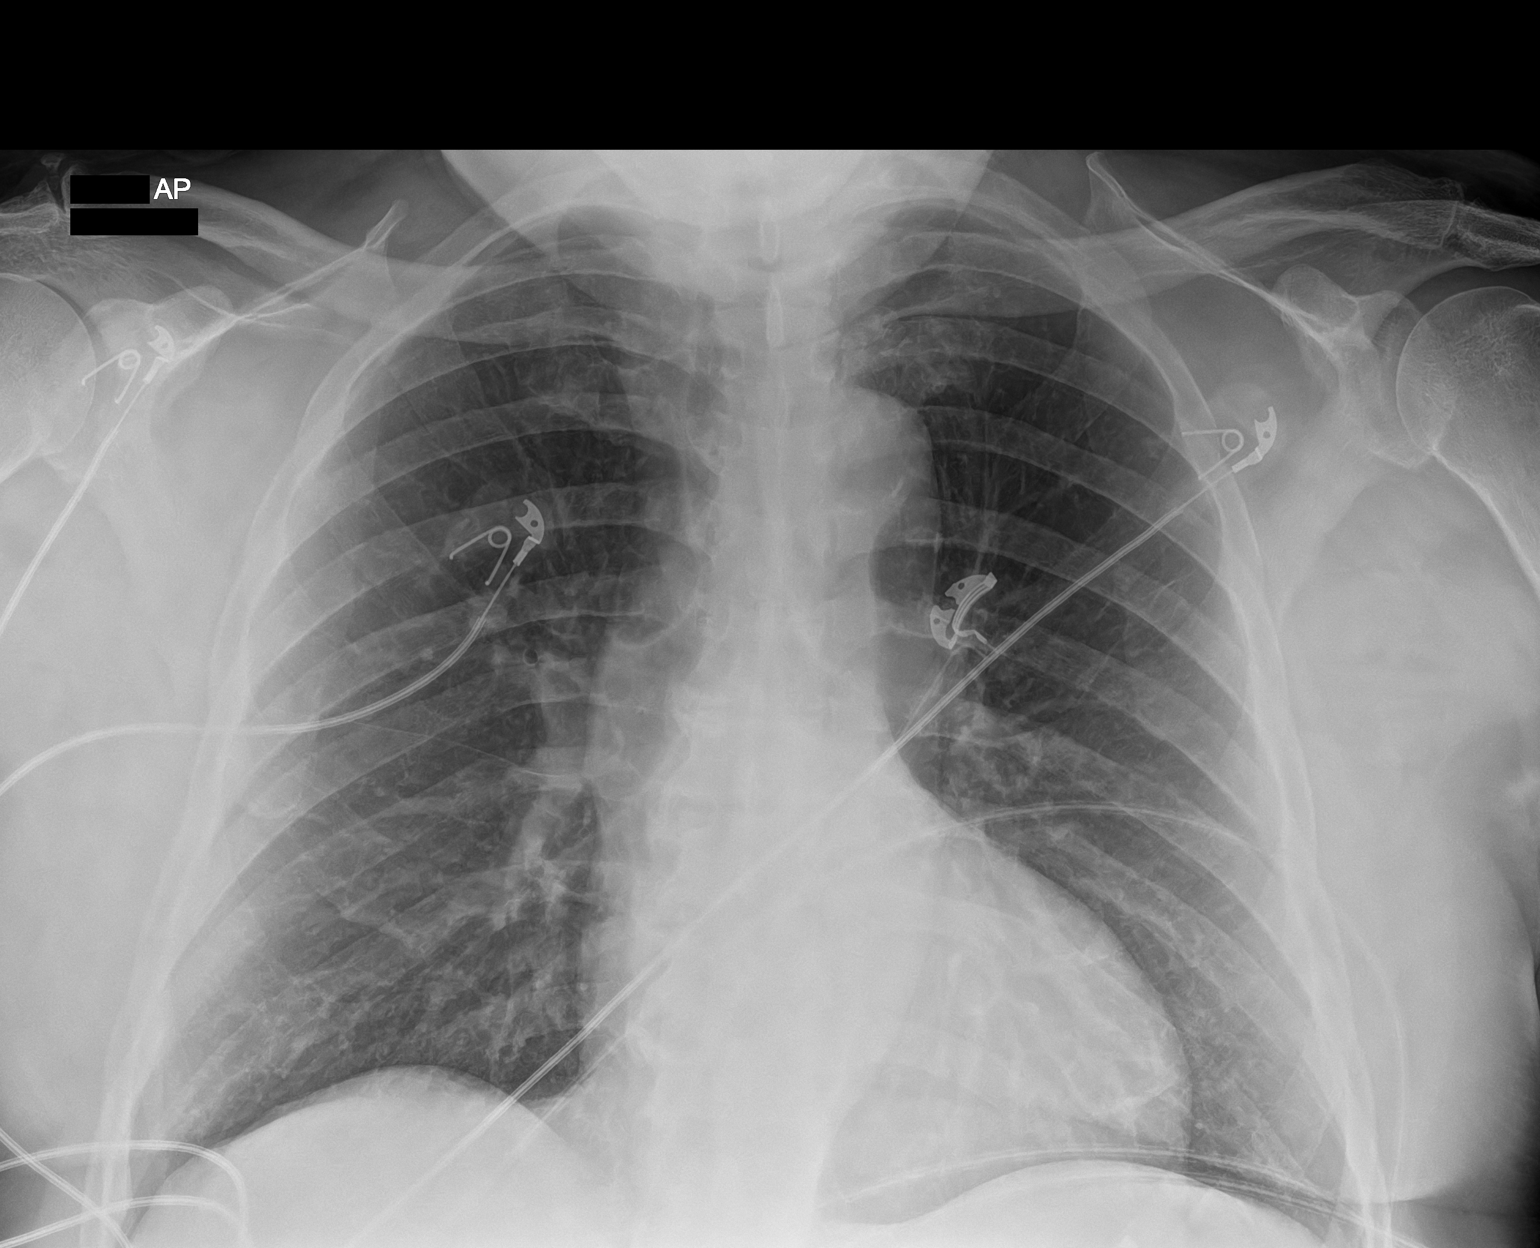

[1 of 1 positions shown; findings below may reference images not displayed]

FINDINGS: Bilateral EKG leads overlie the chest.The cardiomediastinal contours
are normal. Aortic atherosclerosis. The lungs are clear. Pulmonary
vasculature is normal. No consolidation, pleural effusion, or
pneumothorax. No acute osseous abnormalities are seen.
IMPRESSION: No acute chest findings.

Aortic Atherosclerosis ([HL]-[HL]).

## 2019-07-05 MED ORDER — POTASSIUM CHLORIDE 10 MEQ/100ML IV SOLN
10.0000 meq | INTRAVENOUS | Status: AC
  Administered 2019-07-05 (×2): 10 meq via INTRAVENOUS
  Filled 2019-07-05 (×2): qty 100

## 2019-07-05 MED ORDER — KCL IN DEXTROSE-NACL 10-5-0.45 MEQ/L-%-% IV SOLN
INTRAVENOUS | Status: DC
  Filled 2019-07-05 (×4): qty 1000
  Filled 2019-07-05: qty 2000
  Filled 2019-07-05: qty 1000

## 2019-07-05 MED ORDER — ONDANSETRON HCL 4 MG PO TABS: 4.0000 mg | ORAL_TABLET | Freq: Four times a day (QID) | ORAL | Status: DC | PRN

## 2019-07-05 MED ORDER — ONDANSETRON HCL 4 MG/2ML IJ SOLN: 4.0000 mg | Freq: Four times a day (QID) | INTRAMUSCULAR | Status: DC | PRN

## 2019-07-05 MED ORDER — DEXTROSE 50 % IV SOLN: 0.0000 mL | INTRAVENOUS | Status: DC | PRN

## 2019-07-05 MED ORDER — SODIUM CHLORIDE 0.45 % IV SOLN
INTRAVENOUS | Status: DC
  Filled 2019-07-05 (×5): qty 1000

## 2019-07-05 MED ORDER — DEXTROSE-NACL 5-0.45 % IV SOLN: INTRAVENOUS | Status: DC

## 2019-07-05 MED ORDER — ASPIRIN EC 81 MG PO TBEC
81.0000 mg | DELAYED_RELEASE_TABLET | Freq: Every day | ORAL | Status: DC
  Administered 2019-07-06: 81 mg via ORAL
  Filled 2019-07-05: qty 1

## 2019-07-05 MED ORDER — ACETAMINOPHEN 325 MG PO TABS: 650.0000 mg | ORAL_TABLET | Freq: Four times a day (QID) | ORAL | Status: DC | PRN

## 2019-07-05 MED ORDER — ACETAMINOPHEN 650 MG RE SUPP: 650.0000 mg | Freq: Four times a day (QID) | RECTAL | Status: DC | PRN

## 2019-07-05 MED ORDER — ENOXAPARIN SODIUM 40 MG/0.4ML ~~LOC~~ SOLN
40.0000 mg | SUBCUTANEOUS | Status: DC
  Administered 2019-07-06: 40 mg via SUBCUTANEOUS

## 2019-07-05 MED ORDER — INSULIN REGULAR(HUMAN) IN NACL 100-0.9 UT/100ML-% IV SOLN
INTRAVENOUS | Status: DC
  Administered 2019-07-05: 16:00:00 15 [IU]/h via INTRAVENOUS
  Administered 2019-07-06: 03:00:00 6 [IU]/h via INTRAVENOUS
  Filled 2019-07-05 (×2): qty 100

## 2019-07-05 MED ORDER — SODIUM CHLORIDE 0.9% FLUSH
3.0000 mL | Freq: Two times a day (BID) | INTRAVENOUS | Status: DC
  Administered 2019-07-05 – 2019-07-06 (×2): 3 mL via INTRAVENOUS

## 2019-07-05 MED ORDER — SODIUM CHLORIDE 0.9 % IV SOLN: INTRAVENOUS | Status: DC

## 2019-07-05 NOTE — ED Provider Notes (Signed)
Emergency Department Provider Note   I have reviewed the triage vital signs and the nursing notes.   HISTORY  Chief Complaint No chief complaint on file.   HPI Adam Perez is a 64 y.o. 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  Patient denies any upper respiratory infection symptoms or fever.  No sore throat.  He has not been in contact with a known Covid positive individual.  He denies any prior history of known diabetes.  He is followed primarily at the [facility] and had a recent PCP appointment within the last several months and was not told of elevated blood sugars.  He denies any abdominal or back pain.  No chest pain or shortness of breath.  No radiation of symptoms or other modifying factors.  Past Medical History:  Diagnosis Date  . Hemiparesis ([place])    left side; residual from prior acute ischemic CVA  . Ischemic cerebrovascular accident (CVA) [facility])    with residual left hemiparesis    Patient Active Problem List   Diagnosis Date Noted  . DKA (diabetic ketoacidoses) ([place]) 07/05/2019  . Spastic hemiparesis of left dominant side ([place]) 05/13/2016  . 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 No pertinent surgical history.  Allergies Patient has no known allergies.  History reviewed. No pertinent family history.  Social History Social History   Tobacco Use  . Smoking status: Never Smoker  . Smokeless tobacco: Never Used  Substance Use Topics  . Alcohol use: No  . Drug use: Not on file    Review of Systems  Constitutional: No fever/chills. Positive generalized weakness.  Eyes: No visual changes. ENT: No sore throat. Loss of taste and increased thirst.  Cardiovascular: Denies chest pain. Respiratory: Denies shortness of breath. Gastrointestinal: No abdominal pain.  No nausea, no vomiting.  No diarrhea.  No  constipation. Genitourinary: Negative for dysuria. Polyuria.  Musculoskeletal: Negative for back pain. Skin: Negative for rash. Neurological: Negative for headaches. Baseline left side weakness.   10-point ROS otherwise negative.  ____________________________________________   PHYSICAL EXAM:  VITAL SIGNS: ED Triage Vitals  Enc Vitals Group     BP 07/05/19 1129 (!) 147/92     Pulse Rate 07/05/19 1129 89     Resp 07/05/19 1129 18     Temp 07/05/19 1129 97.8 F (36.6 C)     Temp Source 07/05/19 1129 Oral     SpO2 07/05/19 1129 99 %     Weight 07/05/19 1134 245 lb (111.1 kg)     Height 07/05/19 1134 5\' 7"  (1.702 m)   Constitutional: Alert and oriented. Well appearing and in no acute distress. Eyes: Conjunctivae are normal.  Head: Atraumatic. Nose: No congestion/rhinnorhea. Mouth/Throat: Mucous membranes are dry.  Neck: No stridor.  Cardiovascular: Normal rate, regular rhythm. Good peripheral circulation. Grossly normal heart sounds.   Respiratory: Normal respiratory effort.  No retractions. Lungs CTAB. Gastrointestinal: Soft and nontender. No distention.  Musculoskeletal: No lower extremity tenderness nor edema. No gross deformities of extremities. Neurologic:  Normal speech and language. Spastic hemiparesis on the left.  Skin:  Skin is warm, dry and intact. No rash noted.   ____________________________________________   LABS (all labs ordered are listed, but only abnormal results are displayed)  Labs Reviewed  BASIC METABOLIC PANEL - Abnormal; Notable for the following components:      Result Value  CO2 21 (*)    Glucose, Bld 536 (*)    Creatinine, Ser 1.35 (*)    GFR calc non Af Amer 55 (*)    Anion gap 16 (*)    All other components within normal limits  CBC - Abnormal; Notable for the following components:   Hemoglobin 12.9 (*)    HCT 38.8 (*)    All other components within normal limits  BETA-HYDROXYBUTYRIC ACID - Abnormal; Notable for the following  components:   Beta-Hydroxybutyric Acid 3.69 (*)    All other components within normal limits  URINALYSIS, ROUTINE W REFLEX MICROSCOPIC - Abnormal; Notable for the following components:   Color, Urine STRAW (*)    Glucose, UA >=500 (*)    Ketones, ur 20 (*)    All other components within normal limits  BLOOD GAS, VENOUS - Abnormal; Notable for the following components:   pO2, Ven <31.0 (*)    Bicarbonate 19.5 (*)    Acid-base deficit 3.2 (*)    All other components within normal limits  CBG MONITORING, ED - Abnormal; Notable for the following components:   Glucose-Capillary 518 (*)    All other components within normal limits  CBG MONITORING, ED - Abnormal; Notable for the following components:   Glucose-Capillary 400 (*)    All other components within normal limits  CBG MONITORING, ED - Abnormal; Notable for the following components:   Glucose-Capillary 371 (*)    All other components within normal limits  RESPIRATORY PANEL BY RT PCR (FLU A&B, COVID)  HIV ANTIBODY (ROUTINE TESTING W REFLEX)  MAGNESIUM  COMPREHENSIVE METABOLIC PANEL  CBC  PHOSPHORUS  HEMOGLOBIN A1C  MAGNESIUM  BASIC METABOLIC PANEL  BASIC METABOLIC PANEL  RAPID URINE DRUG SCREEN, HOSP PERFORMED  CREATININE, URINE, RANDOM  SODIUM, URINE, RANDOM  BETA-HYDROXYBUTYRIC ACID  BASIC METABOLIC PANEL    ____________________________________________  RADIOLOGY  None  ____________________________________________   PROCEDURES  Procedure(s) performed:   .Critical Care Performed by: Maia Plan, MD Authorized by: Maia Plan, MD   Critical care provider statement:    Critical care time (minutes):  35   Critical care time was exclusive of:  Separately billable procedures and treating other patients and teaching time   Critical care was necessary to treat or prevent imminent or life-threatening deterioration of the following conditions:  Endocrine crisis (DKA)   Critical care was time spent personally  by me on the following activities:  Discussions with consultants, evaluation of patient's response to treatment, examination of patient, ordering and performing treatments and interventions, ordering and review of laboratory studies, ordering and review of radiographic studies, pulse oximetry, re-evaluation of patient's condition, obtaining history from patient or surrogate, review of old charts, blood draw for specimens and development of treatment plan with patient or surrogate   I assumed direction of critical care for this patient from another provider in my specialty: no      ____________________________________________   INITIAL IMPRESSION / ASSESSMENT AND PLAN / ED COURSE  Pertinent labs & imaging results that were available during my care of the patient were reviewed by me and considered in my medical decision making (see chart for details).   Patient presents emergency department for evaluation of polyuria, polydipsia, loss of taste.  No URI symptoms to suspect Covid but will send a swab.  Lab work from triage showing patient in DKA with glucose of 536, CO2 of 21, anion gap of 16.  I have sent a VBG and will start the patient  on the insulin infusion along with potassium supplementation.  No prior history of diabetes.   Discussed patient's case with [facility], Dr. Arlean Hopping to request admission. Patient and family (if present) updated with plan. Care transferred to [facility] service.  I reviewed all nursing notes, vitals, pertinent old records, EKGs, labs, imaging (as available).  ____________________________________________  FINAL CLINICAL IMPRESSION(S) / ED DIAGNOSES  Final diagnoses:  Diabetic ketoacidosis without coma associated with other specified diabetes mellitus (HCC)     MEDICATIONS GIVEN DURING THIS VISIT:  Medications  insulin regular, human (MYXREDLIN) 100 units/ 100 mL infusion (17 Units/hr Intravenous Rate/Dose Change 07/05/19 1729)  dextrose 50 % solution 0-50 mL (has no  administration in time range)  potassium chloride 10 mEq in 100 mL IVPB (10 mEq Intravenous New Bag/Given 07/05/19 1727)  sodium chloride 0.45 % 1,000 mL with potassium chloride 10 mEq infusion (has no administration in time range)  dextrose 5 % and 0.45 % NaCl with KCl 10 mEq/L infusion (has no administration in time range)  aspirin EC tablet 81 mg (has no administration in time range)  sodium chloride flush (NS) 0.9 % injection 3 mL (has no administration in time range)  enoxaparin (LOVENOX) injection 40 mg (has no administration in time range)  acetaminophen (TYLENOL) tablet 650 mg (has no administration in time range)    Or  acetaminophen (TYLENOL) suppository 650 mg (has no administration in time range)  ondansetron (ZOFRAN) tablet 4 mg (has no administration in time range)    Or  ondansetron (ZOFRAN) injection 4 mg (has no administration in time range)    Note:  This document was prepared using Dragon voice recognition software and may include unintentional dictation errors.  Alona Bene, MD, FACEP Emergency Medicine;ad   Circe Chilton, Arlyss Repress, MD 07/05/19 [phone]

## 2019-07-05 NOTE — ED Triage Notes (Signed)
No sense of taste for 7 days

## 2019-07-05 NOTE — H&P (Signed)
History and Physical    PLEASE NOTE THAT DRAGON DICTATION SOFTWARE WAS USED IN THE CONSTRUCTION OF THIS NOTE.   DEONTRE ALLSUP VXY:[id number] DOB: [birth year] DOA: 07/05/2019  [facility]: [facility] coming from: home   I have personally briefly reviewed patient's old medical records in [facility]  Chief Complaint: increased thirst and increased urination  HPI: JAKOBI THETFORD is a 64 y.o. male with medical history significant for ischemic CVA with residual left spastic hemiparesis, hypertension, who is admitted to [facility] on 07/05/2019 with suspected diabetic ketoacidosis in the setting of new diagnosis of diabetes after presenting from home to [facility] Emergency Department complaining of increased thirst and increased urination.   The patient reports 7 to 10 days of increased thirst as well as increased urination in spite of no change in his overall intake of food or fluid over that time.  Denies any associated nausea, vomiting, diarrhea, or abdominal discomfort.  He denies any personal history of underlying diabetes.  He reports that he receives the majority of his medical care through the [place], and that he most recently followed with his associated PCP approximately 6 months ago, at which time he does not believe that he was told of any elevated blood sugar.  He confirms that he has never previously been on insulin or any oral hypoglycemic agents.  Reports a family history of diabetes in 2 of his brothers, both of which have passed away in the setting of "diabetic coma", according to the patient.  He denies any recent subjective fever, chills, rigors, or generalized myalgias. Denies any recent headache, neck stiffness, rhinitis, rhinorrhea, sore throat, sob, cough, or rash. No recent traveling or known COVID-19 exposures. Denies dysuria or gross hematuria.  Denies any recent chest pain, diaphoresis, palpitations, dizziness, pre-syncope, syncope, or vertigo.   Past  medical history notable for history of ischemic CVA with residual left-sided spastic hemiparesis.  The patient reports that he is on a daily baby aspirin as a sole outpatient antiplatelet medication, and denies any formal anti-coagulation at home. He specifically denies taking Plavix as an outpatient. Denies any alcohol consumption, reports that he is a lifelong non-smoker, denies any use of recreational drugs.    ED Course:  Vital signs in the ED were notable for the following: Temperature max 97.8; heart rate 89; blood pressure 147/92; respiratory rate 18; oxygen saturation 99% on room air.  Labs were notable for the following: Point-of-care glucose of 518; BMP notable for sodium 141, potassium 4.2, bicarbonate 21, anion gap 16, creatinine 1.35.  Beta hydroxybutyric acid 3.69.  Urinalysis notable for no white blood cells, no bacteria, nitrate negative, leukocyte esterase negative, no RBCs, but showed 500 glucose and 20 ketones.  CBC notable for white blood cell count of 6800.  Nasopharyngeal COVID-19 PCR pain in the ED today, with result currently pending.  While in the ED, the following were administered: IV normal saline at 75 cc/h; potassium chloride 20 mEq IV over 2 hours x 1, and insulin drip was initiated.  Subsequently, the patient was admitted to the stepdown unit for further evaluation and management of suspected diabetic ketoacidosis in the context of a new diagnosis of diabetes.     Review of Systems: As per HPI otherwise 10 point review of systems negative.   Past Medical History:  Diagnosis Date  . Hemiparesis (HCC)    left side; residual from prior acute ischemic CVA  . Ischemic cerebrovascular accident (CVA) (HCC)  with residual left hemiparesis    History reviewed. No pertinent surgical history.  Social History:  reports that he has never smoked. He has never used smokeless tobacco. He reports that he does not drink alcohol. No history on file for drug.   No Known  Allergies  Family History  Problem Relation Age of Onset  . Diabetes Brother   . Diabetes Brother     Prior to Admission medications   Medication Sig Start Date End Date Taking? Authorizing Provider  amLODipine (NORVASC) 10 MG tablet Take 10 mg by mouth daily.    [provider]  aspirin EC 81 MG tablet Take 81 mg by mouth daily.    [provider]  baclofen (LIORESAL) 20 MG tablet Take 20 mg by mouth 3 (three) times daily.    [provider]  clopidogrel (PLAVIX) 75 MG tablet Take 75 mg by mouth daily.    [provider]  guaiFENesin (ROBITUSSIN) 100 MG/5ML liquid Take 200 mg by mouth 3 (three) times daily as needed for cough.    [provider]  lisinopril (PRINIVIL,ZESTRIL) 5 MG tablet Take 5 mg by mouth daily.    [provider]     Objective    Physical Exam: Vitals:   07/05/19 1129 07/05/19 1134  BP: (!) 147/92   Pulse: 89   Resp: 18   Temp: 97.8 F (36.6 C)   TempSrc: Oral   SpO2: 99%   Weight:  111.1 kg  Height:  5\' 7"  (1.702 m)    General: appears to be stated age; alert, oriented Skin: warm, dry, no rash Head:  AT/[place] Eyes:  PEARL b/l, EOMI Mouth:  Oral mucosa membranes appear dry, normal dentition Neck: supple; trachea midline Heart:  RRR; did not appreciate any M/R/G Lungs: CTAB, did not appreciate any wheezes, rales, or rhonchi Abdomen: + BS; soft, ND, NT Vascular: 2+ pedal pulses b/l; 2+ radial pulses b/l Extremities: no peripheral edema, no muscle wasting   Labs on Admission: I have personally reviewed following labs and imaging studies  CBC: Recent Labs  Lab 07/05/19 1213  WBC 6.8  HGB 12.9*  HCT 38.8*  MCV 90.2  PLT 306   Basic Metabolic Panel: Recent Labs  Lab 07/05/19 1213  NA 141  K 4.2  CL 104  CO2 21*  GLUCOSE 536*  BUN 20  CREATININE 1.35*  CALCIUM 9.8   GFR: Estimated Creatinine Clearance: 65.8 mL/min (A) (by C-G formula based on SCr of 1.35 mg/dL (H)). Liver  Function Tests: No results for input(s): AST, ALT, ALKPHOS, BILITOT, PROT, ALBUMIN in the last 168 hours. No results for input(s): LIPASE, AMYLASE in the last 168 hours. No results for input(s): AMMONIA in the last 168 hours. Coagulation Profile: No results for input(s): INR, PROTIME in the last 168 hours. Cardiac Enzymes: No results for input(s): CKTOTAL, CKMB, CKMBINDEX, TROPONINI in the last 168 hours. BNP (last 3 results) No results for input(s): PROBNP in the last 8760 hours. HbA1C: No results for input(s): HGBA1C in the last 72 hours. CBG: Recent Labs  Lab 07/05/19 1139 07/05/19 1606  GLUCAP 518* 400*   Lipid Profile: No results for input(s): CHOL, HDL, LDLCALC, TRIG, CHOLHDL, LDLDIRECT in the last 72 hours. Thyroid Function Tests: No results for input(s): TSH, T4TOTAL, FREET4, T3FREE, THYROIDAB in the last 72 hours. Anemia Panel: No results for input(s): VITAMINB12, FOLATE, FERRITIN, TIBC, IRON, RETICCTPCT in the last 72 hours. Urine analysis:    Component Value Date/Time   COLORURINE STRAW (  A) 07/05/2019 1557   APPEARANCEUR CLEAR 07/05/2019 1557   LABSPEC 1.017 07/05/2019 1557   PHURINE 5.0 07/05/2019 1557   GLUCOSEU >=500 (A) 07/05/2019 1557   HGBUR NEGATIVE 07/05/2019 1557   BILIRUBINUR NEGATIVE 07/05/2019 1557   KETONESUR 20 (A) 07/05/2019 1557   PROTEINUR NEGATIVE 07/05/2019 1557   NITRITE NEGATIVE 07/05/2019 1557   LEUKOCYTESUR NEGATIVE 07/05/2019 1557    Radiological Exams on Admission: No results found.   Assessment/Plan   JARION HAWTHORNE is a 64 y.o. male with medical history significant for ischemic CVA with residual left spastic hemiparesis, hypertension, who is admitted to [facility] on 07/05/2019 with suspected diabetic ketoacidosis in the setting of new diagnosis of diabetes after presenting from home to [facility] Emergency Department complaining of increased thirst and increased urination.    Principal Problem:   DKA (diabetic  ketoacidoses) (HCC) Active Problems:   Hyperglycemia   History of ischemic cerebrovascular accident (CVA) with residual deficit   HTN (hypertension)    #) Diabetic ketoacidosis: In the setting of no known history of diabetes, patient presents with 7 to 10 days of progressive polydipsia/polyuria, with presenting blood sugar per POC glucose of 518, with evidence of anion gap metabolic acidosis, elevated beta hydroxybutyric acid, and ketonuria. Additional presenting labs notable for non-corrected serum sodium of 141 and serum potassium of 4.2, for which the patient has received 20 mill colons of IV potassium chloride in the ED.  Given the above presentation, suspect unintentional suboptimal glycemic management as an outpatient in the absence of any previously known diagnosis of diabetes.  Will initiate autoimmune evaluation for underlying diabetes as well as check A1c. no evidence at this time to suggest underlying organic precipitating factors, including no evidence of underlying infectious etiology, including urinalysis which shows no evidence of urinary tract infection.  Of note, nasopharyngeal COVID-19 PCR was collected in the ED today, with result currently pending.  In the context of an apparent new diagnosis of diabetes with patient possessing no prior experience with blood sugar checks nor administration of insulin, it appears that he would benefit from consultation via inpatient diabetic educator, to which the patient consents.  Insulin drip initiated in the ED, along with normal saline at 75 cc/h following administration of potassium chloride, as above.   Plan: DKA protocol initiated. Insulin drip per protocol. Q1 hour Accuchecks. Q4H BMP's in order to monitor ensuing anion gap, serum sodium, serum potassium have been ordered through 0800 on 07/06/19. Clear liquid diet.  Given presenting serum potassium level, will change current IV fluids to 1/2NS with 33mEq/L KCl at 100 cc/hr, with plan to  change to D5-1/2NS with 10 mEq/L KCl when CBG < 250 mg/dl.  Add on serum magnesium level.  Check serum phosphorus level. In the morning, once anion gap has closed and patient tolerating PO, will plan to initiate basal insulin while continuing insulin drip for an additional two hours before ultimately turning off the insulin drip, although will plan to keep patient on insulin drip overnight, even if AG closes sooner than this.  Monitor on telemetry.  Monitor strict I's and O's.  Check hemoglobin A1c.  Check M-Tab beta islet cell antibodies, as above.  An inpatient consult to the diabetic educator has been placed. Will also obtain CXR and EKG at this time, for completeness of DKA work-up. Check UDS. Request for patient's records from [place] has been ordered, including documentation from most recent PCP appointment.       #) Essential Hypertension: home  antihypertensive regimen includes Norvasc and lisinopril.  Systolic blood pressure in the ED today noted to be in the 140s mmHg.    Plan: Close monitoring of subsequent blood pressure via routine vital signs.  Close monitoring of renal function, including strict I's and O's and daily weights.  Additionally, repeat BMP in the morning. Will hold home antihypertensives overnight, with reevaluation of hemodynamic picture in the morning to guide timing of resumption of home antihypertensives.     #) Elevated creatinine: Presenting serum creatinine noted to be 1.35, although the acuity of this finding is unclear, as no prior creatinine dated point currently available in our EMR to assist with delineation of stage III CKD versus acute kidney injury.  Present urinalysis, with results as above.  Plan: Records requested from [place], as further described above.  Monitor strict I's and O's and daily weights.  Attempt to avoid nephrotoxic agents. Follow for results of serial BMPs, which are been ordered as component of evaluation and management of DKA.  Holding home  lisinopril for now [facility] as well as trending of interval vital signs and renal function, as above.  Check random urine sodium as well as random urine creatinine.      #) History of acute ischemic CVA: Associated with residual left spastic hemiparesis.  No evidence of acute focal neurologic deficits at this time.  The patient reports that his outpatient antiplatelet regimen is limited to a daily baby aspirin, and specifically denies taking Plavix at home.   Plan: We will monitor for inpatient pharmacy as reconciliation of outpatient antiplatelet regimen. For now, I have reordered the patient's reported daily baby aspirin, while holding Plavix.     DVT prophylaxis: Lovenox 40 mg subcu daily Code Status: Full code Family Communication: none Disposition Plan: Per Rounding Team Consults called: none  Admission status: Observation; stepdown unit    PLEASE NOTE THAT DRAGON DICTATION SOFTWARE WAS USED IN THE CONSTRUCTION OF THIS NOTE.   [place] [facility] Pager [phone] From 3PM- 11PM.   Otherwise, please contact night-coverage  [email or web address] Password [email or web address]

## 2019-07-05 NOTE — ED Notes (Signed)
Date and time results received: 07/05/19 1620 (use smartphrase ".now" to insert current time)  Test: pO2 Critical Value:Less than 31  Name of Provider Notified:Dr Long Orders Received? Or Actions Taken?:NA

## 2019-07-05 NOTE — ED Notes (Signed)
Pt c/o dry mouth, increase urination and has not felt well for past several days.  Pt states his brothers had diabetes as well.  Pt sees the [place] as his PCP and had been checked for diabetes about 6 months ago per pt.

## 2019-07-05 NOTE — ED Notes (Signed)
Date and time results received: 07/05/19 1255 (use smartphrase ".now" to insert current time)  Test: glucose Critical Value: 536  Name of Provider Notified: [email or web address]  Orders Received? Or Actions Taken?:

## 2019-07-06 ENCOUNTER — Encounter (HOSPITAL_COMMUNITY): Payer: Self-pay | Admitting: Internal Medicine

## 2019-07-06 DIAGNOSIS — E1169 Type 2 diabetes mellitus with other specified complication: Secondary | ICD-10-CM | POA: Diagnosis present

## 2019-07-06 DIAGNOSIS — E1165 Type 2 diabetes mellitus with hyperglycemia: Secondary | ICD-10-CM | POA: Diagnosis present

## 2019-07-06 DIAGNOSIS — I6930 Unspecified sequelae of cerebral infarction: Secondary | ICD-10-CM

## 2019-07-06 DIAGNOSIS — E11621 Type 2 diabetes mellitus with foot ulcer: Secondary | ICD-10-CM | POA: Diagnosis present

## 2019-07-06 DIAGNOSIS — I10 Essential (primary) hypertension: Secondary | ICD-10-CM | POA: Diagnosis present

## 2019-07-06 DIAGNOSIS — E1110 Type 2 diabetes mellitus with ketoacidosis without coma: Secondary | ICD-10-CM

## 2019-07-06 DIAGNOSIS — E118 Type 2 diabetes mellitus with unspecified complications: Secondary | ICD-10-CM | POA: Diagnosis present

## 2019-07-06 DIAGNOSIS — R739 Hyperglycemia, unspecified: Secondary | ICD-10-CM | POA: Diagnosis present

## 2019-07-06 LAB — COMPREHENSIVE METABOLIC PANEL
ALT: 21 U/L (ref 0–44)
AST: 17 U/L (ref 15–41)
Albumin: 3.7 g/dL (ref 3.5–5.0)
Alkaline Phosphatase: 80 U/L (ref 38–126)
Anion gap: 12 (ref 5–15)
BUN: 17 mg/dL (ref 8–23)
CO2: 20 mmol/L — ABNORMAL LOW (ref 22–32)
Calcium: 9.1 mg/dL (ref 8.9–10.3)
Chloride: 114 mmol/L — ABNORMAL HIGH (ref 98–111)
Creatinine, Ser: 1.04 mg/dL (ref 0.61–1.24)
GFR calc Af Amer: >60 mL/min (ref >60)
GFR calc non Af Amer: >60 mL/min (ref >60)
Glucose, Bld: 130 mg/dL — ABNORMAL HIGH (ref 70–99)
Potassium: 3.5 mmol/L (ref 3.5–5.1)
Sodium: 146 mmol/L — ABNORMAL HIGH (ref 135–145)
Total Bilirubin: 0.8 mg/dL (ref 0.3–1.2)
Total Protein: 6.7 g/dL (ref 6.5–8.1)

## 2019-07-06 LAB — BASIC METABOLIC PANEL
Anion gap: 13 (ref 5–15)
Anion gap: 9 (ref 5–15)
BUN: 17 mg/dL (ref 8–23)
BUN: 20 mg/dL (ref 8–23)
CO2: 19 mmol/L — ABNORMAL LOW (ref 22–32)
CO2: 23 mmol/L (ref 22–32)
Calcium: 9 mg/dL (ref 8.9–10.3)
Calcium: 9.1 mg/dL (ref 8.9–10.3)
Chloride: 112 mmol/L — ABNORMAL HIGH (ref 98–111)
Chloride: 114 mmol/L — ABNORMAL HIGH (ref 98–111)
Creatinine, Ser: 1.03 mg/dL (ref 0.61–1.24)
Creatinine, Ser: 1.24 mg/dL (ref 0.61–1.24)
GFR calc Af Amer: >60 mL/min (ref >60)
GFR calc Af Amer: >60 mL/min (ref >60)
GFR calc non Af Amer: >60 mL/min (ref >60)
GFR calc non Af Amer: >60 mL/min (ref >60)
Glucose, Bld: 140 mg/dL — ABNORMAL HIGH (ref 70–99)
Glucose, Bld: 226 mg/dL — ABNORMAL HIGH (ref 70–99)
Potassium: 3.4 mmol/L — ABNORMAL LOW (ref 3.5–5.1)
Potassium: 3.5 mmol/L (ref 3.5–5.1)
Sodium: 144 mmol/L (ref 135–145)
Sodium: 146 mmol/L — ABNORMAL HIGH (ref 135–145)

## 2019-07-06 LAB — HEMOGLOBIN A1C
Hgb A1c MFr Bld: 12.4 % — ABNORMAL HIGH (ref 4.8–5.6)
Mean Plasma Glucose: 309.18 mg/dL

## 2019-07-06 LAB — CBC
HCT: 34.7 % — ABNORMAL LOW (ref 39.0–52.0)
Hemoglobin: 11.3 g/dL — ABNORMAL LOW (ref 13.0–17.0)
MCH: 29.5 pg (ref 26.0–34.0)
MCHC: 32.6 g/dL (ref 30.0–36.0)
MCV: 90.6 fL (ref 80.0–100.0)
Platelets: 225 10*3/uL (ref 150–400)
RBC: 3.83 MIL/uL — ABNORMAL LOW (ref 4.22–5.81)
RDW: 13.5 % (ref 11.5–15.5)
WBC: 7 10*3/uL (ref 4.0–10.5)
nRBC: 0 % (ref 0.0–0.2)

## 2019-07-06 LAB — GLUCOSE, CAPILLARY
Glucose-Capillary: 111 mg/dL — ABNORMAL HIGH (ref 70–99)
Glucose-Capillary: 138 mg/dL — ABNORMAL HIGH (ref 70–99)
Glucose-Capillary: 164 mg/dL — ABNORMAL HIGH (ref 70–99)
Glucose-Capillary: 188 mg/dL — ABNORMAL HIGH (ref 70–99)
Glucose-Capillary: 194 mg/dL — ABNORMAL HIGH (ref 70–99)
Glucose-Capillary: 194 mg/dL — ABNORMAL HIGH (ref 70–99)
Glucose-Capillary: 212 mg/dL — ABNORMAL HIGH (ref 70–99)
Glucose-Capillary: 217 mg/dL — ABNORMAL HIGH (ref 70–99)
Glucose-Capillary: 263 mg/dL — ABNORMAL HIGH (ref 70–99)
Glucose-Capillary: 276 mg/dL — ABNORMAL HIGH (ref 70–99)
Glucose-Capillary: 280 mg/dL — ABNORMAL HIGH (ref 70–99)
Glucose-Capillary: 347 mg/dL — ABNORMAL HIGH (ref 70–99)

## 2019-07-06 LAB — HIV ANTIBODY (ROUTINE TESTING W REFLEX): HIV Screen 4th Generation wRfx: NONREACTIVE

## 2019-07-06 LAB — PHOSPHORUS: Phosphorus: 2.6 mg/dL (ref 2.5–4.6)

## 2019-07-06 LAB — MAGNESIUM: Magnesium: 2.6 mg/dL — ABNORMAL HIGH (ref 1.7–2.4)

## 2019-07-06 MED ORDER — INSULIN ASPART 100 UNIT/ML ~~LOC~~ SOLN
2.0000 [IU] | Freq: Three times a day (TID) | SUBCUTANEOUS | Status: DC
  Administered 2019-07-06: 2 [IU] via SUBCUTANEOUS

## 2019-07-06 MED ORDER — INSULIN GLARGINE 100 UNIT/ML ~~LOC~~ SOLN
10.0000 [IU] | Freq: Every day | SUBCUTANEOUS | Status: DC
  Administered 2019-07-06: 10 [IU] via SUBCUTANEOUS
  Filled 2019-07-06 (×2): qty 0.1

## 2019-07-06 MED ORDER — ATORVASTATIN CALCIUM 40 MG PO TABS: 40.0000 mg | ORAL_TABLET | Freq: Every day | ORAL | 5 refills | Status: DC

## 2019-07-06 MED ORDER — BACLOFEN 20 MG PO TABS: 20.0000 mg | ORAL_TABLET | Freq: Three times a day (TID) | ORAL | 3 refills | Status: DC

## 2019-07-06 MED ORDER — INSULIN ASPART 100 UNIT/ML ~~LOC~~ SOLN
10.0000 [IU] | Freq: Once | SUBCUTANEOUS | Status: AC
  Administered 2019-07-06: 10 [IU] via SUBCUTANEOUS

## 2019-07-06 MED ORDER — BLOOD GLUCOSE METER KIT: PACK | 3 refills | Status: DC

## 2019-07-06 MED ORDER — SERTRALINE HCL 100 MG PO TABS: 150.0000 mg | ORAL_TABLET | Freq: Every day | ORAL | 5 refills | Status: DC

## 2019-07-06 MED ORDER — BLOOD GLUCOSE METER KIT: PACK | 1 refills | Status: DC

## 2019-07-06 MED ORDER — AMLODIPINE BESYLATE 10 MG PO TABS: 10.0000 mg | ORAL_TABLET | Freq: Every day | ORAL | 5 refills | Status: DC

## 2019-07-06 MED ORDER — INSULIN ASPART 100 UNIT/ML ~~LOC~~ SOLN
0.0000 [IU] | Freq: Three times a day (TID) | SUBCUTANEOUS | Status: DC
  Administered 2019-07-06: 5 [IU] via SUBCUTANEOUS

## 2019-07-06 MED ORDER — "PEN NEEDLES 3/16"" 31G X 5 MM MISC": 1.0000 [IU] | Freq: Every day | 5 refills | Status: DC

## 2019-07-06 MED ORDER — GUAIFENESIN 100 MG/5ML PO LIQD: 200.0000 mg | Freq: Three times a day (TID) | ORAL | 0 refills | Status: DC | PRN

## 2019-07-06 MED ORDER — GLIMEPIRIDE 2 MG PO TABS: 2.0000 mg | ORAL_TABLET | Freq: Two times a day (BID) | ORAL | 5 refills | Status: DC

## 2019-07-06 MED ORDER — CHLORHEXIDINE GLUCONATE CLOTH 2 % EX PADS: 6.0000 | MEDICATED_PAD | Freq: Every day | CUTANEOUS | Status: DC

## 2019-07-06 MED ORDER — METFORMIN HCL ER 750 MG PO TB24
750.0000 mg | ORAL_TABLET | Freq: Once | ORAL | Status: AC
  Administered 2019-07-06: 750 mg via ORAL
  Filled 2019-07-06: qty 1

## 2019-07-06 MED ORDER — LURASIDONE HCL 20 MG PO TABS: 20.0000 mg | ORAL_TABLET | Freq: Every day | ORAL | 3 refills | Status: DC

## 2019-07-06 MED ORDER — INSULIN GLARGINE 100 UNIT/ML SOLOSTAR PEN: 10.0000 [IU] | PEN_INJECTOR | Freq: Every day | SUBCUTANEOUS | 11 refills | Status: DC

## 2019-07-06 MED ORDER — POTASSIUM CHLORIDE CRYS ER 20 MEQ PO TBCR
40.0000 meq | EXTENDED_RELEASE_TABLET | Freq: Once | ORAL | Status: AC
  Administered 2019-07-06: 40 meq via ORAL
  Filled 2019-07-06: qty 2

## 2019-07-06 MED ORDER — ACETAMINOPHEN 325 MG PO TABS: 650.0000 mg | ORAL_TABLET | Freq: Four times a day (QID) | ORAL | 3 refills | Status: DC | PRN

## 2019-07-06 MED ORDER — CLOPIDOGREL BISULFATE 75 MG PO TABS: 75.0000 mg | ORAL_TABLET | Freq: Every day | ORAL | 5 refills | Status: DC

## 2019-07-06 MED ORDER — METFORMIN HCL 1000 MG PO TABS: 1000.0000 mg | ORAL_TABLET | Freq: Two times a day (BID) | ORAL | 11 refills | Status: DC

## 2019-07-06 MED ORDER — ASPIRIN EC 81 MG PO TBEC: 81.0000 mg | DELAYED_RELEASE_TABLET | Freq: Every day | ORAL | 3 refills | Status: AC

## 2019-07-06 MED ORDER — LISINOPRIL 20 MG PO TABS: 20.0000 mg | ORAL_TABLET | Freq: Every day | ORAL | 3 refills | Status: DC

## 2019-07-06 MED ORDER — INSULIN ASPART 100 UNIT/ML ~~LOC~~ SOLN: 0.0000 [IU] | Freq: Every day | SUBCUTANEOUS | Status: DC

## 2019-07-06 NOTE — Progress Notes (Signed)
Inpatient Diabetes Program Recommendations  AACE/ADA: New Consensus Statement on Inpatient Glycemic Control (2015)  Target Ranges:  Prepandial:   less than 140 mg/dL      Peak postprandial:   less than 180 mg/dL (1-2 hours)      Critically ill patients:  140 - 180 mg/dL   Lab Results  Component Value Date   GLUCAP 347 (H) 07/06/2019   HGBA1C 12.4 (H) 07/05/2019    Review of Glycemic Control  Results for Adam Perez, Adam Perez (MRN [id number]) as of 07/06/2019 15:03  Ref. Range 07/06/2019 06:49 07/06/2019 08:27 07/06/2019 10:28 07/06/2019 12:02 07/06/2019 14:01  Glucose-Capillary Latest Ref Range: 70 - 99 mg/dL 164 (H) 217 (H) 280 (H) 276 (H) 347 (H)    Diabetes history: New DM2 Outpatient Diabetes medications: None   Note:  Spoke with patient via phone today.  Referral received for new DM and new to insulin.  He is current with the [place] in [place].  Reviewed patient's current A1c of 12.4% (average BS of 38m/dl). Explained what a A1c is and what it measures. Also reviewed goal A1c with patient, importance of good glucose control @ home, and blood sugar goals.  Spoke with pt about new diagnosis. Discussed basic pathophysiology of DM Type 2, basic home care, basic diabetes diet nutrition principles, importance of checking CBGs and maintaining good CBG control to prevent long-term and short-term complications. Reviewed signs and symptoms of hyperglycemia and hypoglycemia and how to treat hypoglycemia at home. Also reviewed blood sugar goals at home.  RNs to provide ongoing basic DM education at bedside with this patient. Have ordered educational booklet, insulin starter kit, and DM videos.  RN's have done a great job teaching insulin pen administration.  He has the LWWD and insulin starter kit.  Explained that he needs to check his blood sugar AC and HS.  He is going to follow upi with PCP in 1 week.  Asked patient to bring meter to PCP office visit.  Asked him to call PCP if he has frequent blood  sugars >2044mdl or <100 mg/dl.    Thank you, JeReche DixonRN, BSN Diabetes Coordinator Inpatient Diabetes Program [phone] pager from [phone]-5p)

## 2019-07-06 NOTE — Discharge Summary (Addendum)
Adam Perez, is a 64 y.o. male  DOB Sep 22, 1955  MRN [id number].  Admission date:  07/05/2019  Admitting Physician  Rhetta Mura, DO  Discharge Date:  07/06/2019   Primary [place]  Recommendations for primary care physician for things to follow:   1)You are taking Plavix/clopidigrel and aspirin which are blood Thinners so Avoid ibuprofen/Advil/Aleve/Motrin/Goody Powders/Naproxen/BC powders/Meloxicam/Diclofenac/Indomethacin and other Nonsteroidal anti-inflammatory medications as these will make you more likely to bleed and can cause stomach ulcers, can also cause Kidney problems.   2)Take Lantus insulin injection under the skin 10 units every bedtime  3)Take Amaryl/glimepiride 2 mg tablet with breakfast and with supper (twice daily)  4)Take Metformin 1000 mg tablet twice daily  5) avoid sweets and very starchy foods----green vegetables are good for you  6) please follow-[facility] with your [facility] within 1 to 2 weeks for recheck and reevaluation  7) you have type 2 diabetes--your hemoglobin A1c is 12.4 which means your average sugar over the last 3 months as being around 310--- which is pretty high  Admission Diagnosis  DKA (diabetic ketoacidoses) ([place]) [E11.10] Diabetic ketoacidosis without coma associated with other specified diabetes mellitus ([place]) [E13.10]   Discharge Diagnosis  DKA (diabetic ketoacidoses) ([place]) [E11.10] Diabetic ketoacidosis without coma associated with other specified diabetes mellitus ([place]) [E13.10]    Principal Problem:   DKA (diabetic ketoacidoses) (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 Hemiparesis ([place])    left side; residual from prior acute ischemic CVA  . HTN  (hypertension)   . Ischemic cerebrovascular accident (CVA) ([place])    with residual left hemiparesis   History reviewed. No pertinent surgical history.    HPI  from the history and physical done on the day of admission:   Chief Complaint: increased thirst and increased urination  HPI: Adam Perez is a 64 y.o. 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   The patient reports 7 to 10 days of increased thirst as well as increased urination in spite of no change in his overall intake of food or fluid over that time.  Denies any associated nausea, vomiting, diarrhea, or abdominal discomfort.  He denies any personal history of underlying diabetes.  He reports that he receives the majority of his medical care through the [place], and that he most recently followed with his associated PCP approximately 6 months ago, at which time he does not believe that he was told of any elevated blood sugar.  He confirms that he has never previously been on insulin or any oral hypoglycemic agents.  Reports a  family history of diabetes in 2 of his brothers, both of which have passed away in the setting of "diabetic coma", according to the patient.  He denies any recent subjective fever, chills, rigors, or generalized myalgias. Denies any recent headache, neck stiffness, rhinitis, rhinorrhea, sore throat, sob, cough, or rash. No recent traveling or known COVID-19 exposures. Denies dysuria or gross hematuria.  Denies any recent chest pain, diaphoresis, palpitations, dizziness, pre-syncope, syncope, or vertigo.   Past medical history notable for history of ischemic CVA with residual left-sided spastic hemiparesis.   The patient reports that he is on a daily baby aspirin as a sole outpatient antiplatelet medication, and denies any formal anti-coagulation at home. He specifically denies taking Plavix as an outpatient. Denies any alcohol consumption, reports that he is a lifelong non-smoker, denies any use of recreational drugs.    ED Course:  Vital signs in the ED were notable for the following: Temperature max 97.8; heart rate 89; blood pressure 147/92; respiratory rate 18; oxygen saturation 99% on room air.  Labs were notable for the following: Point-of-care glucose of 518; BMP notable for sodium 141, potassium 4.2, bicarbonate 21, anion gap 16, creatinine 1.35.  Beta hydroxybutyric acid 3.69.  Urinalysis notable for no white blood cells, no bacteria, nitrate negative, leukocyte esterase negative, no RBCs, but showed 500 glucose and 20 ketones.  CBC notable for white blood cell count of 6800.  Nasopharyngeal COVID-19 PCR pain in the ED today, with result currently pending.  While in the ED, the following were administered: IV normal saline at 75 cc/h; potassium chloride 20 mEq IV over 2 hours x 1, and insulin drip was initiated.  Subsequently, the patient was admitted to the stepdown unit for further evaluation and management of suspected diabetic ketoacidosis in the context of a new diagnosis of diabetes.       Hospital Course:     1)New onset diabetes with DKA-----patient met DKA criteria on admission--glucose was 518, anion gap of 16 and bicarb was as low as 19 -With IV insulin and IV fluids glucose came down to 140 at 4 AM, with anion gap of 9 and bicarb of 23 -Patient was transitioned off IV insulin -A1c is 12.4 with an average glucose of about 310 over the last 3 months or so -Patient has left-sided weakness from prior stroke making it difficult for him to self administer insulin -Patient was taught how to administer insulin pen with redemonstration with the right hand --Will limit frequency  of insulin administration due to left hemiparesis and difficulty with administering insulin with unilateral strong side----give Lantus insulin 10 units nightly -Metformin 1 g twice daily -Amaryl 2 g twice daily with meals  2)H/o Prior CVA-with residual left-sided hemiparesis--[place], Plavix and Lipitor  3)HTN--- stable, continue lisinopril and amlodipine  4)Depression--- we will, continue Zoloft and Latuda  Discharge Condition: stable  Follow UP  [place]. Schedule an appointment as soon as possible for a visit in 1 week(s).   Specialty: General Practice Why: Diabetic checkup Contact information: [address] [place] [id number] [phone]           Consults obtained -   Diet and Activity recommendation:  As advised  Discharge Instructions    Discharge Instructions    Call MD for:  difficulty breathing, headache or visual disturbances   Complete by: As directed    Call MD for:  persistant dizziness or light-headedness   Complete by: As directed  Call MD for:  persistant nausea and vomiting   Complete by: As directed    Call MD for:  severe uncontrolled pain   Complete by: As directed    Call MD for:  temperature >100.4   Complete by: As directed    Diet - low sodium heart healthy   Complete by: As directed    Diet Carb Modified   Complete by: As directed    Discharge instructions   Complete by: As directed    1)You are taking Plavix/clopidigrel and aspirin which are blood Thinners so Avoid ibuprofen/Advil/Aleve/Motrin/Goody Powders/Naproxen/BC powders/Meloxicam/Diclofenac/Indomethacin and other Nonsteroidal anti-inflammatory medications as these will make you more likely to bleed and can cause stomach ulcers, can also cause Kidney problems.   2)Take Lantus insulin injection under the skin 10 units every bedtime  3)Take Amaryl/glimepiride 2 mg tablet with breakfast and with supper (twice daily)  4)Take Metformin  1000 mg tablet twice daily  5) avoid sweets and very starchy foods----green vegetables are good for you  6) please follow-up with your [facility] doctor within 1 to 2 weeks for recheck and reevaluation  7) you have type 2 diabetes--your hemoglobin A1c is 12.4 which means your average sugar over the last 3 months as being around 310--- which is pretty high   Increase activity slowly   Complete by: As directed         Discharge Medications     Allergies as of 07/06/2019   No Known Allergies     Medication List    TAKE these medications   acetaminophen 325 MG tablet Commonly known as: TYLENOL Take 2 tablets (650 mg total) by mouth every 6 (six) hours as needed for mild pain (or Fever >/= 101).   amLODipine 10 MG tablet Commonly known as: NORVASC Take 1 tablet (10 mg total) by mouth daily.   aspirin EC 81 MG tablet Take 1 tablet (81 mg total) by mouth daily with breakfast. What changed: when to take this   atorvastatin 40 MG tablet Commonly known as: LIPITOR Take 1 tablet (40 mg total) by mouth daily.   baclofen 20 MG tablet Commonly known as: LIORESAL Take 1 tablet (20 mg total) by mouth 3 (three) times daily.   blood glucose meter kit and supplies Relion Prime or Dispense other brand based on patient and insurance preference. Use up to four times daily as directed. (FOR ICD-9 250.00, 250.01).   blood glucose meter kit and supplies Dispense based on patient and insurance preference. Use up to four times daily as directed. (FOR ICD-9 250.00, 250.01).   clopidogrel 75 MG tablet Commonly known as: PLAVIX Take 1 tablet (75 mg total) by mouth daily.   glimepiride 2 MG tablet Commonly known as: Amaryl Take 1 tablet (2 mg total) by mouth 2 (two) times daily with a meal.   guaiFENesin 100 MG/5ML liquid Commonly known as: ROBITUSSIN Take 10 mLs (200 mg total) by mouth 3 (three) times daily as needed for cough.   insulin glargine 100 UNIT/ML Solostar Pen Commonly known as:  LANTUS Inject 10 Units into the skin at bedtime.   lisinopril 20 MG tablet Commonly known as: ZESTRIL Take 1 tablet (20 mg total) by mouth daily. What changed:   medication strength  how much to take   lurasidone 20 MG Tabs tablet Commonly known as: LATUDA Take 1 tablet (20 mg total) by mouth daily.   metFORMIN 1000 MG tablet Commonly known as: Glucophage Take 1 tablet (1,000 mg total) by mouth 2 (two)  times daily with a meal.   Pen Needles 3/16" 31G X 5 MM Misc 1 Units by Does not apply route at bedtime. Use it to administer insulin   sertraline 100 MG tablet Commonly known as: ZOLOFT Take 1.5 tablets (150 mg total) by mouth daily.       Major procedures and Radiology Reports - PLEASE review detailed and final reports for all details, in brief -   DG CHEST PORT 1 VIEW  Result Date: 07/05/2019 CLINICAL DATA:  Diabetic ketoacidosis. EXAM: PORTABLE CHEST 1 VIEW COMPARISON:  Radiograph 03/07/2011 FINDINGS: Bilateral EKG leads overlie the chest.The cardiomediastinal contours are normal. Aortic atherosclerosis. The lungs are clear. Pulmonary vasculature is normal. No consolidation, pleural effusion, or pneumothorax. No acute osseous abnormalities are seen. IMPRESSION: No acute chest findings. Aortic Atherosclerosis (ICD10-I70.0). Electronically Signed   By: Keith Rake M.D.   On: 07/05/2019 18:29    Micro Results   Recent Results (from the past 240 hour(s))  Respiratory Panel by RT PCR (Flu A&B, Covid) - Nasopharyngeal Swab     Status: None   Collection Time: 07/05/19  3:38 PM   Specimen: Nasopharyngeal Swab  Result Value Ref Range Status   SARS Coronavirus 2 by RT PCR NEGATIVE NEGATIVE Final    Comment: (NOTE) SARS-CoV-2 target nucleic acids are NOT DETECTED. The SARS-CoV-2 RNA is generally detectable in upper respiratoy specimens during the acute phase of infection. The lowest concentration of SARS-CoV-2 viral copies this assay can detect is 131 copies/mL. A  negative result does not preclude SARS-Cov-2 infection and should not be used as the sole basis for treatment or other patient management decisions. A negative result may occur with  improper specimen collection/handling, submission of specimen other than nasopharyngeal swab, presence of viral mutation(s) within the areas targeted by this assay, and inadequate number of viral copies (<131 copies/mL). A negative result must be combined with clinical observations, patient history, and epidemiological information. The expected result is Negative. Fact Sheet for Patients:  PinkCheek.be Fact Sheet for Healthcare Providers:  GravelBags.it This test is not yet ap proved or cleared by the [place] FDA and  has been authorized for detection and/or diagnosis of SARS-CoV-2 by FDA under an Emergency Use Authorization (EUA). This EUA will remain  in effect (meaning this test can be used) for the duration of the COVID-19 declaration under Section 564(b)(1) of the Act, 21 U.S.C. section 360bbb-3(b)(1), unless the authorization is terminated or revoked sooner.    Influenza A by PCR NEGATIVE NEGATIVE Final   Influenza B by PCR NEGATIVE NEGATIVE Final    Comment: (NOTE) The Xpert Xpress SARS-CoV-2/FLU/RSV assay is intended as an aid in  the diagnosis of influenza from Nasopharyngeal swab specimens and  should not be used as a sole basis for treatment. Nasal washings and  aspirates are unacceptable for Xpert Xpress SARS-CoV-2/FLU/RSV  testing. Fact Sheet for Patients: PinkCheek.be Fact Sheet for Healthcare Providers: GravelBags.it This test is not yet approved or cleared by the [place] FDA and  has been authorized for detection and/or diagnosis of SARS-CoV-2 by  FDA under an Emergency Use Authorization (EUA). This EUA will remain  in effect (meaning this test can be used) for  the duration of the  Covid-19 declaration under Section 564(b)(1) of the Act, 21  U.S.C. section 360bbb-3(b)(1), unless the authorization is  terminated or revoked. Performed at [facility], [address], [place], [place] [address]   MRSA PCR Screening     Status: None   Collection Time:  07/05/19  6:45 PM   Specimen: Nasal Mucosa; Nasopharyngeal  Result Value Ref Range Status   MRSA by PCR NEGATIVE NEGATIVE Final    Comment:        The GeneXpert MRSA Assay (FDA approved for NASAL specimens only), is one component of a comprehensive MRSA colonization surveillance program. It is not intended to diagnose MRSA infection nor to guide or monitor treatment for MRSA infections. Performed at [facility], [address] [place], [place] [address]        Today   Subjective    Adam Perez today has no new complaints No fever  Or chills   No Nausea, Vomiting or Diarrhea        Patient has been seen and examined prior to discharge   Objective   Blood pressure (!) 145/103, pulse 82, temperature 97.8 F (36.6 C), temperature source Oral, resp. rate 16, height _0  (1.702 m), weight 108.6 kg, SpO2 100 %.   Intake/Output Summary (Last 24 hours) at 07/06/2019 1436 Last data filed at 07/06/2019 0435 Gross per 24 hour  Intake 1351.93 ml  Output --  Net 1351.93 ml    Exam Gen:- Awake Alert, no acute distress  HEENT:- [place].AT, No sclera icterus Neck-Supple Neck,No JVD,.  Lungs-  CTAB , good air movement bilaterally  CV- S1, S2 normal, regular Abd-  +ve B.Sounds, Abd Soft, No tenderness,    Extremity/Skin:- No  edema,   good pulses Psych-affect is appropriate, oriented x3 Neuro-residual left-sided hemiparesis, no new focal deficits, no tremors    Data Review   CBC w Diff:  Lab Results  Component Value Date   WBC 7.0 07/06/2019   HGB 11.3 (L) 07/06/2019   HCT 34.7 (L) 07/06/2019   PLT 225 07/06/2019   CMP:  Lab Results  Component Value Date   NA 146 (H)  07/06/2019   NA 146 (H) 07/06/2019   K 3.5 07/06/2019   K 3.4 (L) 07/06/2019   CL 114 (H) 07/06/2019   CL 114 (H) 07/06/2019   CO2 20 (L) 07/06/2019   CO2 23 07/06/2019   BUN 17 07/06/2019   BUN 17 07/06/2019   CREATININE 1.04 07/06/2019   CREATININE 1.03 07/06/2019   PROT 6.7 07/06/2019   ALBUMIN 3.7 07/06/2019   BILITOT 0.8 07/06/2019   ALKPHOS 80 07/06/2019   AST 17 07/06/2019   ALT 21 07/06/2019  .  Total Discharge time is about 33 minutes  Roxan Hockey M.D on 07/06/2019 at 2:36 PM  Go to [email or web address] -  for contact info  [facility] - Office  [phone]

## 2019-07-06 NOTE — Discharge Instructions (Signed)
1)You are taking Plavix/clopidigrel and aspirin which are blood Thinners so Avoid ibuprofen/Advil/Aleve/Motrin/Goody Powders/Naproxen/BC powders/Meloxicam/Diclofenac/Indomethacin and other Nonsteroidal anti-inflammatory medications as these will make you more likely to bleed and can cause stomach ulcers, can also cause Kidney problems.   2)Take Lantus insulin injection under the skin 10 units every bedtime  3)Take Amaryl/glimepiride 2 mg tablet with breakfast and with supper (twice daily)  4)Take Metformin 1000 mg tablet twice daily  5) avoid sweets and very starchy foods----green vegetables are good for you  6) please follow-up with your [facility] doctor within 1 to 2 weeks for recheck and reevaluation  7) you have type 2 diabetes--your hemoglobin A1c is 12.4 which means your average sugar over the last 3 months as being around 310--- which is pretty high

## 2019-07-06 NOTE — Care Plan (Signed)
Extensive education provided on the importance of checking blood sugar levels regularly. Pt was able to properly demonstrate how to use a glucometer and insulin pen. Education on diet and medication completed.

## 2019-07-07 LAB — ANTI-ISLET CELL ANTIBODY: Pancreatic Islet Cell Antibody: NEGATIVE

## 2019-07-13 ENCOUNTER — Ambulatory Visit: Payer: Medicare Other | Attending: Internal Medicine

## 2019-07-13 DIAGNOSIS — Z23 Encounter for immunization: Secondary | ICD-10-CM

## 2019-07-13 NOTE — Progress Notes (Signed)
   Covid-19 Vaccination Clinic  Name:  HELIX LAFONTAINE    MRN: [id number] DOB: 07/05/1955  07/13/2019  Mr. Offord was observed post Covid-19 immunization for 15 minutes without incident. He was provided with Vaccine Information Sheet and instruction to access the V-Safe system.   Mr. Zaldivar was instructed to call 911 with any severe reactions post vaccine: Marland Kitchen Difficulty breathing  . Swelling of face and throat  . A fast heartbeat  . A bad rash all over body  . Dizziness and weakness   Immunizations Administered    Name Date Dose VIS Date Route   Moderna COVID-19 Vaccine 07/13/2019 10:00 AM 0.5 mL 02/2019 Intramuscular   Manufacturer: Moderna   Lot: [id number]   NDC: 03754-360-67

## 2020-03-11 DIAGNOSIS — U071 COVID-19: Secondary | ICD-10-CM

## 2020-03-11 HISTORY — DX: COVID-19: U07.1

## 2021-02-14 ENCOUNTER — Other Ambulatory Visit: Payer: Self-pay

## 2021-02-14 ENCOUNTER — Emergency Department (HOSPITAL_COMMUNITY): Payer: Medicare Other

## 2021-02-14 ENCOUNTER — Inpatient Hospital Stay (HOSPITAL_COMMUNITY): Payer: Medicare Other

## 2021-02-14 ENCOUNTER — Encounter (HOSPITAL_COMMUNITY): Payer: Self-pay | Admitting: *Deleted

## 2021-02-14 ENCOUNTER — Inpatient Hospital Stay (HOSPITAL_COMMUNITY)
Admission: EM | Admit: 2021-02-14 | Discharge: 2021-02-23 | DRG: 240 | Disposition: A | Discharge status: Rehabilitation Facility | Payer: Medicare Other | Attending: Internal Medicine | Admitting: Internal Medicine

## 2021-02-14 DIAGNOSIS — T148XXA Other injury of unspecified body region, initial encounter: Secondary | ICD-10-CM | POA: Diagnosis present

## 2021-02-14 DIAGNOSIS — E1151 Type 2 diabetes mellitus with diabetic peripheral angiopathy without gangrene: Principal | ICD-10-CM | POA: Diagnosis present

## 2021-02-14 DIAGNOSIS — I872 Venous insufficiency (chronic) (peripheral): Secondary | ICD-10-CM | POA: Diagnosis present

## 2021-02-14 DIAGNOSIS — E11628 Type 2 diabetes mellitus with other skin complications: Secondary | ICD-10-CM | POA: Diagnosis present

## 2021-02-14 DIAGNOSIS — I83028 Varicose veins of left lower extremity with ulcer other part of lower leg: Secondary | ICD-10-CM | POA: Diagnosis present

## 2021-02-14 DIAGNOSIS — I6930 Unspecified sequelae of cerebral infarction: Secondary | ICD-10-CM | POA: Diagnosis not present

## 2021-02-14 DIAGNOSIS — E860 Dehydration: Secondary | ICD-10-CM | POA: Diagnosis present

## 2021-02-14 DIAGNOSIS — L97529 Non-pressure chronic ulcer of other part of left foot with unspecified severity: Secondary | ICD-10-CM | POA: Diagnosis not present

## 2021-02-14 DIAGNOSIS — Z833 Family history of diabetes mellitus: Secondary | ICD-10-CM | POA: Diagnosis not present

## 2021-02-14 DIAGNOSIS — E118 Type 2 diabetes mellitus with unspecified complications: Secondary | ICD-10-CM | POA: Diagnosis not present

## 2021-02-14 DIAGNOSIS — L97509 Non-pressure chronic ulcer of other part of unspecified foot with unspecified severity: Secondary | ICD-10-CM | POA: Diagnosis not present

## 2021-02-14 DIAGNOSIS — K5901 Slow transit constipation: Secondary | ICD-10-CM | POA: Diagnosis not present

## 2021-02-14 DIAGNOSIS — L97829 Non-pressure chronic ulcer of other part of left lower leg with unspecified severity: Secondary | ICD-10-CM | POA: Diagnosis present

## 2021-02-14 DIAGNOSIS — I10 Essential (primary) hypertension: Secondary | ICD-10-CM | POA: Diagnosis present

## 2021-02-14 DIAGNOSIS — E1165 Type 2 diabetes mellitus with hyperglycemia: Secondary | ICD-10-CM | POA: Diagnosis present

## 2021-02-14 DIAGNOSIS — I70245 Atherosclerosis of native arteries of left leg with ulceration of other part of foot: Secondary | ICD-10-CM | POA: Diagnosis not present

## 2021-02-14 DIAGNOSIS — F411 Generalized anxiety disorder: Secondary | ICD-10-CM | POA: Diagnosis present

## 2021-02-14 DIAGNOSIS — Z7902 Long term (current) use of antithrombotics/antiplatelets: Secondary | ICD-10-CM | POA: Diagnosis not present

## 2021-02-14 DIAGNOSIS — M86672 Other chronic osteomyelitis, left ankle and foot: Secondary | ICD-10-CM | POA: Diagnosis present

## 2021-02-14 DIAGNOSIS — Z79899 Other long term (current) drug therapy: Secondary | ICD-10-CM

## 2021-02-14 DIAGNOSIS — E11621 Type 2 diabetes mellitus with foot ulcer: Secondary | ICD-10-CM | POA: Diagnosis not present

## 2021-02-14 DIAGNOSIS — Z7984 Long term (current) use of oral hypoglycemic drugs: Secondary | ICD-10-CM

## 2021-02-14 DIAGNOSIS — M25552 Pain in left hip: Secondary | ICD-10-CM | POA: Diagnosis present

## 2021-02-14 DIAGNOSIS — L03116 Cellulitis of left lower limb: Secondary | ICD-10-CM | POA: Diagnosis present

## 2021-02-14 DIAGNOSIS — I70242 Atherosclerosis of native arteries of left leg with ulceration of calf: Secondary | ICD-10-CM | POA: Diagnosis not present

## 2021-02-14 DIAGNOSIS — D638 Anemia in other chronic diseases classified elsewhere: Secondary | ICD-10-CM | POA: Diagnosis present

## 2021-02-14 DIAGNOSIS — G8112 Spastic hemiplegia affecting left dominant side: Secondary | ICD-10-CM | POA: Diagnosis not present

## 2021-02-14 DIAGNOSIS — Z6837 Body mass index (BMI) 37.0-37.9, adult: Secondary | ICD-10-CM | POA: Diagnosis not present

## 2021-02-14 DIAGNOSIS — F32A Depression, unspecified: Secondary | ICD-10-CM | POA: Diagnosis present

## 2021-02-14 DIAGNOSIS — E1169 Type 2 diabetes mellitus with other specified complication: Secondary | ICD-10-CM | POA: Diagnosis present

## 2021-02-14 DIAGNOSIS — M1612 Unilateral primary osteoarthritis, left hip: Secondary | ICD-10-CM | POA: Diagnosis present

## 2021-02-14 DIAGNOSIS — I87332 Chronic venous hypertension (idiopathic) with ulcer and inflammation of left lower extremity: Secondary | ICD-10-CM

## 2021-02-14 DIAGNOSIS — L089 Local infection of the skin and subcutaneous tissue, unspecified: Secondary | ICD-10-CM | POA: Diagnosis not present

## 2021-02-14 DIAGNOSIS — I69354 Hemiplegia and hemiparesis following cerebral infarction affecting left non-dominant side: Secondary | ICD-10-CM | POA: Diagnosis not present

## 2021-02-14 DIAGNOSIS — R55 Syncope and collapse: Secondary | ICD-10-CM | POA: Diagnosis not present

## 2021-02-14 DIAGNOSIS — D62 Acute posthemorrhagic anemia: Secondary | ICD-10-CM | POA: Diagnosis present

## 2021-02-14 DIAGNOSIS — E669 Obesity, unspecified: Secondary | ICD-10-CM | POA: Diagnosis not present

## 2021-02-14 DIAGNOSIS — L97929 Non-pressure chronic ulcer of unspecified part of left lower leg with unspecified severity: Secondary | ICD-10-CM | POA: Diagnosis not present

## 2021-02-14 DIAGNOSIS — IMO0002 Reserved for concepts with insufficient information to code with codable children: Secondary | ICD-10-CM | POA: Diagnosis present

## 2021-02-14 DIAGNOSIS — I743 Embolism and thrombosis of arteries of the lower extremities: Secondary | ICD-10-CM | POA: Diagnosis not present

## 2021-02-14 DIAGNOSIS — Z20822 Contact with and (suspected) exposure to covid-19: Secondary | ICD-10-CM | POA: Diagnosis present

## 2021-02-14 DIAGNOSIS — R7989 Other specified abnormal findings of blood chemistry: Secondary | ICD-10-CM | POA: Diagnosis not present

## 2021-02-14 DIAGNOSIS — Z419 Encounter for procedure for purposes other than remedying health state, unspecified: Secondary | ICD-10-CM

## 2021-02-14 DIAGNOSIS — I70248 Atherosclerosis of native arteries of left leg with ulceration of other part of lower left leg: Secondary | ICD-10-CM | POA: Diagnosis present

## 2021-02-14 DIAGNOSIS — E785 Hyperlipidemia, unspecified: Secondary | ICD-10-CM | POA: Diagnosis present

## 2021-02-14 DIAGNOSIS — Z794 Long term (current) use of insulin: Secondary | ICD-10-CM

## 2021-02-14 DIAGNOSIS — K5900 Constipation, unspecified: Secondary | ICD-10-CM | POA: Diagnosis present

## 2021-02-14 DIAGNOSIS — F4310 Post-traumatic stress disorder, unspecified: Secondary | ICD-10-CM | POA: Diagnosis present

## 2021-02-14 DIAGNOSIS — I96 Gangrene, not elsewhere classified: Secondary | ICD-10-CM | POA: Diagnosis not present

## 2021-02-14 DIAGNOSIS — Z7982 Long term (current) use of aspirin: Secondary | ICD-10-CM | POA: Diagnosis not present

## 2021-02-14 DIAGNOSIS — Z89432 Acquired absence of left foot: Secondary | ICD-10-CM | POA: Diagnosis not present

## 2021-02-14 DIAGNOSIS — G8918 Other acute postprocedural pain: Secondary | ICD-10-CM | POA: Diagnosis not present

## 2021-02-14 DIAGNOSIS — I739 Peripheral vascular disease, unspecified: Secondary | ICD-10-CM

## 2021-02-14 DIAGNOSIS — Z4781 Encounter for orthopedic aftercare following surgical amputation: Secondary | ICD-10-CM | POA: Diagnosis present

## 2021-02-14 HISTORY — DX: Type 2 diabetes mellitus without complications: E11.9

## 2021-02-14 LAB — CBC WITH DIFFERENTIAL/PLATELET
Abs Immature Granulocytes: 0.04 10*3/uL (ref 0.00–0.07)
Basophils Absolute: 0.1 10*3/uL (ref 0.0–0.1)
Basophils Relative: 1 %
Eosinophils Absolute: 0 10*3/uL (ref 0.0–0.5)
Eosinophils Relative: 0 %
HCT: 33.3 % — ABNORMAL LOW (ref 39.0–52.0)
Hemoglobin: 10.7 g/dL — ABNORMAL LOW (ref 13.0–17.0)
Immature Granulocytes: 1 %
Lymphocytes Relative: 15 %
Lymphs Abs: 1.2 10*3/uL (ref 0.7–4.0)
MCH: 28.8 pg (ref 26.0–34.0)
MCHC: 32.1 g/dL (ref 30.0–36.0)
MCV: 89.5 fL (ref 80.0–100.0)
Monocytes Absolute: 0.5 10*3/uL (ref 0.1–1.0)
Monocytes Relative: 7 %
Neutro Abs: 6 10*3/uL (ref 1.7–7.7)
Neutrophils Relative %: 76 %
Platelets: 362 10*3/uL (ref 150–400)
RBC: 3.72 MIL/uL — ABNORMAL LOW (ref 4.22–5.81)
RDW: 16.7 % — ABNORMAL HIGH (ref 11.5–15.5)
WBC: 7.7 10*3/uL (ref 4.0–10.5)
nRBC: 0 % (ref 0.0–0.2)

## 2021-02-14 LAB — CBG MONITORING, ED: Glucose-Capillary: 96 mg/dL (ref 70–99)

## 2021-02-14 LAB — COMPREHENSIVE METABOLIC PANEL
ALT: 26 U/L (ref 0–44)
AST: 36 U/L (ref 15–41)
Albumin: 4 g/dL (ref 3.5–5.0)
Alkaline Phosphatase: 82 U/L (ref 38–126)
Anion gap: 9 (ref 5–15)
BUN: 26 mg/dL — ABNORMAL HIGH (ref 8–23)
CO2: 23 mmol/L (ref 22–32)
Calcium: 9.5 mg/dL (ref 8.9–10.3)
Chloride: 106 mmol/L (ref 98–111)
Creatinine, Ser: 1.31 mg/dL — ABNORMAL HIGH (ref 0.61–1.24)
GFR, Estimated: >60 mL/min (ref >60)
Glucose, Bld: 122 mg/dL — ABNORMAL HIGH (ref 70–99)
Potassium: 5.2 mmol/L — ABNORMAL HIGH (ref 3.5–5.1)
Sodium: 138 mmol/L (ref 135–145)
Total Bilirubin: 1.2 mg/dL (ref 0.3–1.2)
Total Protein: 9.1 g/dL — ABNORMAL HIGH (ref 6.5–8.1)

## 2021-02-14 LAB — HEMOGLOBIN A1C
Hgb A1c MFr Bld: 6.1 % — ABNORMAL HIGH (ref 4.8–5.6)
Mean Plasma Glucose: 128.37 mg/dL

## 2021-02-14 LAB — RESP PANEL BY RT-PCR (FLU A&B, COVID) ARPGX2
Influenza A by PCR: NEGATIVE
Influenza B by PCR: NEGATIVE
SARS Coronavirus 2 by RT PCR: NEGATIVE

## 2021-02-14 LAB — PREALBUMIN: Prealbumin: 27.4 mg/dL (ref 18–38)

## 2021-02-14 LAB — PROTIME-INR
INR: 1.1 (ref 0.8–1.2)
Prothrombin Time: 14.3 seconds (ref 11.4–15.2)

## 2021-02-14 LAB — LACTIC ACID, PLASMA
Lactic Acid, Venous: 2.5 mmol/L (ref 0.5–1.9)
Lactic Acid, Venous: 2.4 mmol/L (ref 0.5–1.9)

## 2021-02-14 LAB — GLUCOSE, CAPILLARY: Glucose-Capillary: 123 mg/dL — ABNORMAL HIGH (ref 70–99)

## 2021-02-14 IMAGING — MR MR FOOT*L* W/O CM
5 series · 40 of 40 positions shown · non-contrast
Comparison: Radiographs, same date.

CLINICAL DATA: Foot pain and swelling.

EXAM:
MRI OF THE LEFT FOOT WITHOUT CONTRAST
TECHNIQUE: Multiplanar, multisequence MR imaging of the left foot was
performed. No intravenous contrast was administered.

[Series 5: T1 · oblique · left · 3.0mm · 0.86mm/px · 4 of 20 slices shown (1 of 2)]
[im 1/20]
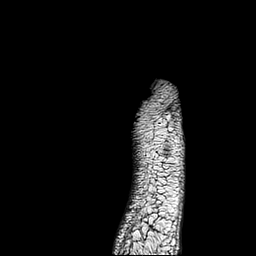
[im 7/20]
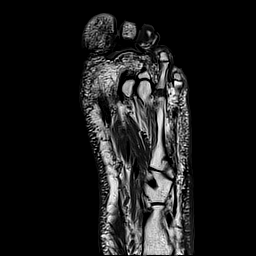
[im 13/20]
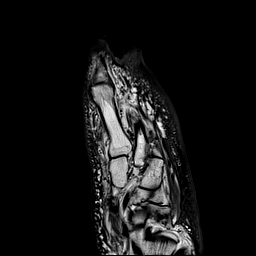
[im 20/20]
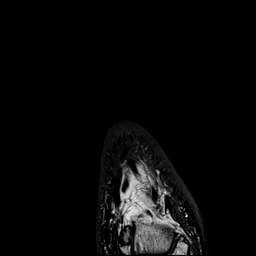

[Series 7: T2 fat-sat · oblique · left · 3.0mm · 0.86mm/px · 5 of 20 slices shown (1 of 2)]
[im 1/20]
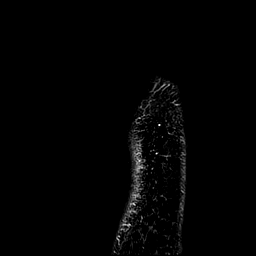
[im 5/20]
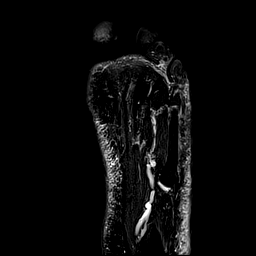
[im 10/20]
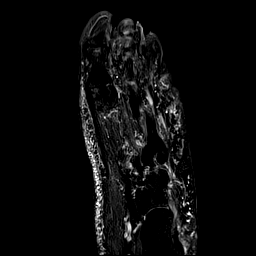
[im 15/20]
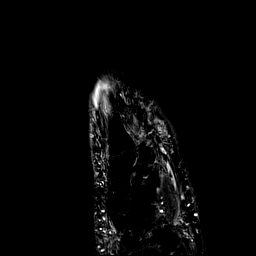
[im 20/20]
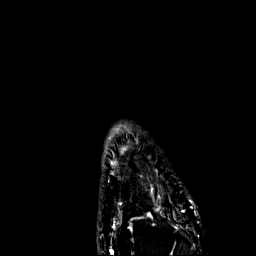

[Series 10: STIR · oblique · left · 3.0mm · 0.86mm/px · 7 of 29 slices shown]
[im 1/29]
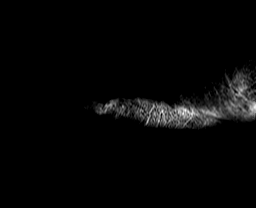
[im 5/29]
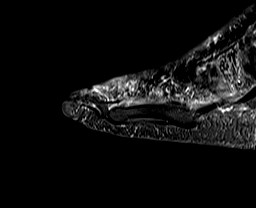
[im 10/29]
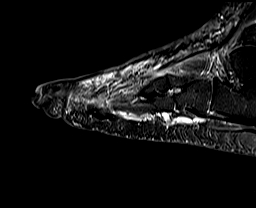
[im 15/29]
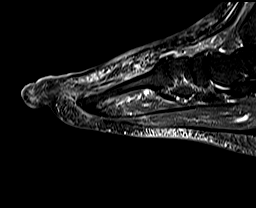
[im 19/29]
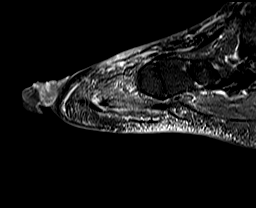
[im 24/29]
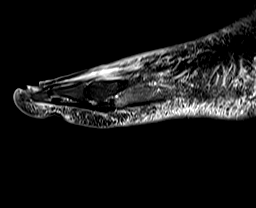
[im 29/29]
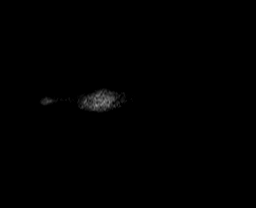

[Series 1025: T1 · oblique · left · 3.0mm · 0.41mm/px · 12 of 52 slices shown (2 of 2)]
[im 1/52]
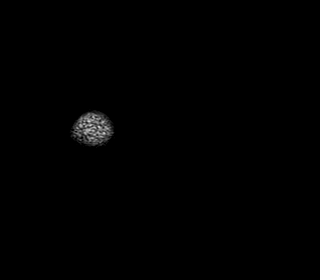
[im 5/52]
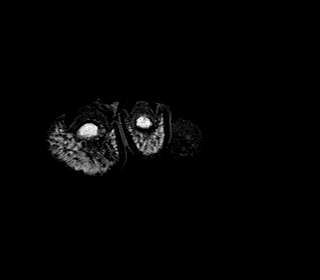
[im 10/52]
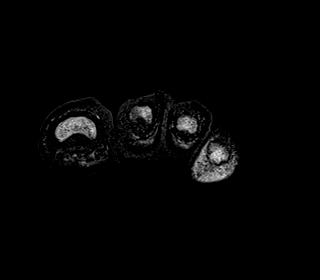
[im 14/52]
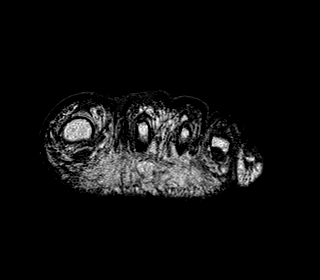
[im 19/52]
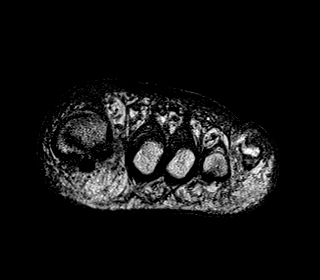
[im 24/52]
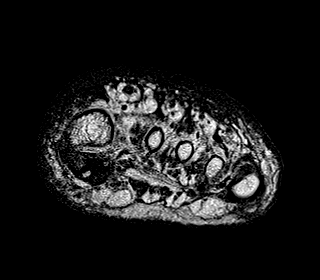
[im 28/52]
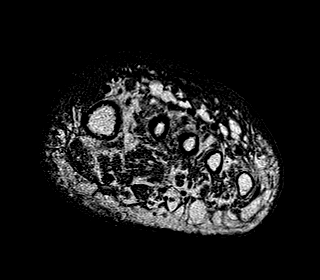
[im 33/52]
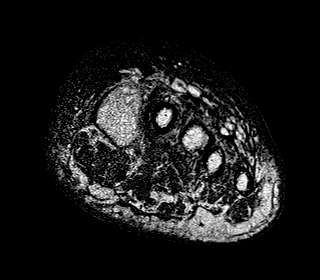
[im 38/52]
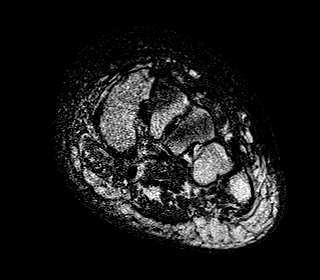
[im 42/52]
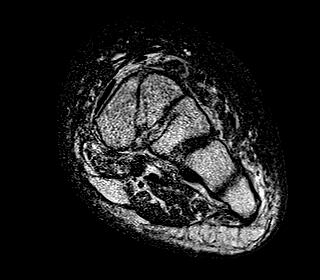
[im 47/52]
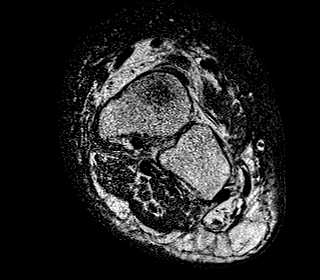
[im 52/52]
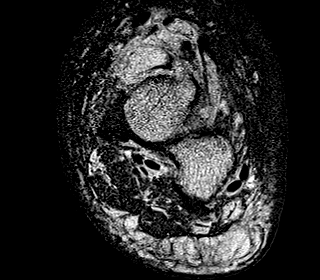

[Series 1029: T2 fat-sat · oblique · left · 3.0mm · 0.41mm/px · 12 of 52 slices shown (2 of 2)]
[im 1/52]
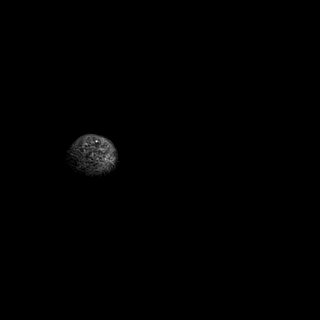
[im 5/52]
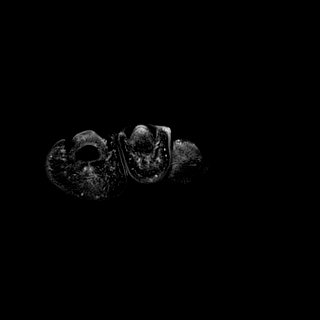
[im 10/52]
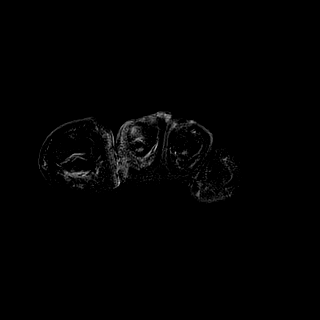
[im 14/52]
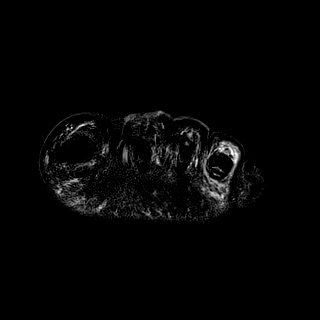
[im 19/52]
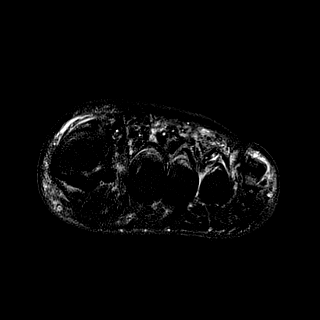
[im 24/52]
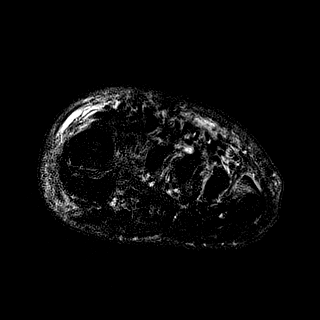
[im 28/52]
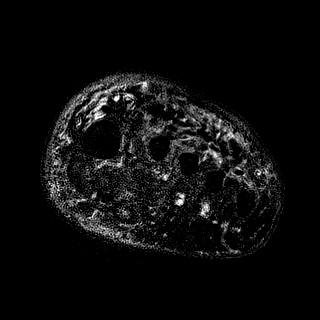
[im 33/52]
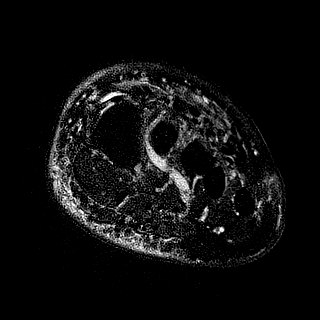
[im 38/52]
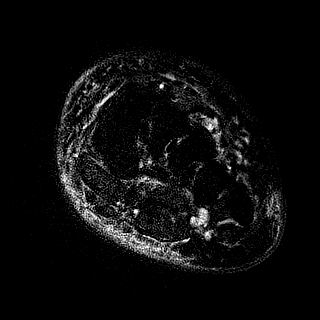
[im 42/52]
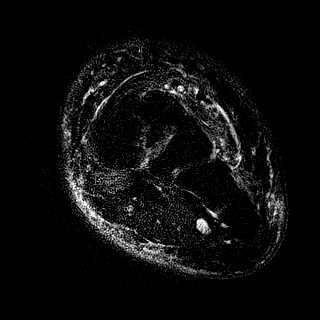
[im 47/52]
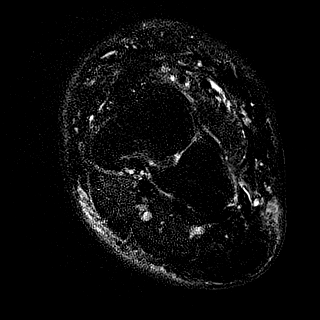
[im 52/52]
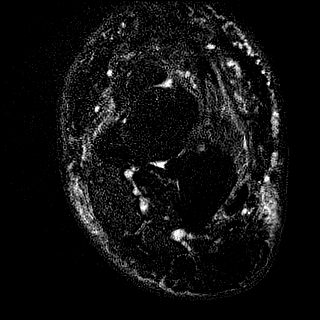

[40 of 40 positions shown; findings below may reference images not displayed]

FINDINGS: Marked subcutaneous soft tissue swelling/edema/fluid consistent with
cellulitis. Open wounds suspected involving the first, second and
third toes dorsally.

Abnormal T1 and T2 signal intensity involving the distal aspect of
the second proximal phalanx, middle phalanx and the distal phalanx
consistent with osteomyelitis. There is also abnormal signal
intensity in the distal phalanx of the third toe consistent with
osteomyelitis.

No discrete drainable soft tissue abscess is identified. There is
mild diffuse myositis but no findings suspicious for pyomyositis.
The metatarsal and tarsal bones are intact.
IMPRESSION: 1. Marked subcutaneous soft tissue swelling/edema/fluid consistent
with cellulitis.
2. Osteomyelitis involving the entire second toe and the distal
phalanx of the third toe.
3. No discrete drainable soft tissue abscess.
4. Mild diffuse myositis but no findings suspicious for pyomyositis.

## 2021-02-14 IMAGING — MR MR HIP*L* W/O CM
5 series · 40 of 40 positions shown · non-contrast
Comparison: Radiographs, same date.

CLINICAL DATA: Left hip pain.

EXAM:
MR OF THE LEFT HIP WITHOUT CONTRAST
TECHNIQUE: Multiplanar, multisequence MR imaging was performed. No intravenous
contrast was administered.

[Series 8: STIR · coronal · left · 4.0mm · 1.09mm/px · 10 of 34 slices shown]
[im 1/34]
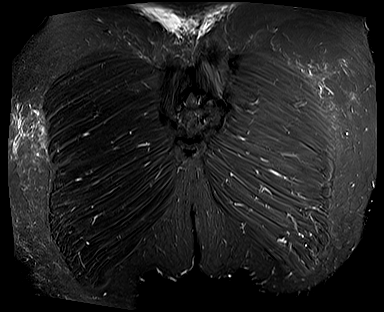
[im 4/34]
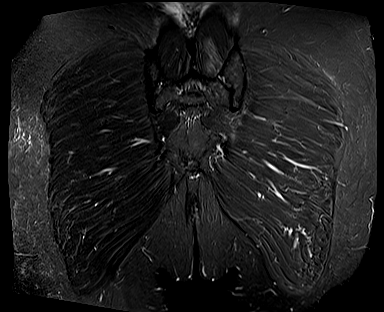
[im 8/34]
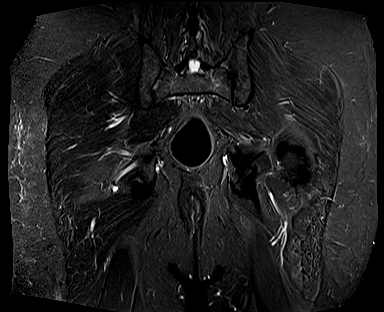
[im 12/34]
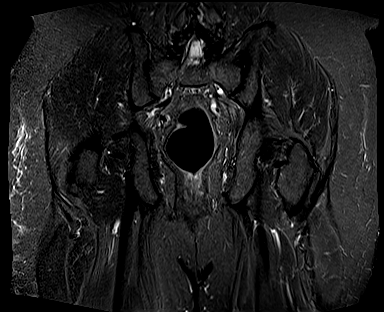
[im 15/34]
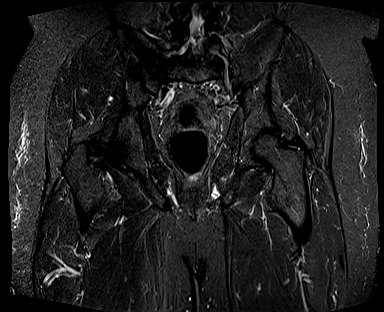
[im 19/34]
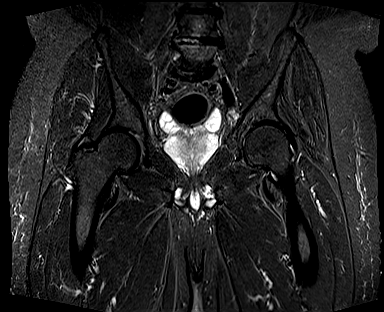
[im 23/34]
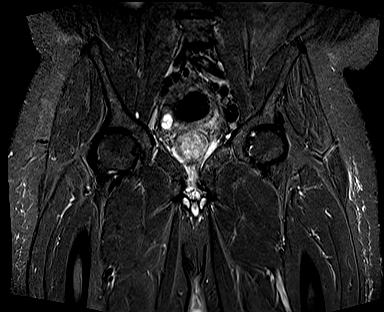
[im 26/34]
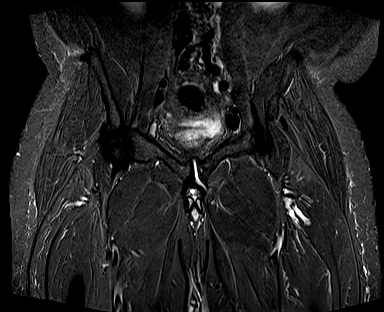
[im 30/34]
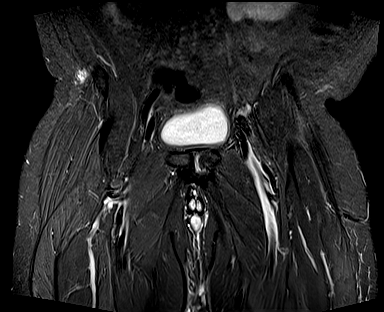
[im 34/34]
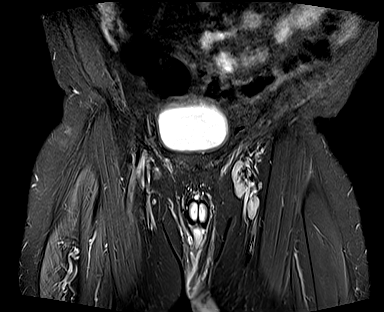

[Series 9: T2 fat-sat · axial · left · 4.0mm · 1.09mm/px · z∈[-54,+91]mm · 8 of 30 slices shown]
[im 1/30]
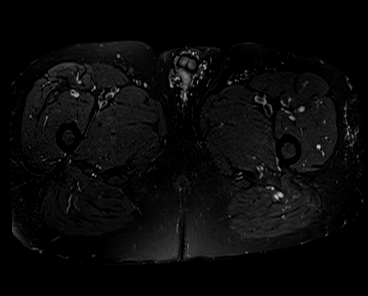
[im 5/30]
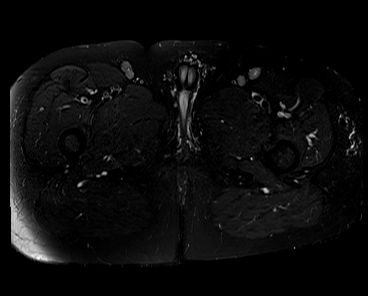
[im 9/30]
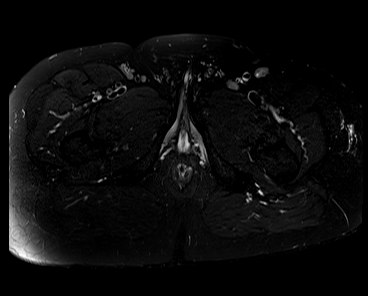
[im 13/30]
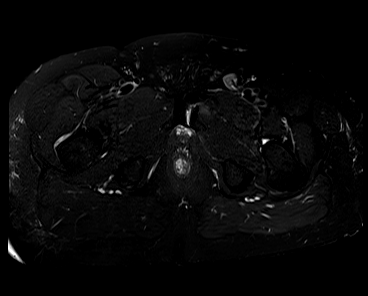
[im 17/30]
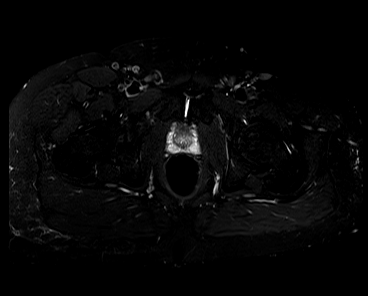
[im 21/30]
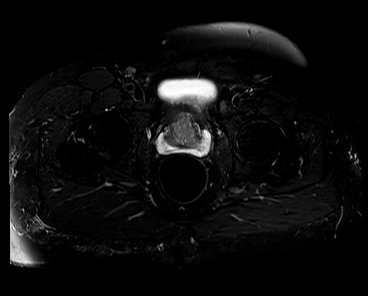
[im 25/30]
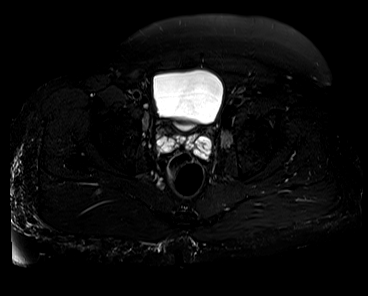
[im 30/30]
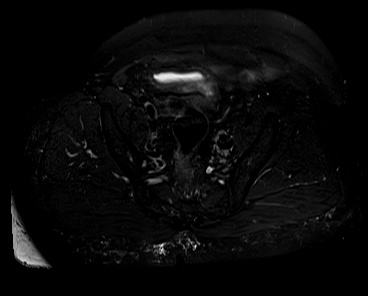

[Series 10: T1 · coronal · left · 4.0mm · 0.88mm/px · 9 of 34 slices shown]
[im 1/34]
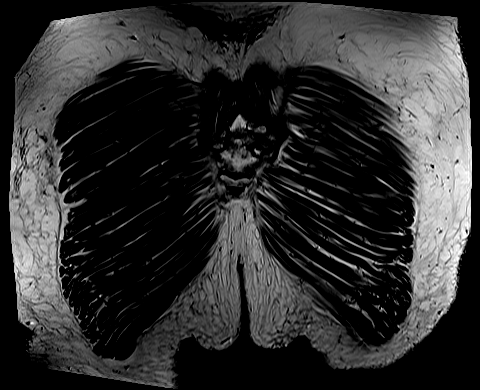
[im 5/34]
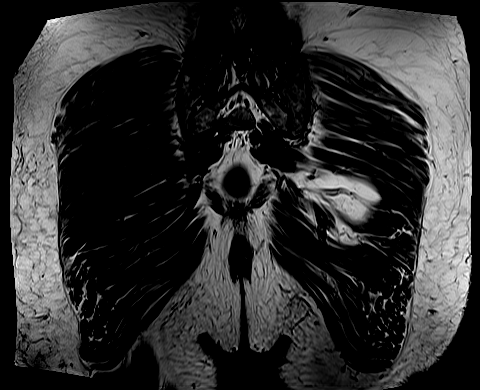
[im 9/34]
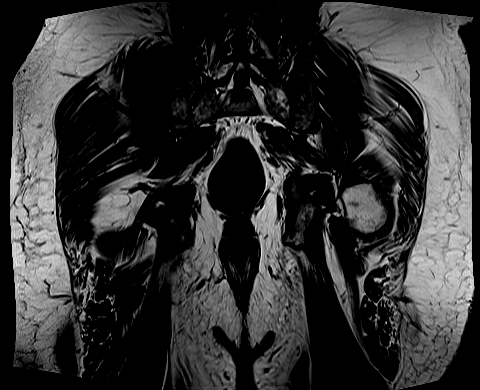
[im 13/34]
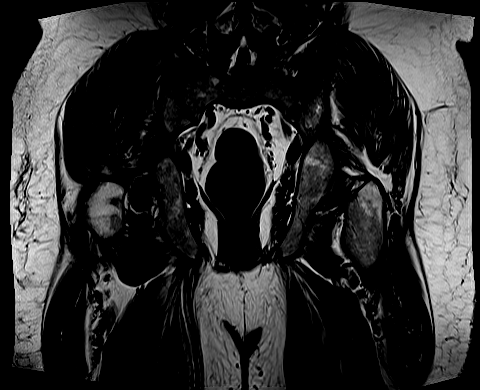
[im 17/34]
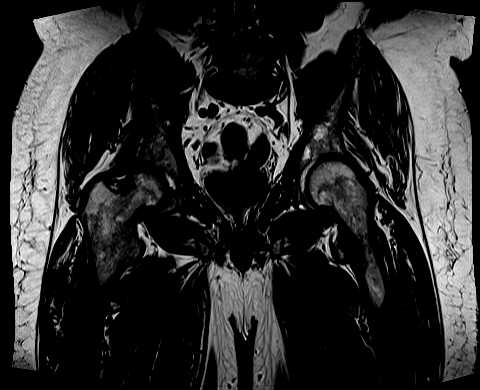
[im 21/34]
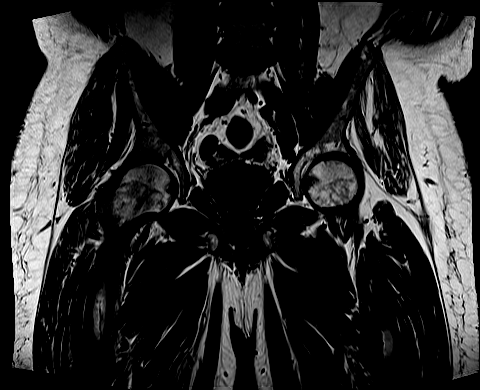
[im 25/34]
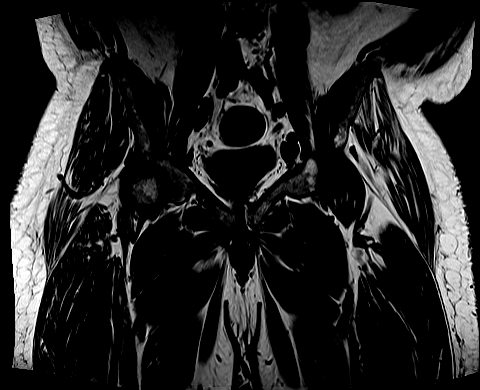
[im 29/34]
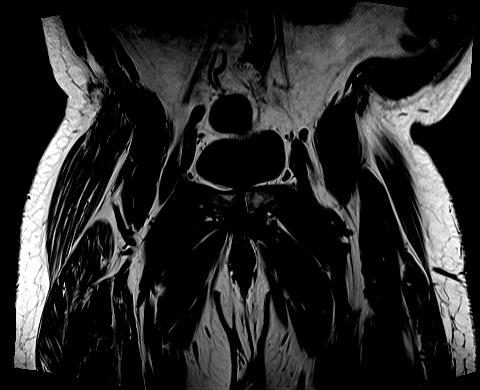
[im 34/34]
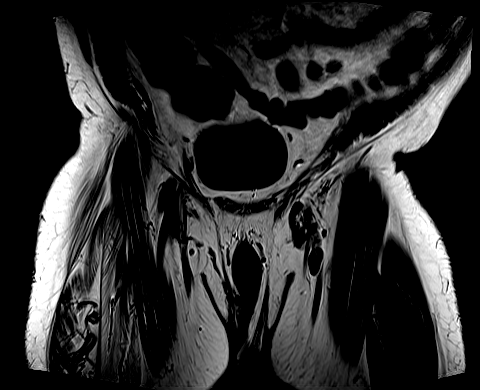

[Series 11: PD fat-sat · oblique · left · 4.0mm · 0.78mm/px · 7 of 27 slices shown (1 of 2)]
[im 1/27]
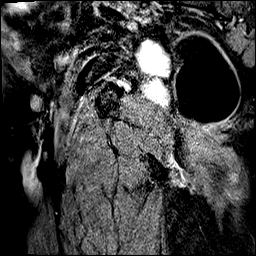
[im 5/27]
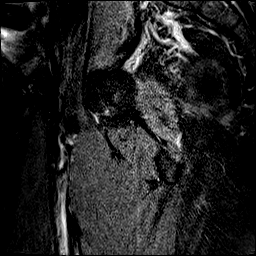
[im 9/27]
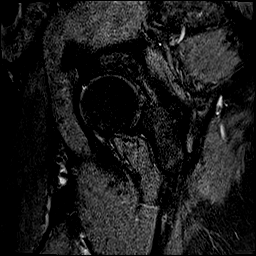
[im 14/27]
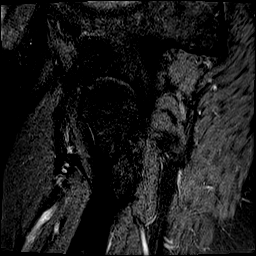
[im 18/27]
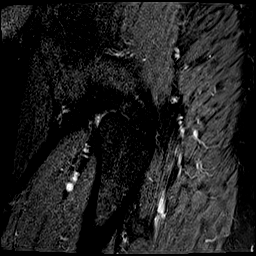
[im 22/27]
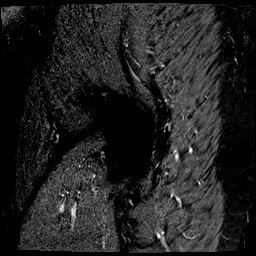
[im 27/27]
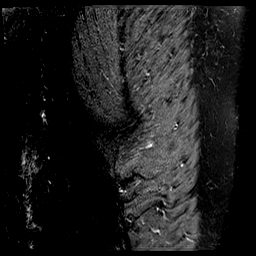

[Series 12: PD fat-sat · coronal · left · 4.0mm · 0.78mm/px · 6 of 24 slices shown (2 of 2)]
[im 1/24]
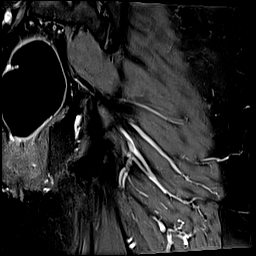
[im 5/24]
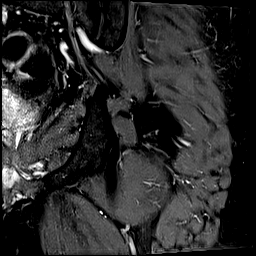
[im 10/24]
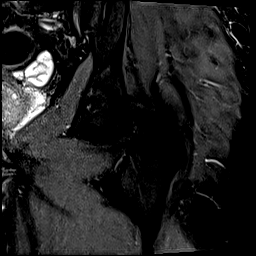
[im 14/24]
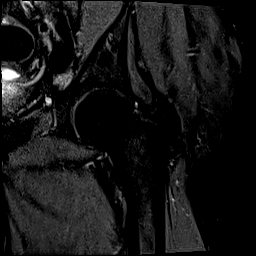
[im 19/24]
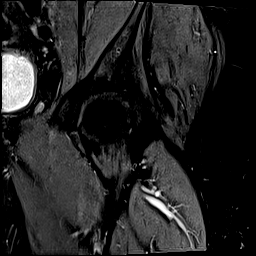
[im 24/24]
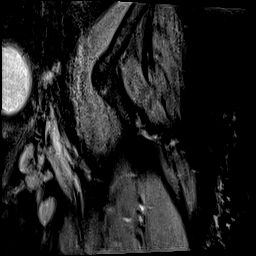

[40 of 40 positions shown; findings below may reference images not displayed]

FINDINGS: Both hips are normally located. Mild degenerative changes
bilaterally. No stress fracture or AVN. No hip joint effusion. No
periarticular fluid collections to suggest a paralabral cyst. Labral
degenerative changes with suspected labral tears.

The hip and pelvic musculature are unremarkable. No muscle tear,
myositis or mass. No findings for peritendinitis or trochanteric
bursitis. The hamstring tendons are intact.

No significant intrapelvic abnormalities are identified. Mild
prostate gland enlargement is noted. Small bilateral inguinal lymph
nodes but no mass or adenopathy. No inguinal hernia.
IMPRESSION: 1. Mild bilateral hip joint degenerative changes but no stress
fracture or AVN.
2. Labral degenerative changes with suspected labral tears.
3. Intact bony pelvis.
4. No significant intrapelvic abnormalities.

## 2021-02-14 IMAGING — DX DG FOOT COMPLETE 3+V*L*
3 series · 3 of 3 positions shown · non-contrast
Comparison: None.

CLINICAL DATA: pain, open wound lower leg, hx DM

EXAM:
LEFT FOOT - COMPLETE 3+ VIEW

[foot ap]
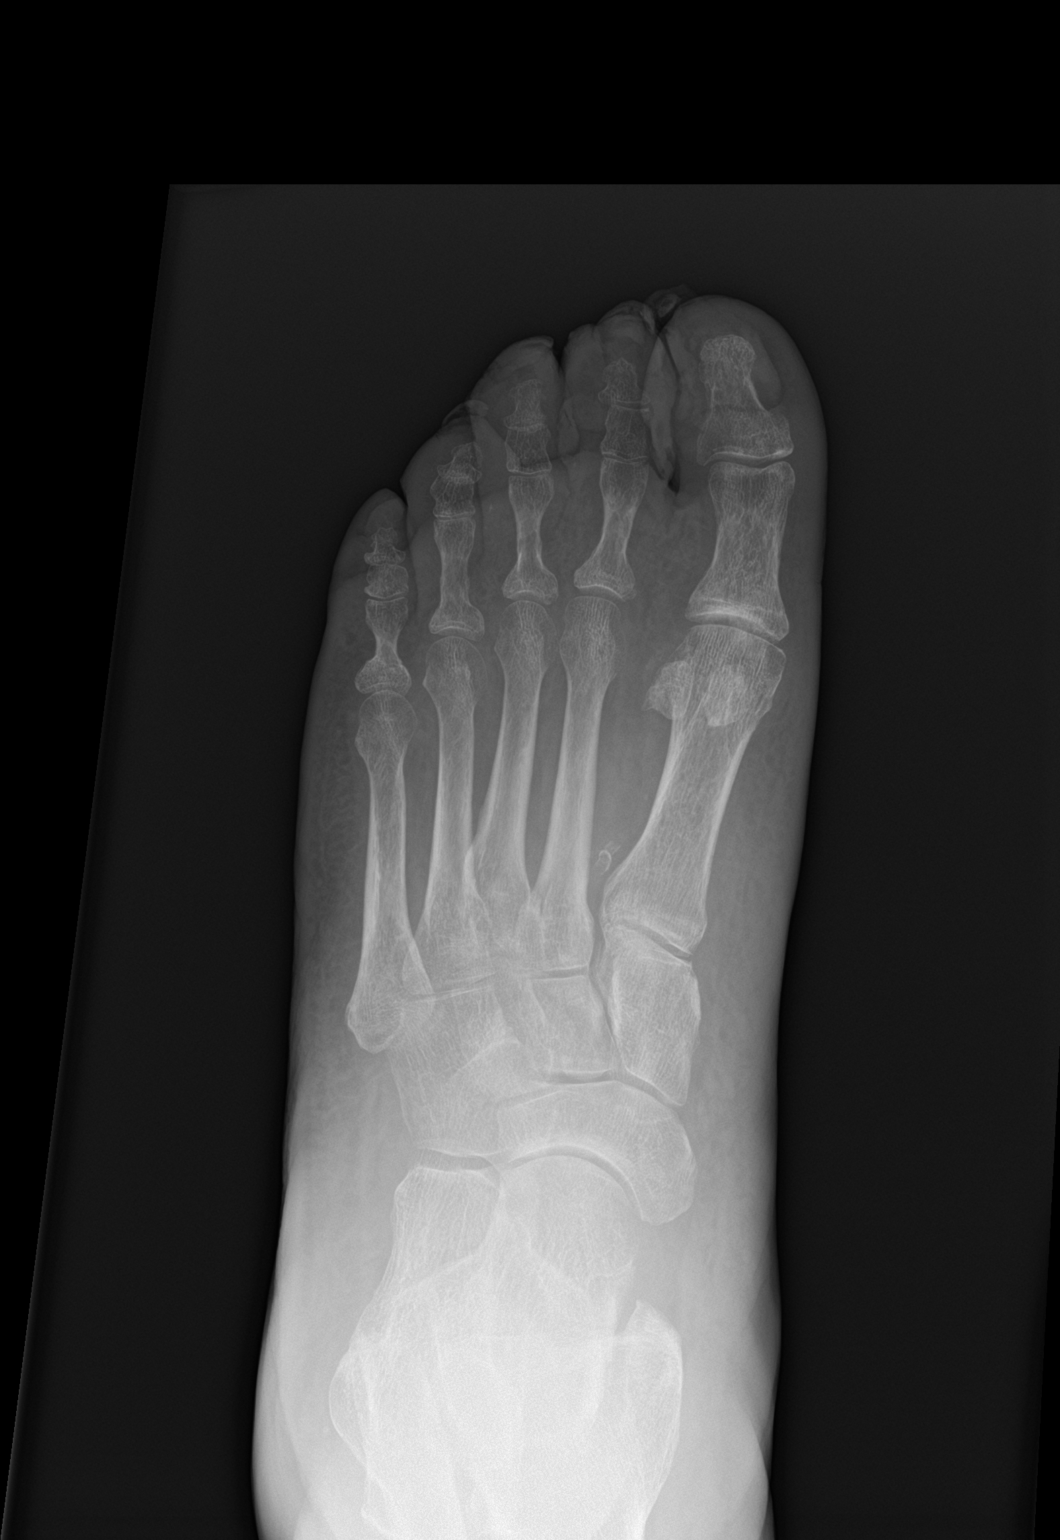

[foot obl]
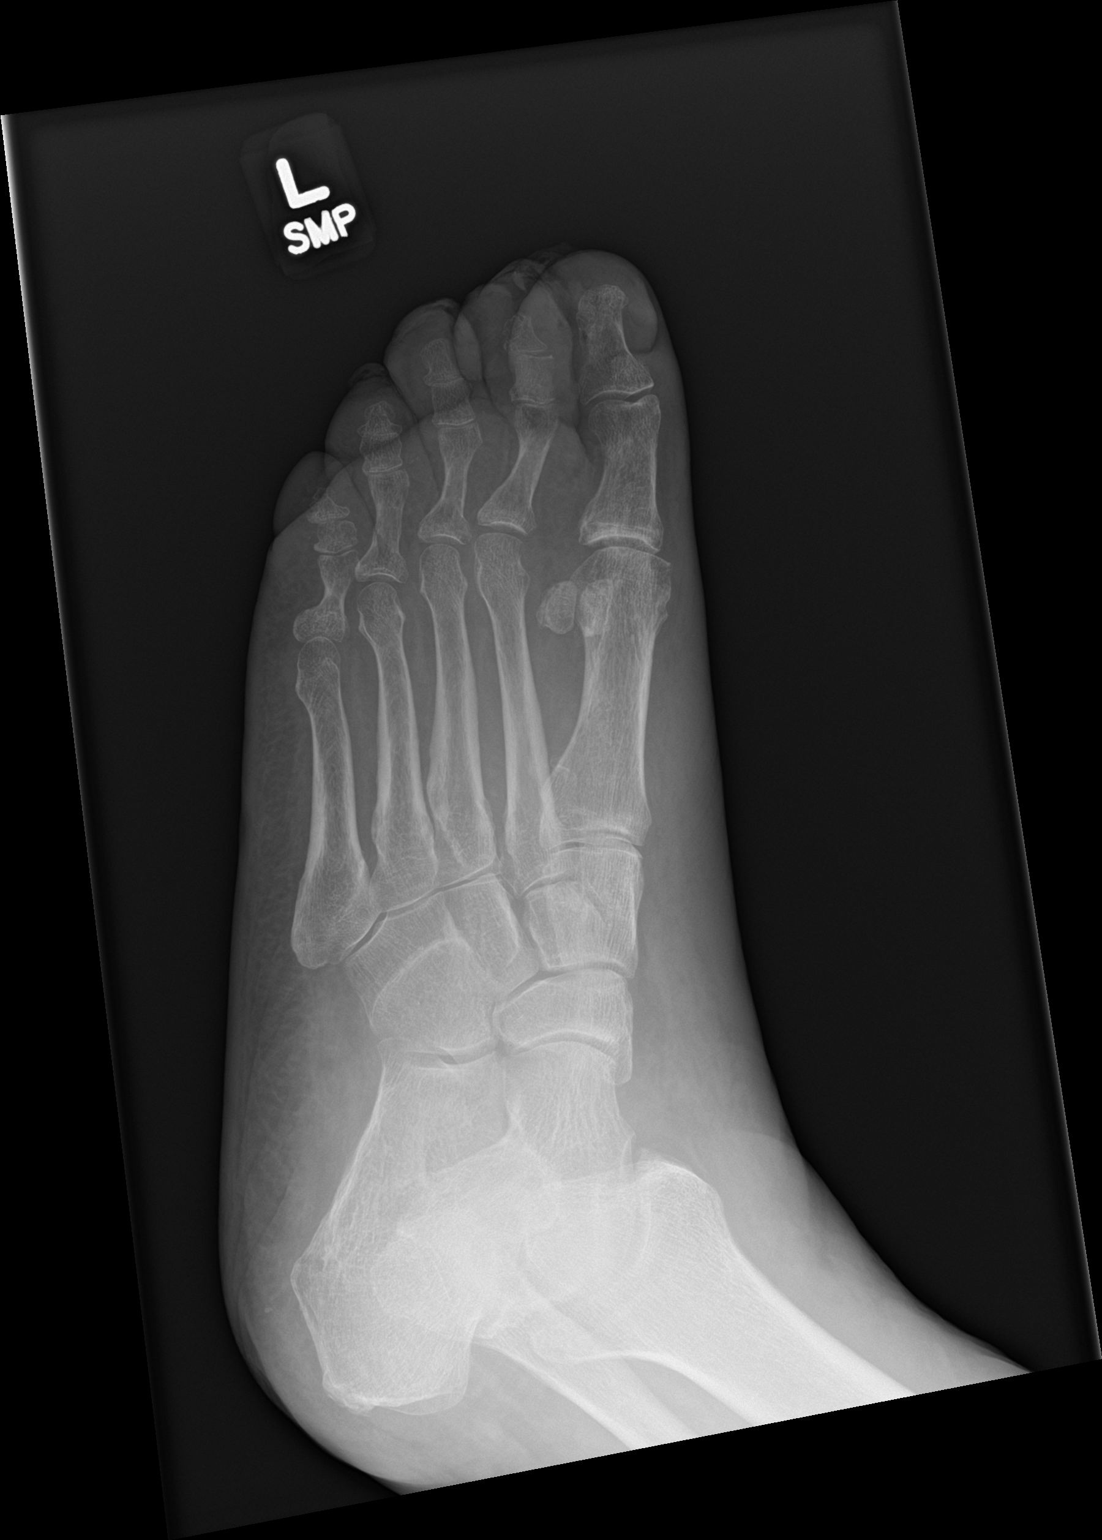

[foot lat]
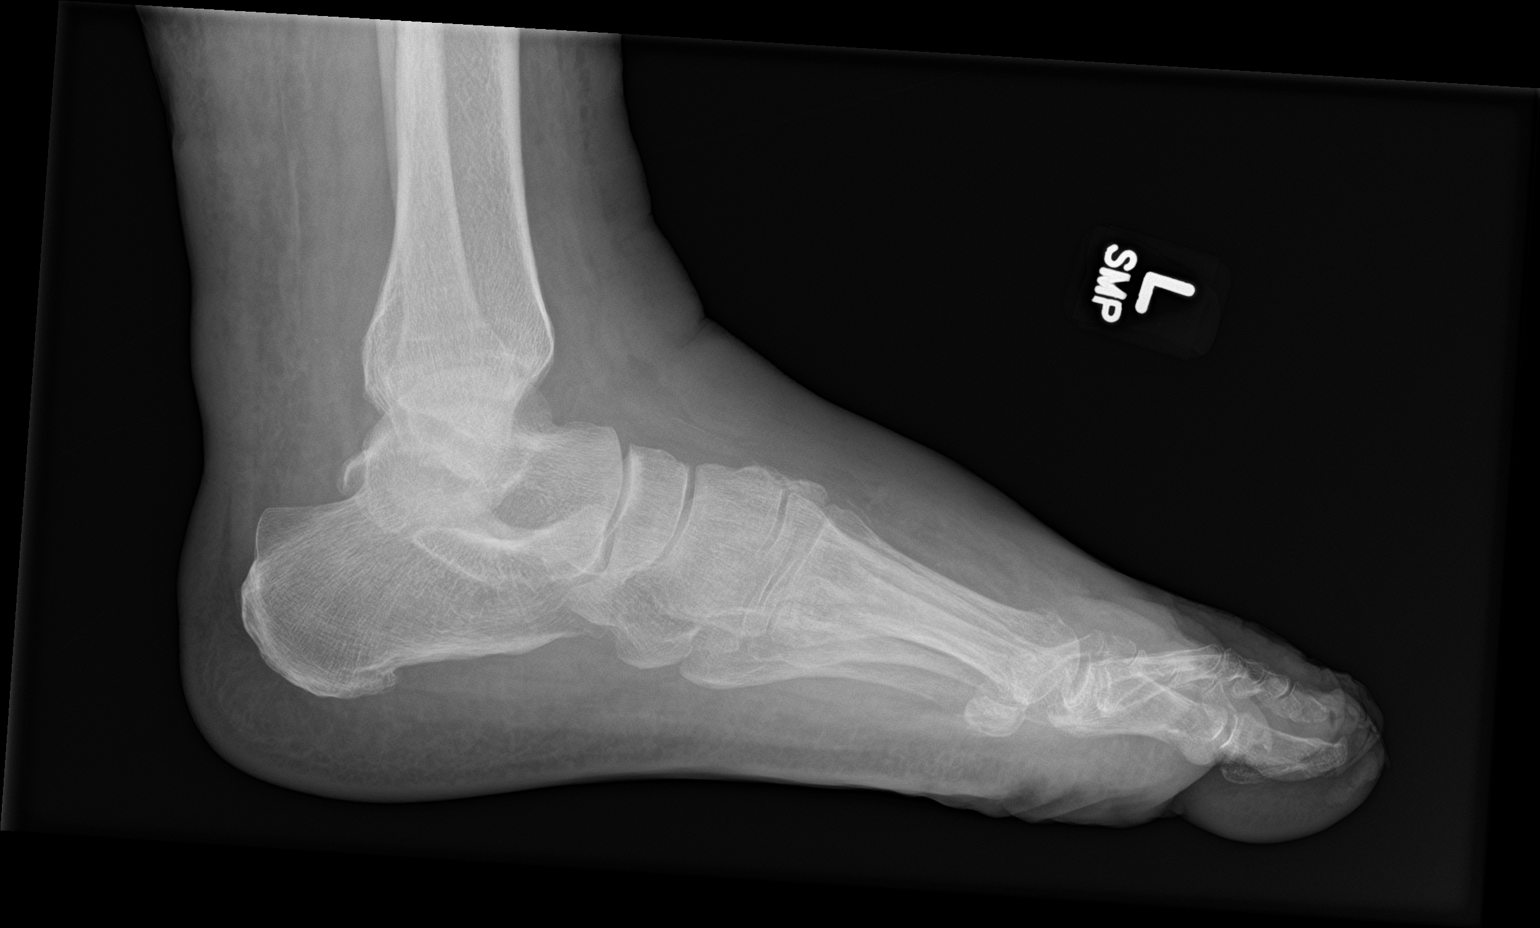

[3 of 3 positions shown; findings below may reference images not displayed]

FINDINGS: Diffuse soft tissue swelling of the foot. There is no evidence of
acute fracture. Vascular calcifications. Mild midfoot arthritis. No
frank bony destruction.
IMPRESSION: Diffuse soft tissue swelling without evidence of acute osseous
abnormality in the left foot.

## 2021-02-14 IMAGING — MR MR [PERSON_NAME] LOW W/O CM*L*
7 series · 40 of 40 positions shown · non-contrast
Comparison: Radiographs, same date.

CLINICAL DATA: Open wounds left lower extremity.

EXAM:
MRI OF LOWER LEFT EXTREMITY WITHOUT CONTRAST
TECHNIQUE: Multiplanar, multisequence MR imaging of the left lower extremity
was performed. No intravenous contrast was administered.

[Series 7: composed cor t1_comp · coronal · left · 5.0mm · 1.08mm/px · 4 of 34 slices shown]
[im 1/34]
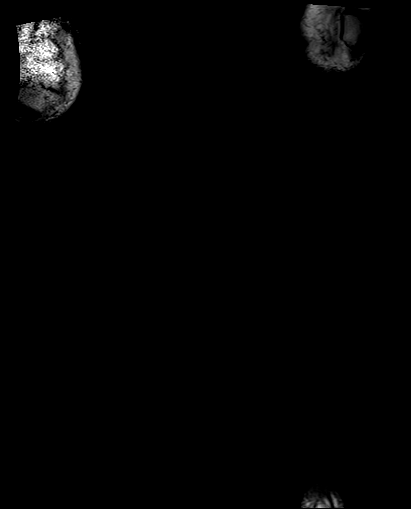
[im 12/34]
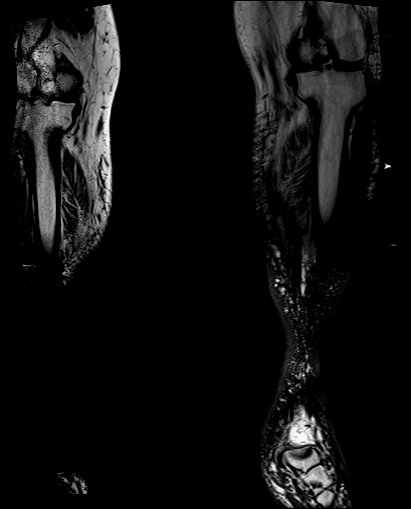
[im 23/34]
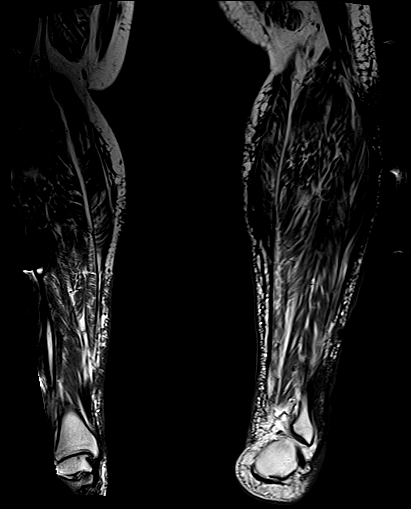
[im 34/34]
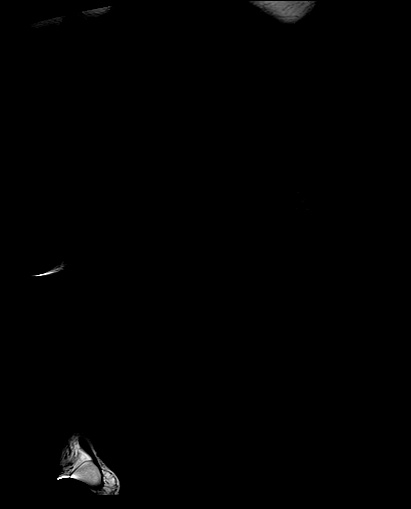

[Series 11: composed cor stir_comp · coronal · left · 5.0mm · 1.08mm/px · 4 of 34 slices shown]
[im 1/34]
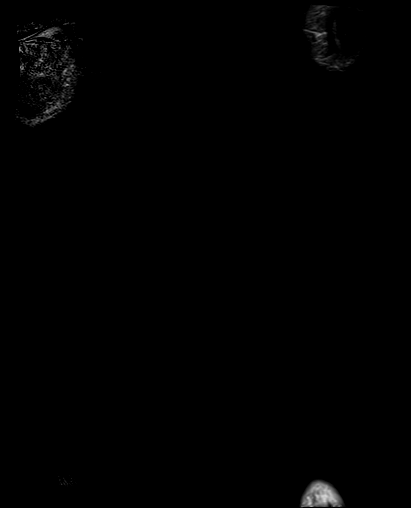
[im 12/34]
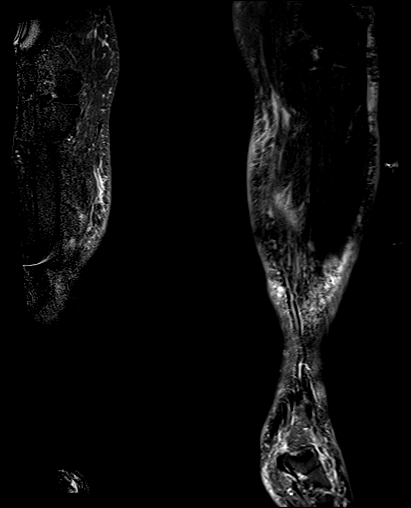
[im 23/34]
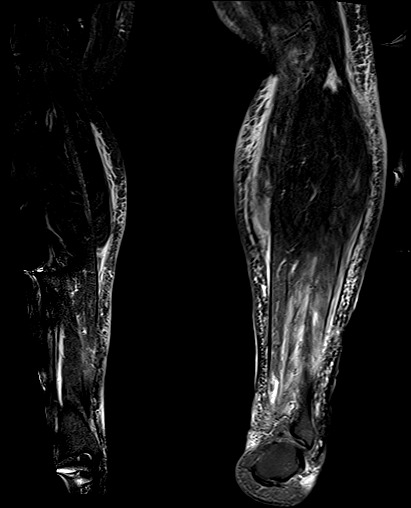
[im 34/34]
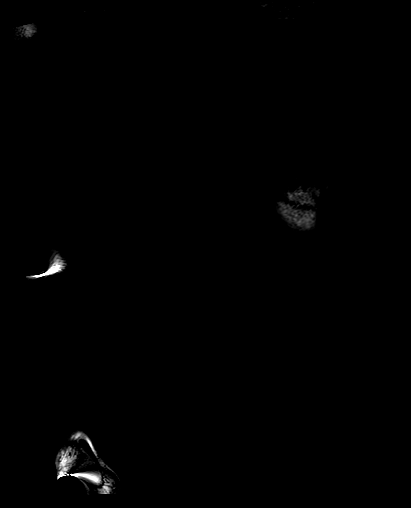

[Series 15: ax t1_comp · axial · left · 5.0mm · 0.86mm/px · z∈[+40,+500]mm · 9 of 89 slices shown]
[im 1/89]
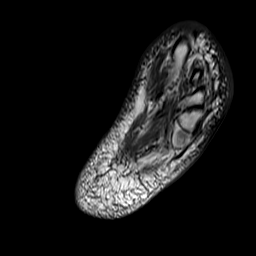
[im 12/89]
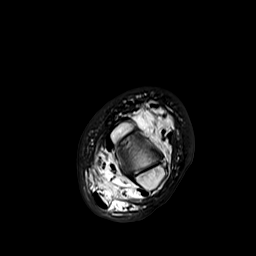
[im 23/89]
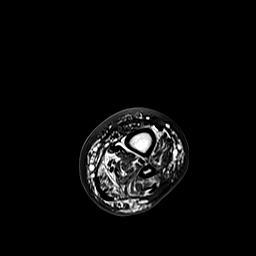
[im 34/89]
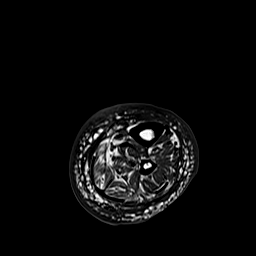
[im 45/89]
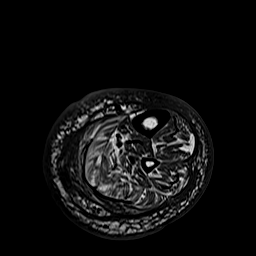
[im 56/89]
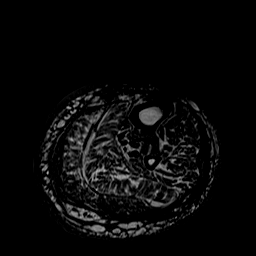
[im 67/89]
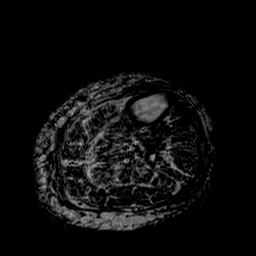
[im 78/89]
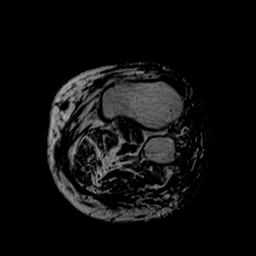
[im 89/89]
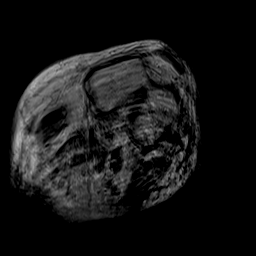

[Series 18: t2_tse_sag fs_comp · sagittal · left · 5.0mm · 0.94mm/px · 4 of 38 slices shown]
[im 1/38]
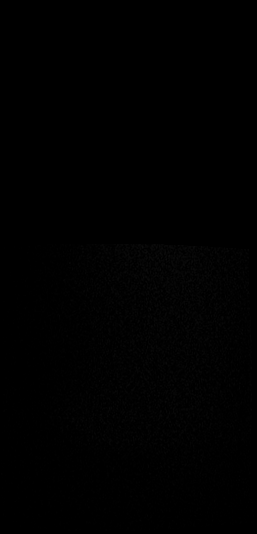
[im 13/38]
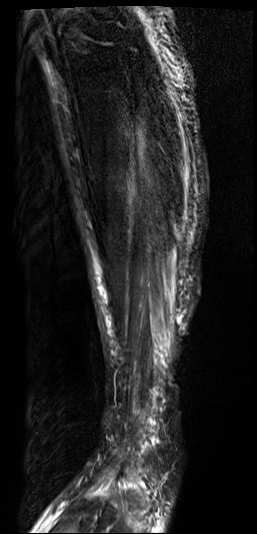
[im 25/38]
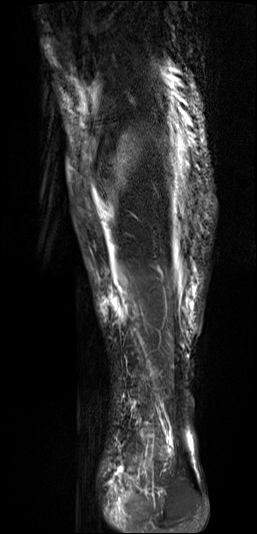
[im 38/38]
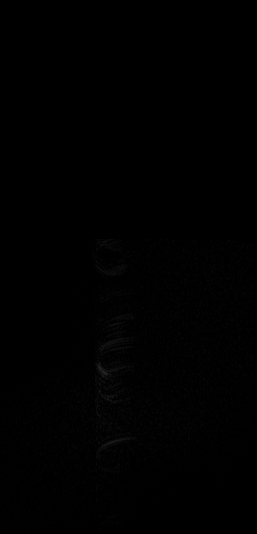

[Series 19: T2 fat-sat · axial · left · 4.0mm · 0.86mm/px · z∈[+275,+500]mm · 5 of 46 slices shown (1 of 2)]
[im 1/46]
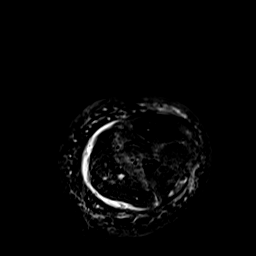
[im 12/46]
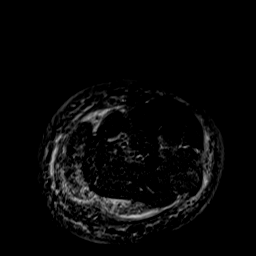
[im 23/46]
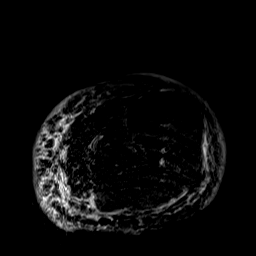
[im 34/46]
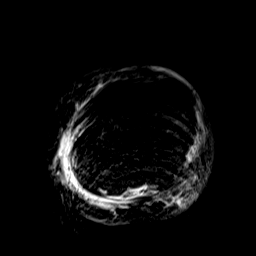
[im 46/46]
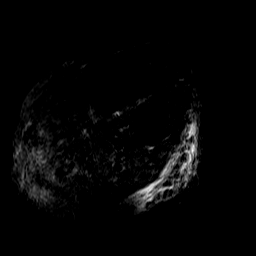

[Series 20: T2 fat-sat · axial · left · 5.0mm · 0.86mm/px · z∈[+40,+310]mm · 5 of 48 slices shown (2 of 2)]
[im 1/48]
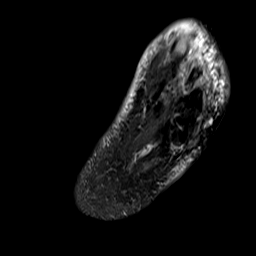
[im 12/48]
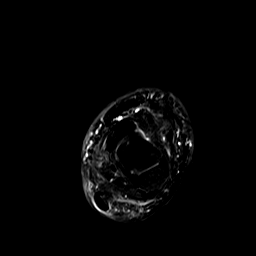
[im 24/48]
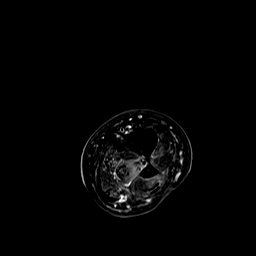
[im 36/48]
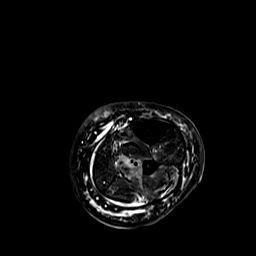
[im 48/48]
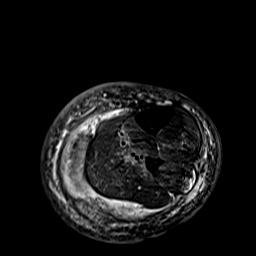

[Series 21: T2 · axial · left · 5.0mm · 0.86mm/px · z∈[+40,+500]mm · 9 of 88 slices shown]
[im 1/88]
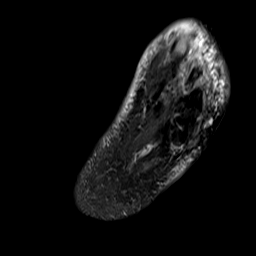
[im 11/88]
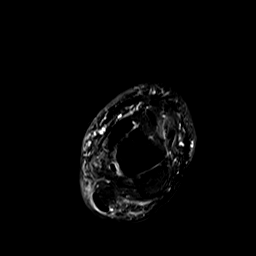
[im 22/88]
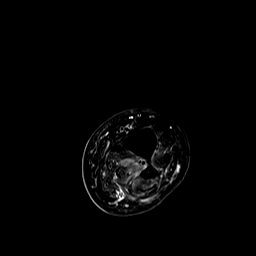
[im 33/88]
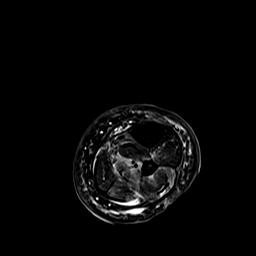
[im 44/88]
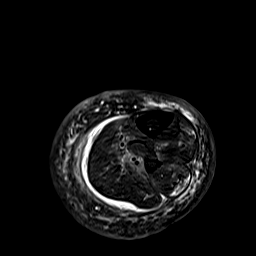
[im 55/88]
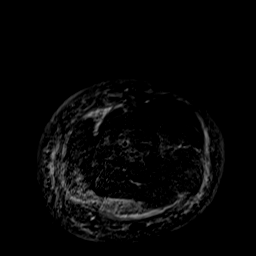
[im 66/88]
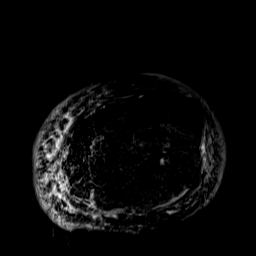
[im 77/88]
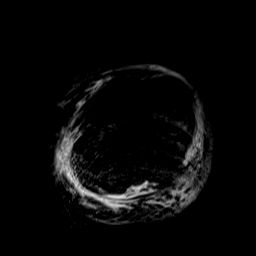
[im 88/88]
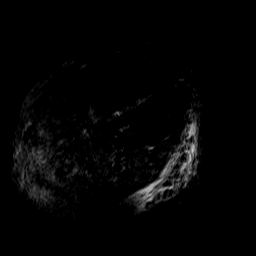

[40 of 40 positions shown; findings below may reference images not displayed]

FINDINGS: Examination limited by patient motion.

There is diffuse and marked subcutaneous soft tissue
swelling/edema/fluid suggesting cellulitis. There is a
moderate-sized open wound noted along the lateral aspect of the
lower leg but I do not see a discrete fluid collection to suggest a
drainable abscess. Moderate fluid surrounding the calf muscle
fascia.

Edema like signal changes in the gastroc muscles bilaterally
suggesting myositis. The soleus muscles are unremarkable. No
findings suspicious for pyomyositis.

The tibia and fibula are intact.  No findings for osteomyelitis.
IMPRESSION: 1. Diffuse and marked subcutaneous soft tissue swelling/edema/fluid
suggesting cellulitis.
2. Moderate-sized open wound along the lateral aspect of the lower
leg but I do not see a discrete fluid collection to suggest a
drainable abscess.
3. Edema like signal changes in the gastroc muscles bilaterally
suggesting myositis.
4. No findings for pyomyositis or osteomyelitis.

## 2021-02-14 IMAGING — DX DG HIP (WITH OR WITHOUT PELVIS) 2-3V*L*
3 series · 3 of 3 positions shown · non-contrast
Comparison: None.

CLINICAL DATA: Pain.  Open wound lower leg

EXAM:
DG HIP (WITH OR WITHOUT PELVIS) 2-3V LEFT

[pelvis ap]
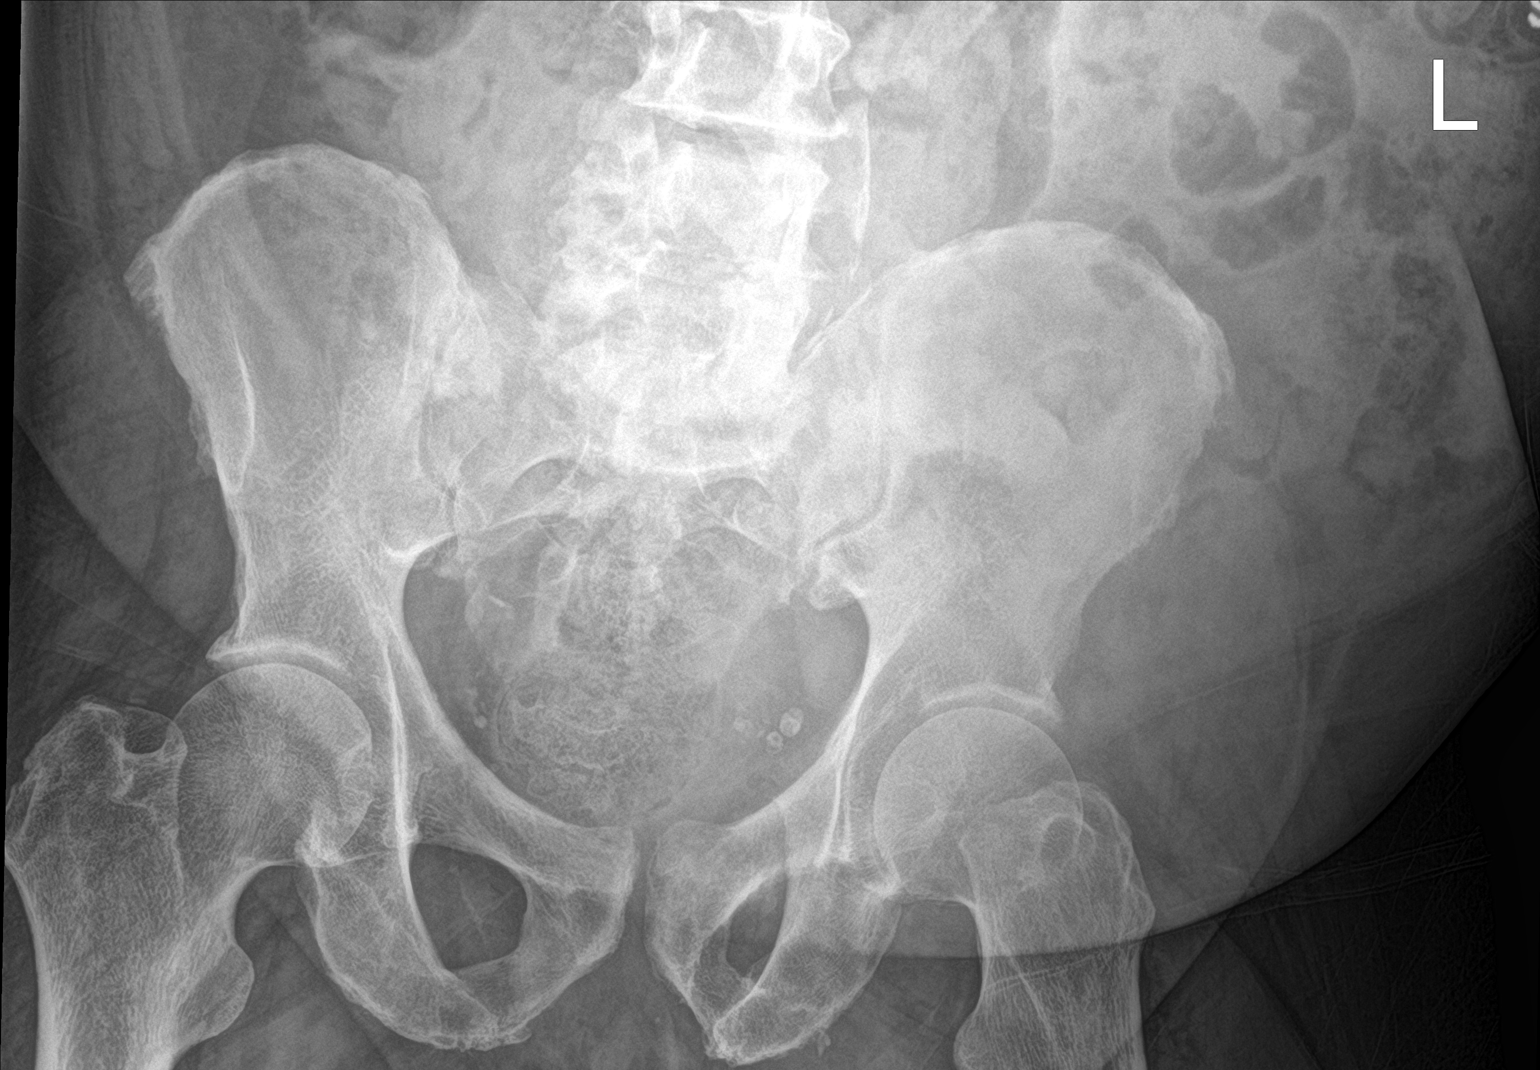

[hip ap]
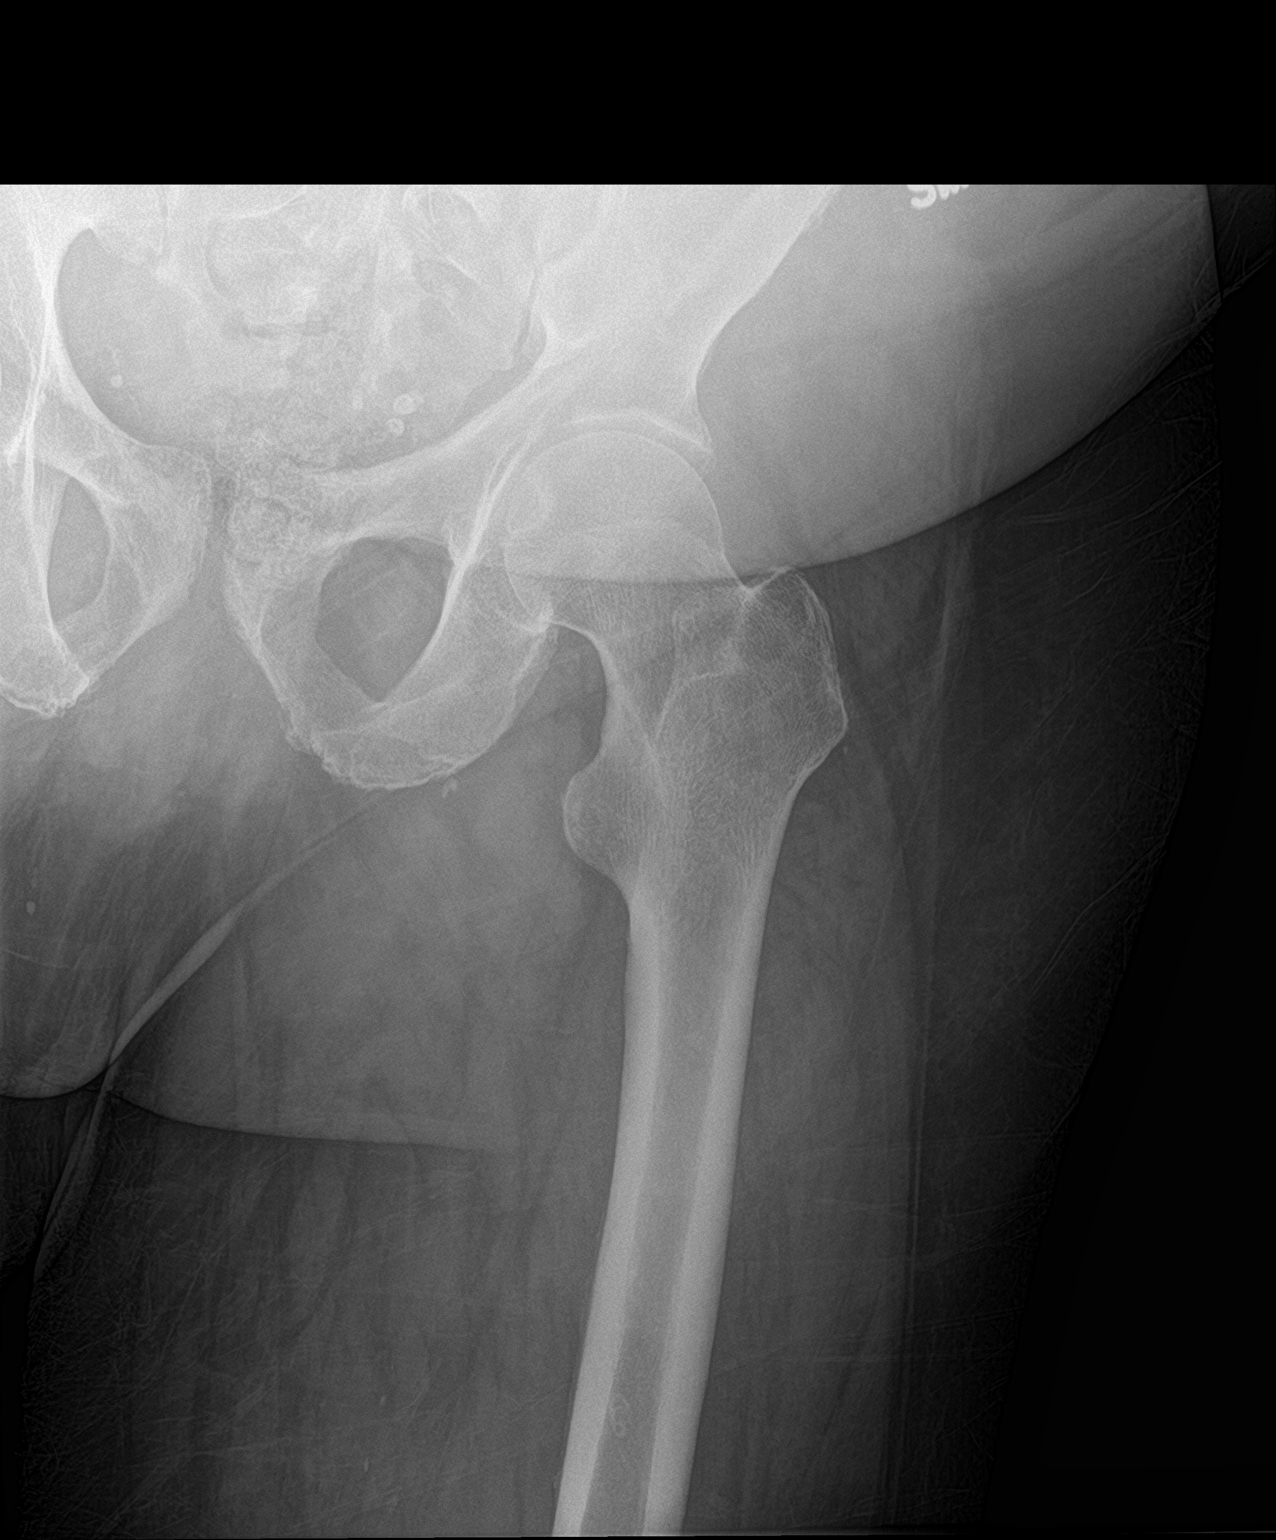

[hip lat]
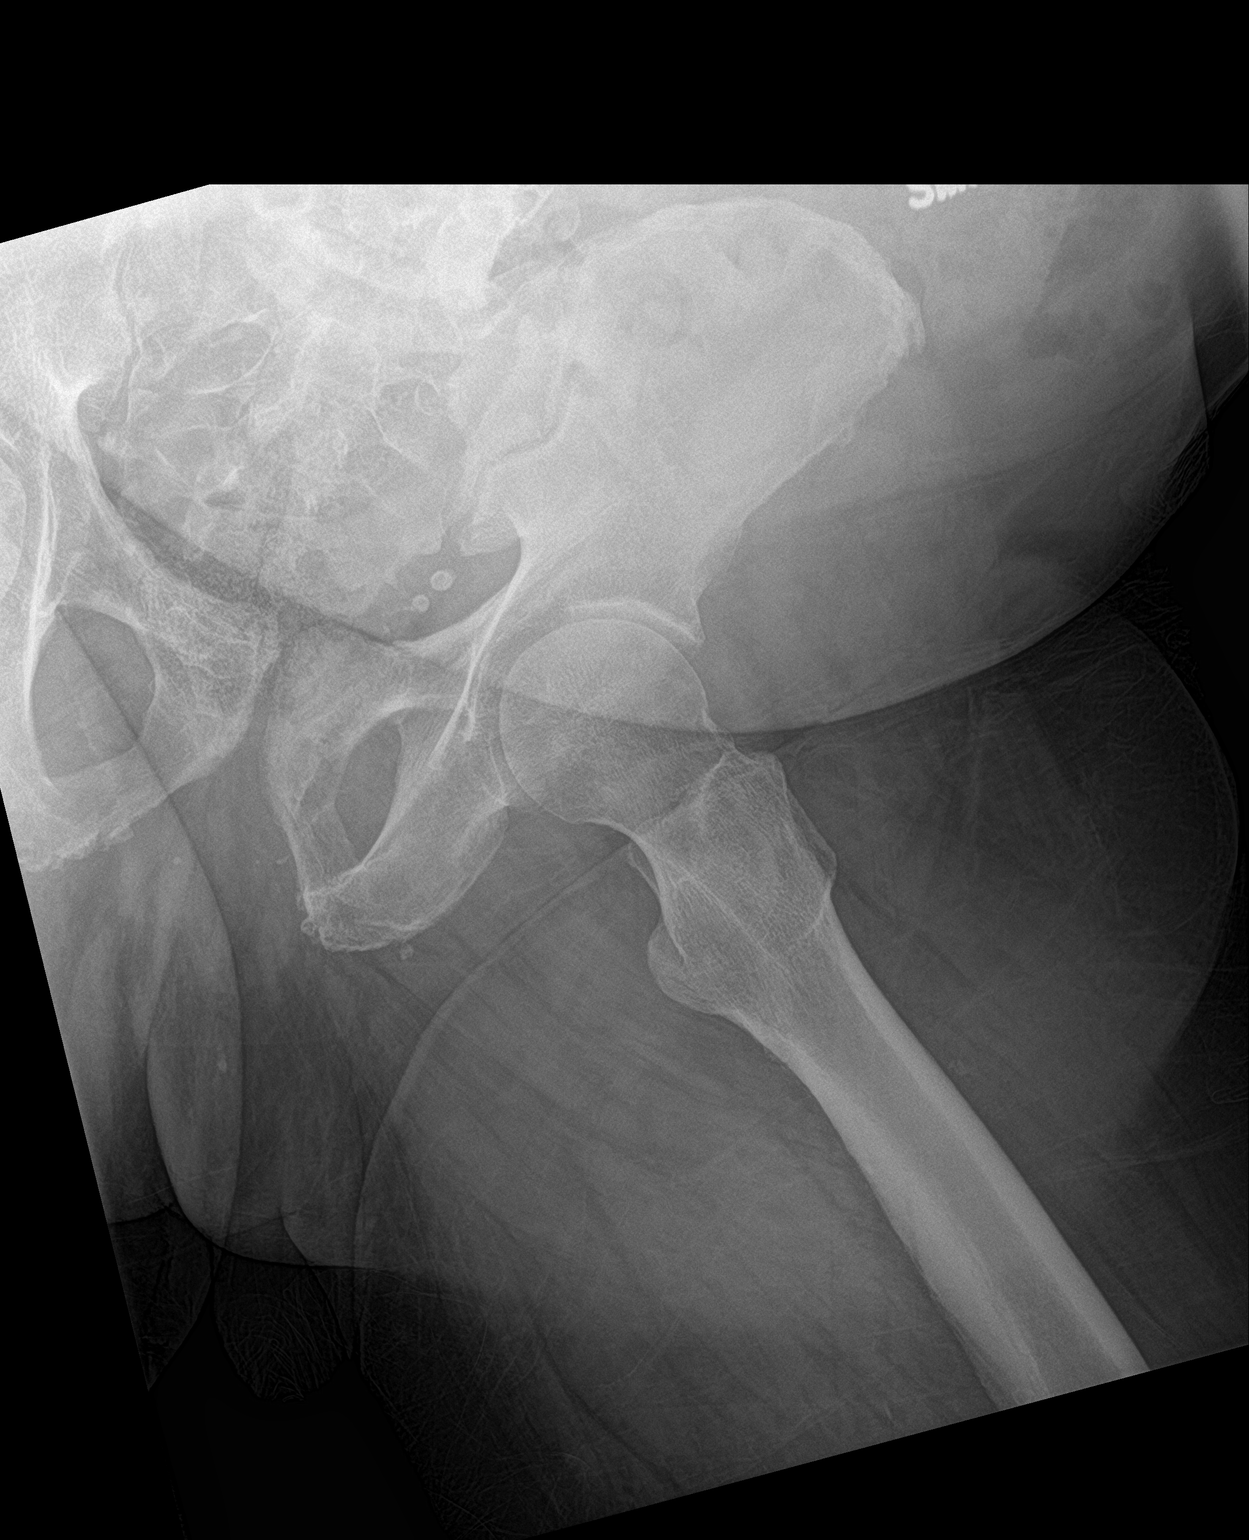

[3 of 3 positions shown; findings below may reference images not displayed]

FINDINGS: Hip joints and SI joints symmetric and unremarkable. Degenerative
changes in the visualized lower lumbar spine. No acute bony
abnormality. Specifically, no fracture, subluxation, or dislocation.
Soft tissues are intact.
IMPRESSION: No acute bony abnormality.

## 2021-02-14 IMAGING — DX DG TIBIA/FIBULA 2V*L*
3 series · 3 of 3 positions shown · non-contrast
Comparison: None.

CLINICAL DATA: pain, open wound lower leg, hx DM

EXAM:
LEFT TIBIA AND FIBULA - 2 VIEW

[tibia ap (1 of 2)]
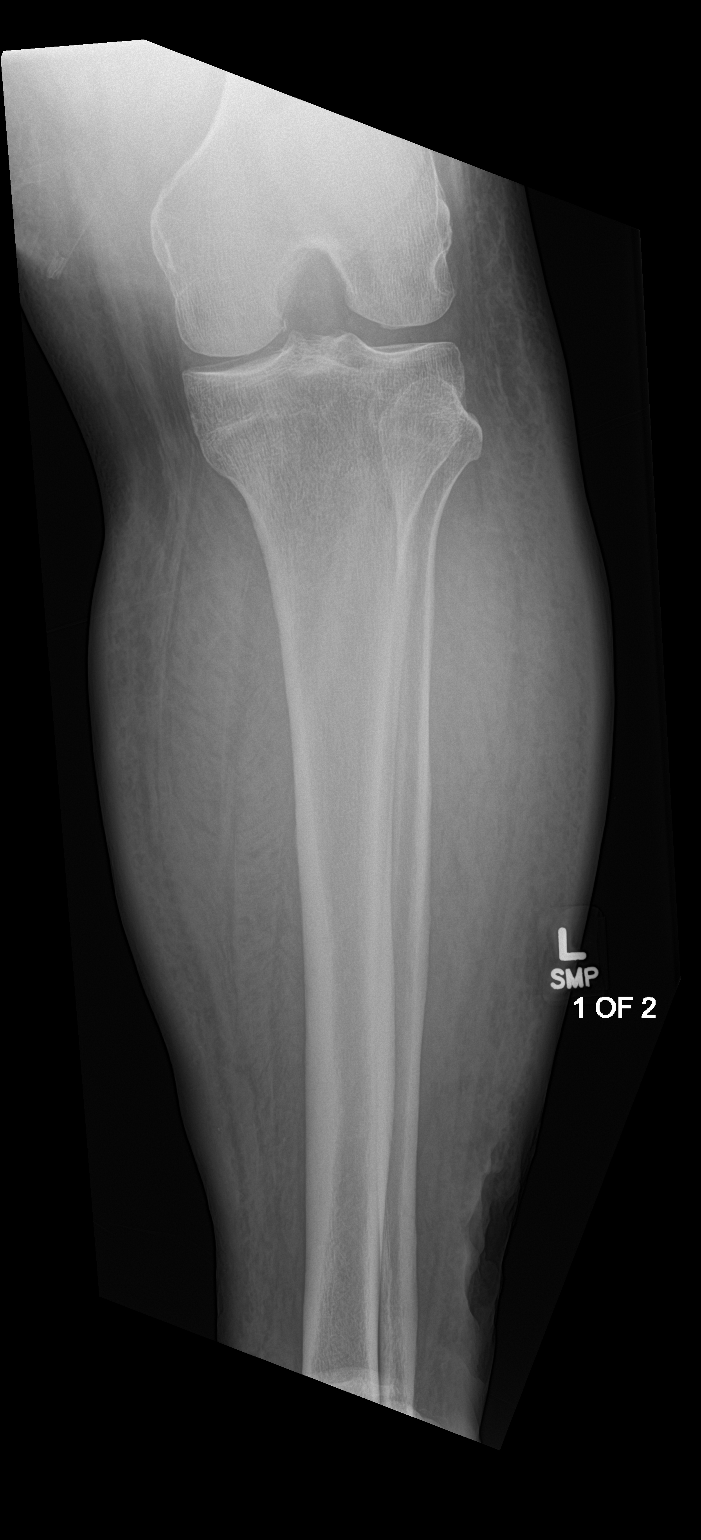

[tibia ap (2 of 2)]
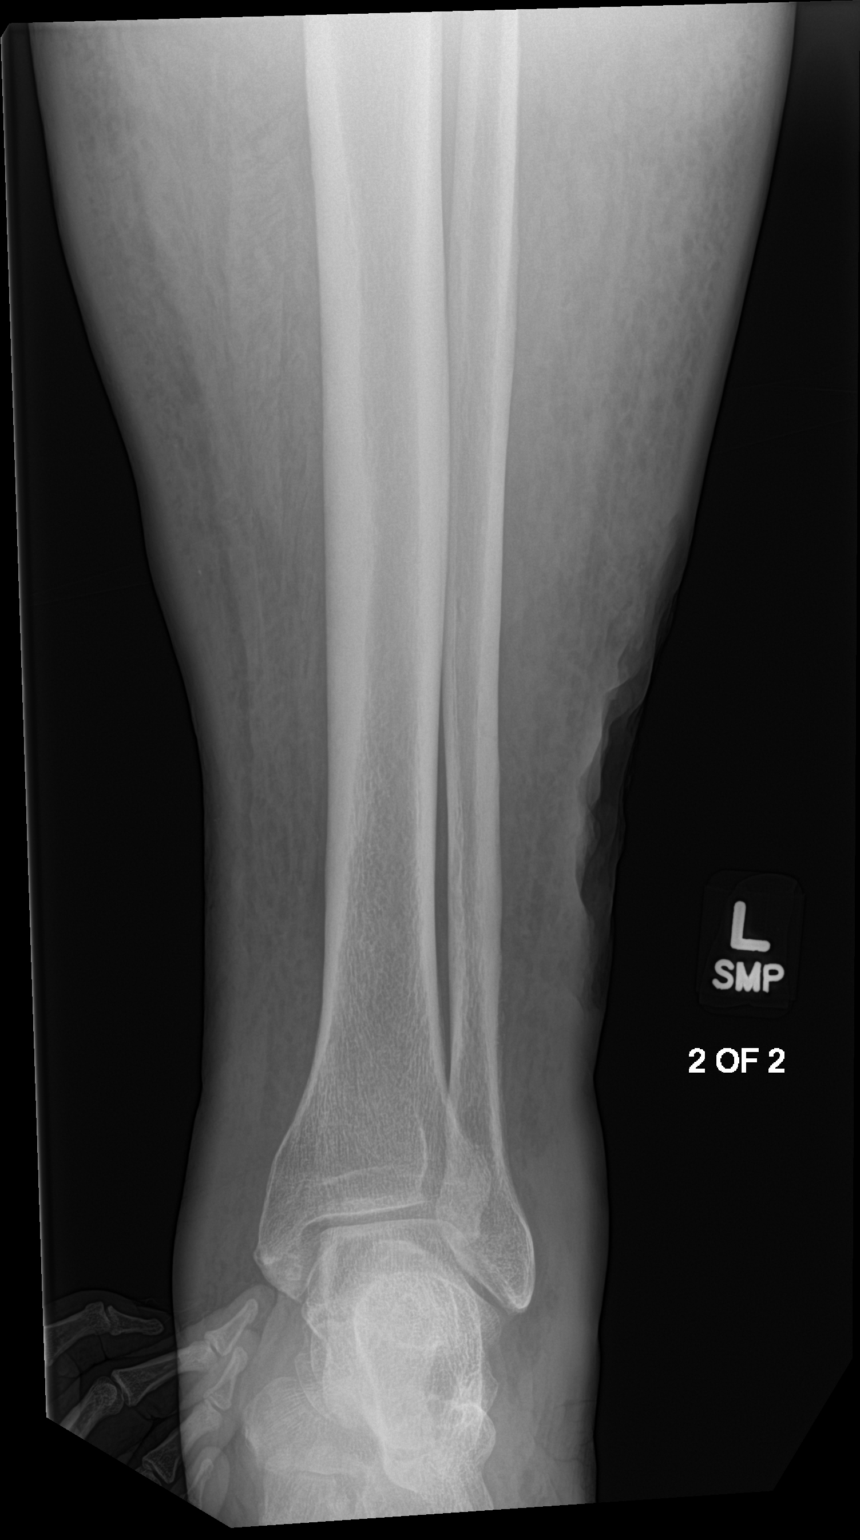

[tibia lat]
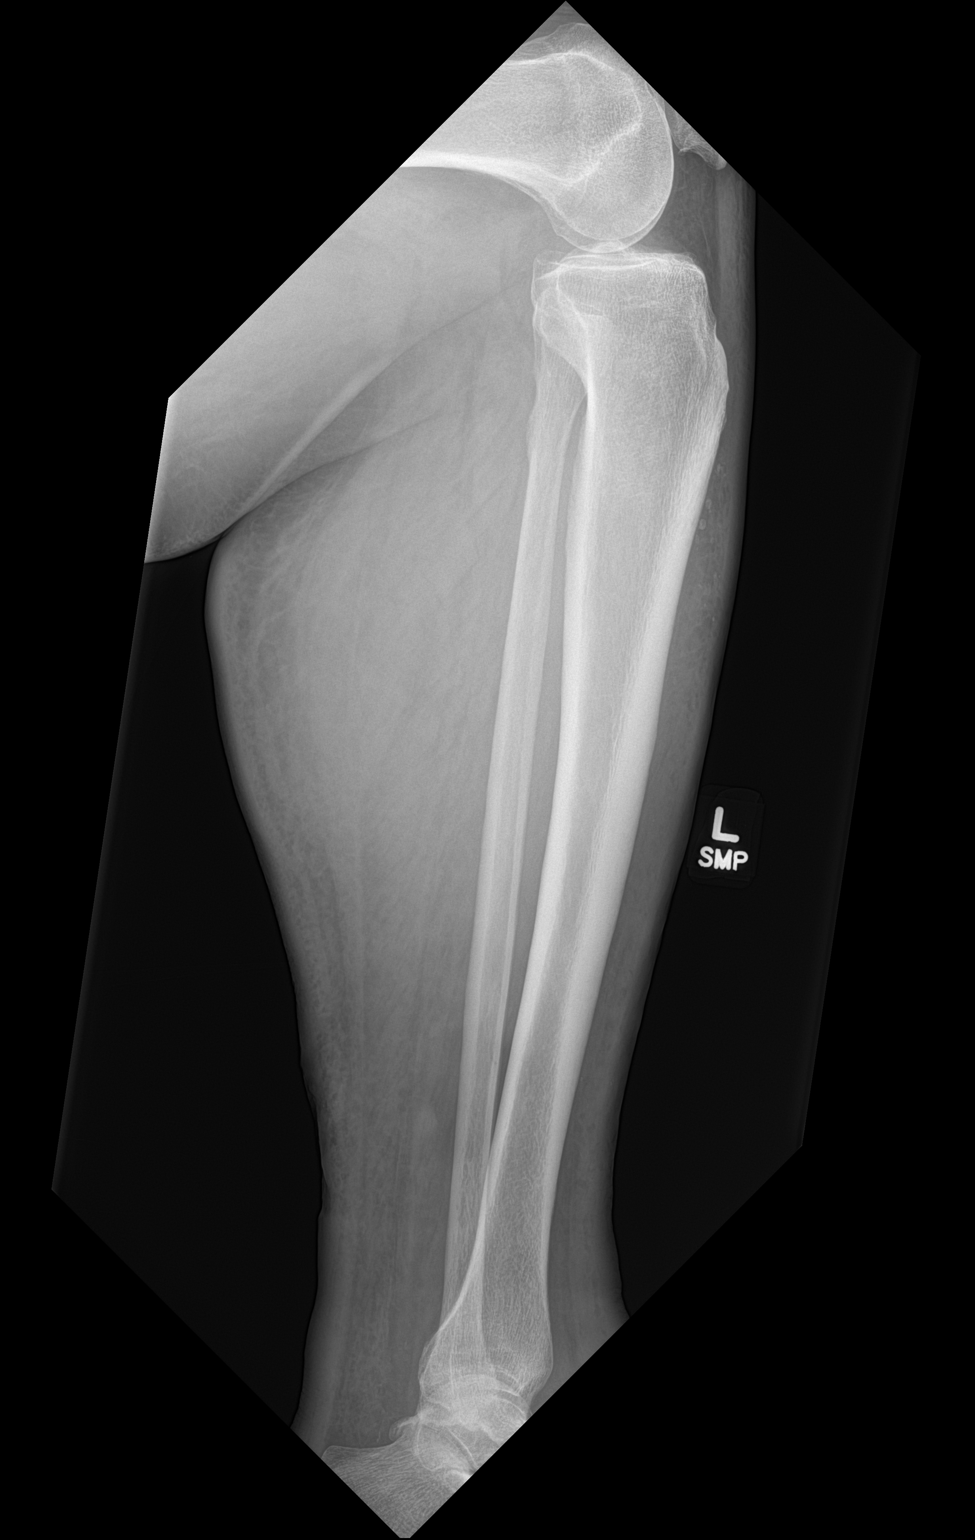

[3 of 3 positions shown; findings below may reference images not displayed]

FINDINGS: There is a soft tissue wound along the lateral lower leg. Diffuse
soft tissue swelling. There is mild periostitis along the lateral
aspect of the distal fibula. No acute fracture.
IMPRESSION: Diffuse lower leg soft tissue swelling with large lateral lower leg
soft tissue wound. Adjacent mild periostitis of the lateral aspect
of the distal fibula, concerning for possible developing bone
infection.

## 2021-02-14 MED ORDER — MORPHINE SULFATE (PF) 2 MG/ML IV SOLN
2.0000 mg | INTRAVENOUS | Status: DC | PRN
  Administered 2021-02-14 – 2021-02-15 (×2): 2 mg via INTRAVENOUS
  Filled 2021-02-14 (×2): qty 1

## 2021-02-14 MED ORDER — VANCOMYCIN HCL 2000 MG/400ML IV SOLN
2000.0000 mg | Freq: Once | INTRAVENOUS | Status: AC
  Administered 2021-02-14: 2000 mg via INTRAVENOUS
  Filled 2021-02-14: qty 400

## 2021-02-14 MED ORDER — SODIUM CHLORIDE 0.9 % IV BOLUS
1000.0000 mL | Freq: Once | INTRAVENOUS | Status: AC
  Administered 2021-02-14: 1000 mL via INTRAVENOUS

## 2021-02-14 MED ORDER — ENOXAPARIN SODIUM 40 MG/0.4ML IJ SOSY: 40.0000 mg | PREFILLED_SYRINGE | Freq: Every day | INTRAMUSCULAR | Status: DC

## 2021-02-14 MED ORDER — SODIUM CHLORIDE 0.9 % IV SOLN
2.0000 g | INTRAVENOUS | Status: AC
  Administered 2021-02-14 – 2021-02-20 (×7): 2 g via INTRAVENOUS
  Filled 2021-02-14 (×7): qty 20

## 2021-02-14 MED ORDER — INSULIN ASPART 100 UNIT/ML IJ SOLN: 0.0000 [IU] | Freq: Every day | INTRAMUSCULAR | Status: DC

## 2021-02-14 MED ORDER — VANCOMYCIN HCL IN DEXTROSE 1-5 GM/200ML-% IV SOLN: 1000.0000 mg | Freq: Once | INTRAVENOUS | Status: DC

## 2021-02-14 MED ORDER — METRONIDAZOLE 500 MG/100ML IV SOLN
500.0000 mg | Freq: Two times a day (BID) | INTRAVENOUS | Status: AC
  Administered 2021-02-14 – 2021-02-21 (×14): 500 mg via INTRAVENOUS
  Filled 2021-02-14 (×14): qty 100

## 2021-02-14 MED ORDER — ATORVASTATIN CALCIUM 40 MG PO TABS
40.0000 mg | ORAL_TABLET | Freq: Every day | ORAL | Status: DC
  Administered 2021-02-15 – 2021-02-23 (×8): 40 mg via ORAL
  Filled 2021-02-14 (×8): qty 1

## 2021-02-14 MED ORDER — METHOCARBAMOL 1000 MG/10ML IJ SOLN
500.0000 mg | Freq: Four times a day (QID) | INTRAVENOUS | Status: DC | PRN
  Administered 2021-02-14: 500 mg via INTRAVENOUS
  Filled 2021-02-14: qty 5

## 2021-02-14 MED ORDER — ENOXAPARIN SODIUM 60 MG/0.6ML IJ SOSY
55.0000 mg | PREFILLED_SYRINGE | Freq: Every day | INTRAMUSCULAR | Status: DC
  Administered 2021-02-14 – 2021-02-22 (×9): 55 mg via SUBCUTANEOUS
  Filled 2021-02-14 (×9): qty 0.6

## 2021-02-14 MED ORDER — FENTANYL CITRATE PF 50 MCG/ML IJ SOSY
50.0000 ug | PREFILLED_SYRINGE | Freq: Once | INTRAMUSCULAR | Status: AC
  Administered 2021-02-14: 50 ug via INTRAVENOUS
  Filled 2021-02-14: qty 1

## 2021-02-14 MED ORDER — METHOCARBAMOL 1000 MG/10ML IJ SOLN
INTRAMUSCULAR | Status: AC
  Filled 2021-02-14: qty 10

## 2021-02-14 MED ORDER — HYDROCODONE-ACETAMINOPHEN 5-325 MG PO TABS
1.0000 | ORAL_TABLET | Freq: Four times a day (QID) | ORAL | Status: DC | PRN
  Administered 2021-02-14 – 2021-02-19 (×12): 1 via ORAL
  Filled 2021-02-14 (×12): qty 1

## 2021-02-14 MED ORDER — ASPIRIN EC 81 MG PO TBEC
81.0000 mg | DELAYED_RELEASE_TABLET | Freq: Every day | ORAL | Status: DC
  Administered 2021-02-15 – 2021-02-23 (×8): 81 mg via ORAL
  Filled 2021-02-14 (×8): qty 1

## 2021-02-14 MED ORDER — LURASIDONE HCL 20 MG PO TABS
20.0000 mg | ORAL_TABLET | Freq: Every day | ORAL | Status: DC
  Administered 2021-02-15 – 2021-02-23 (×8): 20 mg via ORAL
  Filled 2021-02-14 (×9): qty 1

## 2021-02-14 MED ORDER — INSULIN GLARGINE-YFGN 100 UNIT/ML ~~LOC~~ SOLN
10.0000 [IU] | Freq: Every day | SUBCUTANEOUS | Status: DC
  Administered 2021-02-15 – 2021-02-22 (×9): 10 [IU] via SUBCUTANEOUS
  Filled 2021-02-14 (×12): qty 0.1

## 2021-02-14 MED ORDER — INSULIN ASPART 100 UNIT/ML IJ SOLN
0.0000 [IU] | Freq: Three times a day (TID) | INTRAMUSCULAR | Status: DC
  Administered 2021-02-15 – 2021-02-23 (×10): 2 [IU] via SUBCUTANEOUS

## 2021-02-14 MED ORDER — CLOPIDOGREL BISULFATE 75 MG PO TABS: 75.0000 mg | ORAL_TABLET | Freq: Every day | ORAL | Status: DC

## 2021-02-14 MED ORDER — MORPHINE SULFATE (PF) 4 MG/ML IV SOLN
4.0000 mg | Freq: Once | INTRAVENOUS | Status: AC
  Administered 2021-02-14: 4 mg via INTRAVENOUS
  Filled 2021-02-14: qty 1

## 2021-02-14 MED ORDER — VANCOMYCIN HCL 1250 MG/250ML IV SOLN
1250.0000 mg | INTRAVENOUS | Status: DC
  Administered 2021-02-15: 1250 mg via INTRAVENOUS
  Filled 2021-02-14 (×3): qty 250

## 2021-02-14 MED ORDER — FENTANYL CITRATE PF 50 MCG/ML IJ SOSY
50.0000 ug | PREFILLED_SYRINGE | Freq: Once | INTRAMUSCULAR | Status: AC
  Administered 2021-02-17: 50 ug via INTRAVENOUS
  Filled 2021-02-14: qty 1

## 2021-02-14 MED ORDER — SERTRALINE HCL 50 MG PO TABS
150.0000 mg | ORAL_TABLET | Freq: Every day | ORAL | Status: DC
  Administered 2021-02-15 – 2021-02-23 (×8): 150 mg via ORAL
  Filled 2021-02-14 (×8): qty 1

## 2021-02-14 NOTE — ED Provider Notes (Signed)
[facility] EMERGENCY DEPARTMENT Provider Note   CSN: [id number] Arrival date & time: 02/14/21  1143     History Chief Complaint  Patient presents with   Knee Pain    Adam Perez is a 65 y.o. male.   Knee Pain Associated symptoms: no fever        Adam Perez is a 65 y.o. male with past medical history of hypertension, CVA with left sided spastic hemiparesis, and type 2 diabetes who presents to the Emergency Department complaining of pain of his left hip and open malodorous wound of his left lower leg and the toes of the left foot.  He states the wounds to his toes have been present for approximately 2 months.  Noticed gradually increasing wound to his lower leg x4 to 5 weeks.  Left hip pain has been present for several days.  No recent fall.  He is followed by the [place] [facility].   He states he has not been evaluated for the wounds of his foot or leg as he was trying to hold off being seen until after the holidays.  He denies any recent fever, chills, numbness of his lower leg and foot, abdominal pain, nausea or vomiting.  No chest pain or shortness of breath.     Past Medical History:  Diagnosis Date   Diabetes mellitus without complication ([place])    Hemiparesis ([place])    left side; residual from prior acute ischemic CVA   HTN (hypertension)    Ischemic cerebrovascular accident (CVA) ([place])    with residual left hemiparesis    Patient Active Problem List   Diagnosis Date Noted   Hyperglycemia 07/06/2019   History of ischemic cerebrovascular accident (CVA) with residual deficit 07/06/2019   HTN (hypertension) 07/06/2019   Diabetes mellitus type 2, uncontrolled 07/06/2019   DKA (diabetic ketoacidoses) 07/05/2019   Spastic hemiparesis of left dominant side ([place]) 05/13/2016   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 No pertinent surgical history.     Family History  Problem Relation Age of Onset   Diabetes Brother     Diabetes Brother     Social History   Tobacco Use   Smoking status: Never   Smokeless tobacco: Never  Substance Use Topics   Alcohol use: No   Drug use: Never    Home Medications Prior to Admission medications   Medication Sig Start Date End Date Taking? Authorizing Provider  acetaminophen (TYLENOL) 325 MG tablet Take 2 tablets (650 mg total) by mouth every 6 (six) hours as needed for mild pain (or Fever >/= 101). 07/06/19   Emokpae, Courage, MD  amLODipine (NORVASC) 10 MG tablet Take 1 tablet (10 mg total) by mouth daily. 07/06/19   Roxan Hockey, MD  aspirin EC 81 MG tablet Take 1 tablet (81 mg total) by mouth daily with breakfast. 07/06/19   Denton Brick, Courage, MD  atorvastatin (LIPITOR) 40 MG tablet Take 1 tablet (40 mg total) by mouth daily. 07/06/19   Roxan Hockey, MD  baclofen (LIORESAL) 20 MG tablet Take 1 tablet (20 mg total) by mouth 3 (three) times daily. 07/06/19   Roxan Hockey, MD  blood glucose meter kit and supplies Relion Prime or Dispense other brand based on patient and insurance preference. Use up to four times daily as directed. (FOR ICD-9 250.00, 250.01). 07/06/19   Roxan Hockey, MD  blood glucose meter kit and supplies Dispense based on patient and insurance preference. Use up to four  times daily as directed. (FOR ICD-9 250.00, 250.01). 07/06/19   Roxan Hockey, MD  clopidogrel (PLAVIX) 75 MG tablet Take 1 tablet (75 mg total) by mouth daily. 07/06/19   Roxan Hockey, MD  glimepiride (AMARYL) 2 MG tablet Take 1 tablet (2 mg total) by mouth 2 (two) times daily with a meal. 07/06/19 07/05/20  Emokpae, Courage, MD  guaiFENesin (ROBITUSSIN) 100 MG/5ML liquid Take 10 mLs (200 mg total) by mouth 3 (three) times daily as needed for cough. 07/06/19   Roxan Hockey, MD  insulin glargine (LANTUS) 100 UNIT/ML Solostar Pen Inject 10 Units into the skin at bedtime. 07/06/19   Roxan Hockey, MD  Insulin Pen Needle (PEN NEEDLES 3/16") 31G X 5 MM MISC 1 Units by Does not  apply route at bedtime. Use it to administer insulin 07/06/19   Roxan Hockey, MD  lisinopril (ZESTRIL) 20 MG tablet Take 1 tablet (20 mg total) by mouth daily. 07/06/19   Roxan Hockey, MD  lurasidone (LATUDA) 20 MG TABS tablet Take 1 tablet (20 mg total) by mouth daily. 07/06/19   Roxan Hockey, MD  metFORMIN (GLUCOPHAGE) 1000 MG tablet Take 1 tablet (1,000 mg total) by mouth 2 (two) times daily with a meal. 07/06/19 07/05/20  Roxan Hockey, MD  sertraline (ZOLOFT) 100 MG tablet Take 1.5 tablets (150 mg total) by mouth daily. 07/06/19   Roxan Hockey, MD    Allergies    Patient has no known allergies.  Review of Systems   Review of Systems  Constitutional:  Negative for appetite change, chills and fever.  Respiratory:  Negative for shortness of breath.   Cardiovascular:  Negative for chest pain.  Gastrointestinal:  Negative for abdominal pain, diarrhea and vomiting.  Genitourinary:  Negative for difficulty urinating and dysuria.  Musculoskeletal:  Positive for arthralgias (Left hip pain).  Skin:  Positive for color change and wound.       Open wounds to the lateral left lower leg and toes of the left foot.  Neurological:  Negative for dizziness, weakness, numbness and headaches.  Psychiatric/Behavioral:  Negative for confusion.   All other systems reviewed and are negative.  Physical Exam Updated Vital Signs BP (!) 138/91 (BP Location: Right Arm)   Pulse 91   Temp (!) 97.5 F (36.4 C) (Oral)   Resp 17   SpO2 100%   Physical Exam Vitals and nursing note reviewed.  Constitutional:      Appearance: Normal appearance. He is not toxic-appearing.     Comments: Patient is anxious appearing  HENT:     Mouth/Throat:     Mouth: Mucous membranes are moist.  Neck:     Thyroid: No thyromegaly.     Meningeal: Kernig's sign absent.  Cardiovascular:     Rate and Rhythm: Normal rate and regular rhythm.     Pulses: Normal pulses.     Comments: Dorsalis pedis pulses of the left  foot heard with portable Doppler Pulmonary:     Effort: Pulmonary effort is normal.     Breath sounds: Normal breath sounds. No wheezing.  Abdominal:     Palpations: Abdomen is soft.     Tenderness: There is no abdominal tenderness.  Musculoskeletal:        General: Swelling present. Normal range of motion.     Cervical back: Normal range of motion and neck supple.     Right lower leg: Edema present.     Left lower leg: Edema present.     Comments: Left lower leg is discolored  and indurated to the level just distal of the knee.  There is a open, draining malodorous wound to the lateral aspect of the lower leg.  Left great and second toes are also draining and necrotic appearing.  Patient has tenderness to palpation of the anterior and lateral left hip as well.  No induration or erythema of the hip.  See attached photos  Skin:    General: Skin is warm.     Findings: No rash.  Neurological:     General: No focal deficit present.     Mental Status: He is alert.     Sensory: No sensory deficit.     Motor: No weakness.        Patient gave verbal consent for images to be stored in medical record.   ED Results / Procedures / Treatments   Labs (all labs ordered are listed, but only abnormal results are displayed) Labs Reviewed  CBC WITH DIFFERENTIAL/PLATELET - Abnormal; Notable for the following components:      Result Value   RBC 3.72 (*)    Hemoglobin 10.7 (*)    HCT 33.3 (*)    RDW 16.7 (*)    All other components within normal limits  COMPREHENSIVE METABOLIC PANEL - Abnormal; Notable for the following components:   Potassium 5.2 (*)    Glucose, Bld 122 (*)    BUN 26 (*)    Creatinine, Ser 1.31 (*)    Total Protein 9.1 (*)    All other components within normal limits  LACTIC ACID, PLASMA - Abnormal; Notable for the following components:   Lactic Acid, Venous 2.5 (*)    All other components within normal limits  LACTIC ACID, PLASMA - Abnormal; Notable for the following  components:   Lactic Acid, Venous 2.4 (*)    All other components within normal limits  CULTURE, BLOOD (ROUTINE X 2)  CULTURE, BLOOD (ROUTINE X 2)  AEROBIC/ANAEROBIC CULTURE W GRAM STAIN (SURGICAL/DEEP WOUND)  RESP PANEL BY RT-PCR (FLU A&B, COVID) [id number]  PROTIME-INR    EKG None  Radiology DG Tibia/Fibula Left  Result Date: 02/14/2021 CLINICAL DATA:  pain, open wound lower leg, hx DM EXAM: LEFT TIBIA AND FIBULA - 2 VIEW COMPARISON:  None. FINDINGS: There is a soft tissue wound along the lateral lower leg. Diffuse soft tissue swelling. There is mild periostitis along the lateral aspect of the distal fibula. No acute fracture. IMPRESSION: Diffuse lower leg soft tissue swelling with large lateral lower leg soft tissue wound. Adjacent mild periostitis of the lateral aspect of the distal fibula, concerning for possible developing bone infection. Electronically Signed   By: Maurine Simmering M.D.   On: 02/14/2021 14:36   DG Foot Complete Left  Result Date: 02/14/2021 CLINICAL DATA:  pain, open wound lower leg, hx DM EXAM: LEFT FOOT - COMPLETE 3+ VIEW COMPARISON:  None. FINDINGS: Diffuse soft tissue swelling of the foot. There is no evidence of acute fracture. Vascular calcifications. Mild midfoot arthritis. No frank bony destruction. IMPRESSION: Diffuse soft tissue swelling without evidence of acute osseous abnormality in the left foot. Electronically Signed   By: Maurine Simmering M.D.   On: 02/14/2021 14:38   DG Hip Unilat W or Wo Pelvis 2-3 Views Left  Result Date: 02/14/2021 CLINICAL DATA:  Pain.  Open wound lower leg EXAM: DG HIP (WITH OR WITHOUT PELVIS) 2-3V LEFT COMPARISON:  None. FINDINGS: Hip joints and SI joints symmetric and unremarkable. Degenerative changes in the visualized lower lumbar spine. No acute bony abnormality.  Specifically, no fracture, subluxation, or dislocation. Soft tissues are intact. IMPRESSION: No acute bony abnormality. Electronically Signed   By: Rolm Baptise M.D.   On:  02/14/2021 14:33    Procedures Procedures   Medications Ordered in ED Medications  vancomycin (VANCOREADY) IVPB 2000 mg/400 mL (2,000 mg Intravenous New Bag/Given 02/14/21 1446)  fentaNYL (SUBLIMAZE) injection 50 mcg (has no administration in time range)  fentaNYL (SUBLIMAZE) injection 50 mcg (50 mcg Intravenous Given 02/14/21 1336)  sodium chloride 0.9 % bolus 1,000 mL (1,000 mLs Intravenous New Bag/Given 02/14/21 1445)    ED Course  I have reviewed the triage vital signs and the nursing notes.  Pertinent labs & imaging results that were available during my care of the patient were reviewed by me and considered in my medical decision making (see chart for details).    MDM Rules/Calculators/A&P                           Patient here with history of type 2 diabetes, takes insulin at night.  Here for evaluation of left hip pain.  He also notes having nonhealing wound of his left lower leg and toes of the left foot.  No fever or chills.  On exam, patient has malodorous open wounds with purulent drainage noted.  Left lower leg and foot are  indurated.  DP pulse heard by me with Doppler.  Extremity is warm.  Labs interpreted by me, no leukocytosis, hemoglobin 10.7.  Chemistry show mild hyper per kalemia at 5.2.  Kidney functions are elevated with BUN at 26 and creatinine of 1.3 his lactic acid is elevated at 2.5.  I feel this is likely secondary to dehydration.  Doubt sepsis given clinical picture is without leukocytosis, hypotension or fever.  IV fluids have been ordered and IV vancomycin.  He will need hospital admission.  He is nontoxic-appearing. X-ray of the hip and foot without bony findings.  There is likely developing osteomyelitis of the distal left fibula.  Discussed findings with [facility] hospitalist, Dr. Waldron Labs who request consultation with general surgery.  Consulted with general surgery, Dr. Constance Haw who recommends ABI and MRI and if pt requires revascularization,  he will likely  need admission to [facility]  Discussed findings and recommendations again with [facility] hospitalist, Dr. Waldron Labs who agrees to admit  Final Clinical Impression(s) / ED Diagnoses Final diagnoses:  Infected wound  Dehydration    Rx / DC Orders ED Discharge Orders     None        Bufford Lope 02/14/21 1633    Luna Fuse, MD 02/16/21 1625

## 2021-02-14 NOTE — Progress Notes (Signed)
Pharmacy Antibiotic Note  Adam Perez is a 65 y.o. male admitted on 02/14/2021 with diabetic foot infection.  Pharmacy has been consulted for Vancomycin dosing.  Plan: Vancomycin 2000 mg IV x 1 dose. Vancomycin 1250 mg IV every 24 hours Monitor labs, c/s, and vanco level as indicated.     Temp (24hrs), Avg:97.5 F (36.4 C), Min:97.5 F (36.4 C), Max:97.5 F (36.4 C)  Recent Labs  Lab 02/14/21 1317 02/14/21 1510  WBC 7.7  --   CREATININE 1.31*  --   LATICACIDVEN 2.5* 2.4*    CrCl cannot be calculated (Unknown ideal weight.).    No Known Allergies  Antimicrobials this admission: Vanco 12/7 >> CTX 12/7 >> Flagyl 12/7 >>   Microbiology results: 12/7 BCx: pending 12/7 Wound Cx: pending   Thank you for allowing pharmacy to be a part of this patient's care.  Tad Moore 02/14/2021 5:01 PM

## 2021-02-14 NOTE — H&P (Signed)
TRH H&P   Patient Demographics:    Adam Perez, is a 65 y.o. male  MRN: [id number]   DOB - 1955/08/31  Admit Date - 02/14/2021  Outpatient Primary MD for the patient is [facility], [place]  Referring MD/NP/PA: PA [place]  [facility]  Patient coming from: Home  Chief Complaint  Patient presents with   Knee Pain      HPI:    Adam Perez  is a 65 y.o. male, with past medical history of hypertension, CVA with left-sided spastic hemiparesis, type 2 diabetes mellitus insulin-dependent, patient presents to ED due to complaints of left sided hip pain, and malodorous wounds in the left lower extremities including the toes and left foot, patient report wound has been progressive over the last 67-month noted wound has been gradually worsening over the last 4 to 5 weeks, as well he does report left hip pain over the last several days, he denies any recent fall, fever or chills, [facility] he has not been evaluated by his wounds, was holding off hoping he can postpone being seen until after the holidays, he denies fever, chills, GI or vomiting. - in ED patient lactic acid was elevated at 2.4, creatinine at 1.3, white blood cell count within normal limit at 7.7, [facility] hospitalist consulted to admit.   Review of systems:    In addition to the HPI above, No Fever-chills, No Headache, No changes with Vision or hearing, No problems swallowing food or Liquids, No Chest pain, Cough or Shortness of Breath, No Abdominal pain, No Nausea or Vommitting, Bowel movements are regular, No Blood in stool or Urine, No dysuria,  Patient complains of left hip pain. No new weakness, tingling, numbness in any extremity, No recent weight gain or loss, No polyuria, polydypsia or polyphagia, No significant Mental Stressors.  A full 10  point Review of Systems was done, except as stated above, all other Review of Systems were negative.   With Past History of the following :    Past Medical History:  Diagnosis Date   Di[place] mellitus without complication ([place]    [place]    left side; residual from prior acute ischemic CVA   HTN (hypertension)    Ischemic [place] ([place]) [place]    with residual left hemiparesis      History reviewed. No pertinent surgical history.    Social History:     Social History   Tobacco Use   Smoking status: Never   Smokeless tobacco: Never  Substance Use Topics   Alcohol use: No       Family History :     Family History  Problem Relation Age of Onset   Diabetes Brother    Diabetes Brother      Home Medications:   Prior to Admission medications   Medication Sig Start Date  End Date Taking? Authorizing Provider  acetaminophen (TYLENOL) 325 MG tablet Take 2 tablets (650 mg total) by mouth every 6 (six) hours as needed for mild pain (or Fever >/= 101). 07/06/19   Emokpae, Courage, MD  amLODipine (NORVASC) 10 MG tablet Take 1 tablet (10 mg total) by mouth daily. 07/06/19   Roxan Hockey, MD  aspirin EC 81 MG tablet Take 1 tablet (81 mg total) by mouth daily with breakfast. 07/06/19   Denton Brick, Courage, MD  atorvastatin (LIPITOR) 40 MG tablet Take 1 tablet (40 mg total) by mouth daily. 07/06/19   Roxan Hockey, MD  baclofen (LIORESAL) 20 MG tablet Take 1 tablet (20 mg total) by mouth 3 (three) times daily. 07/06/19   Roxan Hockey, MD  blood glucose meter kit and supplies Relion Prime or Dispense other brand based on patient and insurance preference. Use up to four times daily as directed. (FOR ICD-9 250.00, 250.01). 07/06/19   Roxan Hockey, MD  blood glucose meter kit and supplies Dispense based on patient and insurance preference. Use up to four times daily as directed. (FOR ICD-9 250.00, 250.01). 07/06/19   Roxan Hockey, MD  clopidogrel  (PLAVIX) 75 MG tablet Take 1 tablet (75 mg total) by mouth daily. 07/06/19   Roxan Hockey, MD  glimepiride (AMARYL) 2 MG tablet Take 1 tablet (2 mg total) by mouth 2 (two) times daily with a meal. 07/06/19 07/05/20  Emokpae, Courage, MD  guaiFENesin (ROBITUSSIN) 100 MG/5ML liquid Take 10 mLs (200 mg total) by mouth 3 (three) times daily as needed for cough. 07/06/19   Roxan Hockey, MD  insulin glargine (LANTUS) 100 UNIT/ML Solostar Pen Inject 10 Units into the skin at bedtime. 07/06/19   Roxan Hockey, MD  Insulin Pen Needle (PEN NEEDLES 3/16") 31G X 5 MM MISC 1 Units by Does not apply route at bedtime. Use it to administer insulin 07/06/19   Roxan Hockey, MD  lisinopril (ZESTRIL) 20 MG tablet Take 1 tablet (20 mg total) by mouth daily. 07/06/19   Roxan Hockey, MD  lurasidone (LATUDA) 20 MG TABS tablet Take 1 tablet (20 mg total) by mouth daily. 07/06/19   Roxan Hockey, MD  metFORMIN (GLUCOPHAGE) 1000 MG tablet Take 1 tablet (1,000 mg total) by mouth 2 (two) times daily with a meal. 07/06/19 07/05/20  Roxan Hockey, MD  sertraline (ZOLOFT) 100 MG tablet Take 1.5 tablets (150 mg total) by mouth daily. 07/06/19   Roxan Hockey, MD     Allergies:    No Known Allergies   Physical Exam:   Vitals  Blood pressure (!) 144/87, pulse 91, temperature (!) 97.5 F (36.4 C), temperature source Oral, resp. rate 20, SpO2 100 %.   1. General alert male, laying in bed in discomfort secondary to left hip pain and left side spasms  2. Normal affect and insight, Not Suicidal or Homicidal, Awake Alert, Oriented X 3.  3. No F.N deficits, ALL C.Nerves Intact, Strength 5/5 all 4 extremities, Sensation intact all 4 extremities, Plantars down going.  4. Ears and Eyes appear Normal, Conjunctivae clear, PERRLA. Moist Oral Mucosa.  5. Supple Neck, No JVD, No cervical lymphadenopathy appriciated, No Carotid Bruits.  6. Symmetrical Chest wall movement, Good air movement bilaterally, CTAB.  7.  RRR, No Gallops, Rubs or Murmurs, No Parasternal Heave.  8. Positive Bowel Sounds, Abdomen Soft, No tenderness, No organomegaly appriciated,No rebound -guarding or rigidity.  9.  Clinically skin changes with thickening of the skin, extensive ulcers in the left lateral side of lower extremity  and toes, please see pictures below, patient with diminished pulses in the left side as well.  10. Good muscle tone,  joints appear normal , no effusions, left Side weakness  11. No Palpable Lymph Nodes in Neck or Axillae       Data Review:    CBC Recent Labs  Lab 02/14/21 1317  WBC 7.7  HGB 10.7*  HCT 33.3*  PLT 362  MCV 89.5  MCH 28.8  MCHC 32.1  RDW 16.7*  LYMPHSABS 1.2  MONOABS 0.5  EOSABS 0.0  BASOSABS 0.1   ------------------------------------------------------------------------------------------------------------------  Chemistries  Recent Labs  Lab 02/14/21 1317  NA 138  K 5.2*  CL 106  CO2 23  GLUCOSE 122*  BUN 26*  CREATININE 1.31*  CALCIUM 9.5  AST 36  ALT 26  ALKPHOS 82  BILITOT 1.2   ------------------------------------------------------------------------------------------------------------------ CrCl cannot be calculated (Unknown ideal weight.). ------------------------------------------------------------------------------------------------------------------ No results for input(s): TSH, T4TOTAL, T3FREE, THYROIDAB in the last 72 hours.  Invalid input(s): FREET3  Coagulation profile Recent Labs  Lab 02/14/21 1344  INR 1.1   ------------------------------------------------------------------------------------------------------------------- No results for input(s): DDIMER in the last 72 hours. -------------------------------------------------------------------------------------------------------------------  Cardiac Enzymes No results for input(s): CKMB, TROPONINI, MYOGLOBIN in the last 168 hours.  Invalid input(s):  CK ------------------------------------------------------------------------------------------------------------------ No results found for: BNP   ---------------------------------------------------------------------------------------------------------------  Urinalysis    Component Value Date/Time   COLORURINE STRAW (A) 07/05/2019 1557   APPEARANCEUR CLEAR 07/05/2019 1557   LABSPEC 1.017 07/05/2019 1557   PHURINE 5.0 07/05/2019 1557   GLUCOSEU >=500 (A) 07/05/2019 1557   HGBUR NEGATIVE 07/05/2019 1557   BILIRUBINUR NEGATIVE 07/05/2019 1557   KETONESUR 20 (A) 07/05/2019 1557   PROTEINUR NEGATIVE 07/05/2019 1557   NITRITE NEGATIVE 07/05/2019 1557   LEUKOCYTESUR NEGATIVE 07/05/2019 1557    ----------------------------------------------------------------------------------------------------------------   Imaging Results:    DG Tibia/Fibula Left  Result Date: 02/14/2021 CLINICAL DATA:  pain, open wound lower leg, hx DM EXAM: LEFT TIBIA AND FIBULA - 2 VIEW COMPARISON:  None. FINDINGS: There is a soft tissue wound along the lateral lower leg. Diffuse soft tissue swelling. There is mild periostitis along the lateral aspect of the distal fibula. No acute fracture. IMPRESSION: Diffuse lower leg soft tissue swelling with large lateral lower leg soft tissue wound. Adjacent mild periostitis of the lateral aspect of the distal fibula, concerning for possible developing bone infection. Electronically Signed   By: Maurine Simmering M.D.   On: 02/14/2021 14:36   DG Foot Complete Left  Result Date: 02/14/2021 CLINICAL DATA:  pain, open wound lower leg, hx DM EXAM: LEFT FOOT - COMPLETE 3+ VIEW COMPARISON:  None. FINDINGS: Diffuse soft tissue swelling of the foot. There is no evidence of acute fracture. Vascular calcifications. Mild midfoot arthritis. No frank bony destruction. IMPRESSION: Diffuse soft tissue swelling without evidence of acute osseous abnormality in the left foot. Electronically Signed   By:  Maurine Simmering M.D.   On: 02/14/2021 14:38   DG Hip Unilat W or Wo Pelvis 2-3 Views Left  Result Date: 02/14/2021 CLINICAL DATA:  Pain.  Open wound lower leg EXAM: DG HIP (WITH OR WITHOUT PELVIS) 2-3V LEFT COMPARISON:  None. FINDINGS: Hip joints and SI joints symmetric and unremarkable. Degenerative changes in the visualized lower lumbar spine. No acute bony abnormality. Specifically, no fracture, subluxation, or dislocation. Soft tissues are intact. IMPRESSION: No acute bony abnormality. Electronically Signed   By: Rolm Baptise M.D.   On: 02/14/2021 14:33    My personal review of EKG: Rhythm NSR,  Rate Pending  Assessment & Plan:    Principal Problem:   Infected wound Active Problems:   Spastic hemiparesis of left dominant side (HCC)   History of ischemic cerebrovascular accident (CVA) with residual deficit   Type 2 diabetes mellitus with foot ulcer ([place])   Left lower extremity wound -Infected, likely with underlying bone infection/osteomyelitis, most likely in the setting of peripheral vascular disease. -Infected wound pathway, continue with Rocephin, vancomycin and Flagyl -Cultures were sent in ED, follow-up blood cultures. -Vascular surgery Dr. Luan Pulling was consulted -  there is possibility the left lower extremity lateral above ankle ulcer may be related to vascular insufficiency as well. -Orthopedic surgery Dr. Sharol Given has been consulted. -We will obtain MRI of left tib-fib/foot to rule out osteomyelitis. -Patient complains with left lateral hip pain, tender to palpation on my exam, I will obtain MRI as well in that area to rule out any infectious process.   Diabetes mellitus -Check A1c, resume home regimen of Lantus 10 units at bedtime, will add insulin sliding scale   H/o Prior CVA - with residual left-sided hemiparesis -Continue with aspirin, Plavix and statin   HTN -Blood pressure is soft, hold home medications   Depression - continue with home meds  DVT Prophylaxis  Lovenox  AM Labs Ordered, also please review Full Orders  Family Communication: Admission, patients condition and plan of care including tests being ordered have been discussed with the patient and wife at bedside who indicate understanding and agree with the plan and Code Status.  Code Status Full  Likely DC to  Home  Condition GUARDED    Consults called: ortho Dr Sharol Given, and vascular surgery Dr Luan Pulling    Admission status: inpatient    Time spent in minutes : 45 minutes   Phillips Climes M.D on 02/14/2021 at 4:51 PM   [facility] - Office  [phone]

## 2021-02-14 NOTE — ED Triage Notes (Signed)
Pt c/o left knee pain and is having difficulty walking

## 2021-02-14 NOTE — ED Notes (Signed)
Critical Lab: Lactic Acid 2.5.  Provider made aware.

## 2021-02-14 NOTE — ED Notes (Signed)
Patient transported to MRI 

## 2021-02-15 ENCOUNTER — Encounter (HOSPITAL_COMMUNITY): Payer: Medicare Other

## 2021-02-15 ENCOUNTER — Inpatient Hospital Stay (HOSPITAL_COMMUNITY): Payer: Medicare Other

## 2021-02-15 ENCOUNTER — Encounter (HOSPITAL_COMMUNITY)
Admission: EM | Disposition: A | Discharge status: Rehabilitation Facility | Payer: Self-pay | Source: Home / Self Care | Attending: Student

## 2021-02-15 DIAGNOSIS — I743 Embolism and thrombosis of arteries of the lower extremities: Secondary | ICD-10-CM

## 2021-02-15 DIAGNOSIS — E11621 Type 2 diabetes mellitus with foot ulcer: Secondary | ICD-10-CM

## 2021-02-15 DIAGNOSIS — I96 Gangrene, not elsewhere classified: Secondary | ICD-10-CM | POA: Diagnosis not present

## 2021-02-15 DIAGNOSIS — T148XXA Other injury of unspecified body region, initial encounter: Secondary | ICD-10-CM | POA: Diagnosis not present

## 2021-02-15 DIAGNOSIS — L03116 Cellulitis of left lower limb: Secondary | ICD-10-CM

## 2021-02-15 DIAGNOSIS — F411 Generalized anxiety disorder: Secondary | ICD-10-CM | POA: Insufficient documentation

## 2021-02-15 DIAGNOSIS — F332 Major depressive disorder, recurrent severe without psychotic features: Secondary | ICD-10-CM | POA: Insufficient documentation

## 2021-02-15 DIAGNOSIS — Z7902 Long term (current) use of antithrombotics/antiplatelets: Secondary | ICD-10-CM

## 2021-02-15 DIAGNOSIS — I739 Peripheral vascular disease, unspecified: Secondary | ICD-10-CM

## 2021-02-15 DIAGNOSIS — I10 Essential (primary) hypertension: Secondary | ICD-10-CM

## 2021-02-15 DIAGNOSIS — M86672 Other chronic osteomyelitis, left ankle and foot: Secondary | ICD-10-CM | POA: Diagnosis not present

## 2021-02-15 DIAGNOSIS — Z833 Family history of diabetes mellitus: Secondary | ICD-10-CM

## 2021-02-15 DIAGNOSIS — S069X9A Unspecified intracranial injury with loss of consciousness of unspecified duration, initial encounter: Secondary | ICD-10-CM | POA: Insufficient documentation

## 2021-02-15 DIAGNOSIS — L089 Local infection of the skin and subcutaneous tissue, unspecified: Secondary | ICD-10-CM

## 2021-02-15 DIAGNOSIS — I70245 Atherosclerosis of native arteries of left leg with ulceration of other part of foot: Secondary | ICD-10-CM

## 2021-02-15 DIAGNOSIS — Z794 Long term (current) use of insulin: Secondary | ICD-10-CM

## 2021-02-15 DIAGNOSIS — I87332 Chronic venous hypertension (idiopathic) with ulcer and inflammation of left lower extremity: Secondary | ICD-10-CM

## 2021-02-15 DIAGNOSIS — L97929 Non-pressure chronic ulcer of unspecified part of left lower leg with unspecified severity: Secondary | ICD-10-CM

## 2021-02-15 DIAGNOSIS — L97529 Non-pressure chronic ulcer of other part of left foot with unspecified severity: Secondary | ICD-10-CM

## 2021-02-15 DIAGNOSIS — I69354 Hemiplegia and hemiparesis following cerebral infarction affecting left non-dominant side: Secondary | ICD-10-CM

## 2021-02-15 DIAGNOSIS — Z79899 Other long term (current) drug therapy: Secondary | ICD-10-CM

## 2021-02-15 DIAGNOSIS — Z7982 Long term (current) use of aspirin: Secondary | ICD-10-CM

## 2021-02-15 DIAGNOSIS — I70242 Atherosclerosis of native arteries of left leg with ulceration of calf: Secondary | ICD-10-CM

## 2021-02-15 DIAGNOSIS — E118 Type 2 diabetes mellitus with unspecified complications: Secondary | ICD-10-CM

## 2021-02-15 HISTORY — PX: LOWER EXTREMITY ANGIOGRAPHY: CATH118251

## 2021-02-15 LAB — CBC
HCT: 29.2 % — ABNORMAL LOW (ref 39.0–52.0)
Hemoglobin: 9.6 g/dL — ABNORMAL LOW (ref 13.0–17.0)
MCH: 28.5 pg (ref 26.0–34.0)
MCHC: 32.9 g/dL (ref 30.0–36.0)
MCV: 86.6 fL (ref 80.0–100.0)
Platelets: 394 10*3/uL (ref 150–400)
RBC: 3.37 MIL/uL — ABNORMAL LOW (ref 4.22–5.81)
RDW: 16 % — ABNORMAL HIGH (ref 11.5–15.5)
WBC: 8.4 10*3/uL (ref 4.0–10.5)
nRBC: 0 % (ref 0.0–0.2)

## 2021-02-15 LAB — GLUCOSE, CAPILLARY
Glucose-Capillary: 118 mg/dL — ABNORMAL HIGH (ref 70–99)
Glucose-Capillary: 117 mg/dL — ABNORMAL HIGH (ref 70–99)
Glucose-Capillary: 142 mg/dL — ABNORMAL HIGH (ref 70–99)
Glucose-Capillary: 127 mg/dL — ABNORMAL HIGH (ref 70–99)
Glucose-Capillary: 111 mg/dL — ABNORMAL HIGH (ref 70–99)

## 2021-02-15 LAB — BASIC METABOLIC PANEL
Anion gap: 12 (ref 5–15)
BUN: 17 mg/dL (ref 8–23)
CO2: 22 mmol/L (ref 22–32)
Calcium: 9.1 mg/dL (ref 8.9–10.3)
Chloride: 107 mmol/L (ref 98–111)
Creatinine, Ser: 1.19 mg/dL (ref 0.61–1.24)
GFR, Estimated: >60 mL/min (ref >60)
Glucose, Bld: 145 mg/dL — ABNORMAL HIGH (ref 70–99)
Potassium: 3.6 mmol/L (ref 3.5–5.1)
Sodium: 141 mmol/L (ref 135–145)

## 2021-02-15 LAB — C-REACTIVE PROTEIN: CRP: 1.4 mg/dL — ABNORMAL HIGH (ref <1.0)

## 2021-02-15 LAB — HIV ANTIBODY (ROUTINE TESTING W REFLEX): HIV Screen 4th Generation wRfx: NONREACTIVE

## 2021-02-15 LAB — SEDIMENTATION RATE: Sed Rate: 76 mm/hr — ABNORMAL HIGH (ref 0–16)

## 2021-02-15 SURGERY — LOWER EXTREMITY ANGIOGRAPHY
Anesthesia: LOCAL

## 2021-02-15 MED ORDER — SODIUM CHLORIDE 0.9 % IV SOLN: 250.0000 mL | INTRAVENOUS | Status: DC | PRN

## 2021-02-15 MED ORDER — HEPARIN (PORCINE) IN NACL 1000-0.9 UT/500ML-% IV SOLN
INTRAVENOUS | Status: AC
  Filled 2021-02-15: qty 1000

## 2021-02-15 MED ORDER — PROSOURCE PLUS PO LIQD
30.0000 mL | Freq: Two times a day (BID) | ORAL | Status: DC
  Administered 2021-02-16 – 2021-02-23 (×14): 30 mL via ORAL
  Filled 2021-02-15 (×14): qty 30

## 2021-02-15 MED ORDER — SODIUM CHLORIDE 0.9% FLUSH: 3.0000 mL | INTRAVENOUS | Status: DC | PRN

## 2021-02-15 MED ORDER — MIDAZOLAM HCL 2 MG/2ML IJ SOLN
INTRAMUSCULAR | Status: DC | PRN
  Administered 2021-02-15: 2 mg via INTRAVENOUS

## 2021-02-15 MED ORDER — ADULT MULTIVITAMIN W/MINERALS CH
1.0000 | ORAL_TABLET | Freq: Every day | ORAL | Status: DC
  Administered 2021-02-16 – 2021-02-23 (×7): 1 via ORAL
  Filled 2021-02-15 (×7): qty 1

## 2021-02-15 MED ORDER — HYDRALAZINE HCL 20 MG/ML IJ SOLN: 5.0000 mg | INTRAMUSCULAR | Status: DC | PRN

## 2021-02-15 MED ORDER — FENTANYL CITRATE (PF) 100 MCG/2ML IJ SOLN
INTRAMUSCULAR | Status: AC
  Filled 2021-02-15: qty 2

## 2021-02-15 MED ORDER — ONDANSETRON HCL 4 MG/2ML IJ SOLN: 4.0000 mg | Freq: Four times a day (QID) | INTRAMUSCULAR | Status: DC | PRN

## 2021-02-15 MED ORDER — SODIUM CHLORIDE 0.9% FLUSH
3.0000 mL | Freq: Two times a day (BID) | INTRAVENOUS | Status: DC
  Administered 2021-02-16 – 2021-02-18 (×5): 3 mL via INTRAVENOUS

## 2021-02-15 MED ORDER — SODIUM CHLORIDE 0.9 % IV SOLN: INTRAVENOUS | Status: DC

## 2021-02-15 MED ORDER — MIDAZOLAM HCL 2 MG/2ML IJ SOLN
INTRAMUSCULAR | Status: AC
  Filled 2021-02-15: qty 2

## 2021-02-15 MED ORDER — FENTANYL CITRATE (PF) 100 MCG/2ML IJ SOLN
INTRAMUSCULAR | Status: DC | PRN
  Administered 2021-02-15 (×2): 50 ug via INTRAVENOUS

## 2021-02-15 MED ORDER — SODIUM CHLORIDE 0.9 % WEIGHT BASED INFUSION: 1.0000 mL/kg/h | INTRAVENOUS | Status: AC

## 2021-02-15 MED ORDER — HEPARIN (PORCINE) IN NACL 1000-0.9 UT/500ML-% IV SOLN
INTRAVENOUS | Status: DC | PRN
  Administered 2021-02-15 (×2): 500 mL

## 2021-02-15 MED ORDER — HEPARIN SODIUM (PORCINE) 1000 UNIT/ML IJ SOLN
INTRAMUSCULAR | Status: AC
  Filled 2021-02-15: qty 10

## 2021-02-15 MED ORDER — LIDOCAINE HCL (PF) 1 % IJ SOLN
INTRAMUSCULAR | Status: DC | PRN
  Administered 2021-02-15: 15 mL via INTRADERMAL

## 2021-02-15 MED ORDER — TRAZODONE HCL 100 MG PO TABS
100.0000 mg | ORAL_TABLET | Freq: Every day | ORAL | Status: DC
  Administered 2021-02-15 – 2021-02-22 (×8): 100 mg via ORAL
  Filled 2021-02-15 (×8): qty 1

## 2021-02-15 MED ORDER — ACETAMINOPHEN 325 MG PO TABS
650.0000 mg | ORAL_TABLET | ORAL | Status: DC | PRN
  Filled 2021-02-15: qty 2

## 2021-02-15 MED ORDER — AMLODIPINE BESYLATE 10 MG PO TABS
10.0000 mg | ORAL_TABLET | Freq: Every day | ORAL | Status: DC
  Administered 2021-02-15 – 2021-02-23 (×8): 10 mg via ORAL
  Filled 2021-02-15 (×8): qty 1

## 2021-02-15 MED ORDER — IODIXANOL 320 MG/ML IV SOLN
INTRAVENOUS | Status: DC | PRN
  Administered 2021-02-15: 57 mL via INTRA_ARTERIAL

## 2021-02-15 MED ORDER — LABETALOL HCL 5 MG/ML IV SOLN: 10.0000 mg | INTRAVENOUS | Status: DC | PRN

## 2021-02-15 MED ORDER — LIDOCAINE HCL (PF) 1 % IJ SOLN
INTRAMUSCULAR | Status: AC
  Filled 2021-02-15: qty 30

## 2021-02-15 SURGICAL SUPPLY — 13 items
CATH OMNI FLUSH 5F 65CM (CATHETERS) ×2 IMPLANT
CATH SOFT-VU 4F 65 STRAIGHT (CATHETERS) ×1 IMPLANT
CATH SOFT-VU STRAIGHT 4F 65CM (CATHETERS) ×2
DEVICE CLOSURE MYNXGRIP 5F (Vascular Products) ×2 IMPLANT
GUIDEWIRE ANGLED .035X150CM (WIRE) ×2 IMPLANT
KIT MICROPUNCTURE NIT STIFF (SHEATH) ×2 IMPLANT
KIT PV (KITS) ×2 IMPLANT
SHEATH PINNACLE 5F 10CM (SHEATH) ×2 IMPLANT
SHEATH PROBE COVER 6X72 (BAG) ×2 IMPLANT
SYR MEDRAD MARK V 150ML (SYRINGE) ×2 IMPLANT
TRANSDUCER W/STOPCOCK (MISCELLANEOUS) ×2 IMPLANT
TRAY PV CATH (CUSTOM PROCEDURE TRAY) ×2 IMPLANT
WIRE BENTSON .035X145CM (WIRE) ×2 IMPLANT

## 2021-02-15 NOTE — Progress Notes (Signed)
Pt arrived to unit from cath lab. A/O x 4,  CCMD called ,CHG given, pt oriented to unit,Will continue to monitor.   Karna Christmas Irasema Chalk, RN    02/15/21 1439  Vitals  Temp 98.3 F (36.8 C)  Temp Source Oral  BP 130/77  MAP (mmHg) 94  BP Location Right Arm  BP Method Automatic  Patient Position (if appropriate) Lying  Pulse Rate 99  Pulse Rate Source Monitor  ECG Heart Rate 98  Resp (!) 21  Level of Consciousness  Level of Consciousness Alert  Oxygen Therapy  SpO2 100 %  O2 Device Room Air  Pain Assessment  Pain Scale 0-10  Pain Score Asleep  Height and Weight  Height 5\' 7"  (1.702 m)  Weight 109.3 kg  Type of Scale Used Bed  Type of Weight Stated  BSA (Calculated - sq m) 2.27 sq meters  BMI (Calculated) 37.73  Weight in (lb) to have BMI = 25 159.3  MEWS Score  MEWS Temp 0  MEWS Systolic 0  MEWS Pulse 0  MEWS RR 1  MEWS LOC 0  MEWS Score 1  MEWS Score Color Green

## 2021-02-15 NOTE — Progress Notes (Signed)
Initial Nutrition Assessment  DOCUMENTATION CODES:   Not applicable  INTERVENTION:   Prosource Plus 30 ml PO BID, each packet provides 100 kcal and 15 gm protein. MVI with minerals daily.  NUTRITION DIAGNOSIS:   Increased nutrient needs related to wound healing (LLE wound) as evidenced by estimated needs.  GOAL:   Patient will meet greater than or equal to 90% of their needs  MONITOR:   PO intake, Supplement acceptance, Labs, Skin  REASON FOR ASSESSMENT:   Consult Wound healing  ASSESSMENT:   65 y.o. male presented to the ED w/ L hip pain and malodorous wounds on LLE. PMH includes T2DM, HTN, and PVD. Pt admitted with infected LLE wound.  Patient reports good intake at home PTA. He is nervous about upcoming surgery. Discussed importance of adequate intake of calories and protein to support healing. We reviewed good dietary sources of protein. He agreed to try a protein supplement to ensure adequate protein intake as well as a multivitamin.   Labs reviewed.  CBG: [id number]  Medications reviewed and include Novolog, Semglee.   Weight history reviewed. No significant weight changes noted. Patient reports good intake and stable weight at home.   NUTRITION - FOCUSED PHYSICAL EXAM:  Flowsheet Row Most Recent Value  Orbital Region No depletion  Upper Arm Region No depletion  Thoracic and Lumbar Region No depletion  Buccal Region No depletion  Temple Region No depletion  Clavicle Bone Region No depletion  Clavicle and Acromion Bone Region No depletion  Scapular Bone Region No depletion  Dorsal Hand No depletion  Patellar Region No depletion  Anterior Thigh Region No depletion  Posterior Calf Region No depletion  Edema (RD Assessment) Mild  Hair Reviewed  Eyes Reviewed  Mouth Reviewed  Skin Reviewed  Nails Reviewed       Diet Order:   Diet Order             Diet Carb Modified Fluid consistency: Thin; Room service appropriate? Yes  Diet effective now                    EDUCATION NEEDS:   Education needs have been addressed  Skin:  Skin Assessment: Reviewed RN Assessment (Wound LLE)  Last BM:  12/8  Height:   Ht Readings from Last 1 Encounters:  02/15/21 5\' 7"  (1.702 m)    Weight:   Wt Readings from Last 1 Encounters:  02/15/21 109.3 kg    Ideal Body Weight:  67.3 kg  BMI:  Body mass index is 37.74 kg/m.  Estimated Nutritional Needs:   Kcal:  2100-2300  Protein:  110-130 gm  Fluid:  2.1-2.3 L    14/08/22, RD, LDN, CNSC Please refer to Amion for contact information.

## 2021-02-15 NOTE — Care Management (Signed)
Consult for home health needs. Plan left lower extremity angiogram with possible intervention via right common femoral approach in cath lab today at 12PM  NCM will continue to follow for discharge needs, PT recommendations.

## 2021-02-15 NOTE — Progress Notes (Signed)
Inpatient Diabetes Program Recommendations  AACE/ADA: New Consensus Statement on Inpatient Glycemic Control   Target Ranges:  Prepandial:   less than 140 mg/dL      Peak postprandial:   less than 180 mg/dL (1-2 hours)      Critically ill patients:  140 - 180 mg/dL   Review of Glycemic Control  Latest Reference Range & Units 02/14/21 21:39 02/14/21 23:47 02/15/21 08:40 02/15/21 11:39  Glucose-Capillary 70 - 99 mg/dL 96 209 (H) 470 (H) 962 (H)    Latest Reference Range & Units 02/14/21 13:01  Hemoglobin A1C 4.8 - 5.6 % 6.1 (H)   Diabetes history: DM2 Outpatient Diabetes medications: Amaryl 2 mg BID, Metformin 1000 mg BID, Lantus 10 units QHS Current orders for Inpatient glycemic control: Semglee 10 units QHS, Novolog 0-15 units TID with meals, Novolog 0-5 units QHS  NOTE: Noted consult for diabetes coordinator per Lower Extremity order set. Chart reviewed. Agree with current orders for DM control. Will follow.  Everlena Cooper, RN, MSN, CDE Diabetes Coordinator Inpatient Diabetes Program [phone] (Team Pager from 8am to 5pm)

## 2021-02-15 NOTE — [facility] (Signed)
PROGRESS NOTE  Adam Perez OMB:[id number] DOB: Nov 13, 1955   PCP: [place]  Patient is from: Home.  Lives alone.  Uses cane at baseline.  Gets his care at the [place].  DOA: 02/14/2021 LOS: 1  Chief complaints:  Chief Complaint  Patient presents with   Knee Pain     Brief Narrative / Interim history: 65 year old M with PMH of CVA with left-sided spastic hemiparesis, IDDM-2, PVD, PTSD, GAD and neurocognitive deficits presenting with worsening left hip pain, malodorous wound in left foot and left lower extremity, and found to have infected left foot and leg diabetic wounds with cellulitis and left second and third toe osteomyelitis as noted on MRI.  Patient was started on IV vancomycin, ceftriaxone and Flagyl.  Vascular surgery and orthopedic surgery consulted, and evaluated patient.  Subjective: Seen and examined earlier this morning.  No major events overnight of this morning.  Continues to endorse pain in his left hip, left leg and left foot.  He rates pain 7/10.  No other complaints.  Objective: Vitals:   02/14/21 2150 02/14/21 2249 02/15/21 0443 02/15/21 0842  BP:  (!) 154/90 (!) 152/101 127/82  Pulse:  (!) 105 95 95  Resp:  _0 Temp: 98 F (36.7 C) 97.9 F (36.6 C) 97.9 F (36.6 C)   TempSrc: Oral Oral Oral   SpO2:  100% 100% 100%  Weight: 108.6 kg 109.1 kg    Height: _1  (1.702 m) _2  (1.702 m)      Intake/Output Summary (Last 24 hours) at 02/15/2021 1027 Last data filed at 02/15/2021 0553 Gross per 24 hour  Intake 1400 ml  Output 850 ml  Net 550 ml   Filed Weights   02/14/21 2150 02/14/21 2249  Weight: 108.6 kg 109.1 kg    Examination:  GENERAL: No apparent distress.  Nontoxic. HEENT: MMM.  Vision and hearing grossly intact.  NECK: Supple.  No apparent JVD.  RESP:  No IWOB.  Fair aeration bilaterally. CVS:  RRR. Heart sounds normal.  ABD/GI/GU: BS+. Abd soft, NTND.  MSK/EXT:  Moves extremities.  2+ BLE edema.  Not able to palpate DP or PT  pulses. SKIN: Infected looking wound over dorsal aspect of left second and third toe and adjacent part of his left foot, similar wider wound over lateral aspect of his left leg.  See picture under media for more. NEURO: Awake, alert and oriented appropriately.  No apparent focal neuro deficit other than noted left residual hemiparesis from prior CVA.   PSYCH: Calm. Normal affect.   Procedures:  None  Microbiology summarized: [id number] and influenza PCR nonreactive.  Assessment & Plan: Diabetic infected left foot and left leg infection/cellulitis/myositis Left second and third toe osteomyelitis -MRI suggestive of cellulitis, myositis and osteomyelitis. -Continue Vanco, CTX and Flagyl -Check CRP and ESR -Plan for ABI/Vascular catheterization by vascular surgery -Possible TMT amputation by orthopedic surgery after vascular study -Holding Plavix for now-last dose on 12/6 -On Robaxin, Norco and IV morphine for pain control -Bowel regimen  Left hip pain/osteoarthritis: MRI suggest disorder arthritis. -Pain control     Controlled IDDM-2 with diabetic foot infection/PAD/hyperglycemia: A1c 6.1%. Recent Labs  Lab 02/14/21 2139 02/14/21 2347 02/15/21 0840  GLUCAP 96 123* 118*  -Continue current insulin regimen -Continue home statin.   H/o Prior CVA with residual left-sided spastic hemiparesis -Continue aspirin and statin. -Hold home baclofen while on Robaxin -Hold Plavix   Essential hypertension: Normotensive. -Resume home amlodipine.   PTSD/anxiety/depression/neurocognitive deficit: somewhat anxious  looking. -continue with home meds   Morbid obesity Body mass index is 37.67 kg/m.         DVT prophylaxis:    On subcu Lovenox  Code Status: Full code Family Communication: Updated patient's friend at bedside with patient's permission. Level of care: Med-Surg Status is: Inpatient  Remains inpatient appropriate because: Further evaluation and treatment of infected  diabetic wound with possible peripheral vascular disease    Consultants:  Orthopedic surgery Vascular surgery   Sch Meds:  Scheduled Meds:  aspirin EC  81 mg Oral Q breakfast   atorvastatin  40 mg Oral Daily   enoxaparin (LOVENOX) injection  55 mg Subcutaneous QHS   fentaNYL (SUBLIMAZE) injection  50 mcg Intravenous Once   insulin aspart  0-15 Units Subcutaneous TID WC   insulin aspart  0-5 Units Subcutaneous QHS   insulin glargine-yfgn  10 Units Subcutaneous QHS   lurasidone  20 mg Oral Daily   sertraline  150 mg Oral Daily   Continuous Infusions:  sodium chloride 100 mL/hr at 02/15/21 0945   cefTRIAXone (ROCEPHIN)  IV Stopped (02/14/21 1951)   methocarbamol (ROBAXIN) IV Stopped (02/14/21 2140)   metronidazole 500 mg (02/15/21 0553)   vancomycin     PRN Meds:.HYDROcodone-acetaminophen, methocarbamol (ROBAXIN) IV, morphine injection  Antimicrobials: Anti-infectives (From admission, onward)    Start     Dose/Rate Route Frequency Ordered Stop   02/15/21 1500  vancomycin (VANCOREADY) IVPB 1250 mg/250 mL        1,250 mg 166.7 mL/hr over 90 Minutes Intravenous Every 24 hours 02/14/21 1700     02/14/21 1800  metroNIDAZOLE (FLAGYL) IVPB 500 mg        500 mg 100 mL/hr over 60 Minutes Intravenous Every 12 hours 02/14/21 1649 02/21/21 1759   02/14/21 1700  cefTRIAXone (ROCEPHIN) 2 g in sodium chloride 0.9 % 100 mL IVPB        2 g 200 mL/hr over 30 Minutes Intravenous Every 24 hours 02/14/21 1649 02/21/21 1659   02/14/21 1345  vancomycin (VANCOREADY) IVPB 2000 mg/400 mL        2,000 mg 200 mL/hr over 120 Minutes Intravenous  Once 02/14/21 1331 02/14/21 1655   02/14/21 1330  vancomycin (VANCOCIN) IVPB 1000 mg/200 mL premix  Status:  Discontinued        1,000 mg 200 mL/hr over 60 Minutes Intravenous  Once 02/14/21 1327 02/14/21 1331        I have personally reviewed the following labs and images: CBC: Recent Labs  Lab 02/14/21 1317 02/15/21 0045  WBC 7.7 8.4  NEUTROABS  6.0  --   HGB 10.7* 9.6*  HCT 33.3* 29.2*  MCV 89.5 86.6  PLT 362 394   BMP &GFR Recent Labs  Lab 02/14/21 1317 02/15/21 0045  NA 138 141  K 5.2* 3.6  CL 106 107  CO2 23 22  GLUCOSE 122* 145*  BUN 26* 17  CREATININE 1.31* 1.19  CALCIUM 9.5 9.1   Estimated Creatinine Clearance: 71.9 mL/min (by C-G formula based on SCr of 1.19 mg/dL). Liver & Pancreas: Recent Labs  Lab 02/14/21 1317  AST 36  ALT 26  ALKPHOS 82  BILITOT 1.2  PROT 9.1*  ALBUMIN 4.0   No results for input(s): LIPASE, AMYLASE in the last 168 hours. No results for input(s): AMMONIA in the last 168 hours. Diabetic: Recent Labs    02/14/21 1301  HGBA1C 6.1*   Recent Labs  Lab 02/14/21 2139 02/14/21 2347 02/15/21 0840  GLUCAP 96 123*  118*   Cardiac Enzymes: No results for input(s): CKTOTAL, CKMB, CKMBINDEX, TROPONINI in the last 168 hours. No results for input(s): PROBNP in the last 8760 hours. Coagulation Profile: Recent Labs  Lab 02/14/21 1344  INR 1.1   Thyroid Function Tests: No results for input(s): TSH, T4TOTAL, FREET4, T3FREE, THYROIDAB in the last 72 hours. Lipid Profile: No results for input(s): CHOL, HDL, LDLCALC, TRIG, CHOLHDL, LDLDIRECT in the last 72 hours. Anemia Panel: No results for input(s): VITAMINB12, FOLATE, FERRITIN, TIBC, IRON, RETICCTPCT in the last 72 hours. Urine analysis:    Component Value Date/Time   COLORURINE STRAW (A) 07/05/2019 1557   APPEARANCEUR CLEAR 07/05/2019 1557   LABSPEC 1.017 07/05/2019 1557   PHURINE 5.0 07/05/2019 1557   GLUCOSEU >=500 (A) 07/05/2019 1557   HGBUR NEGATIVE 07/05/2019 1557   BILIRUBINUR NEGATIVE 07/05/2019 1557   KETONESUR 20 (A) 07/05/2019 1557   PROTEINUR NEGATIVE 07/05/2019 1557   NITRITE NEGATIVE 07/05/2019 1557   LEUKOCYTESUR NEGATIVE 07/05/2019 1557   Sepsis Labs: Invalid input(s): PROCALCITONIN, [place]  Microbiology: Recent Results (from the past 240 hour(s))  Culture, blood (routine x 2)     Status: None  (Preliminary result)   Collection Time: 02/14/21  1:24 PM   Specimen: Vein; Blood  Result Value Ref Range Status   Specimen Description BLOOD RIGHT ANTECUBITAL  Final   Special Requests   Final    BOTTLES DRAWN AEROBIC AND ANAEROBIC Blood Culture adequate volume Performed at [facility], [address] [place], [place] [address]    Culture PENDING  Incomplete   Report Status PENDING  Incomplete  Culture, blood (routine x 2)     Status: None (Preliminary result)   Collection Time: 02/14/21  1:44 PM   Specimen: Vein; Blood  Result Value Ref Range Status   Specimen Description BLOOD BLOOD LEFT HAND  Final   Special Requests   Final    BOTTLES DRAWN AEROBIC AND ANAEROBIC Blood Culture adequate volume Performed at [facility], [address] [place], [place] [address]    Culture PENDING  Incomplete   Report Status PENDING  Incomplete  Resp Panel by RT-PCR (Flu A&B, Covid) Nasopharyngeal Swab     Status: None   Collection Time: 02/14/21  3:37 PM   Specimen: Nasopharyngeal Swab; Nasopharyngeal(NP) swabs in vial transport medium  Result Value Ref Range Status   SARS Coronavirus 2 by RT PCR NEGATIVE NEGATIVE Final    Comment: (NOTE) SARS-CoV-2 target nucleic acids are NOT DETECTED.  The SARS-CoV-2 RNA is generally detectable in upper respiratory specimens during the acute phase of infection. The lowest concentration of SARS-CoV-2 viral copies this assay can detect is 138 copies/mL. A negative result does not preclude SARS-Cov-2 infection and should not be used as the sole basis for treatment or other patient management decisions. A negative result may occur with  improper specimen collection/handling, submission of specimen other than nasopharyngeal swab, presence of viral mutation(s) within the areas targeted by this assay, and inadequate number of viral copies(<138 copies/mL). A negative result must be combined with clinical observations, patient history, and  epidemiological information. The expected result is Negative.  Fact Sheet for Patients:  EntrepreneurPulse.com.au  Fact Sheet for Healthcare Providers:  IncredibleEmployment.be  This test is no t yet approved or cleared by the [place] FDA and  has been authorized for detection and/or diagnosis of SARS-CoV-2 by FDA under an Emergency Use Authorization (EUA). This EUA will remain  in effect (meaning this test can be used) for the duration of  the COVID-19 declaration under Section 564(b)(1) of the Act, 21 U.S.C.section 360bbb-3(b)(1), unless the authorization is terminated  or revoked sooner.       Influenza A by PCR NEGATIVE NEGATIVE Final   Influenza B by PCR NEGATIVE NEGATIVE Final    Comment: (NOTE) The Xpert Xpress SARS-CoV-2/FLU/RSV plus assay is intended as an aid in the diagnosis of influenza from Nasopharyngeal swab specimens and should not be used as a sole basis for treatment. Nasal washings and aspirates are unacceptable for Xpert Xpress SARS-CoV-2/FLU/RSV testing.  Fact Sheet for Patients: EntrepreneurPulse.com.au  Fact Sheet for Healthcare Providers: IncredibleEmployment.be  This test is not yet approved or cleared by the [place] FDA and has been authorized for detection and/or diagnosis of SARS-CoV-2 by FDA under an Emergency Use Authorization (EUA). This EUA will remain in effect (meaning this test can be used) for the duration of the COVID-19 declaration under Section 564(b)(1) of the Act, 21 U.S.C. section 360bbb-3(b)(1), unless the authorization is terminated or revoked.  Performed at [facility], [address], [place], [place] [address]     Radiology Studies: DG Tibia/Fibula Left  Result Date: 02/14/2021 CLINICAL DATA:  pain, open wound lower leg, hx DM EXAM: LEFT TIBIA AND FIBULA - 2 VIEW COMPARISON:  None. FINDINGS: There is a soft tissue wound along the lateral  lower leg. Diffuse soft tissue swelling. There is mild periostitis along the lateral aspect of the distal fibula. No acute fracture. IMPRESSION: Diffuse lower leg soft tissue swelling with large lateral lower leg soft tissue wound. Adjacent mild periostitis of the lateral aspect of the distal fibula, concerning for possible developing bone infection. Electronically Signed   By: Maurine Simmering M.D.   On: 02/14/2021 14:36   MR TIBIA FIBULA LEFT WO CONTRAST  Result Date: 02/14/2021 CLINICAL DATA:  Open wounds left lower extremity. EXAM: MRI OF LOWER LEFT EXTREMITY WITHOUT CONTRAST TECHNIQUE: Multiplanar, multisequence MR imaging of the left lower extremity was performed. No intravenous contrast was administered. COMPARISON:  Radiographs, same date. FINDINGS: Examination limited by patient motion. There is diffuse and marked subcutaneous soft tissue swelling/edema/fluid suggesting cellulitis. There is a moderate-sized open wound noted along the lateral aspect of the lower leg but I do not see a discrete fluid collection to suggest a drainable abscess. Moderate fluid surrounding the calf muscle fascia. Edema like signal changes in the gastroc muscles bilaterally suggesting myositis. The soleus muscles are unremarkable. No findings suspicious for pyomyositis. The tibia and fibula are intact.  No findings for osteomyelitis. IMPRESSION: 1. Diffuse and marked subcutaneous soft tissue swelling/edema/fluid suggesting cellulitis. 2. Moderate-sized open wound along the lateral aspect of the lower leg but I do not see a discrete fluid collection to suggest a drainable abscess. 3. Edema like signal changes in the gastroc muscles bilaterally suggesting myositis. 4. No findings for pyomyositis or osteomyelitis. Electronically Signed   By: Marijo Sanes M.D.   On: 02/14/2021 18:54   MR FOOT LEFT WO CONTRAST  Result Date: 02/14/2021 CLINICAL DATA:  Foot pain and swelling. EXAM: MRI OF THE LEFT FOOT WITHOUT CONTRAST TECHNIQUE:  Multiplanar, multisequence MR imaging of the left foot was performed. No intravenous contrast was administered. COMPARISON:  Radiographs, same date. FINDINGS: Marked subcutaneous soft tissue swelling/edema/fluid consistent with cellulitis. Open wounds suspected involving the first, second and third toes dorsally. Abnormal T1 and T2 signal intensity involving the distal aspect of the second proximal phalanx, middle phalanx and the distal phalanx consistent with osteomyelitis. There is also abnormal signal intensity in the  distal phalanx of the third toe consistent with osteomyelitis. No discrete drainable soft tissue abscess is identified. There is mild diffuse myositis but no findings suspicious for pyomyositis. The metatarsal and tarsal bones are intact. IMPRESSION: 1. Marked subcutaneous soft tissue swelling/edema/fluid consistent with cellulitis. 2. Osteomyelitis involving the entire second toe and the distal phalanx of the third toe. 3. No discrete drainable soft tissue abscess. 4. Mild diffuse myositis but no findings suspicious for pyomyositis. Electronically Signed   By: Marijo Sanes M.D.   On: 02/14/2021 18:59   MR HIP LEFT WO CONTRAST  Result Date: 02/14/2021 CLINICAL DATA:  Left hip pain. EXAM: MR OF THE LEFT HIP WITHOUT CONTRAST TECHNIQUE: Multiplanar, multisequence MR imaging was performed. No intravenous contrast was administered. COMPARISON:  Radiographs, same date. FINDINGS: Both hips are normally located. Mild degenerative changes bilaterally. No stress fracture or AVN. No hip joint effusion. No periarticular fluid collections to suggest a paralabral cyst. Labral degenerative changes with suspected labral tears. The hip and pelvic musculature are unremarkable. No muscle tear, myositis or mass. No findings for peritendinitis or trochanteric bursitis. The hamstring tendons are intact. No significant intrapelvic abnormalities are identified. Mild prostate gland enlargement is noted. Small bilateral  inguinal lymph nodes but no mass or adenopathy. No inguinal hernia. IMPRESSION: 1. Mild bilateral hip joint degenerative changes but no stress fracture or AVN. 2. Labral degenerative changes with suspected labral tears. 3. Intact bony pelvis. 4. No significant intrapelvic abnormalities. Electronically Signed   By: Marijo Sanes M.D.   On: 02/14/2021 18:50   DG Foot Complete Left  Result Date: 02/14/2021 CLINICAL DATA:  pain, open wound lower leg, hx DM EXAM: LEFT FOOT - COMPLETE 3+ VIEW COMPARISON:  None. FINDINGS: Diffuse soft tissue swelling of the foot. There is no evidence of acute fracture. Vascular calcifications. Mild midfoot arthritis. No frank bony destruction. IMPRESSION: Diffuse soft tissue swelling without evidence of acute osseous abnormality in the left foot. Electronically Signed   By: Maurine Simmering M.D.   On: 02/14/2021 14:38   DG Hip Unilat W or Wo Pelvis 2-3 Views Left  Result Date: 02/14/2021 CLINICAL DATA:  Pain.  Open wound lower leg EXAM: DG HIP (WITH OR WITHOUT PELVIS) 2-3V LEFT COMPARISON:  None. FINDINGS: Hip joints and SI joints symmetric and unremarkable. Degenerative changes in the visualized lower lumbar spine. No acute bony abnormality. Specifically, no fracture, subluxation, or dislocation. Soft tissues are intact. IMPRESSION: No acute bony abnormality. Electronically Signed   By: Rolm Baptise M.D.   On: 02/14/2021 14:33      Alzora [address] [place]  If 7PM-7AM, please contact night-coverage [email or web address] 02/15/2021, 10:27 AM

## 2021-02-15 NOTE — Op Note (Signed)
DATE OF SERVICE: 02/15/2021  PATIENT:  Adam Perez  65 y.o. male  PRE-OPERATIVE DIAGNOSIS:  Atherosclerosis of native arteries of left lower extremity causing diabetic foot infection  POST-OPERATIVE DIAGNOSIS:  Same  PROCEDURE:   1) US guided right common femoral artery access 2) Aortogram 3) Left lower extremity angiogram with second order cannulation (82mL total contrast) 4) Conscious sedation (37 minutes)  SURGEON:  [name]. [name], MD  ASSISTANT: none  ANESTHESIA:   local and IV sedation  ESTIMATED BLOOD LOSS: minimal  LOCAL MEDICATIONS USED:  LIDOCAINE   COUNTS: confirmed correct.  PATIENT DISPOSITION:  PACU - hemodynamically stable.   Delay start of Pharmacological VTE agent (>24hrs) due to surgical blood loss or risk of bleeding: no  INDICATION FOR PROCEDURE: Adam Perez is a 65 y.o. male with left diabetic foot infection in setting of atherosclerosis. After careful discussion of risks, benefits, and alternatives the patient was offered angiography. The patient understood and wished to proceed.  OPERATIVE FINDINGS:  Terminal aorta and iliac arteries: Widely patent, tortuous, without flow limiting stenosis.  Left lower extremity: Common femoral artery: patent without flow limiting stenosis  Profunda femoris artery: patent without flow limiting stenosis  Superficial femoral artery: patent without flow limiting stenosis Popliteal artery: distal artery critically stenosed at junction with TP trunk Anterior tibial artery: occluded Tibioperoneal trunk: proximal artery critically stenosed at junction with popliteal artery Peroneal artery: only calf vessel patent, flows to ankle and arborizes to pedal circulation Posterior tibial artery: occluded Pedal circulation: fills via collateral flow from peroneal artery  WIfI score 2/1/2 - clinical stage 4. Risk of amputation high. Benefit of revascularization moderate.   DESCRIPTION OF PROCEDURE: After identification of  the patient in the pre-operative holding area, the patient was transferred to the operating room. The patient was positioned supine on the operating room table. Anesthesia was induced. The groins was prepped and draped in standard fashion. A surgical pause was performed confirming correct patient, procedure, and operative location.  The right groin was anesthetized with subcutaneous injection of 1% lidocaine. Using ultrasound guidance, the right common femoral artery was accessed with micropuncture technique. Fluoroscopy was used to confirm cannulation over the femoral head. The 11F sheath was upsized to 11F.   A Benson wire was advanced into the distal aorta. Over the wire an omni flush catheter was advanced to the level of L2. Aortogram was performed - see above for details.   The left common iliac artery was selected with a glidewire guidewire. The wire was advanced into the common femoral artery. Over the wire the omni flush catheter was advanced into the external iliac artery. Selective angiography was performed - see above for details.   A mynx device was used to close the arteriotomy. Hemostasis was excellent upon completion.  Conscious sedation was administered with the use of IV fentanyl and midazolam under continuous physician and nurse monitoring.  Heart rate, blood pressure, and oxygen saturation were continuously monitored.  Total sedation time was 37 minutes  Upon completion of the case instrument and sharps counts were confirmed correct. The patient was transferred to the PACU in good condition. I was present for all portions of the procedure.  PLAN: Could not tolerate angiogram with moderate sedation. Will need revascularization to maximize chances of wound healing. Plan pop/TP trunk endarterectomy, toe amputation, venous ulcer debridement in OR Monday.   [name]. [name], MD [facility] Phone Number: [phone] 02/15/2021 1:27 PM

## 2021-02-15 NOTE — Plan of Care (Signed)
Patient seen this AM - full note to follow. L DM foot infection with osteo; left lateral malleolus venous ulceration. Check ABI this AM. Plan LLE angiogram with possible intervention in cath lab later today. OK for clear liquids until 10AM. NPO thereafter.   [name]. [name], MD [facility] Phone Number: [phone] 02/15/2021 7:49 AM

## 2021-02-15 NOTE — Consult Note (Signed)
ORTHOPAEDIC CONSULTATION  REQUESTING PHYSICIAN: Mercy Riding, MD  Chief Complaint: Osteomyelitis ulceration and pain left foot.  HPI: Adam Perez is a 65 y.o. male who presents with osteomyelitis and ischemic ulceration of the left forefoot.  Patient states he is status post a stroke involving the left upper and left lower extremity several years ago.  Patient states he has been going to the [place] center and has been placed on a fluid pill for the venous insufficiency and swelling of the left lower extremity with chronic venous ulceration lateral aspect of the left calf.  Past Medical History:  Diagnosis Date   Diabetes mellitus without complication ([place])    Hemiparesis ([place])    left side; residual from prior acute ischemic CVA   HTN (hypertension)    Ischemic cerebrovascular accident (CVA) ([place])    with residual left hemiparesis   History reviewed. No pertinent surgical history. Social History   Socioeconomic History   Marital status: Divorced    Spouse name: Not on file   Number of children: Not on file   Years of education: Not on file   Highest education level: Not on file  Occupational History   Not on file  Tobacco Use   Smoking status: Never   Smokeless tobacco: Never  Substance and Sexual Activity   Alcohol use: No   Drug use: Never   Sexual activity: Not on file  Other Topics Concern   Not on file  Social History Narrative   Not on file   Social Determinants of Health   Financial Resource Strain: Not on file  Food Insecurity: Not on file  Transportation Needs: Not on file  Physical Activity: Not on file  Stress: Not on file  Social Connections: Not on file   Family History  Problem Relation Age of Onset   Diabetes Brother    Diabetes Brother    - negative except otherwise stated in the family history section No Known Allergies Prior to Admission medications   Medication Sig Start Date End Date Taking? Authorizing Provider  acetaminophen  (TYLENOL) 325 MG tablet Take 2 tablets (650 mg total) by mouth every 6 (six) hours as needed for mild pain (or Fever >/= 101). 07/06/19  Yes Emokpae, Courage, MD  acetaminophen (TYLENOL) 500 MG tablet TAKE ONE TABLET BY MOUTH EVERY 8 HOURS FOR DENTAL PAIN 09/13/20  Yes [provider]  amLODipine (NORVASC) 10 MG tablet Take 1 tablet (10 mg total) by mouth daily. 07/06/19  Yes Roxan Hockey, MD  aspirin EC 81 MG tablet Take 1 tablet (81 mg total) by mouth daily with breakfast. 07/06/19  Yes Emokpae, Courage, MD  atorvastatin (LIPITOR) 40 MG tablet Take 1 tablet (40 mg total) by mouth daily. 07/06/19  Yes Emokpae, Courage, MD  baclofen (LIORESAL) 10 MG tablet Take 30 mg by mouth daily. 12/14/20  Yes [provider]  clopidogrel (PLAVIX) 75 MG tablet Take 1 tablet (75 mg total) by mouth daily. 07/06/19  Yes Emokpae, Courage, MD  glimepiride (AMARYL) 2 MG tablet Take 1 tablet (2 mg total) by mouth 2 (two) times daily with a meal. 07/06/19 02/14/21 Yes Emokpae, Courage, MD  insulin glargine (LANTUS) 100 UNIT/ML Solostar Pen Inject 10 Units into the skin at bedtime. 07/06/19  Yes Emokpae, Courage, MD  lisinopril-hydrochlorothiazide (ZESTORETIC) 20-25 MG tablet Take 1 tablet by mouth daily. 09/13/20  Yes [provider]  lurasidone (LATUDA) 20 MG TABS tablet Take 1 tablet (20 mg total) by mouth daily. 07/06/19  Yes Roxan Hockey, MD  metFORMIN (GLUCOPHAGE) 500 MG tablet Take 500 mg by mouth 2 (two) times daily with a meal. 12/14/20  Yes [provider]  sertraline (ZOLOFT) 100 MG tablet Take 1.5 tablets (150 mg total) by mouth daily. 07/06/19  Yes Emokpae, Courage, MD  traZODone (DESYREL) 100 MG tablet TAKE TWO TABLETS BY MOUTH AT BEDTIME FOR SLEEP. TAKE AT 8PM. 11/30/20  Yes [provider]  aspirin 81 MG EC tablet Take 1 tablet by mouth daily. Patient not taking: Reported on 02/14/2021 03/19/16   [provider]  baclofen (LIORESAL) 20 MG tablet Take 1 tablet (20 mg  total) by mouth 3 (three) times daily. Patient not taking: Reported on 02/14/2021 07/06/19   Roxan Hockey, MD  blood glucose meter kit and supplies Relion Prime or Dispense other brand based on patient and insurance preference. Use up to four times daily as directed. (FOR ICD-9 250.00, 250.01). 07/06/19   Roxan Hockey, MD  blood glucose meter kit and supplies Dispense based on patient and insurance preference. Use up to four times daily as directed. (FOR ICD-9 250.00, 250.01). 07/06/19   Roxan Hockey, MD  clopidogrel (PLAVIX) 75 MG tablet Take 1 tablet by mouth daily. Patient not taking: Reported on 02/14/2021 03/19/16   [provider]  guaiFENesin (ROBITUSSIN) 100 MG/5ML liquid Take 10 mLs (200 mg total) by mouth 3 (three) times daily as needed for cough. Patient not taking: Reported on 02/14/2021 07/06/19   Roxan Hockey, MD  insulin glargine-yfgn (SEMGLEE) 100 UNIT/ML Pen INJECT 10 UNITS SUBCUTANEOUSLY AT BEDTIME FOR DIABETES (CONVERTED FROM LANTUS) Patient not taking: Reported on 02/14/2021 09/13/20   [provider]  Insulin Pen Needle (PEN NEEDLES 3/16") 31G X 5 MM MISC 1 Units by Does not apply route at bedtime. Use it to administer insulin 07/06/19   Roxan Hockey, MD  lisinopril (ZESTRIL) 20 MG tablet Take 1 tablet (20 mg total) by mouth daily. Patient not taking: Reported on 02/14/2021 07/06/19   Roxan Hockey, MD  metFORMIN (GLUCOPHAGE) 1000 MG tablet Take 1 tablet (1,000 mg total) by mouth 2 (two) times daily with a meal. 07/06/19 07/05/20  Roxan Hockey, MD  polyethylene glycol powder (GLYCOLAX/MIRALAX) 17 GM/SCOOP powder Take by mouth. Patient not taking: Reported on 02/14/2021 03/19/16   [provider]  prazosin (MINIPRESS) 1 MG capsule Take 1 capsule by mouth at bedtime. Patient not taking: Reported on 02/14/2021 11/30/20   [provider]  sertraline (ZOLOFT) 100 MG tablet TAKE ONE TABLET BY MOUTH DAILY FOR MENTAL HEALTH. NOTE INCREASE IN  DOSE Patient not taking: Reported on 02/14/2021 11/30/20   [provider]   DG Tibia/Fibula Left  Result Date: 02/14/2021 CLINICAL DATA:  pain, open wound lower leg, hx DM EXAM: LEFT TIBIA AND FIBULA - 2 VIEW COMPARISON:  None. FINDINGS: There is a soft tissue wound along the lateral lower leg. Diffuse soft tissue swelling. There is mild periostitis along the lateral aspect of the distal fibula. No acute fracture. IMPRESSION: Diffuse lower leg soft tissue swelling with large lateral lower leg soft tissue wound. Adjacent mild periostitis of the lateral aspect of the distal fibula, concerning for possible developing bone infection. Electronically Signed   By: Maurine Simmering M.D.   On: 02/14/2021 14:36   MR TIBIA FIBULA LEFT WO CONTRAST  Result Date: 02/14/2021 CLINICAL DATA:  Open wounds left lower extremity. EXAM: MRI OF LOWER LEFT EXTREMITY WITHOUT CONTRAST TECHNIQUE: Multiplanar, multisequence MR imaging of the left lower extremity was performed. No intravenous  contrast was administered. COMPARISON:  Radiographs, same date. FINDINGS: Examination limited by patient motion. There is diffuse and marked subcutaneous soft tissue swelling/edema/fluid suggesting cellulitis. There is a moderate-sized open wound noted along the lateral aspect of the lower leg but I do not see a discrete fluid collection to suggest a drainable abscess. Moderate fluid surrounding the calf muscle fascia. Edema like signal changes in the gastroc muscles bilaterally suggesting myositis. The soleus muscles are unremarkable. No findings suspicious for pyomyositis. The tibia and fibula are intact.  No findings for osteomyelitis. IMPRESSION: 1. Diffuse and marked subcutaneous soft tissue swelling/edema/fluid suggesting cellulitis. 2. Moderate-sized open wound along the lateral aspect of the lower leg but I do not see a discrete fluid collection to suggest a drainable abscess. 3. Edema like signal changes in the gastroc muscles  bilaterally suggesting myositis. 4. No findings for pyomyositis or osteomyelitis. Electronically Signed   By: Marijo Sanes M.D.   On: 02/14/2021 18:54   MR FOOT LEFT WO CONTRAST  Result Date: 02/14/2021 CLINICAL DATA:  Foot pain and swelling. EXAM: MRI OF THE LEFT FOOT WITHOUT CONTRAST TECHNIQUE: Multiplanar, multisequence MR imaging of the left foot was performed. No intravenous contrast was administered. COMPARISON:  Radiographs, same date. FINDINGS: Marked subcutaneous soft tissue swelling/edema/fluid consistent with cellulitis. Open wounds suspected involving the first, second and third toes dorsally. Abnormal T1 and T2 signal intensity involving the distal aspect of the second proximal phalanx, middle phalanx and the distal phalanx consistent with osteomyelitis. There is also abnormal signal intensity in the distal phalanx of the third toe consistent with osteomyelitis. No discrete drainable soft tissue abscess is identified. There is mild diffuse myositis but no findings suspicious for pyomyositis. The metatarsal and tarsal bones are intact. IMPRESSION: 1. Marked subcutaneous soft tissue swelling/edema/fluid consistent with cellulitis. 2. Osteomyelitis involving the entire second toe and the distal phalanx of the third toe. 3. No discrete drainable soft tissue abscess. 4. Mild diffuse myositis but no findings suspicious for pyomyositis. Electronically Signed   By: Marijo Sanes M.D.   On: 02/14/2021 18:59   MR HIP LEFT WO CONTRAST  Result Date: 02/14/2021 CLINICAL DATA:  Left hip pain. EXAM: MR OF THE LEFT HIP WITHOUT CONTRAST TECHNIQUE: Multiplanar, multisequence MR imaging was performed. No intravenous contrast was administered. COMPARISON:  Radiographs, same date. FINDINGS: Both hips are normally located. Mild degenerative changes bilaterally. No stress fracture or AVN. No hip joint effusion. No periarticular fluid collections to suggest a paralabral cyst. Labral degenerative changes with suspected  labral tears. The hip and pelvic musculature are unremarkable. No muscle tear, myositis or mass. No findings for peritendinitis or trochanteric bursitis. The hamstring tendons are intact. No significant intrapelvic abnormalities are identified. Mild prostate gland enlargement is noted. Small bilateral inguinal lymph nodes but no mass or adenopathy. No inguinal hernia. IMPRESSION: 1. Mild bilateral hip joint degenerative changes but no stress fracture or AVN. 2. Labral degenerative changes with suspected labral tears. 3. Intact bony pelvis. 4. No significant intrapelvic abnormalities. Electronically Signed   By: Marijo Sanes M.D.   On: 02/14/2021 18:50   DG Foot Complete Left  Result Date: 02/14/2021 CLINICAL DATA:  pain, open wound lower leg, hx DM EXAM: LEFT FOOT - COMPLETE 3+ VIEW COMPARISON:  None. FINDINGS: Diffuse soft tissue swelling of the foot. There is no evidence of acute fracture. Vascular calcifications. Mild midfoot arthritis. No frank bony destruction. IMPRESSION: Diffuse soft tissue swelling without evidence of acute osseous abnormality in the left foot. Electronically Signed  By: Maurine Simmering M.D.   On: 02/14/2021 14:38   DG Hip Unilat W or Wo Pelvis 2-3 Views Left  Result Date: 02/14/2021 CLINICAL DATA:  Pain.  Open wound lower leg EXAM: DG HIP (WITH OR WITHOUT PELVIS) 2-3V LEFT COMPARISON:  None. FINDINGS: Hip joints and SI joints symmetric and unremarkable. Degenerative changes in the visualized lower lumbar spine. No acute bony abnormality. Specifically, no fracture, subluxation, or dislocation. Soft tissues are intact. IMPRESSION: No acute bony abnormality. Electronically Signed   By: Rolm Baptise M.D.   On: 02/14/2021 14:33   - pertinent xrays, CT, MRI studies were reviewed and independently interpreted  Positive ROS: All other systems have been reviewed and were otherwise negative with the exception of those mentioned in the HPI and as above.  Physical Exam: General: Alert, no  acute distress Psychiatric: Patient is competent for consent with normal mood and affect Lymphatic: No axillary or cervical lymphadenopathy Cardiovascular: No pedal edema Respiratory: No cyanosis, no use of accessory musculature GI: No organomegaly, abdomen is soft and non-tender    Images:  @ENCIMAGES @  Labs:  Lab Results  Component Value Date   HGBA1C 6.1 (H) 02/14/2021   HGBA1C 12.4 (H) 07/05/2019   REPTSTATUS PENDING 02/14/2021   CULT PENDING 02/14/2021    Lab Results  Component Value Date   ALBUMIN 4.0 02/14/2021   ALBUMIN 3.7 07/06/2019   PREALBUMIN 27.4 02/14/2021     CBC EXTENDED Latest Ref Rng & Units 02/15/2021 02/14/2021 07/06/2019  WBC 4.0 - 10.5 K/uL 8.4 7.7 7.0  RBC 4.22 - 5.81 MIL/uL 3.37(L) 3.72(L) 3.83(L)  HGB 13.0 - 17.0 g/dL 9.6(L) 10.7(L) 11.3(L)  HCT 39.0 - 52.0 % 29.2(L) 33.3(L) 34.7(L)  PLT 150 - 400 K/uL 394 362 225  NEUTROABS 1.7 - 7.7 K/uL - 6.0 -  LYMPHSABS 0.7 - 4.0 K/uL - 1.2 -    Neurologic: Patient does not have protective sensation bilateral lower extremities.   MUSCULOSKELETAL:   Skin: Examination patient has brawny edema with significant venous swelling of the left lower extremity with a large venous insufficiency ulcer over the lateral aspect of the left calf.  Examination of the forefoot shows ischemic ulcers over the lateral border of the great toe and dorsally over the second and third toes.  I cannot palpate a dorsalis pedis pulse.  Review of the MRI scan shows osteomyelitis involving the second and third toe.  Plain radiograph shows no destructive bony changes.  Hemoglobin 9.8 white blood cell count 8.4.  Patient's most recent hemoglobin A1c is 6.1 and was 12.4 last year.  Assessment: Assessment: Stroke involving the left lower extremity with venous and arterial insufficiency with a chronic venous ulcer and osteomyelitis of the second and third toes with ischemic ulcers over the great toe second toe and third  toe.  Plan: Anticipate patient will need a left transmetatarsal amputation.  Dr. Carmelia Roller was evaluating the patient at the same time.  Anticipate arteriogram study for the patient today and decisions regarding vascular status versus transmetatarsal amputation determine after the study.  Discussed with the patient that he will need long-term compression therapy for the venous ulcer, possible biologic skin graft, and I will follow-up as an outpatient.  Thank you for the consult and the opportunity to see Mr. Army Melia, [place] [phone] 7:54 AM

## 2021-02-15 NOTE — Consult Note (Signed)
[facility] [place]  ASSESSMENT / PLAN: Adam Perez is a 65 y.o. male with left lower extremity diabetic foot infection, likely atherosclerosis of native arteries   Patient counseled patients with chronic limb threatening ischemia have an annual risk of cardiovascular mortality of 25% and a high risk of amputation.   Recommend the following which can slow the progression of atherosclerosis and reduce the risk of major adverse cardiac / limb events:  Complete cessation from all tobacco products. Blood glucose control with goal A1c < 7%. Blood pressure control with goal blood pressure < 140/90 mmHg. Lipid reduction therapy with goal LDL-C <100 mg/dL (<70 if symptomatic from PAD).  Aspirin 81mg  PO QD.  Atorvastatin 40-80mg  PO QD (or other "high intensity" statin therapy).  Plan left lower extremity angiogram with possible intervention via right common femoral approach in cath lab today at 12PM.   CHIEF COMPLAINT: left leg pain  HISTORY OF PRESENT ILLNESS: Adam Perez is a 65 y.o. male with several month history of left foot progressive ulceration. The patient is mostly bothered by pain in his left hip. He has suffered a stroke and has residual left sided weakness. He reports he is able to ambulate with a cane. He does not walk fast or far enough to claudicate. He does not report ischemic rest pain symptoms.   VASCULAR SURGICAL HISTORY: none  VASCULAR RISK FACTORS: Positive history of stroke / transient ischemic attack. Negative history of coronary artery disease.  Positive history of diabetes mellitus. Last A1c 6.1. Negative history of smoking.  Positive history of hypertension.  Negative history of chronic kidney disease.  Last GFR >60. Negative history of chronic obstructive pulmonary disease.  FUNCTIONAL STATUS: ECOG performance status: (0) Fully active, able to carry on all predisease performance without restriction Ambulatory status: Ambulatory  within the community with limits  Past Medical History:  Diagnosis Date   Diabetes mellitus without complication ([place])    Hemiparesis ([place])    left side; residual from prior acute ischemic CVA   HTN (hypertension)    Ischemic cerebrovascular accident (CVA) ([place])    with residual left hemiparesis   History reviewed. No pertinent surgical history.  Family History  Problem Relation Age of Onset   Diabetes Brother    Diabetes Brother     Social History   Socioeconomic History   Marital status: Divorced    Spouse name: Not on file   Number of children: Not on file   Years of education: Not on file   Highest education level: Not on file  Occupational History   Not on file  Tobacco Use   Smoking status: Never   Smokeless tobacco: Never  Substance and Sexual Activity   Alcohol use: No   Drug use: Never   Sexual activity: Not on file  Other Topics Concern   Not on file  Social History Narrative   Not on file   Social Determinants of Health   Financial Resource Strain: Not on file  Food Insecurity: Not on file  Transportation Needs: Not on file  Physical Activity: Not on file  Stress: Not on file  Social Connections: Not on file  Intimate Partner Violence: Not on file    No Known Allergies  Current Facility-Administered Medications  Medication Dose Route Frequency Provider Last Rate Last Admin   0.9 %  sodium chloride infusion   Intravenous Continuous Cherre Robins, MD       aspirin EC tablet 81 mg  81  mg Oral Q breakfast Elgergawy, Silver Huguenin, MD       atorvastatin (LIPITOR) tablet 40 mg  40 mg Oral Daily Elgergawy, Silver Huguenin, MD       cefTRIAXone (ROCEPHIN) 2 g in sodium chloride 0.9 % 100 mL IVPB  2 g Intravenous Q24H Elgergawy, Silver Huguenin, MD   Stopped at 02/14/21 1951   enoxaparin (LOVENOX) injection 55 mg  55 mg Subcutaneous QHS Elgergawy, Silver Huguenin, MD   55 mg at 02/14/21 2348   fentaNYL (SUBLIMAZE) injection 50 mcg  50 mcg Intravenous Once Triplett, Tammy, PA-C        HYDROcodone-acetaminophen (NORCO/VICODIN) 5-325 MG per tablet 1 tablet  1 tablet Oral Q6H PRN Elgergawy, Silver Huguenin, MD   1 tablet at 02/14/21 2348   insulin aspart (novoLOG) injection 0-15 Units  0-15 Units Subcutaneous TID WC Elgergawy, Silver Huguenin, MD       insulin aspart (novoLOG) injection 0-5 Units  0-5 Units Subcutaneous QHS Elgergawy, Silver Huguenin, MD       insulin glargine-yfgn (SEMGLEE) injection 10 Units  10 Units Subcutaneous QHS Elgergawy, Silver Huguenin, MD   10 Units at 02/15/21 0110   lurasidone (LATUDA) tablet 20 mg  20 mg Oral Daily Elgergawy, Silver Huguenin, MD       methocarbamol (ROBAXIN) 500 mg in dextrose 5 % 50 mL IVPB  500 mg Intravenous Q6H PRN Elgergawy, Silver Huguenin, MD   Stopped at 02/14/21 2140   metroNIDAZOLE (FLAGYL) IVPB 500 mg  500 mg Intravenous Q12H Elgergawy, Silver Huguenin, MD 100 mL/hr at 02/15/21 0553 500 mg at 02/15/21 0553   morphine 2 MG/ML injection 2 mg  2 mg Intravenous Q4H PRN Elgergawy, Silver Huguenin, MD   2 mg at 02/15/21 0542   sertraline (ZOLOFT) tablet 150 mg  150 mg Oral Daily Elgergawy, Silver Huguenin, MD       vancomycin (VANCOREADY) IVPB 1250 mg/250 mL  1,250 mg Intravenous Q24H Elgergawy, Silver Huguenin, MD        REVIEW OF SYSTEMS:  [X]  denotes positive finding, [ ]  denotes negative finding Cardiac  Comments:  Chest pain or chest pressure:    Shortness of breath upon exertion:    Short of breath when lying flat:    Irregular heart rhythm:        Vascular    Pain in calf, thigh, or hip brought on by ambulation:    Pain in feet at night that wakes you up from your sleep:     Blood clot in your veins:    Leg swelling:         Pulmonary    Oxygen at home:    Productive cough:     Wheezing:         Neurologic    Sudden weakness in arms or legs:     Sudden numbness in arms or legs:     Sudden onset of difficulty speaking or slurred speech:    Temporary loss of vision in one eye:     Problems with dizziness:         Gastrointestinal    Blood in stool:     Vomited  blood:         Genitourinary    Burning when urinating:     Blood in urine:        Psychiatric    Major depression:         Hematologic    Bleeding problems:    Problems with blood clotting too easily:  Skin    Rashes or ulcers:        Constitutional    Fever or chills:      PHYSICAL EXAM Vitals:   02/14/21 2150 02/14/21 2249 02/15/21 0443 02/15/21 0842  BP:  (!) 154/90 (!) 152/101 127/82  Pulse:  (!) 105 95 95  Resp:  20 18 15   Temp: 98 F (36.7 C) 97.9 F (36.6 C) 97.9 F (36.6 C)   TempSrc: Oral Oral Oral   SpO2:  100% 100% 100%  Weight: 108.6 kg 109.1 kg    Height: 5\' 7"  (1.702 m) 5\' 7"  (1.702 m)      Constitutional: Chronically ill appearing. no distress. Appears well nourished.  Neurologic: CN intact. no focal findings. no sensory loss. Psychiatric:  Mood and affect symmetric and appropriate. Eyes:  No icterus. No conjunctival pallor. Ears, nose, throat:  mucous membranes moist. Midline trachea.  Cardiac: regular rate and rhythm.  Respiratory:  unlabored. Abdominal:  soft, non-tender, non-distended.  Peripheral vascular: 2+ femoral pulses; no pedal pulses; chronic venous insufficiency L>R. Extremity: 2+ edema LLE ankle to thigh. no cyanosis. no pallor.  Skin: No gangrene. Severe forefoot and lateral malleolus venous ulceration (see below).      Lymphatic: + Stemmer's sign. no palpable lymphadenopathy.  PERTINENT LABORATORY AND RADIOLOGIC DATA  Most recent CBC CBC Latest Ref Rng & Units 02/15/2021 02/14/2021 07/06/2019  WBC 4.0 - 10.5 K/uL 8.4 7.7 7.0  Hemoglobin 13.0 - 17.0 g/dL 14/10/2020) 10.7(L) 11.3(L)  Hematocrit 39.0 - 52.0 % 29.2(L) 33.3(L) 34.7(L)  Platelets 150 - 400 K/uL 394 362 225     Most recent CMP CMP Latest Ref Rng & Units 02/15/2021 02/14/2021 07/06/2019  Glucose 70 - 99 mg/dL 14/10/2020) 14/09/2020) 07/08/2019)  BUN 8 - 23 mg/dL 17 676(H) 17  Creatinine 0.61 - 1.24 mg/dL 209(O 709(G) 28(Z  Sodium 135 - 145 mmol/L 141 138 146(H)  Potassium 3.5 -  5.1 mmol/L 3.6 5.2(H) 3.5  Chloride 98 - 111 mmol/L 107 106 114(H)  CO2 22 - 32 mmol/L 22 23 20(L)  Calcium 8.9 - 10.3 mg/dL 9.1 9.5 9.1  Total Protein 6.5 - 8.1 g/dL - 9.1(H) 6.7  Total Bilirubin 0.3 - 1.2 mg/dL - 1.2 0.8  Alkaline Phos 38 - 126 U/L - 82 80  AST 15 - 41 U/L - 36 17  ALT 0 - 44 U/L - 26 21    Renal function Estimated Creatinine Clearance: 71.9 mL/min (by C-G formula based on SCr of 1.19 mg/dL).  Hgb A1c MFr Bld (%)  Date Value  02/14/2021 6.1 (H)    MRI L foot Second and third toe osteomyelitis  9.47(M. 5.46, MD [facility] Phone Number: [phone] 02/15/2021 9:27 AM  Total time spent on preparing this encounter including chart review, data review, collecting history, examining the patient, coordinating care for this new patient, 60 minutes.  Portions of this report may have been transcribed using voice recognition software.  Every effort has been made to ensure accuracy; however, inadvertent computerized transcription errors may still be present.

## 2021-02-15 NOTE — Progress Notes (Signed)
OT Cancellation Note  Patient Details Name: Adam Perez MRN: [id number] DOB: May 23, 1955   Cancelled Treatment:    Reason Eval/Treat Not Completed: Patient at procedure or test/ unavailable. OT will follow up next available time as appropriate  Galen Manila 02/15/2021, 12:41 PM

## 2021-02-15 NOTE — Progress Notes (Signed)
PT Cancellation Note  Patient Details Name: Adam Perez MRN: [id number] DOB: 10/23/1955   Cancelled Treatment:    Reason Eval/Treat Not Completed: Patient at procedure or test/unavailable   Angelina Ok [facility] 02/15/2021, 12:21 PM Skip Mayer PT Acute Rehabilitation Services Pager [phone] Office [phone]

## 2021-02-15 NOTE — Progress Notes (Signed)
ABI has been completed.   Preliminary results in CV Proc.   Aundra Millet  02/15/2021 10:37 AM

## 2021-02-16 ENCOUNTER — Encounter (HOSPITAL_COMMUNITY): Payer: Self-pay | Admitting: Vascular Surgery

## 2021-02-16 DIAGNOSIS — I70242 Atherosclerosis of native arteries of left leg with ulceration of calf: Secondary | ICD-10-CM | POA: Diagnosis not present

## 2021-02-16 DIAGNOSIS — I743 Embolism and thrombosis of arteries of the lower extremities: Secondary | ICD-10-CM | POA: Diagnosis not present

## 2021-02-16 DIAGNOSIS — I70245 Atherosclerosis of native arteries of left leg with ulceration of other part of foot: Secondary | ICD-10-CM | POA: Diagnosis not present

## 2021-02-16 LAB — RENAL FUNCTION PANEL
Albumin: 3.1 g/dL — ABNORMAL LOW (ref 3.5–5.0)
Anion gap: 12 (ref 5–15)
BUN: 16 mg/dL (ref 8–23)
CO2: 21 mmol/L — ABNORMAL LOW (ref 22–32)
Calcium: 8.9 mg/dL (ref 8.9–10.3)
Chloride: 107 mmol/L (ref 98–111)
Creatinine, Ser: 1.09 mg/dL (ref 0.61–1.24)
GFR, Estimated: >60 mL/min (ref >60)
Glucose, Bld: 118 mg/dL — ABNORMAL HIGH (ref 70–99)
Phosphorus: 3.4 mg/dL (ref 2.5–4.6)
Potassium: 3.9 mmol/L (ref 3.5–5.1)
Sodium: 140 mmol/L (ref 135–145)

## 2021-02-16 LAB — GLUCOSE, CAPILLARY
Glucose-Capillary: 122 mg/dL — ABNORMAL HIGH (ref 70–99)
Glucose-Capillary: 128 mg/dL — ABNORMAL HIGH (ref 70–99)
Glucose-Capillary: 100 mg/dL — ABNORMAL HIGH (ref 70–99)
Glucose-Capillary: 169 mg/dL — ABNORMAL HIGH (ref 70–99)

## 2021-02-16 LAB — CBC
HCT: 27.5 % — ABNORMAL LOW (ref 39.0–52.0)
Hemoglobin: 9.2 g/dL — ABNORMAL LOW (ref 13.0–17.0)
MCH: 29 pg (ref 26.0–34.0)
MCHC: 33.5 g/dL (ref 30.0–36.0)
MCV: 86.8 fL (ref 80.0–100.0)
Platelets: 373 10*3/uL (ref 150–400)
RBC: 3.17 MIL/uL — ABNORMAL LOW (ref 4.22–5.81)
RDW: 16 % — ABNORMAL HIGH (ref 11.5–15.5)
WBC: 6.9 10*3/uL (ref 4.0–10.5)
nRBC: 0 % (ref 0.0–0.2)

## 2021-02-16 LAB — MAGNESIUM: Magnesium: 2.3 mg/dL (ref 1.7–2.4)

## 2021-02-16 LAB — CREATININE, SERUM
Creatinine, Ser: 1.1 mg/dL (ref 0.61–1.24)
GFR, Estimated: >60 mL/min (ref >60)

## 2021-02-16 MED ORDER — VANCOMYCIN HCL 1500 MG/300ML IV SOLN
1500.0000 mg | INTRAVENOUS | Status: AC
  Administered 2021-02-16 – 2021-02-20 (×5): 1500 mg via INTRAVENOUS
  Filled 2021-02-16 (×5): qty 300

## 2021-02-16 NOTE — Evaluation (Signed)
Physical Therapy Evaluation Patient Details Name: Adam Perez MRN: [id number] DOB: 1956-02-19 Today's Date: 02/16/2021  History of Present Illness  Pt is a 65 y.o. male admitted 02/14/21 with L hip pain and osteomyelitis and ischemic ulceration of L foot. XR negative for bony changes. S/p LLE angiogram 12/8. Planning for revascularization, toe amputation, and debridment on 12/11. PMH includes: CVA with reisudal left side weakness, HTN, DM type II.  Clinical Impression  Pt presents with L hip pain, generalized weakness, decreased functional mobility, and decreased activity tolerance secondary to above. PTA, pt lives alone and was modified independent with a cane. Pt currently requiring up to min A during ambulation. Pt is motivated to improve but concerned about how upcoming surgery will affect his mobility. Currently recommending HHPT to maximize functional mobility and independence prior to return home - pending post-op. Will continue to follow acutely to address established goals.    Recommendations for follow up therapy are one component of a multi-disciplinary discharge planning process, led by the attending physician.  Recommendations may be updated based on patient status, additional functional criteria and insurance authorization.  Follow Up Recommendations Home health PT    Assistance Recommended at Discharge Intermittent Supervision/Assistance  Functional Status Assessment Patient has had a recent decline in their functional status and demonstrates the ability to make significant improvements in function in a reasonable and predictable amount of time.  Equipment Recommendations  Rolling walker (2 wheels) ((TBD - pending post-op))    Recommendations for Other Services       Precautions / Restrictions Precautions Precautions: Fall Restrictions Weight Bearing Restrictions: No      Mobility  Bed Mobility               General bed mobility comments: received in recliner     Transfers Overall transfer level: Needs assistance Equipment used: Rolling walker (2 wheels) Transfers: Sit to/from Stand Sit to Stand: Min guard           General transfer comment: able to stand from recliner and commode with increased time and heavy reliance on RUE, cues for RW placement    Ambulation/Gait Ambulation/Gait assistance: Min guard;Min assist Gait Distance (Feet): 48 Feet Assistive device: Rolling walker (2 wheels) Gait Pattern/deviations: Step-to pattern;Decreased weight shift to left Gait velocity: decreased     General Gait Details: ambulated to bathroom and back to recliner; one LOB requring min A to correct  Stairs            Wheelchair Mobility    Modified Rankin (Stroke Patients Only)       Balance Overall balance assessment: Needs assistance Sitting-balance support: Feet supported;No upper extremity supported Sitting balance-Leahy Scale: Poor       Standing balance-Leahy Scale: Fair Standing balance comment: Pt able to static stand without RW but reliant on it for ambulation                             Pertinent Vitals/Pain Pain Assessment: Faces Faces Pain Scale: Hurts little more Pain Location: L hip Pain Descriptors / Indicators: Discomfort;Sore Pain Intervention(s): Limited activity within patient's tolerance;Monitored during session    Home Living Family/patient expects to be discharged to:: Private residence Living Arrangements: Alone Available Help at Discharge: Family Type of Home: House Home Access: Level entry       Home Layout: One level Home Equipment: Cane - Nurse, learning disability Additional Comments: Pt lives alone but has family nearby if he needs  help. [facility] walk in shower.    Prior Function Prior Level of Function : Independent/Modified Independent;Driving             Mobility Comments: Ambulated in home and community with quad cane       Hand  Dominance        Extremity/Trunk Assessment   Upper Extremity Assessment Upper Extremity Assessment: LUE deficits/detail LUE Deficits / Details: residual weakness from prior stroke    Lower Extremity Assessment Lower Extremity Assessment: RLE deficits/detail;LLE deficits/detail RLE Deficits / Details: Hip flexion, knee extension, knee flexion 4/5 LLE Deficits / Details: decreased AROM and strength grossly       Communication   Communication: Other (comment) (some difficulties with word finding)  Cognition Arousal/Alertness: Awake/alert Behavior During Therapy: Anxious;WFL for tasks assessed/performed Overall Cognitive Status: No family/caregiver present to determine baseline cognitive functioning Area of Impairment: Safety/judgement;Problem solving;Attention                   Current Attention Level: Sustained     Safety/Judgement: Decreased awareness of safety   Problem Solving: Slow processing General Comments: Pt alert and following all commands. Concerned about and focused on all the lines he is attached to. Difficulties with staying on task. Anxious to show me how well he could move but also not wanting to mobilize initially due to upcoming surgery. Some safety issues with RW use.        General Comments General comments (skin integrity, edema, etc.): Pt initially not wanting to move with PT until after surgery on Monday. Agreeable after education on PT and the importance of mobility. Provided education on how mobility will change after surgery - pt demonstrated understanding. Discussed follow-up therapy - pt wants to wait and see after surgery.    Exercises     Assessment/Plan    PT Assessment Patient needs continued PT services  PT Problem List Decreased strength;Decreased mobility;Decreased range of motion;Decreased balance;Decreased safety awareness;Decreased activity tolerance;Cardiopulmonary status limiting activity;Decreased knowledge of use of  DME;Pain       PT Treatment Interventions DME instruction;Gait training;Functional mobility training;Therapeutic activities;Patient/family education;Therapeutic exercise;Balance training    PT Goals (Current goals can be found in the Care Plan section)  Acute Rehab PT Goals Patient Stated Goal: to return home PT Goal Formulation: With patient Time For Goal Achievement: 03/02/21 Potential to Achieve Goals: Good    Frequency Min 3X/week   Barriers to discharge        Co-evaluation               AM-PAC PT "6 Clicks" Mobility  Outcome Measure Help needed turning from your back to your side while in a flat bed without using bedrails?: None Help needed moving from lying on your back to sitting on the side of a flat bed without using bedrails?: A Little Help needed moving to and from a bed to a chair (including a wheelchair)?: A Little Help needed standing up from a chair using your arms (e.g., wheelchair or bedside chair)?: A Little Help needed to walk in hospital room?: A Little Help needed climbing 3-5 steps with a railing? : A Lot 6 Click Score: 18    End of Session Equipment Utilized During Treatment: Gait belt Activity Tolerance: Patient tolerated treatment well Patient left: in chair;with chair alarm set;with call bell/phone within reach Nurse Communication: Mobility status PT Visit Diagnosis: Unsteadiness on feet (R26.81);Other abnormalities of gait and mobility (R26.89);Muscle weakness (generalized) (  M62.81);Difficulty in walking, not elsewhere classified (R26.2);Pain Pain - Right/Left: Left Pain - part of body: Hip    Time: 0750-0825 PT Time Calculation (min) (ACUTE ONLY): 35 min   Charges:   PT Evaluation $PT Eval Moderate Complexity: 1 Mod PT Treatments $Self Care/Home Management: 8-22        Daryel November, SPT   Daryel November 02/16/2021, 10:54 AM

## 2021-02-16 NOTE — Progress Notes (Signed)
PROGRESS NOTE  Adam Perez ZJI:[id number] DOB: [birth year]   PCP: [place]  Patient is from: Home.  Lives alone.  Uses cane at baseline.  Gets his care at the [place].  DOA: 02/14/2021 LOS: 2  Chief complaints:  Chief Complaint  Patient presents with   Knee Pain     Brief Narrative / Interim history: 65 year old M with PMH of CVA with left-sided spastic hemiparesis, IDDM-2, PVD, PTSD, GAD and neurocognitive deficits presenting with worsening left hip pain, malodorous wound in left foot and left lower extremity, and found to have infected left foot and leg diabetic wounds with cellulitis and left second and third toe osteomyelitis as noted on MRI.  Patient was started on IV vancomycin, ceftriaxone and Flagyl.  Vascular surgery and orthopedic surgery consulted.   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  Plan for revascularization and toe amputation on 02/19/2021.   Subjective: Seen and examined earlier this morning.  No major events overnight of this morning.  No complaints other than left hip pain.  Denies chest pain, dyspnea, GI or UTI symptoms.  Objective: Vitals:   02/16/21 0405 02/16/21 0757 02/16/21 0913 02/16/21 1130  BP: (!) 127/93 (!) 127/97 119/70 123/80  Pulse: 92 94  90  Resp: _0 Temp: 98.6 F (37 C) 97.6 F (36.4 C)  (!) 97.5 F (36.4 C)  TempSrc: Oral Oral  Oral  SpO2: 98% 96%  100%  Weight:      Height:        Intake/Output Summary (Last 24 hours) at 02/16/2021 1246 Last data filed at 02/16/2021 0912 Gross per 24 hour  Intake 1098.91 ml  Output 1600 ml  Net -501.09 ml   Filed Weights   02/14/21 2150 02/14/21 2249 02/15/21 1439  Weight: 108.6 kg 109.1 kg 109.3 kg    Examination:  GENERAL: No apparent distress.  Nontoxic. HEENT: MMM.  Vision and hearing grossly intact.  NECK: Supple.  No apparent JVD.  RESP:  No IWOB.  Fair  aeration bilaterally. CVS:  RRR. Heart sounds normal.  ABD/GI/GU: BS+. Abd soft, NTND.  MSK/EXT:  Moves extremities.  2+ BLE edema.  No palpable DP or PT pulses. SKIN: Lower extremity skin changes from chronic venous insufficiency.  Ulceration over dorsal aspect of left second and third toe and adjacent part of his foot.  Ulceration over lateral aspect of LLE. NEURO: Awake and alert. Oriented appropriately.  No apparent focal neuro deficit. PSYCH: Calm. Normal affect.   Procedures:  12/80/22-LE angiography that showed critical stenosis at popliteal artery to TP trunk junction, occluded anterior and posterior tibial artery.   Microbiology summarized: COVID-19 and influenza PCR nonreactive. Blood cultures NGTD.  Assessment & Plan: Diabetic infected left foot and left leg infection/cellulitis/myositis Left second and third toe osteomyelitis Left lower extremity PAD -MRI suggestive of cellulitis, myositis and osteomyelitis. -LLE angiography as above. -CRP 1.4.  ESR 76. -Continue Vanco, CTX and Flagyl -Plan for revascularization, ulcer debridement and toe amputation on 02/19/2021. -Holding Plavix for now-last dose on 12/6 -On Robaxin, Norco and IV morphine for pain control -Bowel regimen  Left hip pain/osteoarthritis: MRI suggest disorder arthritis. -Pain control -PT/OT     Controlled IDDM-2 with diabetic foot infection/PAD/hyperglycemia: A1c 6.1%. Recent Labs  Lab 02/15/21 1347 02/15/21 1556 02/15/21 2115 02/16/21 0600 02/16/21 1126  GLUCAP 142* 127* 111* 122* 128*  -Continue current insulin regimen -Continue home statin.  H/o Prior CVA with residual left-sided spastic hemiparesis -Continue aspirin and statin. -Hold home baclofen while on Robaxin -Hold Plavix   Essential hypertension: Normotensive. -Resume home amlodipine.   PTSD/anxiety/depression/neurocognitive deficit: somewhat anxious looking. -continue with home meds   Morbid obesity Body mass index is  37.74 kg/m. Nutrition Problem: Increased nutrient needs Etiology: wound healing (LLE wound) Signs/Symptoms: estimated needs Interventions: MVI, Prostat   DVT prophylaxis:  SCD's Start: 02/15/21 1523  On subcu Lovenox  Code Status: Full code Family Communication: Updated patient's friend at bedside with patient's permission. Level of care: Progressive Status is: Inpatient  Remains inpatient appropriate because: Further evaluation and treatment of infected diabetic wound, osteomyelitis and peripheral vascular disease.    Consultants:  Orthopedic surgery Vascular surgery   Sch Meds:  Scheduled Meds:  (feeding supplement) PROSource Plus  30 mL Oral BID WC   amLODipine  10 mg Oral Daily   aspirin EC  81 mg Oral Q breakfast   atorvastatin  40 mg Oral Daily   enoxaparin (LOVENOX) injection  55 mg Subcutaneous QHS   fentaNYL (SUBLIMAZE) injection  50 mcg Intravenous Once   insulin aspart  0-15 Units Subcutaneous TID WC   insulin aspart  0-5 Units Subcutaneous QHS   insulin glargine-yfgn  10 Units Subcutaneous QHS   lurasidone  20 mg Oral Daily   multivitamin with minerals  1 tablet Oral Daily   sertraline  150 mg Oral Daily   sodium chloride flush  3 mL Intravenous Q12H   traZODone  100 mg Oral QHS   Continuous Infusions:  sodium chloride Stopped (02/15/21 0954)   sodium chloride     cefTRIAXone (ROCEPHIN)  IV Stopped (02/15/21 1626)   methocarbamol (ROBAXIN) IV Stopped (02/14/21 2125)   metronidazole 500 mg (02/16/21 0605)   vancomycin     PRN Meds:.sodium chloride, acetaminophen, hydrALAZINE, HYDROcodone-acetaminophen, labetalol, methocarbamol (ROBAXIN) IV, morphine injection, ondansetron (ZOFRAN) IV, sodium chloride flush  Antimicrobials: Anti-infectives (From admission, onward)    Start     Dose/Rate Route Frequency Ordered Stop   02/16/21 1500  vancomycin (VANCOREADY) IVPB 1500 mg/300 mL        1,500 mg 150 mL/hr over 120 Minutes Intravenous Every 24 hours  02/16/21 0858     02/15/21 1500  vancomycin (VANCOREADY) IVPB 1250 mg/250 mL  Status:  Discontinued        1,250 mg 166.7 mL/hr over 90 Minutes Intravenous Every 24 hours 02/14/21 1700 02/16/21 0858   02/14/21 1800  metroNIDAZOLE (FLAGYL) IVPB 500 mg        500 mg 100 mL/hr over 60 Minutes Intravenous Every 12 hours 02/14/21 1649 02/21/21 1759   02/14/21 1700  cefTRIAXone (ROCEPHIN) 2 g in sodium chloride 0.9 % 100 mL IVPB        2 g 200 mL/hr over 30 Minutes Intravenous Every 24 hours 02/14/21 1649 02/21/21 1659   02/14/21 1345  vancomycin (VANCOREADY) IVPB 2000 mg/400 mL        2,000 mg 200 mL/hr over 120 Minutes Intravenous  Once 02/14/21 1331 02/14/21 1655   02/14/21 1330  vancomycin (VANCOCIN) IVPB 1000 mg/200 mL premix  Status:  Discontinued        1,000 mg 200 mL/hr over 60 Minutes Intravenous  Once 02/14/21 1327 02/14/21 1331        I have personally reviewed the following labs and images: CBC: Recent Labs  Lab 02/14/21 1317 02/15/21 0045 02/16/21 0435  WBC 7.7 8.4 6.9  NEUTROABS 6.0  --   --  HGB 10.7* 9.6* 9.2*  HCT 33.3* 29.2* 27.5*  MCV 89.5 86.6 86.8  PLT 362 394 373   BMP &GFR Recent Labs  Lab 02/14/21 1317 02/15/21 0045 02/16/21 0435  NA 138 141 140  K 5.2* 3.6 3.9  CL 106 107 107  CO2 23 22 21*  GLUCOSE 122* 145* 118*  BUN 26* 17 16  CREATININE 1.31* 1.19 1.09  1.10  CALCIUM 9.5 9.1 8.9  MG  --   --  2.3  PHOS  --   --  3.4   Estimated Creatinine Clearance: 77.9 mL/min (by C-G formula based on SCr of 1.1 mg/dL). Liver & Pancreas: Recent Labs  Lab 02/14/21 1317 02/16/21 0435  AST 36  --   ALT 26  --   ALKPHOS 82  --   BILITOT 1.2  --   PROT 9.1*  --   ALBUMIN 4.0 3.1*   No results for input(s): LIPASE, AMYLASE in the last 168 hours. No results for input(s): AMMONIA in the last 168 hours. Diabetic: Recent Labs    02/14/21 1301  HGBA1C 6.1*   Recent Labs  Lab 02/15/21 1347 02/15/21 1556 02/15/21 2115 02/16/21 0600  02/16/21 1126  GLUCAP 142* 127* 111* 122* 128*   Cardiac Enzymes: No results for input(s): CKTOTAL, CKMB, CKMBINDEX, TROPONINI in the last 168 hours. No results for input(s): PROBNP in the last 8760 hours. Coagulation Profile: Recent Labs  Lab 02/14/21 1344  INR 1.1   Thyroid Function Tests: No results for input(s): TSH, T4TOTAL, FREET4, T3FREE, THYROIDAB in the last 72 hours. Lipid Profile: No results for input(s): CHOL, HDL, LDLCALC, TRIG, CHOLHDL, LDLDIRECT in the last 72 hours. Anemia Panel: No results for input(s): VITAMINB12, FOLATE, FERRITIN, TIBC, IRON, RETICCTPCT in the last 72 hours. Urine analysis:    Component Value Date/Time   COLORURINE STRAW (A) 07/05/2019 1557   APPEARANCEUR CLEAR 07/05/2019 1557   LABSPEC 1.017 07/05/2019 1557   PHURINE 5.0 07/05/2019 1557   GLUCOSEU >=500 (A) 07/05/2019 1557   HGBUR NEGATIVE 07/05/2019 1557   BILIRUBINUR NEGATIVE 07/05/2019 1557   KETONESUR 20 (A) 07/05/2019 1557   PROTEINUR NEGATIVE 07/05/2019 1557   NITRITE NEGATIVE 07/05/2019 1557   LEUKOCYTESUR NEGATIVE 07/05/2019 1557   Sepsis Labs: Invalid input(s): PROCALCITONIN, [place]  Microbiology: Recent Results (from the past 240 hour(s))  Culture, blood (routine x 2)     Status: None (Preliminary result)   Collection Time: 02/14/21  1:24 PM   Specimen: BLOOD  Result Value Ref Range Status   Specimen Description BLOOD RIGHT ANTECUBITAL  Final   Special Requests   Final    BOTTLES DRAWN AEROBIC AND ANAEROBIC Blood Culture adequate volume   Culture   Final    NO GROWTH 2 DAYS Performed at [facility], [address] [place], [place] [address]    Report Status PENDING  Incomplete  Culture, blood (routine x 2)     Status: None (Preliminary result)   Collection Time: 02/14/21  1:44 PM   Specimen: BLOOD  Result Value Ref Range Status   Specimen Description BLOOD BLOOD LEFT HAND  Final   Special Requests   Final    BOTTLES DRAWN AEROBIC AND ANAEROBIC Blood Culture  adequate volume   Culture   Final    NO GROWTH 2 DAYS Performed at [facility], [address] [place], [place] [address]    Report Status PENDING  Incomplete  Resp Panel by RT-PCR (Flu A&B, Covid) Nasopharyngeal Swab     Status: None  Collection Time: 02/14/21  3:37 PM   Specimen: Nasopharyngeal Swab; Nasopharyngeal(NP) swabs in vial transport medium  Result Value Ref Range Status   SARS Coronavirus 2 by RT PCR NEGATIVE NEGATIVE Final    Comment: (NOTE) SARS-CoV-2 target nucleic acids are NOT DETECTED.  The SARS-CoV-2 RNA is generally detectable in upper respiratory specimens during the acute phase of infection. The lowest concentration of SARS-CoV-2 viral copies this assay can detect is 138 copies/mL. A negative result does not preclude SARS-Cov-2 infection and should not be used as the sole basis for treatment or other patient management decisions. A negative result may occur with  improper specimen collection/handling, submission of specimen other than nasopharyngeal swab, presence of viral mutation(s) within the areas targeted by this assay, and inadequate number of viral copies(<138 copies/mL). A negative result must be combined with clinical observations, patient history, and epidemiological information. The expected result is Negative.  Fact Sheet for Patients:  EntrepreneurPulse.com.au  Fact Sheet for Healthcare Providers:  IncredibleEmployment.be  This test is no t yet approved or cleared by the [place] FDA and  has been authorized for detection and/or diagnosis of SARS-CoV-2 by FDA under an Emergency Use Authorization (EUA). This EUA will remain  in effect (meaning this test can be used) for the duration of the COVID-19 declaration under Section 564(b)(1) of the Act, 21 U.S.C.section 360bbb-3(b)(1), unless the authorization is terminated  or revoked sooner.       Influenza A by PCR NEGATIVE NEGATIVE Final   Influenza B  by PCR NEGATIVE NEGATIVE Final    Comment: (NOTE) The Xpert Xpress SARS-CoV-2/FLU/RSV plus assay is intended as an aid in the diagnosis of influenza from Nasopharyngeal swab specimens and should not be used as a sole basis for treatment. Nasal washings and aspirates are unacceptable for Xpert Xpress SARS-CoV-2/FLU/RSV testing.  Fact Sheet for Patients: EntrepreneurPulse.com.au  Fact Sheet for Healthcare Providers: IncredibleEmployment.be  This test is not yet approved or cleared by the [place] FDA and has been authorized for detection and/or diagnosis of SARS-CoV-2 by FDA under an Emergency Use Authorization (EUA). This EUA will remain in effect (meaning this test can be used) for the duration of the COVID-19 declaration under Section 564(b)(1) of the Act, 21 U.S.C. section 360bbb-3(b)(1), unless the authorization is terminated or revoked.  Performed at [facility], [address], [place], [place] [address]     Radiology Studies: No results found.    Linsey Arteaga T. [place]  If 7PM-7AM, please contact night-coverage [email or web address] 02/16/2021, 12:46 PM

## 2021-02-16 NOTE — Progress Notes (Addendum)
  Progress Note    02/16/2021 7:43 AM 1 Day Post-Op  Subjective:  no pain R groin cath site   Vitals:   02/15/21 2317 02/16/21 0405  BP: (!) 113/99 (!) 127/93  Pulse: 98 92  Resp: 20 20  Temp: 97.6 F (36.4 C) 98.6 F (37 C)  SpO2: 98% 98%   Physical Exam: Lungs:  non labored Incisions:  R groin cath site soft without hematoma Extremities:  L leg and toe ulcers stable compared to picture on admission Neurologic: A&O  CBC    Component Value Date/Time   WBC 6.9 02/16/2021 0435   RBC 3.17 (L) 02/16/2021 0435   HGB 9.2 (L) 02/16/2021 0435   HCT 27.5 (L) 02/16/2021 0435   PLT 373 02/16/2021 0435   MCV 86.8 02/16/2021 0435   MCH 29.0 02/16/2021 0435   MCHC 33.5 02/16/2021 0435   RDW 16.0 (H) 02/16/2021 0435   LYMPHSABS 1.2 02/14/2021 1317   MONOABS 0.5 02/14/2021 1317   EOSABS 0.0 02/14/2021 1317   BASOSABS 0.1 02/14/2021 1317    BMET    Component Value Date/Time   NA 140 02/16/2021 0435   K 3.9 02/16/2021 0435   CL 107 02/16/2021 0435   CO2 21 (L) 02/16/2021 0435   GLUCOSE 118 (H) 02/16/2021 0435   BUN 16 02/16/2021 0435   CREATININE 1.10 02/16/2021 0435   CREATININE 1.09 02/16/2021 0435   CALCIUM 8.9 02/16/2021 0435   GFRNONAA >60 02/16/2021 0435   GFRNONAA >60 02/16/2021 0435   GFRAA >60 07/06/2019 0407   GFRAA >60 07/06/2019 0407    INR    Component Value Date/Time   INR 1.1 02/14/2021 1344     Intake/Output Summary (Last 24 hours) at 02/16/2021 0743 Last data filed at 02/16/2021 0602 Gross per 24 hour  Intake 858.91 ml  Output 1000 ml  Net -141.09 ml     Assessment/Plan:  65 y.o. male is s/p LLE arteriogram 1 Day Post-Op   Based on angiography and inability to tolerate procedure with conscious sedation, plan is for pop/tp trunk endarterectomy, toe amputations and ulcer debridement in the OR Monday.  Patient would like to stay inpatient until his surgery.  Paint toes with betadine.  Apply dry dressing to L lower leg and change daily.  Ok to  get OOB   Emilie Rutter, PA-C [facility] [phone] 02/16/2021 7:43 AM  VASCULAR STAFF ADDENDUM: I have independently interviewed and examined the patient. I agree with the above.  Unable to tolerate angiogram yesterday due to hip pain. Plan popliteal intervention (endo vs. Open), left toe 1-3 amputation, debridement of venous ulcer on Monday 12/12.  [name]. [name], MD [facility] Phone Number: [phone] 02/16/2021 10:20 AM

## 2021-02-16 NOTE — Progress Notes (Signed)
Pharmacy Antibiotic Note  Adam Perez is a 65 y.o. male admitted on 02/14/2021 with  DFI .  Pharmacy has been consulted for Vanco dosing.  ID: DFI. Afebrile. WBC WNL, Scr 1.  Vanco 12/7 >> CTX 12/7 >> Flagyl 12/7 >>    Microbiology results: 12/7 BCx: pending 12/7 Wound Cx: pending  Plan: Dose updated with current weight Vancomycin 1500mg  IV Q 24 hrs. Goal AUC 400-550. Expected AUC: 489 SCr used: 1.09  pop/tp trunk endarterectomy, toe amputations and ulcer debridement in the OR Monday.   Height: 5\' 7"  (170.2 cm) Weight: 109.3 kg (240 lb 15.4 oz) IBW/kg (Calculated) : 66.1  Temp (24hrs), Avg:98 F (36.7 C), Min:97.2 F (36.2 C), Max:98.6 F (37 C)  Recent Labs  Lab 02/14/21 1317 02/14/21 1510 02/15/21 0045 02/16/21 0435  WBC 7.7  --  8.4 6.9  CREATININE 1.31*  --  1.19 1.09  1.10  LATICACIDVEN 2.5* 2.4*  --   --     Estimated Creatinine Clearance: 77.9 mL/min (by C-G formula based on SCr of 1.1 mg/dL).    Not on File  Emilene Roma S. 14/08/22, PharmD, BCPS Clinical Staff Pharmacist [email or web address]   14/09/22 02/16/2021 8:59 AM

## 2021-02-16 NOTE — Evaluation (Signed)
Occupational Therapy Evaluation Patient Details Name: Adam Perez MRN: [id number] DOB: 09-03-1955 Today's Date: 02/16/2021   History of Present Illness Pt is a 65 y.o. male admitted 02/14/21 with L hip pain and osteomyelitis and ischemic ulceration of L foot. XR negative for bony changes. S/p LLE angiogram 12/8. Planning for revascularization, toe amputation, and debridment on 12/11. PMH includes: CVA with reisudal left side weakness, HTN, DM type II.   Clinical Impression   Pt received seated in bedside chair. He is very anxious and asking multiple questions about his pending amputation beginning of next week. Provided general education in need for assist at d/c from family as well as possible mobility restrictions post-operatively with plan for therapy to continue to follow to assist with adapting to post-operative precautions. Pt able to stand with min guard A and reports ambulating with PT earlier today. He does have baseline L sided weakness and spasticity from a previous stroke and has attended rehab and outpatient therapy in the past. Stable vitals throughout session as well as minimal complaints of pain or discomfort. Pt left seated in chair with needs addressed and call light in reach.      Recommendations for follow up therapy are one component of a multi-disciplinary discharge planning process, led by the attending physician.  Recommendations may be updated based on patient status, additional functional criteria and insurance authorization.   Follow Up Recommendations  Home health OT    Assistance Recommended at Discharge Intermittent Supervision/Assistance  Functional Status Assessment  Patient has had a recent decline in their functional status and demonstrates the ability to make significant improvements in function in a reasonable and predictable amount of time.  Equipment Recommendations  Other (comment) (will monitor post-operatively)    Recommendations for Other Services        Precautions / Restrictions Precautions Precautions: Fall      Mobility     Transfers Overall transfer level: Needs assistance   Transfers: Sit to/from Stand Sit to Stand: Min guard                      ADL either performed or assessed with clinical judgement   ADL Overall ADL's : Needs assistance/impaired Eating/Feeding: Independent   Grooming: Set up;Sitting   Upper Body Bathing: Set up   Lower Body Bathing: Moderate assistance   Upper Body Dressing : Set up   Lower Body Dressing: Min guard               Functional mobility during ADLs: Min guard       Vision   Vision Assessment?: No apparent visual deficits            Pertinent Vitals/Pain Pain Assessment: No/denies pain     Hand Dominance Right   Extremity/Trunk Assessment Upper Extremity Assessment Upper Extremity Assessment: LUE deficits/detail LUE Deficits / Details: residual weakness from prior stroke, spasticity with decreased elbow extension and shoulder movement           Communication Communication Communication: Other (comment) (word finding difficulties at baseline, stutter)   Cognition Arousal/Alertness: Awake/alert Behavior During Therapy: Anxious;WFL for tasks assessed/performed Overall Cognitive Status: No family/caregiver present to determine baseline cognitive functioning Area of Impairment: Safety/judgement;Problem solving;Attention                   Current Attention Level: Sustained     Safety/Judgement: Decreased awareness of safety   Problem Solving: Slow processing General Comments: Pt alert and following all commands. Concerned about  and focused on all the lines he is attached to. Difficulties with staying on task. Anxious to show me how well he could move but also not wanting to mobilize initially due to upcoming surgery. Some safety issues with RW use.                Home Living Family/patient expects to be discharged to:: Private  residence Living Arrangements: Alone Available Help at Discharge: Family Type of Home: House Home Access: Level entry     Home Layout: One level     Bathroom Shower/Tub: Engineer, production Accessibility: Yes   Home Equipment: Cane - quad;Electric scooter   Additional Comments: Pt lives alone but has family nearby if he needs help. [facility] walk in shower.      Prior Functioning/Environment Prior Level of Function : Independent/Modified Independent;Driving             Mobility Comments: Ambulated in home and community with quad cane          OT Problem List: Decreased activity tolerance;Impaired UE functional use;Pain;Increased edema      OT Treatment/Interventions: Self-care/ADL training;Therapeutic activities;DME and/or AE instruction;Patient/family education    OT Goals(Current goals can be found in the care plan section) Acute Rehab OT Goals Patient Stated Goal: return to prior level of function OT Goal Formulation: With patient Time For Goal Achievement: 03/02/21 Potential to Achieve Goals: Good ADL Goals Pt Will Perform Lower Body Bathing: with modified independence;with adaptive equipment;sit to/from stand Pt Will Perform Lower Body Dressing: with modified independence;with adaptive equipment;sit to/from stand Pt Will Transfer to Toilet: with supervision;ambulating;stand pivot transfer  OT Frequency: Min 2X/week    AM-PAC OT "6 Clicks" Daily Activity     Outcome Measure Help from another person eating meals?: None Help from another person taking care of personal grooming?: None Help from another person toileting, which includes using toliet, bedpan, or urinal?: A Little Help from another person bathing (including washing, rinsing, drying)?: A Little Help from another person to put on and taking off regular upper body clothing?: A Little Help from another person to put on and taking off regular lower body  clothing?: A Little 6 Click Score: 20   End of Session Nurse Communication: Mobility status  Activity Tolerance: Patient limited by fatigue;Other (comment) (anxious) Patient left: in chair;with chair alarm set  OT Visit Diagnosis: Other abnormalities of gait and mobility (R26.89)                Time: 1525-1600 OT Time Calculation (min): 35 min Charges:  OT General Charges $OT Visit: 1 Visit OT Evaluation $OT Eval Moderate Complexity: 1 Mod OT Treatments $Self Care/Home Management : 8-22 mins  Jasmine Pang [place], OTR/L 02/16/2021, 4:16 PM

## 2021-02-17 LAB — GLUCOSE, CAPILLARY
Glucose-Capillary: 122 mg/dL — ABNORMAL HIGH (ref 70–99)
Glucose-Capillary: 118 mg/dL — ABNORMAL HIGH (ref 70–99)
Glucose-Capillary: 133 mg/dL — ABNORMAL HIGH (ref 70–99)
Glucose-Capillary: 101 mg/dL — ABNORMAL HIGH (ref 70–99)
Glucose-Capillary: 110 mg/dL — ABNORMAL HIGH (ref 70–99)

## 2021-02-17 NOTE — Progress Notes (Signed)
PROGRESS NOTE  Adam Perez VOZ:[id number] DOB: Nov 25, 1955   PCP: [place]  Patient is from: Home.  Lives alone.  Uses cane at baseline.  Gets his care at the [place].  DOA: 02/14/2021 LOS: 3  Chief complaints:  Chief Complaint  Patient presents with   Knee Pain     Brief Narrative / Interim history: 65 year old M with PMH of CVA with left-sided spastic hemiparesis, IDDM-2, PVD, PTSD, GAD and neurocognitive deficits presenting with worsening left hip pain, malodorous wound in left foot and left lower extremity, and found to have infected left foot and leg diabetic wounds with cellulitis and left second and third toe osteomyelitis as noted on MRI.  Patient was started on IV vancomycin, ceftriaxone and Flagyl.  Vascular surgery and orthopedic surgery consulted.   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  Plan for revascularization and toe amputation on 02/19/2021.   Subjective: Seen and examined earlier this morning.  No major events overnight of this morning.  Hip pain improved.  Intermittent cramping in his left leg when he sleeps.  Otherwise, no complaints.  Objective: Vitals:   02/17/21 0000 02/17/21 0400 02/17/21 0742 02/17/21 1224  BP: 119/82 114/86 118/78 119/69  Pulse: 85 85 90 92  Resp: _0 Temp: 98.5 F (36.9 C) 98.4 F (36.9 C) 98.3 F (36.8 C) 97.9 F (36.6 C)  TempSrc: Oral Oral Oral Oral  SpO2: 96% 97% 99% 93%  Weight:      Height:        Intake/Output Summary (Last 24 hours) at 02/17/2021 1239 Last data filed at 02/17/2021 1229 Gross per 24 hour  Intake 980 ml  Output 1300 ml  Net -320 ml   Filed Weights   02/14/21 2150 02/14/21 2249 02/15/21 1439  Weight: 108.6 kg 109.1 kg 109.3 kg    Examination:   GENERAL: No apparent distress.  Nontoxic. HEENT: MMM.  Vision and hearing grossly intact.  NECK: Supple.  No apparent JVD.   RESP:  No IWOB.  Fair aeration bilaterally. CVS:  RRR. Heart sounds normal.  ABD/GI/GU: BS+. Abd soft, NTND.  MSK/EXT: 2+ BLE edema.  No palpable DP or PT pulses. SKIN: LUE skin change from chronic venous insufficiency.  Ulceration over dorsal aspect of left second and third toe and adjacent part of his left foot.  Ulceration over lateral aspect of LLE. NEURO: Awake and alert. Oriented appropriately.  No apparent focal neuro deficit. PSYCH: Calm. Normal affect.   Procedures:  12/80/22-LE angiography that showed critical stenosis at popliteal artery to TP trunk junction, occluded anterior and posterior tibial artery.   Microbiology summarized: COVID-19 and influenza PCR nonreactive. Blood cultures NGTD.  Assessment & Plan: Diabetic infected left foot and left leg infection/cellulitis/myositis Left second and third toe osteomyelitis Left lower extremity PAD -MRI suggestive of cellulitis, myositis and osteomyelitis. -LLE angiography as above. -CRP 1.4.  ESR 76. -Continue Vanco, CTX and Flagyl -Plan for revascularization, ulcer debridement and toe amputation on 02/19/2021. -Holding Plavix for now-last dose on 12/6 -On Robaxin, Norco and IV morphine for pain control -Bowel regimen  Left hip pain/osteoarthritis: MRI suggest disorder arthritis. -Pain control -PT/OT    Controlled IDDM-2 with diabetic foot infection/PAD/hyperglycemia: A1c 6.1%. Recent Labs  Lab 02/16/21 1707 02/16/21 2115 02/17/21 0601 02/17/21 0823 02/17/21 1221  GLUCAP 100* 169* 122* 118* 133*  -Continue current insulin regimen -Continue home statin.   H/o Prior  CVA with residual left-sided spastic hemiparesis -Continue aspirin and statin. -Hold home baclofen while on Robaxin -Hold Plavix   Essential hypertension: Normotensive. -Resume home amlodipine.   PTSD/anxiety/depression/neurocognitive deficit: somewhat anxious looking. -continue with home meds   Morbid obesity with complication including  diabetes, PAD... Body mass index is 37.74 kg/m. Nutrition Problem: Increased nutrient needs Etiology: wound healing (LLE wound) Signs/Symptoms: estimated needs Interventions: MVI, Prostat   DVT prophylaxis:  SCD's Start: 02/15/21 1523   On subcu Lovenox.  Code Status: Full code Family Communication: None at bedside today. Level of care: Med-Surg Status is: Inpatient  Remains inpatient appropriate because: Further evaluation and treatment of infected diabetic wound, osteomyelitis and peripheral vascular disease.    Consultants:  Orthopedic surgery Vascular surgery   Sch Meds:  Scheduled Meds:  (feeding supplement) PROSource Plus  30 mL Oral BID WC   amLODipine  10 mg Oral Daily   aspirin EC  81 mg Oral Q breakfast   atorvastatin  40 mg Oral Daily   enoxaparin (LOVENOX) injection  55 mg Subcutaneous QHS   insulin aspart  0-15 Units Subcutaneous TID WC   insulin aspart  0-5 Units Subcutaneous QHS   insulin glargine-yfgn  10 Units Subcutaneous QHS   lurasidone  20 mg Oral Daily   multivitamin with minerals  1 tablet Oral Daily   sertraline  150 mg Oral Daily   sodium chloride flush  3 mL Intravenous Q12H   traZODone  100 mg Oral QHS   Continuous Infusions:  sodium chloride     cefTRIAXone (ROCEPHIN)  IV Stopped (02/16/21 1914)   methocarbamol (ROBAXIN) IV Stopped (02/14/21 2125)   metronidazole 500 mg (02/17/21 0542)   vancomycin Stopped (02/16/21 1742)   PRN Meds:.sodium chloride, acetaminophen, hydrALAZINE, HYDROcodone-acetaminophen, labetalol, methocarbamol (ROBAXIN) IV, morphine injection, ondansetron (ZOFRAN) IV, sodium chloride flush  Antimicrobials: Anti-infectives (From admission, onward)    Start     Dose/Rate Route Frequency Ordered Stop   02/16/21 1500  vancomycin (VANCOREADY) IVPB 1500 mg/300 mL        1,500 mg 150 mL/hr over 120 Minutes Intravenous Every 24 hours 02/16/21 0858     02/15/21 1500  vancomycin (VANCOREADY) IVPB 1250 mg/250 mL  Status:   Discontinued        1,250 mg 166.7 mL/hr over 90 Minutes Intravenous Every 24 hours 02/14/21 1700 02/16/21 0858   02/14/21 1800  metroNIDAZOLE (FLAGYL) IVPB 500 mg        500 mg 100 mL/hr over 60 Minutes Intravenous Every 12 hours 02/14/21 1649 02/21/21 1759   02/14/21 1700  cefTRIAXone (ROCEPHIN) 2 g in sodium chloride 0.9 % 100 mL IVPB        2 g 200 mL/hr over 30 Minutes Intravenous Every 24 hours 02/14/21 1649 02/21/21 1659   02/14/21 1345  vancomycin (VANCOREADY) IVPB 2000 mg/400 mL        2,000 mg 200 mL/hr over 120 Minutes Intravenous  Once 02/14/21 1331 02/14/21 1655   02/14/21 1330  vancomycin (VANCOCIN) IVPB 1000 mg/200 mL premix  Status:  Discontinued        1,000 mg 200 mL/hr over 60 Minutes Intravenous  Once 02/14/21 1327 02/14/21 1331        I have personally reviewed the following labs and images: CBC: Recent Labs  Lab 02/14/21 1317 02/15/21 0045 02/16/21 0435  WBC 7.7 8.4 6.9  NEUTROABS 6.0  --   --   HGB 10.7* 9.6* 9.2*  HCT 33.3* 29.2* 27.5*  MCV 89.5 86.6 86.8  PLT 362 394 373   BMP &GFR Recent Labs  Lab 02/14/21 1317 02/15/21 0045 02/16/21 0435  NA 138 141 140  K 5.2* 3.6 3.9  CL 106 107 107  CO2 23 22 21*  GLUCOSE 122* 145* 118*  BUN 26* 17 16  CREATININE 1.31* 1.19 1.09  1.10  CALCIUM 9.5 9.1 8.9  MG  --   --  2.3  PHOS  --   --  3.4   Estimated Creatinine Clearance: 77.9 mL/min (by C-G formula based on SCr of 1.1 mg/dL). Liver & Pancreas: Recent Labs  Lab 02/14/21 1317 02/16/21 0435  AST 36  --   ALT 26  --   ALKPHOS 82  --   BILITOT 1.2  --   PROT 9.1*  --   ALBUMIN 4.0 3.1*   No results for input(s): LIPASE, AMYLASE in the last 168 hours. No results for input(s): AMMONIA in the last 168 hours. Diabetic: Recent Labs    02/14/21 1301  HGBA1C 6.1*   Recent Labs  Lab 02/16/21 1707 02/16/21 2115 02/17/21 0601 02/17/21 0823 02/17/21 1221  GLUCAP 100* 169* 122* 118* 133*   Cardiac Enzymes: No results for input(s):  CKTOTAL, CKMB, CKMBINDEX, TROPONINI in the last 168 hours. No results for input(s): PROBNP in the last 8760 hours. Coagulation Profile: Recent Labs  Lab 02/14/21 1344  INR 1.1   Thyroid Function Tests: No results for input(s): TSH, T4TOTAL, FREET4, T3FREE, THYROIDAB in the last 72 hours. Lipid Profile: No results for input(s): CHOL, HDL, LDLCALC, TRIG, CHOLHDL, LDLDIRECT in the last 72 hours. Anemia Panel: No results for input(s): VITAMINB12, FOLATE, FERRITIN, TIBC, IRON, RETICCTPCT in the last 72 hours. Urine analysis:    Component Value Date/Time   COLORURINE STRAW (A) 07/05/2019 1557   APPEARANCEUR CLEAR 07/05/2019 1557   LABSPEC 1.017 07/05/2019 1557   PHURINE 5.0 07/05/2019 1557   GLUCOSEU >=500 (A) 07/05/2019 1557   HGBUR NEGATIVE 07/05/2019 1557   BILIRUBINUR NEGATIVE 07/05/2019 1557   KETONESUR 20 (A) 07/05/2019 1557   PROTEINUR NEGATIVE 07/05/2019 1557   NITRITE NEGATIVE 07/05/2019 1557   LEUKOCYTESUR NEGATIVE 07/05/2019 1557   Sepsis Labs: Invalid input(s): PROCALCITONIN, [place]  Microbiology: Recent Results (from the past 240 hour(s))  Culture, blood (routine x 2)     Status: None (Preliminary result)   Collection Time: 02/14/21  1:24 PM   Specimen: BLOOD  Result Value Ref Range Status   Specimen Description BLOOD RIGHT ANTECUBITAL  Final   Special Requests   Final    BOTTLES DRAWN AEROBIC AND ANAEROBIC Blood Culture adequate volume   Culture   Final    NO GROWTH 3 DAYS Performed at [facility], [address] [place], [place] [address]    Report Status PENDING  Incomplete  Culture, blood (routine x 2)     Status: None (Preliminary result)   Collection Time: 02/14/21  1:44 PM   Specimen: BLOOD  Result Value Ref Range Status   Specimen Description BLOOD BLOOD LEFT HAND  Final   Special Requests   Final    BOTTLES DRAWN AEROBIC AND ANAEROBIC Blood Culture adequate volume   Culture   Final    NO GROWTH 3 DAYS Performed at [facility], [address] [place],  [address]    Report Status PENDING  Incomplete  Resp Panel by RT-PCR (Flu A&B, Covid) Nasopharyngeal Swab     Status: None   Collection Time: 02/14/21  3:37 PM   Specimen: Nasopharyngeal Swab; Nasopharyngeal(NP) swabs in  vial transport medium  Result Value Ref Range Status   SARS Coronavirus 2 by RT PCR NEGATIVE NEGATIVE Final    Comment: (NOTE) SARS-CoV-2 target nucleic acids are NOT DETECTED.  The SARS-CoV-2 RNA is generally detectable in upper respiratory specimens during the acute phase of infection. The lowest concentration of SARS-CoV-2 viral copies this assay can detect is 138 copies/mL. A negative result does not preclude SARS-Cov-2 infection and should not be used as the sole basis for treatment or other patient management decisions. A negative result may occur with  improper specimen collection/handling, submission of specimen other than nasopharyngeal swab, presence of viral mutation(s) within the areas targeted by this assay, and inadequate number of viral copies(<138 copies/mL). A negative result must be combined with clinical observations, patient history, and epidemiological information. The expected result is Negative.  Fact Sheet for Patients:  EntrepreneurPulse.com.au  Fact Sheet for Healthcare Providers:  IncredibleEmployment.be  This test is no t yet approved or cleared by the [place] FDA and  has been authorized for detection and/or diagnosis of SARS-CoV-2 by FDA under an Emergency Use Authorization (EUA). This EUA will remain  in effect (meaning this test can be used) for the duration of the COVID-19 declaration under Section 564(b)(1) of the Act, 21 U.S.C.section 360bbb-3(b)(1), unless the authorization is terminated  or revoked sooner.       Influenza A by PCR NEGATIVE NEGATIVE Final   Influenza B by PCR NEGATIVE NEGATIVE Final    Comment: (NOTE) The Xpert Xpress SARS-CoV-2/FLU/RSV plus  assay is intended as an aid in the diagnosis of influenza from Nasopharyngeal swab specimens and should not be used as a sole basis for treatment. Nasal washings and aspirates are unacceptable for Xpert Xpress SARS-CoV-2/FLU/RSV testing.  Fact Sheet for Patients: EntrepreneurPulse.com.au  Fact Sheet for Healthcare Providers: IncredibleEmployment.be  This test is not yet approved or cleared by the [place] FDA and has been authorized for detection and/or diagnosis of SARS-CoV-2 by FDA under an Emergency Use Authorization (EUA). This EUA will remain in effect (meaning this test can be used) for the duration of the COVID-19 declaration under Section 564(b)(1) of the Act, 21 U.S.C. section 360bbb-3(b)(1), unless the authorization is terminated or revoked.  Performed at [facility], [address] [place], [place] [address]     Radiology Studies: No results found.    Miquela Costabile T. [place]  If 7PM-7AM, please contact night-coverage [email or web address] 02/17/2021, 12:39 PM

## 2021-02-18 LAB — GLUCOSE, CAPILLARY
Glucose-Capillary: 110 mg/dL — ABNORMAL HIGH (ref 70–99)
Glucose-Capillary: 99 mg/dL (ref 70–99)
Glucose-Capillary: 103 mg/dL — ABNORMAL HIGH (ref 70–99)
Glucose-Capillary: 106 mg/dL — ABNORMAL HIGH (ref 70–99)

## 2021-02-18 NOTE — Anesthesia Preprocedure Evaluation (Addendum)
Anesthesia Evaluation  Patient identified by MRN, date of birth, ID band Patient awake    Reviewed: Allergy & Precautions, NPO status , Patient's Chart, lab work & pertinent test results  Airway Mallampati: III  TM Distance: >3 FB Neck ROM: Full    Dental no notable dental hx.    Pulmonary neg pulmonary ROS,    Pulmonary exam normal breath sounds clear to auscultation       Cardiovascular hypertension, Pt. on medications + Peripheral Vascular Disease  Normal cardiovascular exam Rhythm:Regular Rate:Normal     Neuro/Psych PSYCHIATRIC DISORDERS Anxiety Depression Left hemiparesis CVA, Residual Symptoms    GI/Hepatic negative GI ROS, Neg liver ROS,   Endo/Other  diabetes, Insulin Dependent  Renal/GU negative Renal ROS     Musculoskeletal negative musculoskeletal ROS (+)   Abdominal (+) + obese,   Peds  Hematology  (+) anemia , HLD   Anesthesia Other Findings Chronic lower limb ischemia  Reproductive/Obstetrics                            Anesthesia Physical Anesthesia Plan  ASA: 3  Anesthesia Plan: General   Post-op Pain Management:    Induction: Intravenous  PONV Risk Score and Plan: 2 and Ondansetron, Dexamethasone, Midazolam and Treatment may vary due to age or medical condition  Airway Management Planned: Oral ETT  Additional Equipment: Arterial line  Intra-op Plan:   Post-operative Plan: Extubation in OR  Informed Consent: I have reviewed the patients History and Physical, chart, labs and discussed the procedure including the risks, benefits and alternatives for the proposed anesthesia with the patient or authorized representative who has indicated his/her understanding and acceptance.     Dental advisory given  Plan Discussed with: CRNA  Anesthesia Plan Comments:        Anesthesia Quick Evaluation

## 2021-02-18 NOTE — Progress Notes (Signed)
PROGRESS NOTE  Adam Perez VXY:[id number] DOB: [birth year]   PCP: [facility]  Patient is from: Home.  Lives alone.  Uses cane at baseline.  Gets his care at the [place].  DOA: 02/14/2021 LOS: 4  Chief complaints:  Chief Complaint  Patient presents with   Knee Pain     Brief Narrative / Interim history: 65 year old M with PMH of CVA with left-sided spastic hemiparesis, IDDM-2, PVD, PTSD, GAD and neurocognitive deficits presenting with worsening left hip pain, malodorous wound in left foot and left lower extremity, and found to have infected left foot and leg diabetic wounds with cellulitis and left second and third toe osteomyelitis as noted on MRI.  Patient was started on IV vancomycin, ceftriaxone and Flagyl.  Vascular surgery and orthopedic surgery consulted.   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  Plan for revascularization and toe amputation on 02/19/2021.   Subjective: Seen and examined earlier this morning.  No major events overnight of this morning.  No complaints.  Pain improved.  Objective: Vitals:   02/17/21 2312 02/18/21 0445 02/18/21 0820 02/18/21 1145  BP: (!) 136/94 122/85 128/75 119/89  Pulse: 84 86 93 78  Resp: 18 18 17 19   Temp: 98.7 F (37.1 C) 98.5 F (36.9 C) 97.6 F (36.4 C) 97.9 F (36.6 C)  TempSrc: Oral Oral Oral Oral  SpO2: 97% 98% 97% 99%  Weight:      Height:        Intake/Output Summary (Last 24 hours) at 02/18/2021 1451 Last data filed at 02/18/2021 1145 Gross per 24 hour  Intake 819.69 ml  Output 2025 ml  Net -1205.31 ml   Filed Weights   02/14/21 2150 02/14/21 2249 02/15/21 1439  Weight: 108.6 kg 109.1 kg 109.3 kg    Examination:  GENERAL: No apparent distress.  Nontoxic. HEENT: MMM.  Vision and hearing grossly intact.  NECK: Supple.  No apparent JVD.  RESP:  No IWOB.  Fair aeration bilaterally. CVS:  RRR.  Heart sounds normal.  ABD/GI/GU: BS+. Abd soft, NTND.  MSK/EXT:  Moves extremities.  2+ BLE edema. SKIN: Dressing over left foot and LLE DCI. NEURO: Awake and alert. Oriented appropriately.  No apparent focal neuro deficit. PSYCH: Calm. Normal affect.   Procedures:  12/80/22-LE angiography that showed critical stenosis at popliteal artery to TP trunk junction, occluded anterior and posterior tibial artery.   Microbiology summarized: COVID-19 and influenza PCR nonreactive. Blood cultures NGTD.  Assessment & Plan: Diabetic infected left foot and left leg infection/cellulitis/myositis Left second and third toe osteomyelitis Left lower extremity PAD -MRI suggestive of cellulitis, myositis and osteomyelitis. -LLE angiography as above. -CRP 1.4.  ESR 76. -Continue Vanco, CTX and Flagyl -Plan for revascularization, ulcer debridement and toe amputation on 02/19/2021. -Continue holding Plavix for now-last dose on 12/6 -On Robaxin, Norco and IV morphine for pain control -Bowel regimen  Left hip pain/osteoarthritis: MRI suggest arthritis. -Pain control -PT/OT    Controlled IDDM-2 with diabetic foot infection/PAD/hyperglycemia: A1c 6.1%. Recent Labs  Lab 02/17/21 1221 02/17/21 1639 02/17/21 2135 02/18/21 0631 02/18/21 1143  GLUCAP 133* 101* 110* 110* 99  -Continue current insulin regimen -Continue home statin.   H/o Prior CVA with residual left-sided spastic hemiparesis -Continue aspirin and statin. -Hold home baclofen while on Robaxin -Hold Plavix   Essential hypertension: Normotensive. -Continue home meds.   PTSD/anxiety/depression/neurocognitive deficit: Stable. -continue with home meds   Morbid obesity with  complication including diabetes, PAD... Body mass index is 37.74 kg/m. Nutrition Problem: Increased nutrient needs Etiology: wound healing (LLE wound) Signs/Symptoms: estimated needs Interventions: MVI, Prostat   DVT prophylaxis:  SCD's Start: 02/15/21 1523    On subcu Lovenox.  Code Status: Full code Family Communication: None at bedside today. Level of care: Med-Surg Status is: Inpatient  Remains inpatient appropriate because: Further evaluation and treatment of infected diabetic wound, osteomyelitis and peripheral vascular disease.    Consultants:  Orthopedic surgery Vascular surgery   Sch Meds:  Scheduled Meds:  (feeding supplement) PROSource Plus  30 mL Oral BID WC   amLODipine  10 mg Oral Daily   aspirin EC  81 mg Oral Q breakfast   atorvastatin  40 mg Oral Daily   enoxaparin (LOVENOX) injection  55 mg Subcutaneous QHS   insulin aspart  0-15 Units Subcutaneous TID WC   insulin aspart  0-5 Units Subcutaneous QHS   insulin glargine-yfgn  10 Units Subcutaneous QHS   lurasidone  20 mg Oral Daily   multivitamin with minerals  1 tablet Oral Daily   sertraline  150 mg Oral Daily   sodium chloride flush  3 mL Intravenous Q12H   traZODone  100 mg Oral QHS   Continuous Infusions:  sodium chloride     cefTRIAXone (ROCEPHIN)  IV Stopped (02/17/21 1802)   methocarbamol (ROBAXIN) IV Stopped (02/14/21 2125)   metronidazole 500 mg (02/18/21 0503)   vancomycin Stopped (02/17/21 1707)   PRN Meds:.sodium chloride, acetaminophen, hydrALAZINE, HYDROcodone-acetaminophen, labetalol, methocarbamol (ROBAXIN) IV, morphine injection, ondansetron (ZOFRAN) IV, sodium chloride flush  Antimicrobials: Anti-infectives (From admission, onward)    Start     Dose/Rate Route Frequency Ordered Stop   02/16/21 1500  vancomycin (VANCOREADY) IVPB 1500 mg/300 mL        1,500 mg 150 mL/hr over 120 Minutes Intravenous Every 24 hours 02/16/21 0858     02/15/21 1500  vancomycin (VANCOREADY) IVPB 1250 mg/250 mL  Status:  Discontinued        1,250 mg 166.7 mL/hr over 90 Minutes Intravenous Every 24 hours 02/14/21 1700 02/16/21 0858   02/14/21 1800  metroNIDAZOLE (FLAGYL) IVPB 500 mg        500 mg 100 mL/hr over 60 Minutes Intravenous Every 12 hours 02/14/21  1649 02/21/21 1759   02/14/21 1700  cefTRIAXone (ROCEPHIN) 2 g in sodium chloride 0.9 % 100 mL IVPB        2 g 200 mL/hr over 30 Minutes Intravenous Every 24 hours 02/14/21 1649 02/21/21 1659   02/14/21 1345  vancomycin (VANCOREADY) IVPB 2000 mg/400 mL        2,000 mg 200 mL/hr over 120 Minutes Intravenous  Once 02/14/21 1331 02/14/21 1655   02/14/21 1330  vancomycin (VANCOCIN) IVPB 1000 mg/200 mL premix  Status:  Discontinued        1,000 mg 200 mL/hr over 60 Minutes Intravenous  Once 02/14/21 1327 02/14/21 1331        I have personally reviewed the following labs and images: CBC: Recent Labs  Lab 02/14/21 1317 02/15/21 0045 02/16/21 0435  WBC 7.7 8.4 6.9  NEUTROABS 6.0  --   --   HGB 10.7* 9.6* 9.2*  HCT 33.3* 29.2* 27.5*  MCV 89.5 86.6 86.8  PLT 362 394 373   BMP &GFR Recent Labs  Lab 02/14/21 1317 02/15/21 0045 02/16/21 0435  NA 138 141 140  K 5.2* 3.6 3.9  CL 106 107 107  CO2 23 22 21*  GLUCOSE 122* 145*  118*  BUN 26* 17 16  CREATININE 1.31* 1.19 1.09  1.10  CALCIUM 9.5 9.1 8.9  MG  --   --  2.3  PHOS  --   --  3.4   Estimated Creatinine Clearance: 77.9 mL/min (by C-G formula based on SCr of 1.1 mg/dL). Liver & Pancreas: Recent Labs  Lab 02/14/21 1317 02/16/21 0435  AST 36  --   ALT 26  --   ALKPHOS 82  --   BILITOT 1.2  --   PROT 9.1*  --   ALBUMIN 4.0 3.1*   No results for input(s): LIPASE, AMYLASE in the last 168 hours. No results for input(s): AMMONIA in the last 168 hours. Diabetic: No results for input(s): HGBA1C in the last 72 hours.  Recent Labs  Lab 02/17/21 1221 02/17/21 1639 02/17/21 2135 02/18/21 0631 02/18/21 1143  GLUCAP 133* 101* 110* 110* 99   Cardiac Enzymes: No results for input(s): CKTOTAL, CKMB, CKMBINDEX, TROPONINI in the last 168 hours. No results for input(s): PROBNP in the last 8760 hours. Coagulation Profile: Recent Labs  Lab 02/14/21 1344  INR 1.1   Thyroid Function Tests: No results for input(s): TSH,  T4TOTAL, FREET4, T3FREE, THYROIDAB in the last 72 hours. Lipid Profile: No results for input(s): CHOL, HDL, LDLCALC, TRIG, CHOLHDL, LDLDIRECT in the last 72 hours. Anemia Panel: No results for input(s): VITAMINB12, FOLATE, FERRITIN, TIBC, IRON, RETICCTPCT in the last 72 hours. Urine analysis:    Component Value Date/Time   COLORURINE STRAW (A) 07/05/2019 1557   APPEARANCEUR CLEAR 07/05/2019 1557   LABSPEC 1.017 07/05/2019 1557   PHURINE 5.0 07/05/2019 1557   GLUCOSEU >=500 (A) 07/05/2019 1557   HGBUR NEGATIVE 07/05/2019 1557   BILIRUBINUR NEGATIVE 07/05/2019 1557   KETONESUR 20 (A) 07/05/2019 1557   PROTEINUR NEGATIVE 07/05/2019 1557   NITRITE NEGATIVE 07/05/2019 1557   LEUKOCYTESUR NEGATIVE 07/05/2019 1557   Sepsis Labs: Invalid input(s): PROCALCITONIN, [place]  Microbiology: Recent Results (from the past 240 hour(s))  Culture, blood (routine x 2)     Status: None (Preliminary result)   Collection Time: 02/14/21  1:24 PM   Specimen: BLOOD  Result Value Ref Range Status   Specimen Description BLOOD RIGHT ANTECUBITAL  Final   Special Requests   Final    BOTTLES DRAWN AEROBIC AND ANAEROBIC Blood Culture adequate volume   Culture   Final    NO GROWTH 4 DAYS Performed at [facility], [address], [place], [place] [address]    Report Status PENDING  Incomplete  Culture, blood (routine x 2)     Status: None (Preliminary result)   Collection Time: 02/14/21  1:44 PM   Specimen: BLOOD  Result Value Ref Range Status   Specimen Description BLOOD BLOOD LEFT HAND  Final   Special Requests   Final    BOTTLES DRAWN AEROBIC AND ANAEROBIC Blood Culture adequate volume   Culture   Final    NO GROWTH 4 DAYS Performed at [facility], [address], [place], [place] [address]    Report Status PENDING  Incomplete  Resp Panel by RT-PCR (Flu A&B, Covid) Nasopharyngeal Swab     Status: None   Collection Time: 02/14/21  3:37 PM   Specimen: Nasopharyngeal Swab; Nasopharyngeal(NP)  swabs in vial transport medium  Result Value Ref Range Status   SARS Coronavirus 2 by RT PCR NEGATIVE NEGATIVE Final    Comment: (NOTE) SARS-CoV-2 target nucleic acids are NOT DETECTED.  The SARS-CoV-2 RNA is generally detectable in upper respiratory specimens  during the acute phase of infection. The lowest concentration of SARS-CoV-2 viral copies this assay can detect is 138 copies/mL. A negative result does not preclude SARS-Cov-2 infection and should not be used as the sole basis for treatment or other patient management decisions. A negative result may occur with  improper specimen collection/handling, submission of specimen other than nasopharyngeal swab, presence of viral mutation(s) within the areas targeted by this assay, and inadequate number of viral copies(<138 copies/mL). A negative result must be combined with clinical observations, patient history, and epidemiological information. The expected result is Negative.  Fact Sheet for Patients:  EntrepreneurPulse.com.au  Fact Sheet for Healthcare Providers:  IncredibleEmployment.be  This test is no t yet approved or cleared by the [place] FDA and  has been authorized for detection and/or diagnosis of SARS-CoV-2 by FDA under an Emergency Use Authorization (EUA). This EUA will remain  in effect (meaning this test can be used) for the duration of the COVID-19 declaration under Section 564(b)(1) of the Act, 21 U.S.C.section 360bbb-3(b)(1), unless the authorization is terminated  or revoked sooner.       Influenza A by PCR NEGATIVE NEGATIVE Final   Influenza B by PCR NEGATIVE NEGATIVE Final    Comment: (NOTE) The Xpert Xpress SARS-CoV-2/FLU/RSV plus assay is intended as an aid in the diagnosis of influenza from Nasopharyngeal swab specimens and should not be used as a sole basis for treatment. Nasal washings and aspirates are unacceptable for Xpert Xpress  SARS-CoV-2/FLU/RSV testing.  Fact Sheet for Patients: EntrepreneurPulse.com.au  Fact Sheet for Healthcare Providers: IncredibleEmployment.be  This test is not yet approved or cleared by the [place] FDA and has been authorized for detection and/or diagnosis of SARS-CoV-2 by FDA under an Emergency Use Authorization (EUA). This EUA will remain in effect (meaning this test can be used) for the duration of the COVID-19 declaration under Section 564(b)(1) of the Act, 21 U.S.C. section 360bbb-3(b)(1), unless the authorization is terminated or revoked.  Performed at [facility], [address], [place], [place] [address]     Radiology Studies: No results found.    Aeron Donaghey T. C[place]  If 7PM-7AM, please contact night-coverage [email or web address] 02/18/2021, 2:51 PM

## 2021-02-18 NOTE — Progress Notes (Signed)
  Progress Note    02/18/2021 8:02 AM 3 Days Post-Op  Subjective:  No new complaints this morning   Vitals:   02/17/21 2312 02/18/21 0445  BP: (!) 136/94 122/85  Pulse: 84 86  Resp: 18 18  Temp: 98.7 F (37.1 C) 98.5 F (36.9 C)  SpO2: 97% 98%   Physical Exam: Lungs:  non labored Incisions:  R groin cath site without hematoma Extremities:  dressing left in place L leg and foot Neurologic: A&O  CBC    Component Value Date/Time   WBC 6.9 02/16/2021 0435   RBC 3.17 (L) 02/16/2021 0435   HGB 9.2 (L) 02/16/2021 0435   HCT 27.5 (L) 02/16/2021 0435   PLT 373 02/16/2021 0435   MCV 86.8 02/16/2021 0435   MCH 29.0 02/16/2021 0435   MCHC 33.5 02/16/2021 0435   RDW 16.0 (H) 02/16/2021 0435   LYMPHSABS 1.2 02/14/2021 1317   MONOABS 0.5 02/14/2021 1317   EOSABS 0.0 02/14/2021 1317   BASOSABS 0.1 02/14/2021 1317    BMET    Component Value Date/Time   NA 140 02/16/2021 0435   K 3.9 02/16/2021 0435   CL 107 02/16/2021 0435   CO2 21 (L) 02/16/2021 0435   GLUCOSE 118 (H) 02/16/2021 0435   BUN 16 02/16/2021 0435   CREATININE 1.10 02/16/2021 0435   CREATININE 1.09 02/16/2021 0435   CALCIUM 8.9 02/16/2021 0435   GFRNONAA >60 02/16/2021 0435   GFRNONAA >60 02/16/2021 0435   GFRAA >60 07/06/2019 0407   GFRAA >60 07/06/2019 0407    INR    Component Value Date/Time   INR 1.1 02/14/2021 1344     Intake/Output Summary (Last 24 hours) at 02/18/2021 0802 Last data filed at 02/18/2021 0503 Gross per 24 hour  Intake 1179.69 ml  Output 1650 ml  Net -470.31 ml     Assessment/Plan:  65 y.o. male with CLI LLE 3 Days Post-Op   Unable to tolerate angiography with conscious sedation Plan is for OR tomorrow for open vs endovascular popliteal revascularization with debridement of left leg ulcer and TMA vs toe 1-3 amputation This procedure was discussed with the patient including risks and he is willing to proceed Consent ordered NPO    Emilie Rutter, PA-C [facility] [phone] 02/18/2021 8:02 AM

## 2021-02-19 ENCOUNTER — Encounter (HOSPITAL_COMMUNITY): Payer: Self-pay | Admitting: Vascular Surgery

## 2021-02-19 ENCOUNTER — Inpatient Hospital Stay (HOSPITAL_COMMUNITY): Payer: Medicare Other

## 2021-02-19 ENCOUNTER — Inpatient Hospital Stay (HOSPITAL_COMMUNITY): Payer: Medicare Other | Admitting: Anesthesiology

## 2021-02-19 ENCOUNTER — Encounter (HOSPITAL_COMMUNITY)
Admission: EM | Disposition: A | Discharge status: Rehabilitation Facility | Payer: Self-pay | Source: Home / Self Care | Attending: Student

## 2021-02-19 DIAGNOSIS — E11621 Type 2 diabetes mellitus with foot ulcer: Secondary | ICD-10-CM | POA: Diagnosis not present

## 2021-02-19 DIAGNOSIS — I70242 Atherosclerosis of native arteries of left leg with ulceration of calf: Secondary | ICD-10-CM | POA: Diagnosis not present

## 2021-02-19 DIAGNOSIS — I70245 Atherosclerosis of native arteries of left leg with ulceration of other part of foot: Secondary | ICD-10-CM | POA: Diagnosis not present

## 2021-02-19 DIAGNOSIS — L97529 Non-pressure chronic ulcer of other part of left foot with unspecified severity: Secondary | ICD-10-CM | POA: Diagnosis not present

## 2021-02-19 HISTORY — PX: ULTRASOUND GUIDANCE FOR VASCULAR ACCESS: SHX6516

## 2021-02-19 HISTORY — PX: LOWER EXTREMITY ANGIOGRAM: SHX5508

## 2021-02-19 HISTORY — PX: ANGIOPLASTY: SHX39

## 2021-02-19 HISTORY — PX: WOUND DEBRIDEMENT: SHX247

## 2021-02-19 HISTORY — PX: TRANSMETATARSAL AMPUTATION: SHX6197

## 2021-02-19 LAB — CBC
HCT: 27 % — ABNORMAL LOW (ref 39.0–52.0)
Hemoglobin: 8.9 g/dL — ABNORMAL LOW (ref 13.0–17.0)
MCH: 28.5 pg (ref 26.0–34.0)
MCHC: 33 g/dL (ref 30.0–36.0)
MCV: 86.5 fL (ref 80.0–100.0)
Platelets: 366 10*3/uL (ref 150–400)
RBC: 3.12 MIL/uL — ABNORMAL LOW (ref 4.22–5.81)
RDW: 15.9 % — ABNORMAL HIGH (ref 11.5–15.5)
WBC: 6.3 10*3/uL (ref 4.0–10.5)
nRBC: 0 % (ref 0.0–0.2)

## 2021-02-19 LAB — GLUCOSE, CAPILLARY
Glucose-Capillary: 126 mg/dL — ABNORMAL HIGH (ref 70–99)
Glucose-Capillary: 143 mg/dL — ABNORMAL HIGH (ref 70–99)

## 2021-02-19 LAB — RENAL FUNCTION PANEL
Albumin: 3 g/dL — ABNORMAL LOW (ref 3.5–5.0)
Anion gap: 9 (ref 5–15)
BUN: 22 mg/dL (ref 8–23)
CO2: 22 mmol/L (ref 22–32)
Calcium: 8.9 mg/dL (ref 8.9–10.3)
Chloride: 108 mmol/L (ref 98–111)
Creatinine, Ser: 1.07 mg/dL (ref 0.61–1.24)
GFR, Estimated: >60 mL/min (ref >60)
Glucose, Bld: 107 mg/dL — ABNORMAL HIGH (ref 70–99)
Phosphorus: 3.8 mg/dL (ref 2.5–4.6)
Potassium: 3.9 mmol/L (ref 3.5–5.1)
Sodium: 139 mmol/L (ref 135–145)

## 2021-02-19 LAB — CULTURE, BLOOD (ROUTINE X 2)
Culture: NO GROWTH
Culture: NO GROWTH
Special Requests: ADEQUATE
Special Requests: ADEQUATE

## 2021-02-19 LAB — POCT I-STAT, CHEM 8
BUN: 21 mg/dL (ref 8–23)
Calcium, Ion: 1.26 mmol/L (ref 1.15–1.40)
Chloride: 108 mmol/L (ref 98–111)
Creatinine, Ser: 1 mg/dL (ref 0.61–1.24)
Glucose, Bld: 117 mg/dL — ABNORMAL HIGH (ref 70–99)
HCT: 27 % — ABNORMAL LOW (ref 39.0–52.0)
Hemoglobin: 9.2 g/dL — ABNORMAL LOW (ref 13.0–17.0)
Potassium: 3.8 mmol/L (ref 3.5–5.1)
Sodium: 142 mmol/L (ref 135–145)
TCO2: 22 mmol/L (ref 22–32)

## 2021-02-19 LAB — MAGNESIUM: Magnesium: 2.3 mg/dL (ref 1.7–2.4)

## 2021-02-19 LAB — ABO/RH: ABO/RH(D): O POS

## 2021-02-19 IMAGING — RF DG ANG/EXT/UNI/OR LEFT
1 series · 15 of 24 positions shown · IV contrast (agent unspecified)
Comparison: None.

CLINICAL DATA: Popliteal endarterectomy

EXAM:
AKELAITIENE/EXT/UNI/ OR
CONTRAST:  Not provided
FLUOROSCOPY TIME:  Fluoroscopy Time:  6 minutes 38 seconds
Radiation Exposure Index (if provided by the fluoroscopic device):
84 mGy
Number of Acquired Spot Images: 0

[Series 1: run · 7 acquisitions, 15 frames shown]
[im 1/7]
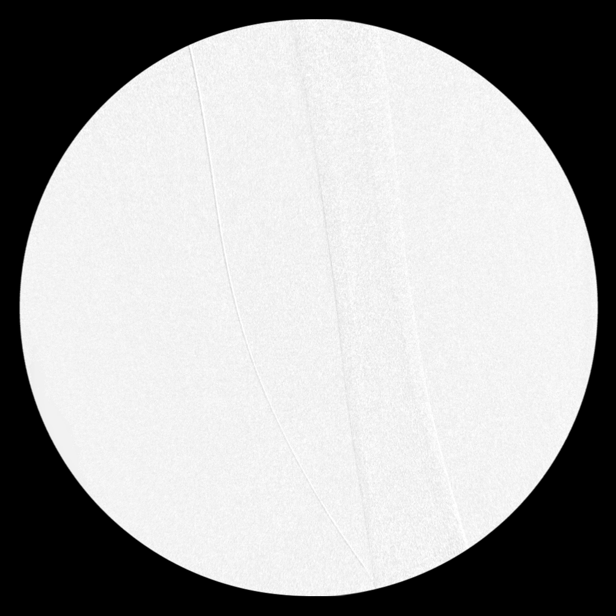
[im 1/7]
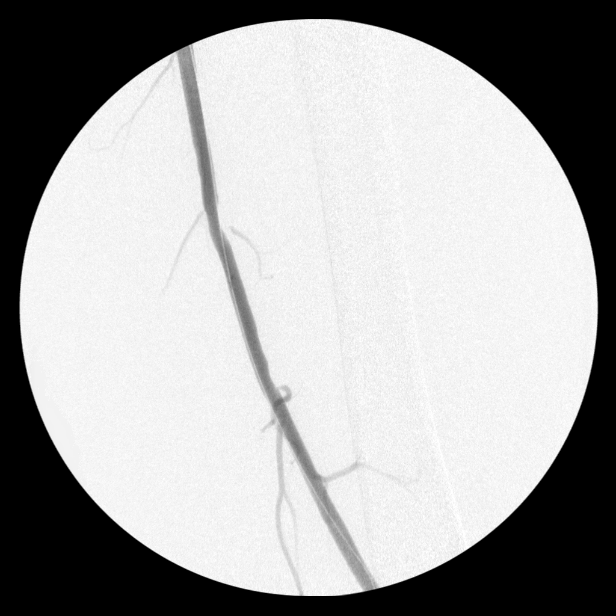
[im 2/7]
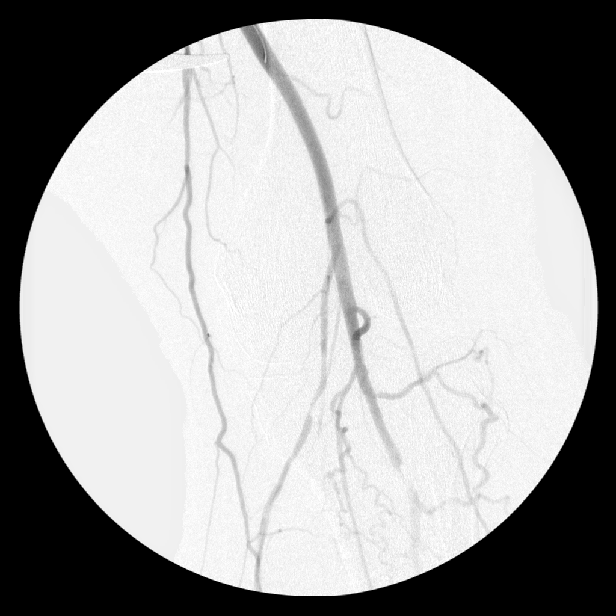
[im 2/7]
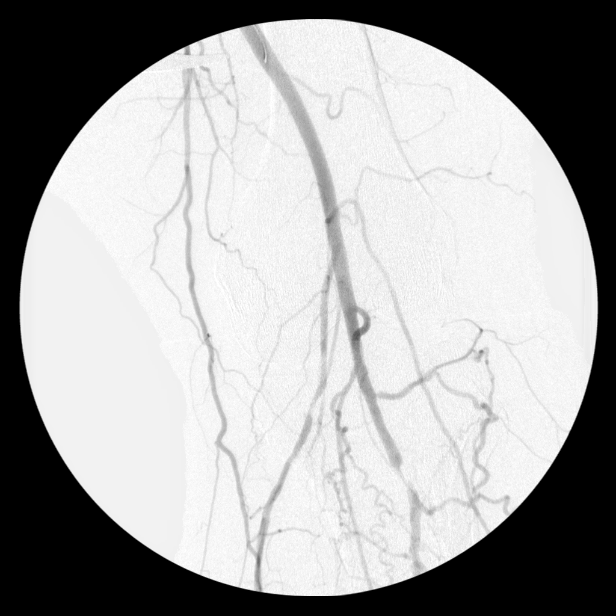
[im 3/7]
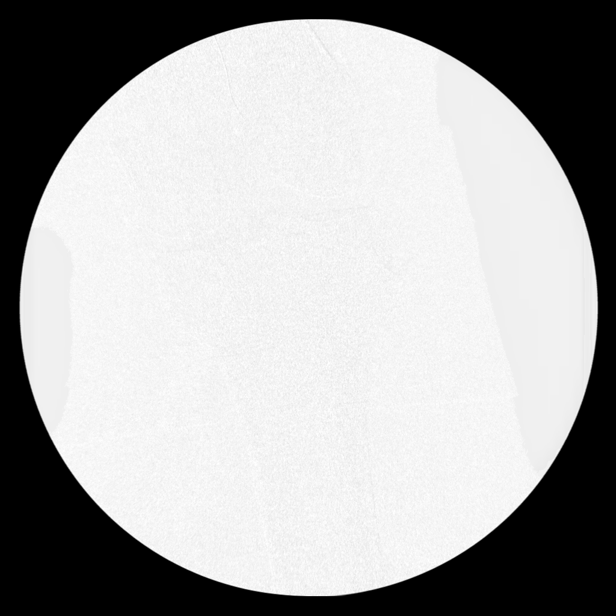
[im 3/7]
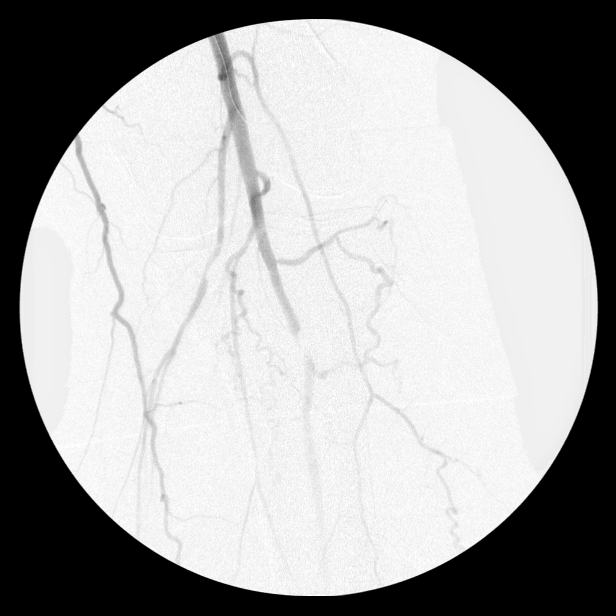
[im 4/7]
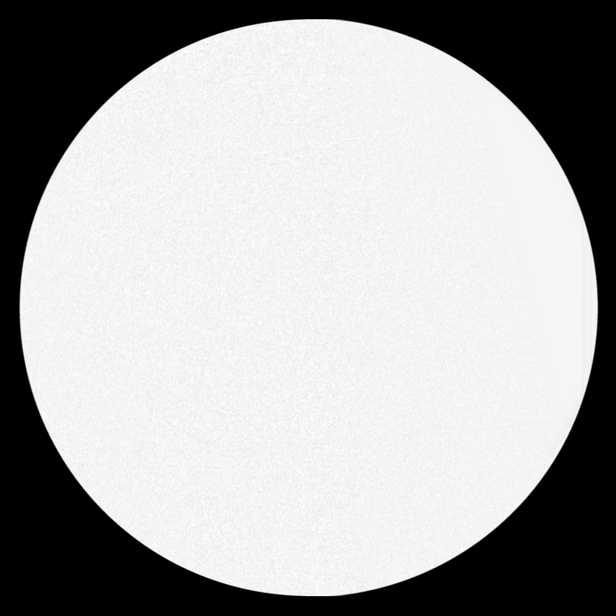
[im 4/7]
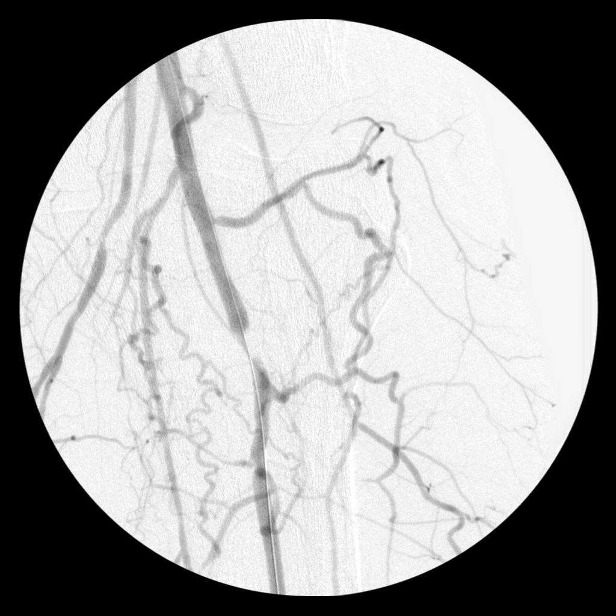
[im 4/7]
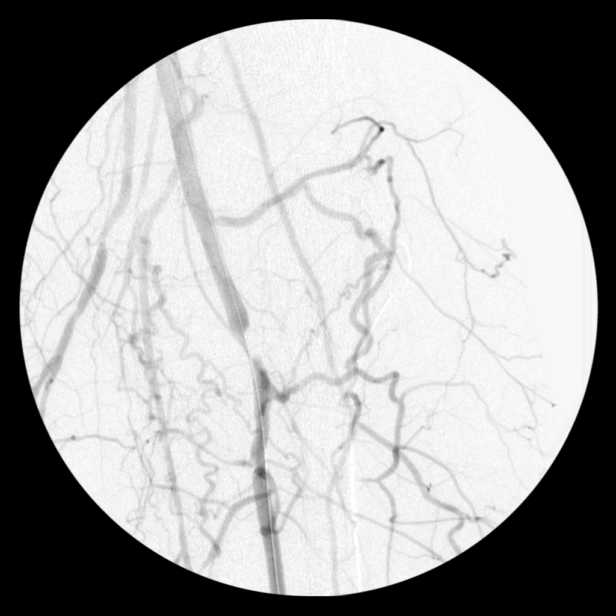
[im 5/7]
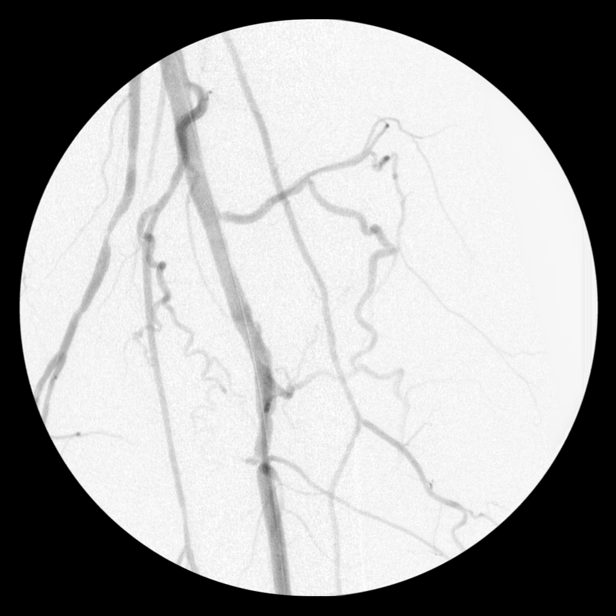
[im 5/7]
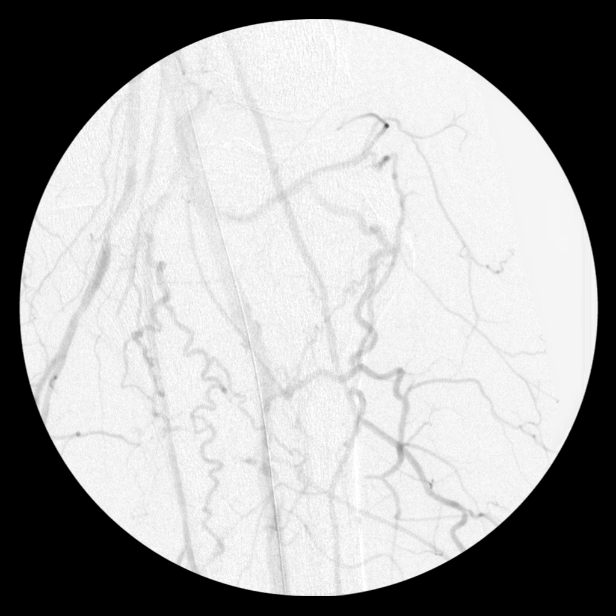
[im 6/7]
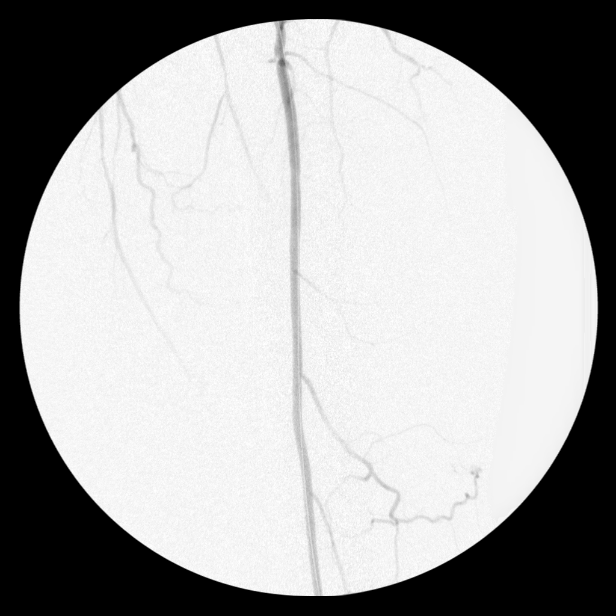
[im 6/7]
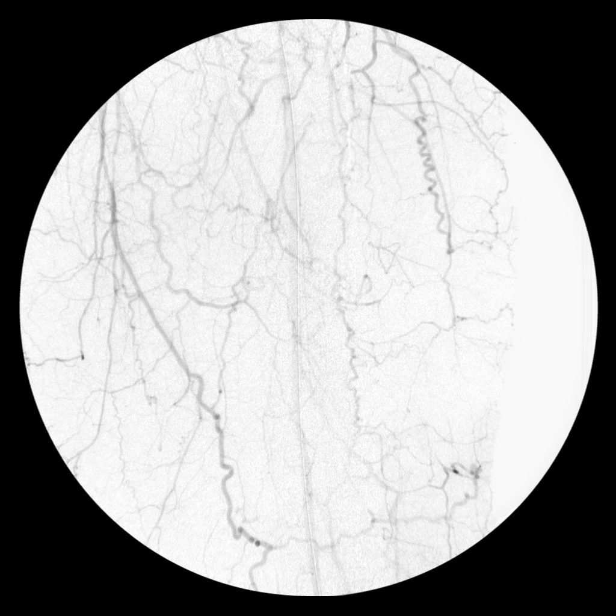
[im 7/7]
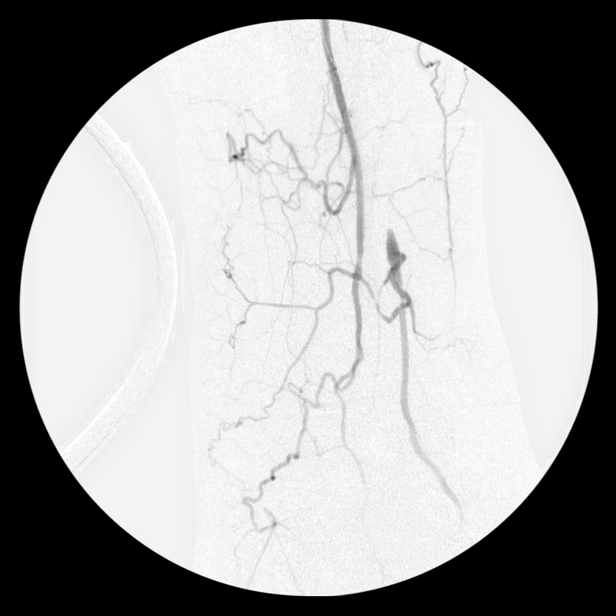
[im 7/7]
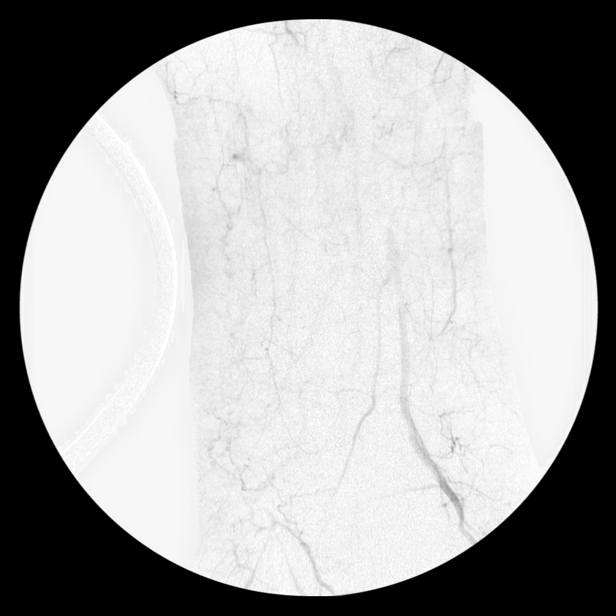

[15 of 24 positions shown; findings below may reference images not displayed]

FINDINGS: Seven intraoperative cine clips were provided for interpretation.
The submitted images demonstrate left lower extremity angiogram.
There is high-grade focal stenosis of the distal popliteal artery
which significantly following intervention. Single-vessel runoff to
the ankle likely through peroneal artery.
IMPRESSION: Intraoperative images of left lower extremity angiogram as above.

## 2021-02-19 SURGERY — AMPUTATION, FOOT, TRANSMETATARSAL
Anesthesia: General | Site: Leg Lower | Laterality: Right

## 2021-02-19 MED ORDER — ONDANSETRON HCL 4 MG/2ML IJ SOLN: 4.0000 mg | Freq: Once | INTRAMUSCULAR | Status: DC | PRN

## 2021-02-19 MED ORDER — ONDANSETRON HCL 4 MG/2ML IJ SOLN
INTRAMUSCULAR | Status: AC
  Filled 2021-02-19: qty 2

## 2021-02-19 MED ORDER — AMISULPRIDE (ANTIEMETIC) 5 MG/2ML IV SOLN: 10.0000 mg | Freq: Once | INTRAVENOUS | Status: DC | PRN

## 2021-02-19 MED ORDER — FENTANYL CITRATE (PF) 250 MCG/5ML IJ SOLN
INTRAMUSCULAR | Status: AC
  Filled 2021-02-19: qty 5

## 2021-02-19 MED ORDER — PROPOFOL 10 MG/ML IV BOLUS
INTRAVENOUS | Status: DC | PRN
  Administered 2021-02-19: 30 mg via INTRAVENOUS
  Administered 2021-02-19: 150 mg via INTRAVENOUS

## 2021-02-19 MED ORDER — 0.9 % SODIUM CHLORIDE (POUR BTL) OPTIME
TOPICAL | Status: DC | PRN
  Administered 2021-02-19: 2000 mL

## 2021-02-19 MED ORDER — FENTANYL CITRATE (PF) 100 MCG/2ML IJ SOLN
25.0000 ug | INTRAMUSCULAR | Status: DC | PRN
  Administered 2021-02-19 (×2): 50 ug via INTRAVENOUS

## 2021-02-19 MED ORDER — LIDOCAINE 2% (20 MG/ML) 5 ML SYRINGE
INTRAMUSCULAR | Status: DC | PRN
  Administered 2021-02-19: 100 mg via INTRAVENOUS

## 2021-02-19 MED ORDER — FENTANYL CITRATE (PF) 100 MCG/2ML IJ SOLN
INTRAMUSCULAR | Status: AC
  Filled 2021-02-19: qty 2

## 2021-02-19 MED ORDER — DEXAMETHASONE SODIUM PHOSPHATE 10 MG/ML IJ SOLN
INTRAMUSCULAR | Status: DC | PRN
  Administered 2021-02-19: 5 mg via INTRAVENOUS

## 2021-02-19 MED ORDER — ROCURONIUM BROMIDE 10 MG/ML (PF) SYRINGE
PREFILLED_SYRINGE | INTRAVENOUS | Status: DC | PRN
  Administered 2021-02-19: 100 mg via INTRAVENOUS
  Administered 2021-02-19: 20 mg via INTRAVENOUS

## 2021-02-19 MED ORDER — MIDAZOLAM HCL 2 MG/2ML IJ SOLN
INTRAMUSCULAR | Status: AC
  Filled 2021-02-19: qty 2

## 2021-02-19 MED ORDER — BACITRACIN ZINC 500 UNIT/GM EX OINT
TOPICAL_OINTMENT | CUTANEOUS | Status: AC
  Filled 2021-02-19: qty 28.35

## 2021-02-19 MED ORDER — CHLORHEXIDINE GLUCONATE CLOTH 2 % EX PADS
6.0000 | MEDICATED_PAD | Freq: Every day | CUTANEOUS | Status: DC
  Administered 2021-02-20: 6 via TOPICAL

## 2021-02-19 MED ORDER — IODIXANOL 320 MG/ML IV SOLN
INTRAVENOUS | Status: DC | PRN
  Administered 2021-02-19: 46 mL via INTRA_ARTERIAL

## 2021-02-19 MED ORDER — MORPHINE SULFATE (PF) 2 MG/ML IV SOLN
2.0000 mg | INTRAVENOUS | Status: DC | PRN
  Administered 2021-02-19 – 2021-02-21 (×4): 2 mg via INTRAVENOUS
  Filled 2021-02-19 (×4): qty 1

## 2021-02-19 MED ORDER — PROTAMINE SULFATE 10 MG/ML IV SOLN
INTRAVENOUS | Status: DC | PRN
  Administered 2021-02-19: 20 mg via INTRAVENOUS
  Administered 2021-02-19: 30 mg via INTRAVENOUS

## 2021-02-19 MED ORDER — SODIUM CHLORIDE 0.9% IV SOLUTION: Freq: Once | INTRAVENOUS | Status: DC

## 2021-02-19 MED ORDER — LACTATED RINGERS IV SOLN: INTRAVENOUS | Status: DC | PRN

## 2021-02-19 MED ORDER — ACETAMINOPHEN 10 MG/ML IV SOLN
1000.0000 mg | Freq: Once | INTRAVENOUS | Status: DC | PRN
  Administered 2021-02-19: 1000 mg via INTRAVENOUS

## 2021-02-19 MED ORDER — PROPOFOL 10 MG/ML IV BOLUS
INTRAVENOUS | Status: AC
  Filled 2021-02-19: qty 20

## 2021-02-19 MED ORDER — HEPARIN 6000 UNIT IRRIGATION SOLUTION
Status: AC
  Filled 2021-02-19: qty 500

## 2021-02-19 MED ORDER — HEPARIN SODIUM (PORCINE) 1000 UNIT/ML IJ SOLN
INTRAMUSCULAR | Status: AC
  Filled 2021-02-19: qty 10

## 2021-02-19 MED ORDER — ROCURONIUM BROMIDE 10 MG/ML (PF) SYRINGE
PREFILLED_SYRINGE | INTRAVENOUS | Status: AC
  Filled 2021-02-19: qty 10

## 2021-02-19 MED ORDER — LIDOCAINE 2% (20 MG/ML) 5 ML SYRINGE
INTRAMUSCULAR | Status: AC
  Filled 2021-02-19: qty 5

## 2021-02-19 MED ORDER — PHENYLEPHRINE 40 MCG/ML (10ML) SYRINGE FOR IV PUSH (FOR BLOOD PRESSURE SUPPORT)
PREFILLED_SYRINGE | INTRAVENOUS | Status: DC | PRN
  Administered 2021-02-19: 40 ug via INTRAVENOUS
  Administered 2021-02-19: 120 ug via INTRAVENOUS
  Administered 2021-02-19 (×2): 80 ug via INTRAVENOUS

## 2021-02-19 MED ORDER — SUGAMMADEX SODIUM 200 MG/2ML IV SOLN
INTRAVENOUS | Status: DC | PRN
  Administered 2021-02-19: 200 mg via INTRAVENOUS

## 2021-02-19 MED ORDER — FENTANYL CITRATE (PF) 250 MCG/5ML IJ SOLN
INTRAMUSCULAR | Status: DC | PRN
  Administered 2021-02-19 (×4): 50 ug via INTRAVENOUS
  Administered 2021-02-19: 100 ug via INTRAVENOUS
  Administered 2021-02-19: 50 ug via INTRAVENOUS

## 2021-02-19 MED ORDER — MIDAZOLAM HCL 2 MG/2ML IJ SOLN
INTRAMUSCULAR | Status: DC | PRN
  Administered 2021-02-19: 2 mg via INTRAVENOUS

## 2021-02-19 MED ORDER — PROTAMINE SULFATE 10 MG/ML IV SOLN
INTRAVENOUS | Status: AC
  Filled 2021-02-19: qty 5

## 2021-02-19 MED ORDER — ONDANSETRON HCL 4 MG/2ML IJ SOLN
INTRAMUSCULAR | Status: DC | PRN
  Administered 2021-02-19: 4 mg via INTRAVENOUS

## 2021-02-19 MED ORDER — ACETAMINOPHEN 10 MG/ML IV SOLN
INTRAVENOUS | Status: AC
  Filled 2021-02-19: qty 100

## 2021-02-19 MED ORDER — OXYCODONE-ACETAMINOPHEN 5-325 MG PO TABS
1.0000 | ORAL_TABLET | ORAL | Status: DC | PRN
Start: 2021-02-19 — End: 2021-02-23
  Administered 2021-02-19 – 2021-02-22 (×6): 2 via ORAL
  Administered 2021-02-22: 1 via ORAL
  Administered 2021-02-23: 2 via ORAL
  Filled 2021-02-19 (×6): qty 2
  Filled 2021-02-19: qty 1
  Filled 2021-02-19 (×2): qty 2

## 2021-02-19 MED ORDER — PHENYLEPHRINE HCL-NACL 20-0.9 MG/250ML-% IV SOLN
INTRAVENOUS | Status: DC | PRN
  Administered 2021-02-19: 30 ug/min via INTRAVENOUS

## 2021-02-19 MED ORDER — DEXAMETHASONE SODIUM PHOSPHATE 10 MG/ML IJ SOLN
INTRAMUSCULAR | Status: AC
  Filled 2021-02-19: qty 1

## 2021-02-19 MED ORDER — HEPARIN SODIUM (PORCINE) 1000 UNIT/ML IJ SOLN
INTRAMUSCULAR | Status: DC | PRN
  Administered 2021-02-19: 10000 [IU] via INTRAVENOUS

## 2021-02-19 SURGICAL SUPPLY — 80 items
BAG COUNTER SPONGE SURGICOUNT (BAG) ×5 IMPLANT
BAG ISL DRAPE 18X18 STRL (DRAPES) ×8
BAG ISOLATION DRAPE 18X18 (DRAPES) ×2 IMPLANT
BAG SPNG CNTER NS LX DISP (BAG) ×4
BALLN STERLING OTW 4X20X135 (BALLOONS) ×5
BALLOON STERLING OTW 4X20X135 (BALLOONS) ×1 IMPLANT
BLADE AVERAGE 25X9 (BLADE) ×2 IMPLANT
BLADE SURG 21 STRL SS (BLADE) ×7 IMPLANT
BNDG COHESIVE 4X5 TAN ST LF (GAUZE/BANDAGES/DRESSINGS) ×4 IMPLANT
BNDG CONFORM 3 STRL LF (GAUZE/BANDAGES/DRESSINGS) ×5 IMPLANT
BNDG ELASTIC 4X5.8 VLCR STR LF (GAUZE/BANDAGES/DRESSINGS) ×5 IMPLANT
BNDG GAUZE ELAST 4 BULKY (GAUZE/BANDAGES/DRESSINGS) ×7 IMPLANT
CANISTER SUCT 3000ML PPV (MISCELLANEOUS) ×5 IMPLANT
CANISTER WOUND CARE 500ML ATS (WOUND CARE) ×2 IMPLANT
CATH CXI SUPP 2.6F 150 ANG (CATHETERS) ×2 IMPLANT
CATH OMNI FLUSH .035X70CM (CATHETERS) ×2 IMPLANT
CLOSURE PERCLOSE PROSTYLE (VASCULAR PRODUCTS) ×2 IMPLANT
CNTNR URN SCR LID CUP LEK RST (MISCELLANEOUS) ×1 IMPLANT
CONT SPEC 4OZ STRL OR WHT (MISCELLANEOUS) ×5
COVER PROBE W GEL 5X96 (DRAPES) ×2 IMPLANT
COVER SURGICAL LIGHT HANDLE (MISCELLANEOUS) ×7 IMPLANT
DEVICE INFLATION ENCORE 26 (MISCELLANEOUS) ×2 IMPLANT
DRAPE HALF SHEET 40X57 (DRAPES) ×9 IMPLANT
DRAPE INCISE IOBAN 66X45 STRL (DRAPES) ×2 IMPLANT
DRAPE ISOLATION BAG 18X18 (DRAPES) ×2
DRAPE ORTHO SPLIT 77X108 STRL (DRAPES) ×10
DRAPE SURG ORHT 6 SPLT 77X108 (DRAPES) ×8 IMPLANT
DRSG TEGADERM 4X4.75 (GAUZE/BANDAGES/DRESSINGS) ×2 IMPLANT
DRSG VAC ATS MED SENSATRAC (GAUZE/BANDAGES/DRESSINGS) ×2 IMPLANT
ELECT CAUTERY BLADE 6.4 (BLADE) ×2 IMPLANT
ELECT REM PT RETURN 9FT ADLT (ELECTROSURGICAL) ×5
ELECTRODE REM PT RTRN 9FT ADLT (ELECTROSURGICAL) ×4 IMPLANT
GAUZE 4X4 16PLY ~~LOC~~+RFID DBL (SPONGE) ×2 IMPLANT
GAUZE SPONGE 4X4 12PLY STRL (GAUZE/BANDAGES/DRESSINGS) ×5 IMPLANT
GAUZE XEROFORM 1X8 LF (GAUZE/BANDAGES/DRESSINGS) ×5 IMPLANT
GAUZE XEROFORM 5X9 LF (GAUZE/BANDAGES/DRESSINGS) ×2 IMPLANT
GLIDEWIRE ADV .035X180CM (WIRE) ×2 IMPLANT
GLOVE SURG MICRO LTX SZ6 (GLOVE) ×2 IMPLANT
GLOVE SURG POLYISO LF SZ7 (GLOVE) ×2 IMPLANT
GLOVE SURG POLYISO LF SZ8 (GLOVE) ×5 IMPLANT
GOWN STRL REUS W/ TWL LRG LVL3 (GOWN DISPOSABLE) ×8 IMPLANT
GOWN STRL REUS W/ TWL XL LVL3 (GOWN DISPOSABLE) ×4 IMPLANT
GOWN STRL REUS W/TWL LRG LVL3 (GOWN DISPOSABLE) ×10
GOWN STRL REUS W/TWL XL LVL3 (GOWN DISPOSABLE) ×5
GRAFT MYRIAD 3 LAYER 10X10 (Graft) ×4 IMPLANT
KIT BASIN OR (CUSTOM PROCEDURE TRAY) ×5 IMPLANT
KIT MICROPUNCTURE NIT STIFF (SHEATH) ×2 IMPLANT
KIT TURNOVER KIT B (KITS) ×5 IMPLANT
LOOP VESSEL MAXI BLUE (MISCELLANEOUS) ×2 IMPLANT
LOOP VESSEL MINI RED (MISCELLANEOUS) ×2 IMPLANT
NS IRRIG 1000ML POUR BTL (IV SOLUTION) ×5 IMPLANT
PACK GENERAL/GYN (CUSTOM PROCEDURE TRAY) ×5 IMPLANT
PAD ARMBOARD 7.5X6 YLW CONV (MISCELLANEOUS) ×10 IMPLANT
POWDER MYRIAD MORCELLS 1000MG (Miscellaneous) ×2 IMPLANT
SHEATH PINNACLE 6F 10CM (SHEATH) ×2 IMPLANT
SHEATH PINNACLE ST 6F 45CM (SHEATH) ×2 IMPLANT
SPONGE T-LAP 18X18 ~~LOC~~+RFID (SPONGE) ×4 IMPLANT
STAPLER VISISTAT 35W (STAPLE) ×2 IMPLANT
STOPCOCK 4 WAY LG BORE MALE ST (IV SETS) ×2 IMPLANT
SUT CHROMIC 3 0 SH 27 (SUTURE) ×4 IMPLANT
SUT ETHILON 2 0 PSLX (SUTURE) ×5 IMPLANT
SUT ETHILON 3 0 PS 1 (SUTURE) IMPLANT
SUT SILK 2 0 (SUTURE) ×5
SUT SILK 2-0 18XBRD TIE 12 (SUTURE) ×1 IMPLANT
SUT SILK 3 0 (SUTURE) ×5
SUT SILK 3-0 18XBRD TIE 12 (SUTURE) ×1 IMPLANT
SUT SILK 4 0 (SUTURE) ×5
SUT SILK 4-0 18XBRD TIE 12 (SUTURE) ×1 IMPLANT
SUT VIC AB 2-0 CT1 18 (SUTURE) ×2 IMPLANT
SYR 10ML LL (SYRINGE) ×4 IMPLANT
SYR 20ML LL LF (SYRINGE) ×2 IMPLANT
SYR 30ML LL (SYRINGE) ×4 IMPLANT
TOWEL GREEN STERILE (TOWEL DISPOSABLE) ×10 IMPLANT
TOWEL GREEN STERILE FF (TOWEL DISPOSABLE) ×5 IMPLANT
TRAY FOLEY MTR SLVR 16FR STAT (SET/KITS/TRAYS/PACK) ×2 IMPLANT
TUBING EXTENTION W/L.L. (IV SETS) ×2 IMPLANT
UNDERPAD 30X36 HEAVY ABSORB (UNDERPADS AND DIAPERS) ×5 IMPLANT
WATER STERILE IRR 1000ML POUR (IV SOLUTION) ×5 IMPLANT
WIRE G V18X300CM (WIRE) ×2 IMPLANT
WIRE STARTER BENTSON 035X150 (WIRE) ×2 IMPLANT

## 2021-02-19 NOTE — Progress Notes (Signed)
Pharmacy Antibiotic Note  Adam Perez is a 65 y.o. male admitted on 02/14/2021 with  wound infections .  Pharmacy has been consulted for vancomycin dosing.  Also on Flagyl and ceftriaxone.  Vancomycin started 12/9 - estimated AUC 489 based on Scr 1.09.  Renal function remains stable, s/p OR today for revascularization of L left leg ulcer and TMA.  Plan: Continue vancomycin 1500 mg q 24 hrs for now. Will plan to check level at steady state in next day or two. F/u cultures, clinical course, LOT.  Height: 5\' 7"  (170.2 cm) Weight: 109.3 kg (240 lb 15.4 oz) IBW/kg (Calculated) : 66.1  Temp (24hrs), Avg:97.8 F (36.6 C), Min:97 F (36.1 C), Max:98.5 F (36.9 C)  Recent Labs  Lab 02/14/21 1317 02/14/21 1510 02/15/21 0045 02/16/21 0435 02/19/21 0602 02/19/21 0603  WBC 7.7  --  8.4 6.9  --  6.3  CREATININE 1.31*  --  1.19 1.09  1.10 1.07  --   LATICACIDVEN 2.5* 2.4*  --   --   --   --     Estimated Creatinine Clearance: 80.1 mL/min (by C-G formula based on SCr of 1.07 mg/dL).    Not on File  Antimicrobials this admission: Vanco 12/7 >> CTX 12/7 >> (12/13) Flagyl 12/7 >> (12/14)   Microbiology results: 12/7 BCx: neg   Thank you for allowing pharmacy to be a part of this patient's care.  14/7, Reece Leader, BCCP Clinical Pharmacist  02/19/2021 1:43 PM   [facility] pharmacy phone numbers are listed on [email or web address]

## 2021-02-19 NOTE — Progress Notes (Signed)
Orthopedic Tech Progress Note Patient Details:  Adam Perez 08/30/1955 [id number]  Ortho Devices Type of Ortho Device: Darco shoe Ortho Device/Splint Location: LLE Ortho Device/Splint Interventions: Ordered   Post Interventions Patient Tolerated: Poor Instructions Provided: Care of device  [name] 02/19/2021, 3:08 PM

## 2021-02-19 NOTE — Anesthesia Procedure Notes (Signed)
Arterial Line Insertion Start/End12/02/2021 7:10 AM, 02/19/2021 7:15 AM Performed by: Nils Pyle, CRNA, CRNA  Patient location: Pre-op. Preanesthetic checklist: patient identified, IV checked, site marked, risks and benefits discussed, surgical consent, monitors and equipment checked, pre-op evaluation and anesthesia consent Lidocaine 1% used for infiltration Right, radial was placed Catheter size: 20 G Hand hygiene performed  and maximum sterile barriers used   Attempts: 1 Procedure performed without using ultrasound guided technique. Ultrasound Notes:anatomy identified, needle tip was noted to be adjacent to the nerve/plexus identified and no ultrasound evidence of intravascular and/or intraneural injection Following insertion, dressing applied and Biopatch. Post procedure assessment: normal and unchanged  Patient tolerated the procedure well with no immediate complications.

## 2021-02-19 NOTE — Anesthesia Postprocedure Evaluation (Signed)
Anesthesia Post Note  Patient: Adam Perez  Procedure(s) Performed: LEFT TRANSMETATARSAL AMPUTATION (Left: Foot) LEFT CALF VENOUS ULCER SHARP EXCISIONAL DEBRIDEMENT (Left: Leg Lower) APPLICATION OF SKIN SUBSTITUTE (Left: Leg Lower) POPLITEAL ANGIOPLASTY (Left: Leg Lower) LEFT LOWER EXTREMITY ANGIOGRAM (Left: Leg Lower) ULTRASOUND GUIDANCE FOR RIGHT COMMON FEMORAL ARTERY ACCESS (Right: Groin)     Patient location during evaluation: PACU Anesthesia Type: General Level of consciousness: awake Pain management: pain level controlled Vital Signs Assessment: post-procedure vital signs reviewed and stable Respiratory status: spontaneous breathing, nonlabored ventilation, respiratory function stable and patient connected to nasal cannula oxygen Cardiovascular status: blood pressure returned to baseline and stable Postop Assessment: no apparent nausea or vomiting Anesthetic complications: no   No notable events documented.  Last Vitals:  Vitals:   02/19/21 1304 02/19/21 1513  BP: 133/86 123/80  Pulse: 80 83  Resp: 20 18  Temp: 36.7 C (!) 36.4 C  SpO2: 100% 100%    Last Pain:  Vitals:   02/19/21 1513  TempSrc: Oral  PainSc:                  Adam Perez

## 2021-02-19 NOTE — Op Note (Signed)
DATE OF SERVICE: 02/19/2021  PATIENT:  [name]  65 y.o. male  PRE-OPERATIVE DIAGNOSIS:  atherosclerosis of native arteries of left lower extremity causing diabetic foot infection and venous ulceration  POST-OPERATIVE DIAGNOSIS:  Same  PROCEDURE:   1) Ultrasound guided right common femoral artery access 2) Left lower extremity angiography with third order cannulation 3) Left popliteal angioplasty (4x334m Sterling) 4) Left transmetatarsal amputation 5) Left calf venous ulcer sharp excisional debridement (8 x 16 x 166m 6) Application of skin substitute to left calf wound (8 x 16 x 34m87m SURGEON:  Surgeon(s) and Role:    * HawCherre RobinsD - Primary  ASSISTANT: none  ANESTHESIA:   general  EBL: 250m44mLOOD ADMINISTERED:none  DRAINS: none   LOCAL MEDICATIONS USED:  NONE  SPECIMEN:  none  COUNTS: confirmed correct.  TOURNIQUET:  none  PATIENT DISPOSITION:  PACU - hemodynamically stable.   Delay start of Pharmacological VTE agent (>24hrs) due to surgical blood loss or risk of bleeding: no  INDICATION FOR PROCEDURE: DonaJAWON DIPIEROa 65 y62. male with atherosclerosis of left lower extremity, left diabetic foot infection and left lateral calf venous ulceration. After careful discussion of risks, benefits, and alternatives the patient was offered popliteal angioplasty, transmetatarsal amputation, venous ulcer debridement.  The patient understood and wished to proceed.  OPERATIVE FINDINGS: Focal stenosis of the below-knee popliteal artery reconfirmed.  Successful treatment of greater than 90% stenosis with plain balloon angioplasty (4 x 20 mm Sterling).  Unremarkable transmetatarsal amputation.  Good healthy bleeding at the amputation margin.  Venous ulcer debrided sharply.  Myriad skin substitute applied to venous ulcer wound.  DESCRIPTION OF PROCEDURE: After identification of the patient in the pre-operative holding area, the patient was transferred to the operating  room. The patient was positioned supine on the operating room table. Anesthesia was induced. The bilateral groins and left leg were prepped and draped in standard fashion. A surgical pause was performed confirming correct patient, procedure, and operative location.  The right groin was anesthetized with subcutaneous injection of 1% lidocaine. Using ultrasound guidance, the right common femoral artery was accessed with micropuncture technique. Fluoroscopy was used to confirm cannulation over the femoral head. The 68F sheath was upsized to 62F.   A Benson wire was advanced into the distal aorta. Over the wire an omni flush catheter was advanced into the aorta. The left common iliac artery was selected with a glidewire advantage guidewire. The wire was advanced into the superficial femoral artery. A 62F x 45cm sheath was advanced into the left superifical femoral artery. Selective angiography was performed - see above for details.   The decision was made to intervene. The patient was heparinized with 10,000 units of heparin. The popliteal lesion was treated with balloon angioplasty (4x20mm98mrling). Completion angiography revealed resolution of the lesion. Peroneal only runoff to the foot.  Perclose was used to close the arteriotomy. Hemostasis was excellent upon completion.  A generous fishmouth incision was made about the left midfoot to allow for healthy plantar flap.  Incision was carried down through subcutaneous tissue and tendon until bone was encountered circumferentially.  The metatarsals were amputated with a powered reticulating saw.  The forefoot was removed taking great care to maintain a thick posterior flap..  The specimen was passed off the table.  The wound was copiously irrigated.  Hemostasis was achieved in the surgical bed.  The wound was closed in layers using 2-0 Vicryl.  The skin was closed with  2-0 Vicryl and the surgical stapler.  A sterile bandage of Xeroform, 4 x 4, Kerlix, Coban was  applied.  A 8 x 16 x 1 cm venous stasis ulcer was noted on the left lateral/posterior calf.  Sharp, tangential, excisional debridement was performed of the entire area to healthy bleeding tissue.  2 patches of 10 x 10 myriad matrix were brought onto the field to cover the defect.  The patches were partially sewn to the wound using chromic suture.  1000 mg of myriad morsels into the wound.  The skin substitute was then completely sutured to the wound edges.  Xeroform was applied over the wound.  K-Y jelly was applied over the Xeroform.  A medium black VAC sponge was applied over this.  The VAC was secured with a single strip of Ioban.  Coban was wrapped around the leg to help with decongestion of the leg.  Upon completion of the case instrument and sharps counts were confirmed correct. The patient was transferred to the PACU in good condition. I was present for all portions of the procedure.  Yevonne Aline. Stanford Breed, MD [facility] Phone Number: [phone] 02/19/2021 10:53 AM

## 2021-02-19 NOTE — TOC Initial Note (Signed)
Transition of Care (TOC) - Initial/Assessment Note  Donn Pierini RN, BSN Transitions of Care Unit 4E- RN Case Manager See Treatment Team for direct phone #    Patient Details  Name: Adam Perez MRN: [id number] Date of Birth: 1956-02-11  Transition of Care [facility]) CM/SW Contact:    Darrold Span, RN Phone Number: 02/19/2021, 1:45 PM  Clinical Narrative:                 Pt admitted from home with infected wound, to OR today for vascular procedure. TOC to follow for transition needs- noted initial PT/OT recommendations for [facility].   Expected Discharge Plan: Home w Home Health Services Barriers to Discharge: Continued Medical Work up   Patient Goals and CMS Choice    To f/u prior to discharge    Expected Discharge Plan and Services Expected Discharge Plan: Home w Home Health Services       Living arrangements for the past 2 months: Single Family Home                                      Prior Living Arrangements/Services Living arrangements for the past 2 months: Single Family Home Lives with:: Spouse Patient language and need for interpreter reviewed:: Yes        Need for Family Participation in Patient Care: Yes (Comment) Care giver support system in place?: Yes (comment)   Criminal Activity/Legal Involvement Pertinent to Current Situation/Hospitalization: No - Comment as needed  Activities of Daily Living Home Assistive Devices/Equipment: Cane (specify quad or straight) ADL Screening (condition at time of admission) Patient's cognitive ability adequate to safely complete daily activities?: Yes Is the patient deaf or have difficulty hearing?: No Does the patient have difficulty seeing, even when wearing glasses/contacts?: No Does the patient have difficulty concentrating, remembering, or making decisions?: No Patient able to express need for assistance with ADLs?: Yes Does the patient have difficulty dressing or bathing?: Yes Independently performs  ADLs?: No Communication: Independent Dressing (OT): Needs assistance Is this a change from baseline?: Change from baseline, expected to last >3 days Grooming: Needs assistance Is this a change from baseline?: Change from baseline, expected to last >3 days Feeding: Independent Bathing: Needs assistance Is this a change from baseline?: Change from baseline, expected to last >3 days Toileting: Needs assistance Is this a change from baseline?: Change from baseline, expected to last >3days In/Out Bed: Needs assistance Is this a change from baseline?: Change from baseline, expected to last >3 days Walks in Home: Needs assistance Is this a change from baseline?: Change from baseline, expected to last >3 days Does the patient have difficulty walking or climbing stairs?: Yes Weakness of Legs: Left Weakness of Arms/Hands: Left  Permission Sought/Granted                  Emotional Assessment       Orientation: : Oriented to Self, Oriented to Place, Oriented to  Time, Oriented to Situation Alcohol / Substance Use: Not Applicable Psych Involvement: No (comment)  Admission diagnosis:  Dehydration [E86.0] Infected wound [T14.8XXA, L08.9] Patient Active Problem List   Diagnosis Date Noted   Generalized anxiety disorder 02/15/2021   Major depressive disorder, recurrent severe without psychotic features (HCC) 02/15/2021   Traumatic brain injury with loss of consciousness (HCC) 02/15/2021   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)  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)    PVD (peripheral vascular disease) (HCC)    Infected wound 02/14/2021   Hyperglycemia 07/06/2019   History of ischemic cerebrovascular accident (CVA) with residual deficit 07/06/2019   Type 2 diabetes mellitus with unspecified complications (HCC) 07/06/2019   DKA (diabetic ketoacidoses) 07/05/2019   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

## 2021-02-19 NOTE — Progress Notes (Signed)
  Progress Note    02/19/2021 7:24 AM Day of Surgery  Subjective:  No new complaints this morning   Vitals:   02/18/21 2309 02/19/21 0406  BP: (!) 132/92 121/85  Pulse: 89 84  Resp: 17 19  Temp: 98.5 F (36.9 C) 98 F (36.7 C)  SpO2: 97% 97%   Physical Exam: Lungs:  non labored Incisions:  R groin cath site without hematoma Extremities:  dressing left in place L leg and foot Neurologic: A&O  CBC    Component Value Date/Time   WBC 6.3 02/19/2021 0603   RBC 3.12 (L) 02/19/2021 0603   HGB 8.9 (L) 02/19/2021 0603   HCT 27.0 (L) 02/19/2021 0603   PLT 366 02/19/2021 0603   MCV 86.5 02/19/2021 0603   MCH 28.5 02/19/2021 0603   MCHC 33.0 02/19/2021 0603   RDW 15.9 (H) 02/19/2021 0603   LYMPHSABS 1.2 02/14/2021 1317   MONOABS 0.5 02/14/2021 1317   EOSABS 0.0 02/14/2021 1317   BASOSABS 0.1 02/14/2021 1317    BMET    Component Value Date/Time   NA 140 02/16/2021 0435   K 3.9 02/16/2021 0435   CL 107 02/16/2021 0435   CO2 21 (L) 02/16/2021 0435   GLUCOSE 118 (H) 02/16/2021 0435   BUN 16 02/16/2021 0435   CREATININE 1.10 02/16/2021 0435   CREATININE 1.09 02/16/2021 0435   CALCIUM 8.9 02/16/2021 0435   GFRNONAA >60 02/16/2021 0435   GFRNONAA >60 02/16/2021 0435   GFRAA >60 07/06/2019 0407   GFRAA >60 07/06/2019 0407    INR    Component Value Date/Time   INR 1.1 02/14/2021 1344     Intake/Output Summary (Last 24 hours) at 02/19/2021 0724 Last data filed at 02/19/2021 0407 Gross per 24 hour  Intake 720 ml  Output 1975 ml  Net -1255 ml      Assessment/Plan:  65 y.o. male with CLI LLE Day of Surgery   Unable to tolerate angiography with conscious sedation Plan is for OR tomorrow for open vs endovascular popliteal revascularization with debridement of left leg ulcer and TMA This procedure was discussed with the patient including risks and he is willing to proceed Consent ordered NPO  [name]. [name], MD [facility] Phone Number: [phone] 02/19/2021 7:25 AM

## 2021-02-19 NOTE — Progress Notes (Signed)
PROGRESS NOTE  Adam Perez EHU:[id number] DOB: 27-Feb-1956   PCP: [place]  Patient is from: Home.  Lives alone.  Uses cane at baseline.  Gets his care at the [place].  DOA: 02/14/2021 LOS: 5  Chief complaints:  Chief Complaint  Patient presents with   Knee Pain     Brief Narrative / Interim history: 65 year old M with PMH of CVA with left-sided spastic hemiparesis, IDDM-2, PVD, PTSD, GAD and neurocognitive deficits presenting with worsening left hip pain, malodorous wound in left foot and left lower extremity, and found to have infected left foot and leg diabetic wounds with cellulitis and left second and third toe osteomyelitis as noted on MRI.  Patient was started on IV vancomycin, ceftriaxone and Flagyl.  Vascular surgery and orthopedic surgery consulted.   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  Underwent LLE angiography, left popliteal angioplasty, left TMT and excisional debridement of left calf venous ulcer with a skin substitute to left calf wound on 02/19/2021.   Subjective: Seen and examined this afternoon after he returned from surgery.  No complaints.  Pain fairly controlled.  Patient's wife at bedside.  Objective: Vitals:   02/19/21 1230 02/19/21 1245 02/19/21 1304 02/19/21 1513  BP: 116/72 120/78 133/86 123/80  Pulse: 89 86 80 83  Resp: _0 Temp:   98 F (36.7 C) (!) 97.5 F (36.4 C)  TempSrc:   Oral Oral  SpO2: 95% 97% 100% 100%  Weight:      Height:        Intake/Output Summary (Last 24 hours) at 02/19/2021 1531 Last data filed at 02/19/2021 1412 Gross per 24 hour  Intake 2000 ml  Output 2825 ml  Net -825 ml   Filed Weights   02/14/21 2150 02/14/21 2249 02/15/21 1439  Weight: 108.6 kg 109.1 kg 109.3 kg    Examination:  GENERAL: No apparent distress.  Nontoxic. HEENT: MMM.  Vision and hearing grossly intact.  NECK:  Supple.  No apparent JVD.  RESP:  No IWOB.  Fair aeration bilaterally. CVS:  RRR. Heart sounds normal.  ABD/GI/GU: BS+. Abd soft, NTND.  MSK/EXT:  Moves extremities.  Dressing over LLE.   SKIN: Skin changes in keeping with venous insufficiency and lower extremity NEURO: Awake and alert. Oriented appropriately.  No apparent focal neuro deficit. PSYCH: Calm. Normal affect.   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  02/19/2021-Underwent LLE angiography, left popliteal angioplasty, left TMT and excisional debridement of left calf venous ulcer with a skin substitute to left calf wound  Microbiology summarized: COVID-19 and influenza PCR nonreactive. Blood cultures NGTD.  Assessment & Plan: Diabetic infected left foot and left leg infection/cellulitis/myositis Left second and third toe osteomyelitis Left lower extremity PAD -MRI suggestive of cellulitis, myositis and osteomyelitis. -S/ p LLE angiography, left popliteal angioplasty, left TMT and excisional debridement of left calf venous ulcer with a skin substitute to left calf wound on 02/19/2021 -CRP 1.4.  ESR 76. -Continue Vanco, CTX and Flagyl -Resume Plavix when okay from surgical standpoint -On Robaxin, Norco and IV morphine for pain control -Bowel regimen  Left hip pain/osteoarthritis: MRI suggest arthritis. -Pain control -PT/OT    Controlled IDDM-2 with diabetic foot infection/PAD/hyperglycemia: A1c 6.1%. Recent Labs  Lab 02/18/21 0631 02/18/21 1143 02/18/21 1628 02/18/21 2114 02/19/21 1117  GLUCAP 110* 99  103* 106* 126*  -Continue current insulin regimen -Continue home statin.   H/o Prior CVA with residual left-sided spastic hemiparesis -Continue aspirin and statin. -Hold home baclofen while on Robaxin -Hold Plavix   Essential hypertension: Normotensive. -Continue home meds.    PTSD/anxiety/depression/neurocognitive deficit: Stable. -continue with home meds   Morbid obesity with complication including diabetes, PAD... Body mass index is 37.74 kg/m. Nutrition Problem: Increased nutrient needs Etiology: wound healing (LLE wound) Signs/Symptoms: estimated needs Interventions: MVI, Prostat   DVT prophylaxis:  SCD's Start: 02/15/21 1523   On subcu Lovenox.  Code Status: Full code Family Communication: Updated patient's wife at bedside. Level of care: Med-Surg Status is: Inpatient  Remains inpatient appropriate because: IV antibiotics for infected venous ulcer, osteomyelitis and cellulitis    Consultants:  Orthopedic surgery Vascular surgery   Sch Meds:  Scheduled Meds:  (feeding supplement) PROSource Plus  30 mL Oral BID WC   sodium chloride   Intravenous Once   amLODipine  10 mg Oral Daily   aspirin EC  81 mg Oral Q breakfast   atorvastatin  40 mg Oral Daily   [START ON 02/20/2021] Chlorhexidine Gluconate Cloth  6 each Topical Q0600   enoxaparin (LOVENOX) injection  55 mg Subcutaneous QHS   fentaNYL       insulin aspart  0-15 Units Subcutaneous TID WC   insulin aspart  0-5 Units Subcutaneous QHS   insulin glargine-yfgn  10 Units Subcutaneous QHS   lurasidone  20 mg Oral Daily   multivitamin with minerals  1 tablet Oral Daily   sertraline  150 mg Oral Daily   traZODone  100 mg Oral QHS   Continuous Infusions:  acetaminophen     cefTRIAXone (ROCEPHIN)  IV Stopped (02/18/21 1908)   methocarbamol (ROBAXIN) IV Stopped (02/14/21 2125)   metronidazole 500 mg (02/19/21 0503)   vancomycin 1,500 mg (02/19/21 1507)   PRN Meds:.hydrALAZINE, labetalol, methocarbamol (ROBAXIN) IV, morphine injection, ondansetron (ZOFRAN) IV, oxyCODONE-acetaminophen  Antimicrobials: Anti-infectives (From admission, onward)    Start     Dose/Rate Route Frequency Ordered Stop   02/16/21 1500  vancomycin (VANCOREADY) IVPB 1500 mg/300 mL        1,500 mg 150 mL/hr  over 120 Minutes Intravenous Every 24 hours 02/16/21 0858     02/15/21 1500  vancomycin (VANCOREADY) IVPB 1250 mg/250 mL  Status:  Discontinued        1,250 mg 166.7 mL/hr over 90 Minutes Intravenous Every 24 hours 02/14/21 1700 02/16/21 0858   02/14/21 1800  metroNIDAZOLE (FLAGYL) IVPB 500 mg        500 mg 100 mL/hr over 60 Minutes Intravenous Every 12 hours 02/14/21 1649 02/21/21 1759   02/14/21 1700  cefTRIAXone (ROCEPHIN) 2 g in sodium chloride 0.9 % 100 mL IVPB        2 g 200 mL/hr over 30 Minutes Intravenous Every 24 hours 02/14/21 1649 02/21/21 1659   02/14/21 1345  vancomycin (VANCOREADY) IVPB 2000 mg/400 mL        2,000 mg 200 mL/hr over 120 Minutes Intravenous  Once 02/14/21 1331 02/14/21 1655   02/14/21 1330  vancomycin (VANCOCIN) IVPB 1000 mg/200 mL premix  Status:  Discontinued        1,000 mg 200 mL/hr over 60 Minutes Intravenous  Once 02/14/21 1327 02/14/21 1331        I have personally reviewed the following labs and images: CBC: Recent Labs  Lab 02/14/21 1317 02/15/21 0045 02/16/21 0435 02/19/21 0603  WBC 7.7 8.4 6.9 6.3  NEUTROABS 6.0  --   --   --   HGB 10.7* 9.6* 9.2* 8.9*  HCT 33.3* 29.2* 27.5* 27.0*  MCV 89.5 86.6 86.8 86.5  PLT 362 394 373 366   BMP &GFR Recent Labs  Lab 02/14/21 1317 02/15/21 0045 02/16/21 0435 02/19/21 0602 02/19/21 0603  NA 138 141 140 139  --   K 5.2* 3.6 3.9 3.9  --   CL 106 107 107 108  --   CO2 23 22 21* 22  --   GLUCOSE 122* 145* 118* 107*  --   BUN 26* _0 --   CREATININE 1.31* 1.19 1.09  1.10 1.07  --   CALCIUM 9.5 9.1 8.9 8.9  --   MG  --   --  2.3  --  2.3  PHOS  --   --  3.4 3.8  --    Estimated Creatinine Clearance: 80.1 mL/min (by C-G formula based on SCr of 1.07 mg/dL). Liver & Pancreas: Recent Labs  Lab 02/14/21 1317 02/16/21 0435 02/19/21 0602  AST 36  --   --   ALT 26  --   --   ALKPHOS 82  --   --   BILITOT 1.2  --   --   PROT 9.1*  --   --   ALBUMIN 4.0 3.1* 3.0*   No results for  input(s): LIPASE, AMYLASE in the last 168 hours. No results for input(s): AMMONIA in the last 168 hours. Diabetic: No results for input(s): HGBA1C in the last 72 hours.  Recent Labs  Lab 02/18/21 0631 02/18/21 1143 02/18/21 1628 02/18/21 2114 02/19/21 1117  GLUCAP 110* 99 103* 106* 126*   Cardiac Enzymes: No results for input(s): CKTOTAL, CKMB, CKMBINDEX, TROPONINI in the last 168 hours. No results for input(s): PROBNP in the last 8760 hours. Coagulation Profile: Recent Labs  Lab 02/14/21 1344  INR 1.1   Thyroid Function Tests: No results for input(s): TSH, T4TOTAL, FREET4, T3FREE, THYROIDAB in the last 72 hours. Lipid Profile: No results for input(s): CHOL, HDL, LDLCALC, TRIG, CHOLHDL, LDLDIRECT in the last 72 hours. Anemia Panel: No results for input(s): VITAMINB12, FOLATE, FERRITIN, TIBC, IRON, RETICCTPCT in the last 72 hours. Urine analysis:    Component Value Date/Time   COLORURINE STRAW (A) 07/05/2019 1557   APPEARANCEUR CLEAR 07/05/2019 1557   LABSPEC 1.017 07/05/2019 1557   PHURINE 5.0 07/05/2019 1557   GLUCOSEU >=500 (A) 07/05/2019 1557   HGBUR NEGATIVE 07/05/2019 1557   BILIRUBINUR NEGATIVE 07/05/2019 1557   KETONESUR 20 (A) 07/05/2019 1557   PROTEINUR NEGATIVE 07/05/2019 1557   NITRITE NEGATIVE 07/05/2019 1557   LEUKOCYTESUR NEGATIVE 07/05/2019 1557   Sepsis Labs: Invalid input(s): PROCALCITONIN, [place]  Microbiology: Recent Results (from the past 240 hour(s))  Culture, blood (routine x 2)     Status: None   Collection Time: 02/14/21  1:24 PM   Specimen: BLOOD  Result Value Ref Range Status   Specimen Description BLOOD RIGHT ANTECUBITAL  Final   Special Requests   Final    BOTTLES DRAWN AEROBIC AND ANAEROBIC Blood Culture adequate volume   Culture   Final    NO GROWTH 5 DAYS Performed at [facility], [address] [place], [place] [address]    Report Status 02/19/2021 FINAL  Final  Culture, blood (routine x 2)     Status: None    Collection Time: 02/14/21  1:44 PM   Specimen: BLOOD  Result Value Ref Range Status   Specimen Description  BLOOD BLOOD LEFT HAND  Final   Special Requests   Final    BOTTLES DRAWN AEROBIC AND ANAEROBIC Blood Culture adequate volume   Culture   Final    NO GROWTH 5 DAYS Performed at [facility], [address], [place], [place] [address]    Report Status 02/19/2021 FINAL  Final  Resp Panel by RT-PCR (Flu A&B, Covid) Nasopharyngeal Swab     Status: None   Collection Time: 02/14/21  3:37 PM   Specimen: Nasopharyngeal Swab; Nasopharyngeal(NP) swabs in vial transport medium  Result Value Ref Range Status   SARS Coronavirus 2 by RT PCR NEGATIVE NEGATIVE Final    Comment: (NOTE) SARS-CoV-2 target nucleic acids are NOT DETECTED.  The SARS-CoV-2 RNA is generally detectable in upper respiratory specimens during the acute phase of infection. The lowest concentration of SARS-CoV-2 viral copies this assay can detect is 138 copies/mL. A negative result does not preclude SARS-Cov-2 infection and should not be used as the sole basis for treatment or other patient management decisions. A negative result may occur with  improper specimen collection/handling, submission of specimen other than nasopharyngeal swab, presence of viral mutation(s) within the areas targeted by this assay, and inadequate number of viral copies(<138 copies/mL). A negative result must be combined with clinical observations, patient history, and epidemiological information. The expected result is Negative.  Fact Sheet for Patients:  EntrepreneurPulse.com.au  Fact Sheet for Healthcare Providers:  IncredibleEmployment.be  This test is no t yet approved or cleared by the [place] FDA and  has been authorized for detection and/or diagnosis of SARS-CoV-2 by FDA under an Emergency Use Authorization (EUA). This EUA will remain  in effect (meaning this test can be used) for the duration of  the COVID-19 declaration under Section 564(b)(1) of the Act, 21 U.S.C.section 360bbb-3(b)(1), unless the authorization is terminated  or revoked sooner.       Influenza A by PCR NEGATIVE NEGATIVE Final   Influenza B by PCR NEGATIVE NEGATIVE Final    Comment: (NOTE) The Xpert Xpress SARS-CoV-2/FLU/RSV plus assay is intended as an aid in the diagnosis of influenza from Nasopharyngeal swab specimens and should not be used as a sole basis for treatment. Nasal washings and aspirates are unacceptable for Xpert Xpress SARS-CoV-2/FLU/RSV testing.  Fact Sheet for Patients: EntrepreneurPulse.com.au  Fact Sheet for Healthcare Providers: IncredibleEmployment.be  This test is not yet approved or cleared by the [place] FDA and has been authorized for detection and/or diagnosis of SARS-CoV-2 by FDA under an Emergency Use Authorization (EUA). This EUA will remain in effect (meaning this test can be used) for the duration of the COVID-19 declaration under Section 564(b)(1) of the Act, 21 U.S.C. section 360bbb-3(b)(1), unless the authorization is terminated or revoked.  Performed at [facility], [address], [place], [place] [address]     Radiology Studies: DG Ang/Ext/Uni/Or Left  Result Date: 02/19/2021 CLINICAL DATA:  Popliteal endarterectomy EXAM: LEFT ANG/EXT/UNI/ OR CONTRAST:  Not provided FLUOROSCOPY TIME:  Fluoroscopy Time:  6 minutes 38 seconds Radiation Exposure Index (if provided by the fluoroscopic device): 84 mGy Number of Acquired Spot Images: 0 COMPARISON:  None. FINDINGS: Seven intraoperative cine clips were provided for interpretation. The submitted images demonstrate left lower extremity angiogram. There is high-grade focal stenosis of the distal popliteal artery which significantly following intervention. Single-vessel runoff to the ankle likely through peroneal artery. IMPRESSION: Intraoperative images of left lower extremity  angiogram as above. Electronically Signed   By: Miachel Roux M.D.   On:  02/19/2021 10:16      Caitlynn Ju T. [place]  If 7PM-7AM, please contact night-coverage [email or web address] 02/19/2021, 3:31 PM

## 2021-02-19 NOTE — Transfer of Care (Signed)
Immediate Anesthesia Transfer of Care Note  Patient: Adam Perez  Procedure(s) Performed: LEFT TRANSMETATARSAL AMPUTATION (Left: Foot) LEFT CALF VENOUS ULCER SHARP EXCISIONAL DEBRIDEMENT (Left: Leg Lower) APPLICATION OF SKIN SUBSTITUTE (Left: Leg Lower) POPITEAL ANGIOPLASTY (Left: Leg Lower)  Patient Location: PACU  Anesthesia Type:General  Level of Consciousness: awake and drowsy  Airway & Oxygen Therapy: Patient Spontanous Breathing and Patient connected to face mask oxygen  Post-op Assessment: Report given to RN and Post -op Vital signs reviewed and stable  Post vital signs: Reviewed and stable  Last Vitals:  Vitals Value Taken Time  BP 125/73 02/19/21 1115  Temp    Pulse 96 02/19/21 1115  Resp 18 02/19/21 1116  SpO2 97 % 02/19/21 1115  Vitals shown include unvalidated device data.  Last Pain:  Vitals:   02/19/21 0406  TempSrc: Oral  PainSc:       Patients Stated Pain Goal: 0 (02/15/21 1341)  Complications: No notable events documented.

## 2021-02-19 NOTE — Progress Notes (Signed)
Patient returned to 4E from PACU. Vitals obtained, A lined zeroed with good wave form, placed back on Tele, CCMD notified, assessment performed and right groin site level 0. Family at bedside and patient has call bell within reach.   Adam Perez

## 2021-02-19 NOTE — Anesthesia Procedure Notes (Signed)
Procedure Name: Intubation Date/Time: 02/19/2021 8:01 AM Performed by: Nils Pyle, CRNA Pre-anesthesia Checklist: Patient identified, Emergency Drugs available, Suction available and Patient being monitored Patient Re-evaluated:Patient Re-evaluated prior to induction Oxygen Delivery Method: Circle System Utilized Preoxygenation: Pre-oxygenation with 100% oxygen Induction Type: IV induction Ventilation: Mask ventilation without difficulty and Oral airway inserted - appropriate to patient size Laryngoscope Size: Hyacinth Meeker and 2 Grade View: Grade III Tube type: Oral Tube size: 7.5 mm Number of attempts: 1 Airway Equipment and Method: Stylet and Oral airway Placement Confirmation: ETT inserted through vocal cords under direct vision, positive ETCO2 and breath sounds checked- equal and bilateral Secured at: 23 cm Tube secured with: Tape Dental Injury: Teeth and Oropharynx as per pre-operative assessment

## 2021-02-19 NOTE — Progress Notes (Signed)
PT Cancellation Note  Patient Details Name: QUINTIN HJORT MRN: [id number] DOB: 1955-09-02   Cancelled Treatment:    Reason Eval/Treat Not Completed: Patient at procedure or test/unavailable. Will follow-up for PT treatment as schedule permits.  Ina Homes, PT, DPT Acute Rehabilitation Services  Pager [phone] Office [phone]  Malachy Chamber 02/19/2021, 7:35 AM

## 2021-02-20 ENCOUNTER — Encounter (HOSPITAL_COMMUNITY): Payer: Self-pay | Admitting: Vascular Surgery

## 2021-02-20 DIAGNOSIS — R55 Syncope and collapse: Secondary | ICD-10-CM

## 2021-02-20 DIAGNOSIS — Z9889 Other specified postprocedural states: Secondary | ICD-10-CM

## 2021-02-20 DIAGNOSIS — D62 Acute posthemorrhagic anemia: Secondary | ICD-10-CM

## 2021-02-20 LAB — RENAL FUNCTION PANEL
Albumin: 2.4 g/dL — ABNORMAL LOW (ref 3.5–5.0)
Anion gap: 6 (ref 5–15)
BUN: 17 mg/dL (ref 8–23)
CO2: 23 mmol/L (ref 22–32)
Calcium: 8.5 mg/dL — ABNORMAL LOW (ref 8.9–10.3)
Chloride: 106 mmol/L (ref 98–111)
Creatinine, Ser: 1.11 mg/dL (ref 0.61–1.24)
GFR, Estimated: >60 mL/min (ref >60)
Glucose, Bld: 105 mg/dL — ABNORMAL HIGH (ref 70–99)
Phosphorus: 3.4 mg/dL (ref 2.5–4.6)
Potassium: 3.9 mmol/L (ref 3.5–5.1)
Sodium: 135 mmol/L (ref 135–145)

## 2021-02-20 LAB — CBC
HCT: 21.2 % — ABNORMAL LOW (ref 39.0–52.0)
HCT: 25.1 % — ABNORMAL LOW (ref 39.0–52.0)
Hemoglobin: 7.1 g/dL — ABNORMAL LOW (ref 13.0–17.0)
Hemoglobin: 8.2 g/dL — ABNORMAL LOW (ref 13.0–17.0)
MCH: 29 pg (ref 26.0–34.0)
MCH: 28.9 pg (ref 26.0–34.0)
MCHC: 33.5 g/dL (ref 30.0–36.0)
MCHC: 32.7 g/dL (ref 30.0–36.0)
MCV: 86.5 fL (ref 80.0–100.0)
MCV: 88.4 fL (ref 80.0–100.0)
Platelets: 313 10*3/uL (ref 150–400)
Platelets: 350 10*3/uL (ref 150–400)
RBC: 2.45 MIL/uL — ABNORMAL LOW (ref 4.22–5.81)
RBC: 2.84 MIL/uL — ABNORMAL LOW (ref 4.22–5.81)
RDW: 15.7 % — ABNORMAL HIGH (ref 11.5–15.5)
RDW: 15.7 % — ABNORMAL HIGH (ref 11.5–15.5)
WBC: 9.3 10*3/uL (ref 4.0–10.5)
WBC: 10.5 10*3/uL (ref 4.0–10.5)
nRBC: 0 % (ref 0.0–0.2)
nRBC: 0 % (ref 0.0–0.2)

## 2021-02-20 LAB — PREPARE RBC (CROSSMATCH)

## 2021-02-20 LAB — RETICULOCYTES
Immature Retic Fract: 16.9 % — ABNORMAL HIGH (ref 2.3–15.9)
RBC.: 2.4 MIL/uL — ABNORMAL LOW (ref 4.22–5.81)
Retic Count, Absolute: 42 10*3/uL (ref 19.0–186.0)
Retic Ct Pct: 1.7 % (ref 0.4–3.1)

## 2021-02-20 LAB — GLUCOSE, CAPILLARY
Glucose-Capillary: 111 mg/dL — ABNORMAL HIGH (ref 70–99)
Glucose-Capillary: 119 mg/dL — ABNORMAL HIGH (ref 70–99)
Glucose-Capillary: 142 mg/dL — ABNORMAL HIGH (ref 70–99)
Glucose-Capillary: 130 mg/dL — ABNORMAL HIGH (ref 70–99)
Glucose-Capillary: 143 mg/dL — ABNORMAL HIGH (ref 70–99)
Glucose-Capillary: 135 mg/dL — ABNORMAL HIGH (ref 70–99)

## 2021-02-20 LAB — MAGNESIUM: Magnesium: 2.2 mg/dL (ref 1.7–2.4)

## 2021-02-20 LAB — IRON AND TIBC
Iron: 27 ug/dL — ABNORMAL LOW (ref 45–182)
Saturation Ratios: 14 % — ABNORMAL LOW (ref 17.9–39.5)
TIBC: 199 ug/dL — ABNORMAL LOW (ref 250–450)
UIBC: 172 ug/dL

## 2021-02-20 LAB — FERRITIN: Ferritin: 319 ng/mL (ref 24–336)

## 2021-02-20 LAB — FOLATE: Folate: 11.5 ng/mL (ref >5.9)

## 2021-02-20 LAB — HEMOGLOBIN AND HEMATOCRIT, BLOOD
HCT: 26.1 % — ABNORMAL LOW (ref 39.0–52.0)
Hemoglobin: 8.6 g/dL — ABNORMAL LOW (ref 13.0–17.0)

## 2021-02-20 LAB — VITAMIN B12: Vitamin B-12: 348 pg/mL (ref 180–914)

## 2021-02-20 MED ORDER — POLYETHYLENE GLYCOL 3350 17 G PO PACK: 17.0000 g | PACK | Freq: Two times a day (BID) | ORAL | Status: DC | PRN

## 2021-02-20 MED ORDER — SENNOSIDES-DOCUSATE SODIUM 8.6-50 MG PO TABS
1.0000 | ORAL_TABLET | Freq: Two times a day (BID) | ORAL | Status: DC | PRN
  Administered 2021-02-21 – 2021-02-22 (×3): 1 via ORAL
  Filled 2021-02-20 (×3): qty 1

## 2021-02-20 MED ORDER — SODIUM CHLORIDE 0.9% IV SOLUTION: Freq: Once | INTRAVENOUS | Status: AC

## 2021-02-20 NOTE — Progress Notes (Signed)
Inpatient Rehab Admissions Coordinator:  ? ?Per therapy recommendations,  patient was screened for CIR candidacy by Ama Mcmaster, MS, CCC-SLP. At this time, Pt. Appears to be a a potential candidate for CIR. I will place   order for rehab consult per protocol for full assessment. Please contact me any with questions. ? ?Adam Cowgill, MS, CCC-SLP ?Rehab Admissions Coordinator  ?[phone] (celll) ?[phone] (office) ? ?

## 2021-02-20 NOTE — Progress Notes (Signed)
Physical Therapy Re-evaluation Patient Details Name: Adam Perez MRN: [id number] DOB: [birth year] Today's Date: 02/20/2021   History of Present Illness Pt is a 65 y.o. male admitted 02/14/21 with L hip pain and osteomyelitis and ischemic ulceration of L foot. XR negative for bony changes. S/p LLE angiogram 12/8. S/p L popliteal angioplasty, calf debridement, and L transmetatarsal amputation 12/12. PMH includes: CVA with reisudal left side weakness, HTN, DM type II.    PT Comments    Pt seen for re-evaluation now s/p L transmetatarsal amputation. Pt limited by severe L foot pain ("it feels like there's a plastic bar under my foot"), generalized weakness and impaired balance. Pt having significant difficulties standing from [facility] despite max A.  During transfer attempts, pt with apparent syncopal/vasovagal episode requiring rapid response call, therefore further mobility deferred. Unable to discuss d/c recs this session, but feel pt would likely benefit from intensive CIR-level therapies to maximize fucntional mobility and independence prior to return home; PTA, pt living alone and independent. Will continue to follow acutely to address established goals.    Recommendations for follow up therapy are one component of a multi-disciplinary discharge planning process, led by the attending physician.  Recommendations may be updated based on patient status, additional functional criteria and insurance authorization.  Follow Up Recommendations  Acute inpatient rehab (3hours/day) (TBD - pending progression)     Assistance Recommended at Discharge Frequent or constant Supervision/Assistance  Equipment Recommendations  Rolling walker (2 wheels) (TBD - pending progression)    Recommendations for Other Services       Precautions / Restrictions Precautions Precautions: Fall Required Braces or Orthoses: Other Brace Other Brace: darco shoe, unable to apply today due to bandaging Restrictions Weight  Bearing Restrictions: Yes LLE Weight Bearing: Partial weight bearing LLE Partial Weight Bearing Percentage or Pounds: thorugh heel only     Mobility  Bed Mobility               General bed mobility comments: received on BSC    Transfers Overall transfer level: Needs assistance Equipment used: Rolling walker (2 wheels) Transfers: Sit to/from Stand Sit to Stand: Max assist;+2 safety/equipment           General transfer comment: Failed attempt at standing from [facility] with max A. Total A +3 required to return to supine given apparent syncopal episode.    Ambulation/Gait                   Stairs             Wheelchair Mobility    Modified Rankin (Stroke Patients Only)       Balance Overall balance assessment: Needs assistance Sitting-balance support: Feet supported;No upper extremity supported Sitting balance-Leahy Scale: Fair         Standing balance comment: unable to stand                            Cognition Arousal/Alertness: Awake/alert Behavior During Therapy: [facility] for tasks assessed/performed;Anxious Overall Cognitive Status: No family/caregiver present to determine baseline cognitive functioning Area of Impairment: Safety/judgement;Problem solving;Attention;Awareness                   Current Attention Level: Sustained     Safety/Judgement: Decreased awareness of safety Awareness: Emergent Problem Solving: Slow processing General Comments: Pt having dificulties seeing the need for a RW to transfer to the chair.        Exercises  General Comments General comments (skin integrity, edema, etc.): Pt became unresponsive on BSC requring a rapid response call. Pt eyes were open and he was moaning and swaying.      Pertinent Vitals/Pain Pain Assessment: Faces Faces Pain Scale: Hurts whole lot Pain Location: L foot Pain Descriptors / Indicators: Sore;Sharp;Constant Pain Intervention(s): Premedicated before  session;Monitored during session;Limited activity within patient's tolerance    Home Living                          Prior Function            PT Goals (current goals can now be found in the care plan section) Acute Rehab PT Goals Patient Stated Goal: to return home PT Goal Formulation: With patient Time For Goal Achievement: 03/02/21 Potential to Achieve Goals: Fair Progress towards PT goals: Not progressing toward goals - comment (session limited by pt becoming unresponsive requiring rapid response)    Frequency    Min 3X/week      PT Plan Discharge plan needs to be updated    Co-evaluation              AM-PAC PT "6 Clicks" Mobility   Outcome Measure  Help needed turning from your back to your side while in a flat bed without using bedrails?: None Help needed moving from lying on your back to sitting on the side of a flat bed without using bedrails?: A Little Help needed moving to and from a bed to a chair (including a wheelchair)?: Total Help needed standing up from a chair using your arms (e.g., wheelchair or bedside chair)?: Total Help needed to walk in hospital room?: Total Help needed climbing 3-5 steps with a railing? : Total 6 Click Score: 11    End of Session Equipment Utilized During Treatment: Gait belt Activity Tolerance: Treatment limited secondary to medical complications (Comment);Patient limited by pain Patient left: with nursing/sitter in room;in bed Nurse Communication: Mobility status PT Visit Diagnosis: Other abnormalities of gait and mobility (R26.89);Muscle weakness (generalized) (M62.81);Difficulty in walking, not elsewhere classified (R26.2);Pain;Unsteadiness on feet (R26.81) Pain - Right/Left: Left Pain - part of body: Ankle and joints of foot;Hip     Time: 7408-1448 PT Time Calculation (min) (ACUTE ONLY): 17 min  Charges:    $Re-evaluation                    Daryel November, SPT    Daryel November 02/20/2021, 12:10  PM

## 2021-02-20 NOTE — Care Management Important Message (Signed)
Important Message  Patient Details  Name: Adam Perez MRN: [id number] Date of Birth: 1956-02-19   Medicare Important Message Given:  Yes     Renie Ora 02/20/2021, 11:06 AM

## 2021-02-20 NOTE — Progress Notes (Signed)
Pharmacy Antibiotic Note  Adam Perez is a 65 y.o. male admitted on 02/14/2021 with  wound infections .  Pharmacy was consulted on 02/14/21 for vancomycin dosing.  Also on Flagyl and ceftriaxone.  Vancomycin started 12/7 and dose was adjusted on 12/9 to 1500 mg IV q24h, estimated AUC 489 based on Scr 1.09.  Renal function remains stable.   POD #1 s/p OR 12/12. for revascularization of L left leg ulcer and TMA.  Last dose of vancomycin is due  today 02/20/21 as Vancomycin pharmacy consult order specified 7 days for duration of therapy.  No vancomycin levels needed.   Microbiology results: 12/7 BCx: neg    Plan: Continue vancomycin 1500 mg q 24 hrs for last dose due today at 15:00 , to complete 7 days of therapy as MD specified on initial pharmacy consult order 12/7.   Height: 5\' 7"  (170.2 cm) Weight: 109.3 kg (240 lb 15.4 oz) IBW/kg (Calculated) : 66.1  Temp (24hrs), Avg:98.1 F (36.7 C), Min:97 F (36.1 C), Max:99.8 F (37.7 C)  Recent Labs  Lab 02/14/21 1317 02/14/21 1510 02/15/21 0045 02/16/21 0435 02/19/21 0602 02/19/21 0603 02/19/21 0715 02/20/21 0430  WBC 7.7  --  8.4 6.9  --  6.3  --  9.3  CREATININE 1.31*  --  1.19 1.09   1.10 1.07  --  1.00 1.11  LATICACIDVEN 2.5* 2.4*  --   --   --   --   --   --      Estimated Creatinine Clearance: 77.2 mL/min (by C-G formula based on SCr of 1.11 mg/dL).    Not on File  Antimicrobials this admission: Vanco 12/7 >>(12/13) CTX 12/7 >> (12/13) Flagyl 12/7 >> (12/14)   Microbiology results: 12/7 BCx: neg   Thank you for allowing pharmacy to be a part of this patients care. 14/7, RPh Clinical Pharmacist [phone]  02/20/2021 9:45 AM  After 3:15 PM refer to [email or web address] for  "[facility] pharmacy" phone numbers listing

## 2021-02-20 NOTE — Progress Notes (Addendum)
  Progress Note    02/20/2021 7:33 AM 1 Day Post-Op  Subjective:  Pain is tolerable   Vitals:   02/19/21 2313 02/20/21 0723  BP: 134/77 130/78  Pulse: 96 96  Resp: 20 18  Temp: 99.8 F (37.7 C) 98.3 F (36.8 C)  SpO2: 100% 96%   Physical Exam: Lungs:  non labored Incisions:  dressing left in place; R groin cath site without hematoma Extremities:  soft doppler signal Neurologic: A&O  CBC    Component Value Date/Time   WBC 9.3 02/20/2021 0430   RBC 2.45 (L) 02/20/2021 0430   RBC 2.40 (L) 02/20/2021 0430   HGB 7.1 (L) 02/20/2021 0430   HCT 21.2 (L) 02/20/2021 0430   PLT 313 02/20/2021 0430   MCV 86.5 02/20/2021 0430   MCH 29.0 02/20/2021 0430   MCHC 33.5 02/20/2021 0430   RDW 15.7 (H) 02/20/2021 0430   LYMPHSABS 1.2 02/14/2021 1317   MONOABS 0.5 02/14/2021 1317   EOSABS 0.0 02/14/2021 1317   BASOSABS 0.1 02/14/2021 1317    BMET    Component Value Date/Time   NA 135 02/20/2021 0430   K 3.9 02/20/2021 0430   CL 106 02/20/2021 0430   CO2 23 02/20/2021 0430   GLUCOSE 105 (H) 02/20/2021 0430   BUN 17 02/20/2021 0430   CREATININE 1.11 02/20/2021 0430   CALCIUM 8.5 (L) 02/20/2021 0430   GFRNONAA >60 02/20/2021 0430   GFRAA >60 07/06/2019 0407   GFRAA >60 07/06/2019 0407    INR    Component Value Date/Time   INR 1.1 02/14/2021 1344     Intake/Output Summary (Last 24 hours) at 02/20/2021 0733 Last data filed at 02/20/2021 0440 Gross per 24 hour  Intake 2240.65 ml  Output 4025 ml  Net -1784.35 ml     Assessment/Plan:  65 y.o. male is s/p L pop angioplasty, TMA, wound debridement with skin substitute 1 Day Post-Op   Soft dopplerable flow L peroneal Dressing change tomorrow OOB; heelweightbearing only LLE; also encouraged patient to straighten L knee Continue vac to L leg ulcer   Emilie Rutter, PA-C [facility] [phone] 02/20/2021 7:33 AM  VASCULAR STAFF ADDENDUM: I have independently interviewed and examined the  patient. I agree with the above.  Patient doing well after left popliteal angioplasty, transmetatarsal amputation, venous ulcer debridement, application of skin substitute. Mobilize as able with PT/OT. Safe for discharge once pain is well controlled and patient able to mobilize. Follow-up with me in weeks with ABI for wound check.  [name]. [name], MD [facility] Phone Number: [phone] 02/20/2021 9:00 AM

## 2021-02-20 NOTE — Progress Notes (Signed)
OT Cancellation Note  Patient Details Name: Adam Perez MRN: [id number] DOB: 1955-11-13   Cancelled Treatment:    Reason Eval/Treat Not Completed: Medical issues which prohibited therapy. Pt had syncopal/vasovagal episode during earlier PT session and required rapid response call. OT will hold an follow up as appropriate  Galen Manila 02/20/2021, 12:30 PM

## 2021-02-20 NOTE — Progress Notes (Signed)
PROGRESS NOTE  PHU RECORD ERX:[id number] DOB: 01-11-1956   PCP: [facility]  Patient is from: Home.  Lives alone.  Uses cane at baseline.  Gets his care at the [place].  DOA: 02/14/2021 LOS: 6  Chief complaints:  Chief Complaint  Patient presents with   Knee Pain     Brief Narrative / Interim history: 65 year old M with PMH of CVA with left-sided spastic hemiparesis, IDDM-2, PVD, PTSD, GAD and neurocognitive deficits presenting with worsening left hip pain, malodorous wound in left foot and left lower extremity, and found to have infected left foot and leg diabetic wounds with cellulitis and left second and third toe osteomyelitis as noted on MRI.  Patient was started on IV vancomycin, ceftriaxone and Flagyl.  Vascular surgery and orthopedic surgery consulted.   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  Underwent LLE angiography, left popliteal angioplasty, left TMT and excisional debridement of left calf venous ulcer with a skin substitute to left calf wound on 02/19/2021.  Subjective: Seen and examined earlier this morning.  Sitting on bedside chair.  No major events overnight.  No complaints.  He rates his pain 3/10.  Patient became unresponsive and lightheaded after he got up to bedside commode with PT later this morning.  Hemodynamically stable.  He recovered quickly.  No report of chest pain or dyspnea.  Twelve-lead EKG without acute ischemic finding other than nonspecific T wave changes in lateral leads.  No report of bowel movement or bleeding.  Objective: Vitals:   02/19/21 2000 02/19/21 2313 02/20/21 0723 02/20/21 0945  BP:  134/77 130/78 125/85  Pulse: (!) 103 96 96   Resp:  20 18   Temp: 98 F (36.7 C) 99.8 F (37.7 C) 98.3 F (36.8 C)   TempSrc: Oral Axillary Oral   SpO2: 100% 100% 96%   Weight:      Height:        Intake/Output Summary (Last 24  hours) at 02/20/2021 1035 Last data filed at 02/20/2021 0908 Gross per 24 hour  Intake 1240.65 ml  Output 3375 ml  Net -2134.35 ml   Filed Weights   02/14/21 2150 02/14/21 2249 02/15/21 1439  Weight: 108.6 kg 109.1 kg 109.3 kg    Examination:  GENERAL: No apparent distress.  Nontoxic. HEENT: MMM.  Vision and hearing grossly intact.  NECK: Supple.  No apparent JVD.  RESP: 96% on RA.  No IWOB.  Fair aeration bilaterally. CVS:  RRR. Heart sounds normal.  ABD/GI/GU: BS+. Abd soft, NTND.  MSK/EXT:  Moves extremities.  BLE edema. SKIN: Dressing/Ace wrap over LLE DCI.  Wound VAC over LLE.  About 50 cc serosanguineous fluid in VAC canister. NEURO: Awake and alert. Oriented appropriately.  No apparent focal neuro deficit. PSYCH: Calm. Normal affect.   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  02/19/2021-Underwent LLE angiography, left popliteal angioplasty, left TMT and excisional debridement of left calf venous ulcer with a skin substitute to left calf wound  Microbiology summarized: COVID-19 and influenza PCR nonreactive. Blood cultures NGTD.  Assessment & Plan: Diabetic infected left foot and left leg infection/cellulitis/myositis Left second and third toe osteomyelitis Left lower extremity PAD -MRI suggestive of cellulitis, myositis and osteomyelitis. -S/ p LLE angiography, left popliteal angioplasty, left TMT and excisional debridement of left calf venous ulcer with a skin substitute to left  calf wound on 02/19/2021 -CRP 1.4.  ESR 76. -Continue Vanco, CTX and Flagyl -Resume Plavix once H&H stable after transfusion. -On Robaxin, Norco and IV morphine for pain control -Added MiraLAX and Senokot-S  Acute surgical blood loss anemia: About 250 cc EBL noted in surgical note.  Patient denies melena or hematochezia.  Has not had a bowel movement yet.  Anemia panel  consistent with anemia of chronic disease Recent Labs    02/14/21 1317 02/15/21 0045 02/16/21 0435 02/19/21 0603 02/19/21 0715 02/20/21 0430  HGB 10.7* 9.6* 9.2* 8.9* 9.2* 7.1*  -Transfuse 1 unit-verbal consent obtained -Monitor for bleeding.  Presyncope-likely vasovagal from straining while on commode.  No fall.  Reportedly, he was straining.  Hemodynamically stable.  No report of bleeding.  CBG within normal.  EKG reassuring.  No chest pain or dyspnea.  No events on telemetry. -Bowel regimen for constipation -Continue telemetry -Orthostatic vitals -Fall precaution  Left hip pain/osteoarthritis: MRI suggest arthritis. -Pain control -PT/OT    Controlled IDDM-2 with diabetic foot infection/PAD/hyperglycemia: A1c 6.1%. Recent Labs  Lab 02/18/21 2114 02/19/21 1117 02/19/21 2116 02/20/21 0614 02/20/21 0928  GLUCAP 106* 126* 143* 111* 119*  -Continue current insulin regimen -Continue home statin.   H/o Prior CVA with residual left-sided spastic hemiparesis -Continue aspirin and statin. -Hold home baclofen while on Robaxin -Resume Plavix once H&H stable   Essential hypertension: Normotensive. -Continue home meds.   PTSD/anxiety/depression/neurocognitive deficit: Stable. -continue with home meds   Morbid obesity with complication including diabetes, PAD... Body mass index is 37.74 kg/m. Nutrition Problem: Increased nutrient needs Etiology: wound healing (LLE wound) Signs/Symptoms: estimated needs Interventions: MVI, Prostat   DVT prophylaxis:  SCD's Start: 02/15/21 1523   On subcu Lovenox.  Code Status: Full code Family Communication: Updated patient's wife at bedside on 12/12. Level of care: Med-Surg Status is: Inpatient  Remains inpatient appropriate because: IV antibiotics for infected venous ulcer, osteomyelitis and cellulitis  Final disposition: Likely home once cleared by vascular surgery.  Consultants:  Orthopedic surgery Vascular surgery   Sch  Meds:  Scheduled Meds:  (feeding supplement) PROSource Plus  30 mL Oral BID WC   sodium chloride   Intravenous Once   sodium chloride   Intravenous Once   amLODipine  10 mg Oral Daily   aspirin EC  81 mg Oral Q breakfast   atorvastatin  40 mg Oral Daily   Chlorhexidine Gluconate Cloth  6 each Topical Q0600   enoxaparin (LOVENOX) injection  55 mg Subcutaneous QHS   insulin aspart  0-15 Units Subcutaneous TID WC   insulin aspart  0-5 Units Subcutaneous QHS   insulin glargine-yfgn  10 Units Subcutaneous QHS   lurasidone  20 mg Oral Daily   multivitamin with minerals  1 tablet Oral Daily   sertraline  150 mg Oral Daily   traZODone  100 mg Oral QHS   Continuous Infusions:  cefTRIAXone (ROCEPHIN)  IV Stopped (02/19/21 1815)   methocarbamol (ROBAXIN) IV Stopped (02/14/21 2125)   metronidazole 500 mg (02/20/21 0434)   vancomycin Stopped (02/19/21 1607)   PRN Meds:.hydrALAZINE, labetalol, methocarbamol (ROBAXIN) IV, morphine injection, ondansetron (ZOFRAN) IV, oxyCODONE-acetaminophen, polyethylene glycol, senna-docusate  Antimicrobials: Anti-infectives (From admission, onward)    Start     Dose/Rate Route Frequency Ordered Stop   02/16/21 1500  vancomycin (VANCOREADY) IVPB 1500 mg/300 mL        1,500 mg 150 mL/hr over 120 Minutes Intravenous Every 24 hours 02/16/21 0858 02/21/21 1459   02/15/21 1500  vancomycin (VANCOREADY) IVPB  1250 mg/250 mL  Status:  Discontinued        1,250 mg 166.7 mL/hr over 90 Minutes Intravenous Every 24 hours 02/14/21 1700 02/16/21 0858   02/14/21 1800  metroNIDAZOLE (FLAGYL) IVPB 500 mg        500 mg 100 mL/hr over 60 Minutes Intravenous Every 12 hours 02/14/21 1649 02/21/21 1759   02/14/21 1700  cefTRIAXone (ROCEPHIN) 2 g in sodium chloride 0.9 % 100 mL IVPB        2 g 200 mL/hr over 30 Minutes Intravenous Every 24 hours 02/14/21 1649 02/21/21 1659   02/14/21 1345  vancomycin (VANCOREADY) IVPB 2000 mg/400 mL        2,000 mg 200 mL/hr over 120 Minutes  Intravenous  Once 02/14/21 1331 02/14/21 1655   02/14/21 1330  vancomycin (VANCOCIN) IVPB 1000 mg/200 mL premix  Status:  Discontinued        1,000 mg 200 mL/hr over 60 Minutes Intravenous  Once 02/14/21 1327 02/14/21 1331        I have personally reviewed the following labs and images: CBC: Recent Labs  Lab 02/14/21 1317 02/15/21 0045 02/16/21 0435 02/19/21 0603 02/19/21 0715 02/20/21 0430  WBC 7.7 8.4 6.9 6.3  --  9.3  NEUTROABS 6.0  --   --   --   --   --   HGB 10.7* 9.6* 9.2* 8.9* 9.2* 7.1*  HCT 33.3* 29.2* 27.5* 27.0* 27.0* 21.2*  MCV 89.5 86.6 86.8 86.5  --  86.5  PLT 362 394 373 366  --  313   BMP &GFR Recent Labs  Lab 02/14/21 1317 02/15/21 0045 02/16/21 0435 02/19/21 0602 02/19/21 0603 02/19/21 0715 02/20/21 0430  NA 138 141 140 139  --  142 135  K 5.2* 3.6 3.9 3.9  --  3.8 3.9  CL 106 107 107 108  --  108 106  CO2 23 22 21* 22  --   --  23  GLUCOSE 122* 145* 118* 107*  --  117* 105*  BUN 26* _0 --  21 17  CREATININE 1.31* 1.19 1.09   1.10 1.07  --  1.00 1.11  CALCIUM 9.5 9.1 8.9 8.9  --   --  8.5*  MG  --   --  2.3  --  2.3  --  2.2  PHOS  --   --  3.4 3.8  --   --  3.4   Estimated Creatinine Clearance: 77.2 mL/min (by C-G formula based on SCr of 1.11 mg/dL). Liver & Pancreas: Recent Labs  Lab 02/14/21 1317 02/16/21 0435 02/19/21 0602 02/20/21 0430  AST 36  --   --   --   ALT 26  --   --   --   ALKPHOS 82  --   --   --   BILITOT 1.2  --   --   --   PROT 9.1*  --   --   --   ALBUMIN 4.0 3.1* 3.0* 2.4*   No results for input(s): LIPASE, AMYLASE in the last 168 hours. No results for input(s): AMMONIA in the last 168 hours. Diabetic: No results for input(s): HGBA1C in the last 72 hours.  Recent Labs  Lab 02/18/21 2114 02/19/21 1117 02/19/21 2116 02/20/21 0614 02/20/21 0928  GLUCAP 106* 126* 143* 111* 119*   Cardiac Enzymes: No results for input(s): CKTOTAL, CKMB, CKMBINDEX, TROPONINI in the last 168 hours. No results for  input(s): PROBNP in the last 8760 hours. Coagulation  Profile: Recent Labs  Lab 02/14/21 1344  INR 1.1   Thyroid Function Tests: No results for input(s): TSH, T4TOTAL, FREET4, T3FREE, THYROIDAB in the last 72 hours. Lipid Profile: No results for input(s): CHOL, HDL, LDLCALC, TRIG, CHOLHDL, LDLDIRECT in the last 72 hours. Anemia Panel: Recent Labs    02/20/21 0430  VITAMINB12 348  FOLATE 11.5  FERRITIN 319  TIBC 199*  IRON 27*  RETICCTPCT 1.7   Urine analysis:    Component Value Date/Time   COLORURINE STRAW (A) 07/05/2019 1557   APPEARANCEUR CLEAR 07/05/2019 1557   LABSPEC 1.017 07/05/2019 1557   PHURINE 5.0 07/05/2019 1557   GLUCOSEU >=500 (A) 07/05/2019 1557   HGBUR NEGATIVE 07/05/2019 1557   BILIRUBINUR NEGATIVE 07/05/2019 1557   KETONESUR 20 (A) 07/05/2019 1557   PROTEINUR NEGATIVE 07/05/2019 1557   NITRITE NEGATIVE 07/05/2019 1557   LEUKOCYTESUR NEGATIVE 07/05/2019 1557   Sepsis Labs: Invalid input(s): PROCALCITONIN, [place]  Microbiology: Recent Results (from the past 240 hour(s))  Culture, blood (routine x 2)     Status: None   Collection Time: 02/14/21  1:24 PM   Specimen: BLOOD  Result Value Ref Range Status   Specimen Description BLOOD RIGHT ANTECUBITAL  Final   Special Requests   Final    BOTTLES DRAWN AEROBIC AND ANAEROBIC Blood Culture adequate volume   Culture   Final    NO GROWTH 5 DAYS Performed at [facility], [address], [place], [place] [address]    Report Status 02/19/2021 FINAL  Final  Culture, blood (routine x 2)     Status: None   Collection Time: 02/14/21  1:44 PM   Specimen: BLOOD  Result Value Ref Range Status   Specimen Description BLOOD BLOOD LEFT HAND  Final   Special Requests   Final    BOTTLES DRAWN AEROBIC AND ANAEROBIC Blood Culture adequate volume   Culture   Final    NO GROWTH 5 DAYS Performed at [facility], [address], [place], [place] [address]    Report Status 02/19/2021 FINAL  Final  Resp Panel by  RT-PCR (Flu A&B, Covid) Nasopharyngeal Swab     Status: None   Collection Time: 02/14/21  3:37 PM   Specimen: Nasopharyngeal Swab; Nasopharyngeal(NP) swabs in vial transport medium  Result Value Ref Range Status   SARS Coronavirus 2 by RT PCR NEGATIVE NEGATIVE Final    Comment: (NOTE) SARS-CoV-2 target nucleic acids are NOT DETECTED.  The SARS-CoV-2 RNA is generally detectable in upper respiratory specimens during the acute phase of infection. The lowest concentration of SARS-CoV-2 viral copies this assay can detect is 138 copies/mL. A negative result does not preclude SARS-Cov-2 infection and should not be used as the sole basis for treatment or other patient management decisions. A negative result may occur with  improper specimen collection/handling, submission of specimen other than nasopharyngeal swab, presence of viral mutation(s) within the areas targeted by this assay, and inadequate number of viral copies(<138 copies/mL). A negative result must be combined with clinical observations, patient history, and epidemiological information. The expected result is Negative.  Fact Sheet for Patients:  EntrepreneurPulse.com.au  Fact Sheet for Healthcare Providers:  IncredibleEmployment.be  This test is no t yet approved or cleared by the [place] FDA and  has been authorized for detection and/or diagnosis of SARS-CoV-2 by FDA under an Emergency Use Authorization (EUA). This EUA will remain  in effect (meaning this test can be used) for the duration of the COVID-19 declaration under Section 564(b)(1) of the  Act, 21 U.S.C.section 360bbb-3(b)(1), unless the authorization is terminated  or revoked sooner.       Influenza A by PCR NEGATIVE NEGATIVE Final   Influenza B by PCR NEGATIVE NEGATIVE Final    Comment: (NOTE) The Xpert Xpress SARS-CoV-2/FLU/RSV plus assay is intended as an aid in the diagnosis of influenza from Nasopharyngeal swab  specimens and should not be used as a sole basis for treatment. Nasal washings and aspirates are unacceptable for Xpert Xpress SARS-CoV-2/FLU/RSV testing.  Fact Sheet for Patients: EntrepreneurPulse.com.au  Fact Sheet for Healthcare Providers: IncredibleEmployment.be  This test is not yet approved or cleared by the [place] FDA and has been authorized for detection and/or diagnosis of SARS-CoV-2 by FDA under an Emergency Use Authorization (EUA). This EUA will remain in effect (meaning this test can be used) for the duration of the COVID-19 declaration under Section 564(b)(1) of the Act, 21 U.S.C. section 360bbb-3(b)(1), unless the authorization is terminated or revoked.  Performed at [facility], [address], [place], [place] [address]     Radiology Studies: No results found.    Donneisha Beane T. [place]  If 7PM-7AM, please contact night-coverage [email or web address] 02/20/2021, 10:35 AM

## 2021-02-20 NOTE — Progress Notes (Signed)
Patient on [facility] attempting to have a bowel movement. PT went in to work with patient. PT called out for emergency assistance because patient experienced a syncopal episode/ vagal response with straining on the BSC. Never lost pulse. Patient extremely wobbly and unresponsive. Able to get patient to bed and obtained vital signs and blood sugar. BP: 116/76, Glucose: 142, O2: 100%, and HR in 70's. Patient regained alertness and was able to talk to provider to let them know what happened. RN called primary MD, Alanda Slim. Orders for EKG and labs placed. Completed and patient alert and oriented. Will continue to monitor. Call bell within reach. [place] N Lige Lakeman

## 2021-02-20 NOTE — Progress Notes (Signed)
Discontinued A-line/Foley and was very painful moving from Bed to chair. MD at bedside and aware of pt. Status. Informed pt. To urinate by noon with pt. Verbalizing understanding

## 2021-02-20 NOTE — Progress Notes (Signed)
Called to bedside with Code Blue alert.  Per staff patient had been up to the [facility].  While he was straining he became less responsive.  Staff assisted patient back to bed and called a code blue.  No CPR performed.  Upon our arrival patient is lying in the bed he is alert and oriented.  BP 116/76  HR 95. Patient endorses some pain in his foot and some dizziness when he stands up.  He has no memory of getting back to bed.    Dr Alanda Slim notified of event. See MD orders RN to call if assistance needed

## 2021-02-21 DIAGNOSIS — E860 Dehydration: Secondary | ICD-10-CM

## 2021-02-21 LAB — GLUCOSE, CAPILLARY
Glucose-Capillary: 108 mg/dL — ABNORMAL HIGH (ref 70–99)
Glucose-Capillary: 110 mg/dL — ABNORMAL HIGH (ref 70–99)
Glucose-Capillary: 132 mg/dL — ABNORMAL HIGH (ref 70–99)
Glucose-Capillary: 153 mg/dL — ABNORMAL HIGH (ref 70–99)

## 2021-02-21 LAB — RENAL FUNCTION PANEL
Albumin: 2.5 g/dL — ABNORMAL LOW (ref 3.5–5.0)
Anion gap: 7 (ref 5–15)
BUN: 14 mg/dL (ref 8–23)
CO2: 23 mmol/L (ref 22–32)
Calcium: 8.4 mg/dL — ABNORMAL LOW (ref 8.9–10.3)
Chloride: 106 mmol/L (ref 98–111)
Creatinine, Ser: 1.04 mg/dL (ref 0.61–1.24)
GFR, Estimated: >60 mL/min (ref >60)
Glucose, Bld: 109 mg/dL — ABNORMAL HIGH (ref 70–99)
Phosphorus: 3.1 mg/dL (ref 2.5–4.6)
Potassium: 3.5 mmol/L (ref 3.5–5.1)
Sodium: 136 mmol/L (ref 135–145)

## 2021-02-21 LAB — BPAM RBC
Blood Product Expiration Date: 202301022359
ISSUE DATE / TIME: 202212131119
Unit Type and Rh: 5100

## 2021-02-21 LAB — TYPE AND SCREEN
ABO/RH(D): O POS
Antibody Screen: NEGATIVE
Unit division: 0

## 2021-02-21 LAB — CREATININE, SERUM
Creatinine, Ser: 1.03 mg/dL (ref 0.61–1.24)
GFR, Estimated: >60 mL/min (ref >60)

## 2021-02-21 LAB — CBC
HCT: 25.1 % — ABNORMAL LOW (ref 39.0–52.0)
Hemoglobin: 8.4 g/dL — ABNORMAL LOW (ref 13.0–17.0)
MCH: 28.9 pg (ref 26.0–34.0)
MCHC: 33.5 g/dL (ref 30.0–36.0)
MCV: 86.3 fL (ref 80.0–100.0)
Platelets: 349 10*3/uL (ref 150–400)
RBC: 2.91 MIL/uL — ABNORMAL LOW (ref 4.22–5.81)
RDW: 15.8 % — ABNORMAL HIGH (ref 11.5–15.5)
WBC: 10.1 10*3/uL (ref 4.0–10.5)
nRBC: 0 % (ref 0.0–0.2)

## 2021-02-21 LAB — MAGNESIUM: Magnesium: 2.2 mg/dL (ref 1.7–2.4)

## 2021-02-21 MED ORDER — CLOPIDOGREL BISULFATE 75 MG PO TABS
75.0000 mg | ORAL_TABLET | Freq: Every day | ORAL | Status: DC
  Administered 2021-02-21 – 2021-02-23 (×3): 75 mg via ORAL
  Filled 2021-02-21 (×3): qty 1

## 2021-02-21 NOTE — Progress Notes (Signed)
Progress Note    02/21/2021 8:37 AM 2 Days Post-Op  Subjective:  no complaints   Vitals:   02/21/21 0429 02/21/21 0718  BP: 105/74 127/79  Pulse: 93 88  Resp: 18 17  Temp: 99.8 F (37.7 C) 98.4 F (36.9 C)  SpO2: 93% 94%   Physical Exam: Lungs:  non labored Incisions:  R groin without hematoma; L leg ulcer vac left in place; L TMA skin edges viable Extremities:  monophasic but brisk L DP by doppler Neurologic: A&O  CBC    Component Value Date/Time   WBC 10.1 02/21/2021 0632   RBC 2.91 (L) 02/21/2021 0632   HGB 8.4 (L) 02/21/2021 0632   HCT 25.1 (L) 02/21/2021 0632   PLT 349 02/21/2021 0632   MCV 86.3 02/21/2021 0632   MCH 28.9 02/21/2021 0632   MCHC 33.5 02/21/2021 0632   RDW 15.8 (H) 02/21/2021 0632   LYMPHSABS 1.2 02/14/2021 1317   MONOABS 0.5 02/14/2021 1317   EOSABS 0.0 02/14/2021 1317   BASOSABS 0.1 02/14/2021 1317    BMET    Component Value Date/Time   NA 136 02/21/2021 0632   K 3.5 02/21/2021 0632   CL 106 02/21/2021 0632   CO2 23 02/21/2021 0632   GLUCOSE 109 (H) 02/21/2021 0632   BUN 14 02/21/2021 0632   CREATININE 1.03 02/21/2021 0632   CREATININE 1.04 02/21/2021 0632   CALCIUM 8.4 (L) 02/21/2021 0632   GFRNONAA >60 02/21/2021 0632   GFRNONAA >60 02/21/2021 0632   GFRAA >60 07/06/2019 0407   GFRAA >60 07/06/2019 0407    INR    Component Value Date/Time   INR 1.1 02/14/2021 1344     Intake/Output Summary (Last 24 hours) at 02/21/2021 0837 Last data filed at 02/21/2021 6256 Gross per 24 hour  Intake 1556 ml  Output 3665 ml  Net -2109 ml     Assessment/Plan:  65 y.o. male is s/p L pop angioplasty, TMA, wound debridement with skin substitute 2 Days Post-Op   L foot well perfused with DP by doppler TMA is well appearing, dressing changed; ambulate with darco shoe; heel weightbearing only Continue vac over myriad to left lateral leg Ok for discharge if ambulating with darco shoe; he will need HH RN to change vac dressing 3 days  a week starting 12/19   Dagoberto Ligas, PA-C [facility] [phone] 02/21/2021 8:37 AM

## 2021-02-21 NOTE — Progress Notes (Addendum)
Physical Therapy Treatment Patient Details Name: Adam Perez MRN: [id number] DOB: 11/01/1955 Today's Date: 02/21/2021   History of Present Illness Pt is a 65 y.o. male admitted 02/14/21 with L hip pain and osteomyelitis and ischemic ulceration of L foot. XR negative for bony changes. S/p LLE angiogram 12/8. S/p L popliteal angioplasty, calf debridement, and L transmetatarsal amputation 12/12. PMH includes: CVA with reisudal left side weakness, HTN, DM type II.    PT Comments    Today's session focused on functional mobility. Pt limited by generalized weakness, pain, reduced safety awareness, and impaired balance. Attempted standing multiple times/ways but unable to achieve a full stand, able to partially stand with Max A +2. Pt showed improved tolerance to sitting this session and had no complaints of dizziness. Pt motivated to participate despite significant pain. Remains a great candidate for intensive therapy prior to return home. Will continue to follow acutely to address established goals.   Orthostatic BPs Supine 140/76  Sitting 136/68  Post-standing 141/93     Recommendations for follow up therapy are one component of a multi-disciplinary discharge planning process, led by the attending physician.  Recommendations may be updated based on patient status, additional functional criteria and insurance authorization.  Follow Up Recommendations  Acute inpatient rehab (3hours/day)     Assistance Recommended at Discharge Frequent or constant Supervision/Assistance  Equipment Recommendations  Rolling walker (2 wheels);Wheelchair (measurements PT);Wheelchair cushion (measurements PT);BSC/3in1 (TBD)    Recommendations for Other Services       Precautions / Restrictions Precautions Precautions: Fall Required Braces or Orthoses: Other Brace Other Brace: darco shoe, unable to apply today due to bandaging Restrictions Weight Bearing Restrictions: Yes LLE Weight Bearing: Partial  weight bearing LLE Partial Weight Bearing Percentage or Pounds: through heel only     Mobility  Bed Mobility Overal bed mobility: Needs Assistance Bed Mobility: Supine to Sit;Sit to Supine     Supine to sit: +2 for physical assistance;Mod assist Sit to supine: +2 for physical assistance;Mod assist   General bed mobility comments: heavy reliance on rail, HOB up, assist for LEs over EOB and to raise trunk, guided upper body and assisted LEs back into bed    Transfers Overall transfer level: Needs assistance Equipment used: Rolling walker (2 wheels) Transfers: Sit to/from Stand Sit to Stand: +2 physical assistance;Max assist;From elevated surface           General transfer comment: Attempted to stand multiple times with different techniques. Cues for hand placement with RW, assist to rise, unable to fully stand. Stand pivot to recliner deferred due to pt needing to have a BM.    Ambulation/Gait                   Stairs             Wheelchair Mobility    Modified Rankin (Stroke Patients Only)       Balance Overall balance assessment: Needs assistance Sitting-balance support: Feet supported;No upper extremity supported Sitting balance-Leahy Scale: Fair     Standing balance support: Bilateral upper extremity supported Standing balance-Leahy Scale: Poor Standing balance comment: +2 assist and RW for partial stand                            Cognition Arousal/Alertness: Awake/alert Behavior During Therapy: WFL for tasks assessed/performed;Anxious Overall Cognitive Status: No family/caregiver present to determine baseline cognitive functioning Area of Impairment: Safety/judgement;Problem solving;Awareness;Following commands  Following Commands: Follows one step commands with increased time Safety/Judgement: Decreased awareness of safety Awareness: Emergent Problem Solving: Slow processing;Difficulty  sequencing;Requires verbal cues General Comments: Pt stating he had been thinking about how he was going get to the chair all day, allowed pt to attempt, but unsuccessful using bed rail with HOB up.        Exercises      General Comments General comments (skin integrity, edema, etc.): Monitored for orthostatic hypotension throughout session. Pt's L hip flexed/abducted and knee flexed resting in bed. Pt unable to achieve full knee extension actively, achieved a few more degrees passively but not close to full extension.      Pertinent Vitals/Pain Pain Assessment: Faces Faces Pain Scale: Hurts even more Pain Location: L foot Pain Descriptors / Indicators: Sore;Sharp;Constant Pain Intervention(s): Monitored during session;Repositioned    Home Living Family/patient expects to be discharged to:: Private residence Living Arrangements: Alone Available Help at Discharge: Family;Available PRN/intermittently Type of Home: House Home Access: Level entry       Home Layout: One level Home Equipment: Cane - quad;Electric scooter Additional Comments: Pt lives alone but has family nearby if he needs help. [facility] in [facility].    Prior Function            PT Goals (current goals can now be found in the care plan section) Acute Rehab PT Goals Patient Stated Goal: to get out of bed PT Goal Formulation: With patient Time For Goal Achievement: 03/02/21 Potential to Achieve Goals: Fair Progress towards PT goals: Progressing toward goals    Frequency    Min 3X/week      PT Plan Current plan remains appropriate    Co-evaluation PT/OT/SLP Co-Evaluation/Treatment: Yes Reason for Co-Treatment: Necessary to address cognition/behavior during functional activity;For patient/therapist safety PT goals addressed during session: Mobility/safety with mobility;Proper use of DME;Strengthening/ROM       AM-PAC PT "6 Clicks" Mobility   Outcome Measure   Help needed turning from your back to your side while in a flat bed without using bedrails?: A Little Help needed moving from lying on your back to sitting on the side of a flat bed without using bedrails?: Total Help needed moving to and from a bed to a chair (including a wheelchair)?: Total Help needed standing up from a chair using your arms (e.g., wheelchair or bedside chair)?: Total Help needed to walk in hospital room?: Total Help needed climbing 3-5 steps with a railing? : Total 6 Click Score: 8    End of Session Equipment Utilized During Treatment: Gait belt Activity Tolerance: Patient limited by pain;Patient limited by fatigue Patient left: in bed;with call bell/phone within reach;with bed alarm set (on bedpan) Nurse Communication: Mobility status (pt on bedpan) PT Visit Diagnosis: Other abnormalities of gait and mobility (R26.89);Muscle weakness (generalized) (M62.81);Difficulty in walking, not elsewhere classified (R26.2);Pain;Unsteadiness on feet (R26.81) Pain - Right/Left: Left Pain - part of body: Ankle and joints of foot     Time: 0251-0327 PT Time Calculation (min) (ACUTE ONLY): 36 min  Charges:  $Therapeutic Activity: 8-22 mins                     Daryel November, SPT    Daryel November 02/21/2021, 5:23 PM

## 2021-02-21 NOTE — Evaluation (Signed)
Occupational Therapy Re-Evaluation Patient Details Name: Adam Perez MRN: [id number] DOB: 12-Jul-1955 Today's Date: 02/21/2021   History of Present Illness Pt is a 65 y.o. male admitted 02/14/21 with L hip pain and osteomyelitis and ischemic ulceration of L foot. XR negative for bony changes. S/p LLE angiogram 12/8. S/p L popliteal angioplasty, calf debridement, and L transmetatarsal amputation 12/12. PMH includes: CVA with reisudal left side weakness, HTN, DM type II.   Clinical Impression   Pt reassessed s/p transmetatarsal amputation. Pt requiring +2 mod to max assist for mobility. Able to stand from elevated bed, not fully upright due to pain and weakness. Pt requires set up to total assist for ADL. He demonstrates decreased awareness of safety and deficits. Recommending intensive rehab prior to return home. Will follow acutely.      Recommendations for follow up therapy are one component of a multi-disciplinary discharge planning process, led by the attending physician.  Recommendations may be updated based on patient status, additional functional criteria and insurance authorization.   Follow Up Recommendations  Acute inpatient rehab (3hours/day)    Assistance Recommended at Discharge Frequent or constant Supervision/Assistance  Functional Status Assessment  Patient has had a recent decline in their functional status and demonstrates the ability to make significant improvements in function in a reasonable and predictable amount of time.  Equipment Recommendations  Other (comment) (defer to next venue)    Recommendations for Other Services       Precautions / Restrictions Precautions Precautions: Fall Required Braces or Orthoses: Other Brace Other Brace: darco shoe, unable to apply today due to bandaging Restrictions Weight Bearing Restrictions: Yes LLE Weight Bearing: Partial weight bearing LLE Partial Weight Bearing Percentage or Pounds: through heel      Mobility Bed  Mobility Overal bed mobility: Needs Assistance Bed Mobility: Supine to Sit;Sit to Supine     Supine to sit: +2 for physical assistance;Mod assist Sit to supine: +2 for physical assistance;Mod assist   General bed mobility comments: heavy reliance on rail, HOB up, assist for LEs over EOB and to raise trunk, guided upper body and assisted LEs back into bed    Transfers Overall transfer level: Needs assistance Equipment used: Rolling walker (2 wheels) Transfers: Sit to/from Stand Sit to Stand: +2 physical assistance;Max assist;From elevated surface           General transfer comment: cues for hand placement with RW, assist to rise, unable to fully stand      Balance Overall balance assessment: Needs assistance   Sitting balance-Leahy Scale: Fair     Standing balance support: Bilateral upper extremity supported Standing balance-Leahy Scale: Poor Standing balance comment: +2 assist and RW for partial stand                           ADL either performed or assessed with clinical judgement   ADL Overall ADL's : Needs assistance/impaired Eating/Feeding: Set up;Bed level   Grooming: Set up;Bed level   Upper Body Bathing: Minimal assistance;Sitting   Lower Body Bathing: Total assistance;Sit to/from stand;+2 for physical assistance   Upper Body Dressing : Minimal assistance;Sitting   Lower Body Dressing: +2 for physical assistance;Total assistance;Sit to/from stand       Toileting- Architect and Hygiene: Total assistance;Bed level               Vision Baseline Vision/History: 1 Wears glasses Ability to See in Adequate Light: 0 Adequate Patient Visual Report: No change from  baseline       Perception     Praxis      Pertinent Vitals/Pain Pain Assessment: Faces Faces Pain Scale: Hurts even more Pain Location: L foot Pain Descriptors / Indicators: Sore;Sharp;Constant Pain Intervention(s): Monitored during session;Repositioned      Hand Dominance Right   Extremity/Trunk Assessment Upper Extremity Assessment Upper Extremity Assessment: LUE deficits/detail LUE Deficits / Details: residual weakness from prior stroke, spasticity with decreased elbow extension and shoulder movement LUE Coordination: decreased fine motor;decreased gross motor   Lower Extremity Assessment Lower Extremity Assessment: Defer to PT evaluation   Cervical / Trunk Assessment Cervical / Trunk Assessment: Other exceptions (obesity)   Communication Communication Communication: Expressive difficulties   Cognition Arousal/Alertness: Awake/alert Behavior During Therapy: WFL for tasks assessed/performed;Anxious Overall Cognitive Status: No family/caregiver present to determine baseline cognitive functioning Area of Impairment: Safety/judgement;Problem solving;Awareness;Following commands                       Following Commands: Follows one step commands with increased time Safety/Judgement: Decreased awareness of safety   Problem Solving: Slow processing;Difficulty sequencing;Requires verbal cues General Comments: Pt stating he had been thinking about how he was going get to the chair all day, allowed pt to attempt, but unsuccessful using bed rail with HOB up.     General Comments       Exercises     Shoulder Instructions      Home Living Family/patient expects to be discharged to:: Private residence Living Arrangements: Alone Available Help at Discharge: Family;Available PRN/intermittently Type of Home: House Home Access: Level entry     Home Layout: One level     Bathroom Shower/Tub: Tub/shower unit         Home Equipment: Cane - quad;Electric scooter   Additional Comments: Pt lives alone but has family nearby if he needs help. Says [facility] him [facility] a walk in shower.      Prior Functioning/Environment Prior Level of Function : Independent/Modified Independent;Driving              Mobility Comments: Ambulated in home and community with quad cane ADLs Comments: Independent in ADL, IADL, driving        OT Problem List: Decreased activity tolerance;Impaired UE functional use;Pain;Increased edema;Impaired balance (sitting and/or standing);Decreased strength;Decreased knowledge of use of DME or AE;Decreased cognition;Decreased safety awareness;Obesity      OT Treatment/Interventions: Self-care/ADL training;Therapeutic activities;DME and/or AE instruction;Patient/family education    OT Goals(Current goals can be found in the care plan section) Acute Rehab OT Goals OT Goal Formulation: With patient Time For Goal Achievement: 03/07/21 Potential to Achieve Goals: Good  OT Frequency: Min 2X/week   Barriers to D/C:            Co-evaluation PT/OT/SLP Co-Evaluation/Treatment: Yes Reason for Co-Treatment: For patient/therapist safety;Necessary to address cognition/behavior during functional activity   OT goals addressed during session: ADL's and self-care      AM-PAC OT "6 Clicks" Daily Activity     Outcome Measure Help from another person eating meals?: None Help from another person taking care of personal grooming?: A Little Help from another person toileting, which includes using toliet, bedpan, or urinal?: Total Help from another person bathing (including washing, rinsing, drying)?: A Lot Help from another person to put on and taking off regular upper body clothing?: A Little Help from another person to put on and taking off regular lower body clothing?: Total 6 Click Score: 14   End  of Session Equipment Utilized During Treatment: Gait belt;Rolling walker (2 wheels) Nurse Communication: Other (comment) (on bed pan)  Activity Tolerance: Patient tolerated treatment well Patient left: in bed;with call bell/phone within reach;with bed alarm set (on bedpan)  OT Visit Diagnosis: Unsteadiness on feet (R26.81);Muscle weakness (generalized) (M62.81);Hemiplegia and  hemiparesis;Pain;Other symptoms and signs involving cognitive function Hemiplegia - dominant/non-dominant: Non-Dominant Hemiplegia - caused by: Cerebral infarction                Time: 0865-7846 OT Time Calculation (min): 36 min Charges:  OT General Charges $OT Visit: 1 Visit OT Evaluation $OT Re-eval: 1 Re-eval  Martie Round, OTR/L Acute Rehabilitation Services Pager: [phone] Office: [phone]   Evern Bio 02/21/2021, 4:24 PM

## 2021-02-21 NOTE — Progress Notes (Signed)
Inpatient Rehabilitation Admissions Coordinator   I met at bedside with patient and his cousin, Abe People, I discussed goals and expectations of a possible CIR admit. He is in agreement,. I am hopeful for a bed in the next 1 to 2 days.  Danne Baxter, RN, MSN Rehab Admissions Coordinator [phone] 02/21/2021 11:59 AM

## 2021-02-21 NOTE — Progress Notes (Addendum)
PROGRESS NOTE    Adam Perez  YIR:[id number] DOB: 01/17/1956 DOA: 02/14/2021 PCP: [facility]    Chief Complaint  Patient presents with   Knee Pain    Brief Narrative:   65 year old male with prior history of CVA, left-sided spastic hemiparesis, insulin-dependent diabetes mellitus, peripheral vascular disease, PTSD, GAD and, neurocognitive deficits presents with left hip pain, left foot wound.  He was found to have cellulitis and osteomyelitis of the left second and third toe as noted on the MRI. Patient was started on IV vancomycin, Rocephin and Flagyl.  Vascular surgery and orthopedic surgery consulted.  Patient underwent lower extremity angiography showing critical stenosis at the popliteal artery to TP trunk junction, occluded anterior and posterior tibial artery.  Patient underwent left lower extremity angiography, left popliteal angioplasty, left TMT and excisional debridement of left calf venous ulcer with a skin substitute to left calf wound on 02/19/2021.   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 S/p left lower extremity angiography, left popliteal angioplasty, left TMT and excisional debridement of left calf venous ulcer with a skin substitute to left calf wound on 02/19/2021. Completed the course of IV antibiotics.  Continue with IV morphine, Norco  and Robaxin. Restart Plavix today.   Acute blood loss anemia secondary to the procedure. Anemia panel consistent with anemia of chronic disease. S/p 1 unit of PRBC transfusion.  Hemoglobin stable around 8.   Presyncope Possibly from vasovagal while straining. Patient currently denies any dizziness or chest pain.    Insulin-dependent diabetes mellitus peripheral vascular disease, hyperglycemia Last hemoglobin A1c around 6.1% CBG (last 3)  Recent Labs    02/20/21 2111 02/21/21 0608 02/21/21 1120  GLUCAP 135* 108* 110*   Resume SSI.    History of prior CVA with residual left-sided spastic hemiparesis Continue with aspirin, Plavix and statin.    Essential hypertension Blood pressure parameters appear to be optimal    Body mass index is 37.74 kg/m. Morbid obesity the setting of diabetes peripheral vascular disease Increased mortality and morbidity at this time.     History of PTSD/anxiety and depression Continue with home medications.    DVT prophylaxis: (Lovenox) Code Status: (Full Code) Family Communication: (none at bedside) Disposition:   Status is: Inpatient  Remains inpatient appropriate because: PT evaluation./ CIR.       Consultants:  Vascular surgery  Procedures: (Lower extremity angiography on 02/15/2021 Left lower extremity angiography, left popliteal angioplasty, excisional debridement of left calf venous ulcer on 02/19/2021.  Antimicrobials: ( Antibiotics Given (last 72 hours)     Date/Time Action Medication Dose Rate   02/18/21 1541 New Bag/Given   vancomycin (VANCOREADY) IVPB 1500 mg/300 mL 1,500 mg 150 mL/hr   02/18/21 1838 New Bag/Given   cefTRIAXone (ROCEPHIN) 2 g in sodium chloride 0.9 % 100 mL IVPB 2 g 200 mL/hr   02/18/21 1839 New Bag/Given   metroNIDAZOLE (FLAGYL) IVPB 500 mg 500 mg 100 mL/hr  02/19/21 0503 New Bag/Given   metroNIDAZOLE (FLAGYL) IVPB 500 mg 500 mg 100 mL/hr   02/19/21 1507 New Bag/Given   vancomycin  (VANCOREADY) IVPB 1500 mg/300 mL 1,500 mg 150 mL/hr   02/19/21 1715 New Bag/Given   cefTRIAXone (ROCEPHIN) 2 g in sodium chloride 0.9 % 100 mL IVPB 2 g 200 mL/hr   02/19/21 1807 New Bag/Given   metroNIDAZOLE (FLAGYL) IVPB 500 mg 500 mg 100 mL/hr   02/20/21 0434 New Bag/Given   metroNIDAZOLE (FLAGYL) IVPB 500 mg 500 mg 100 mL/hr   02/20/21 1522 New Bag/Given   vancomycin (VANCOREADY) IVPB 1500 mg/300 mL 1,500 mg 150 mL/hr   02/20/21 1735 New Bag/Given   cefTRIAXone (ROCEPHIN) 2 g in sodium chloride 0.9 % 100 mL IVPB 2 g 200 mL/hr   02/20/21 1853 New Bag/Given   metroNIDAZOLE (FLAGYL) IVPB 500 mg 500 mg 100 mL/hr   02/21/21 0546 New Bag/Given   metroNIDAZOLE (FLAGYL) IVPB 500 mg 500 mg 100 mL/hr        Subjective: Pain not well controlled.   Objective: Vitals:   02/21/21 0429 02/21/21 0718 02/21/21 0845 02/21/21 1118  BP: 105/74 127/79 115/71 123/82  Pulse: 93 88 88 91  Resp: 18 17  18   Temp: 99.8 F (37.7 C) 98.4 F (36.9 C)  98 F (36.7 C)  TempSrc: Oral Oral  Oral  SpO2: 93% 94%  98%  Weight:      Height:        Intake/Output Summary (Last 24 hours) at 02/21/2021 1119 Last data filed at 02/21/2021 0847 Gross per 24 hour  Intake 1436 ml  Output 3565 ml  Net -2129 ml   Filed Weights   02/14/21 2150 02/14/21 2249 02/15/21 1439  Weight: 108.6 kg 109.1 kg 109.3 kg    Examination:  General exam: in moderate distress from pain. Respiratory system: Clear to auscultation. Respiratory effort normal. Cardiovascular system: S1 & S2 heard, RRR. No JVD,  No pedal edema. Gastrointestinal system: Abdomen is nondistended, soft and non tender. Normal bowel sounds heard. Central nervous system: Alert and oriented. No focal neurological deficits. Extremities: left leg bandaged connected with wound vac.  Skin: No rashes, lesions or ulcers Psychiatry:  Mood & affect appropriate.     Data Reviewed: I have personally reviewed following labs and imaging studies  CBC: Recent  Labs  Lab 02/14/21 1317 02/15/21 0045 02/16/21 0435 02/19/21 0603 02/19/21 0715 02/20/21 0430 02/20/21 1021 02/20/21 1622 02/21/21 0632  WBC 7.7   < > 6.9 6.3  --  9.3 10.5  --  10.1  NEUTROABS 6.0  --   --   --   --   --   --   --   --   HGB 10.7*   < > 9.2* 8.9* 9.2* 7.1* 8.2* 8.6* 8.4*  HCT 33.3*   < > 27.5* 27.0* 27.0* 21.2* 25.1* 26.1* 25.1*  MCV 89.5   < > 86.8 86.5  --  86.5 88.4  --  86.3  PLT 362   < > 373 366  --  313 350  --  349   < > = values in this interval not displayed.    Basic Metabolic Panel: Recent Labs  Lab 02/15/21 0045 02/16/21 0435 02/19/21 0602 02/19/21 0603 02/19/21 0715 02/20/21 0430 02/21/21 0632  NA 141 140 139  --  142 135 136  K 3.6 3.9 3.9  --  3.8 3.9 3.5  CL 107 107 108  --  108 106 106  CO2  22 21* 22  --   --  23 23  GLUCOSE 145* 118* 107*  --  117* 105* 109*  BUN 17 16 22   --  21 17 14   CREATININE 1.19 1.09   1.10 1.07  --  1.00 1.11 1.03   1.04  CALCIUM 9.1 8.9 8.9  --   --  8.5* 8.4*  MG  --  2.3  --  2.3  --  2.2 2.2  PHOS  --  3.4 3.8  --   --  3.4 3.1    GFR: Estimated Creatinine Clearance: 83.2 mL/min (by C-G formula based on SCr of 1.03 mg/dL).  Liver Function Tests: Recent Labs  Lab 02/14/21 1317 02/16/21 0435 02/19/21 0602 02/20/21 0430 02/21/21 0632  AST 36  --   --   --   --   ALT 26  --   --   --   --   ALKPHOS 82  --   --   --   --   BILITOT 1.2  --   --   --   --   PROT 9.1*  --   --   --   --   ALBUMIN 4.0 3.1* 3.0* 2.4* 2.5*    CBG: Recent Labs  Lab 02/20/21 1006 02/20/21 1113 02/20/21 1628 02/20/21 2111 02/21/21 0608  GLUCAP 142* 130* 143* 135* 108*     Recent Results (from the past 240 hour(s))  Culture, blood (routine x 2)     Status: None   Collection Time: 02/14/21  1:24 PM   Specimen: BLOOD  Result Value Ref Range Status   Specimen Description BLOOD RIGHT ANTECUBITAL  Final   Special Requests   Final    BOTTLES DRAWN AEROBIC AND ANAEROBIC Blood Culture adequate volume   Culture    Final    NO GROWTH 5 DAYS Performed at [facility], [address] [place], [place] [address]    Report Status 02/19/2021 FINAL  Final  Culture, blood (routine x 2)     Status: None   Collection Time: 02/14/21  1:44 PM   Specimen: BLOOD  Result Value Ref Range Status   Specimen Description BLOOD BLOOD LEFT HAND  Final   Special Requests   Final    BOTTLES DRAWN AEROBIC AND ANAEROBIC Blood Culture adequate volume   Culture   Final    NO GROWTH 5 DAYS Performed at [facility], [address] [place], [place] [address]    Report Status 02/19/2021 FINAL  Final  Resp Panel by RT-PCR (Flu A&B, Covid) Nasopharyngeal Swab     Status: None   Collection Time: 02/14/21  3:37 PM   Specimen: Nasopharyngeal Swab; Nasopharyngeal(NP) swabs in vial transport medium  Result Value Ref Range Status   SARS Coronavirus 2 by RT PCR NEGATIVE NEGATIVE Final    Comment: (NOTE) SARS-CoV-2 target nucleic acids are NOT DETECTED.  The SARS-CoV-2 RNA is generally detectable in upper respiratory specimens during the acute phase of infection. The lowest concentration of SARS-CoV-2 viral copies this assay can detect is 138 copies/mL. A negative result does not preclude SARS-Cov-2 infection and should not be used as the sole basis for treatment or other patient management decisions. A negative result may occur with  improper specimen collection/handling, submission of specimen other than nasopharyngeal swab, presence of viral mutation(s) within the areas targeted by this assay, and inadequate number of viral copies(<138 copies/mL). A negative result must be combined with clinical observations, patient history, and epidemiological information. The  expected result is Negative.  Fact Sheet for Patients:  EntrepreneurPulse.com.au  Fact Sheet for Healthcare Providers:  IncredibleEmployment.be  This test is no t yet approved or cleared by the [place] FDA and  has been  authorized for detection and/or diagnosis of SARS-CoV-2 by FDA under an Emergency Use Authorization (EUA). This EUA will remain  in effect (meaning this test can be used) for the duration of the COVID-19 declaration under Section 564(b)(1) of the Act, 21 U.S.C.section 360bbb-3(b)(1), unless the authorization is terminated  or revoked sooner.       Influenza A by PCR NEGATIVE NEGATIVE Final   Influenza B by PCR NEGATIVE NEGATIVE Final    Comment: (NOTE) The Xpert Xpress SARS-CoV-2/FLU/RSV plus assay is intended as an aid in the diagnosis of influenza from Nasopharyngeal swab specimens and should not be used as a sole basis for treatment. Nasal washings and aspirates are unacceptable for Xpert Xpress SARS-CoV-2/FLU/RSV testing.  Fact Sheet for Patients: EntrepreneurPulse.com.au  Fact Sheet for Healthcare Providers: IncredibleEmployment.be  This test is not yet approved or cleared by the [place] FDA and has been authorized for detection and/or diagnosis of SARS-CoV-2 by FDA under an Emergency Use Authorization (EUA). This EUA will remain in effect (meaning this test can be used) for the duration of the COVID-19 declaration under Section 564(b)(1) of the Act, 21 U.S.C. section 360bbb-3(b)(1), unless the authorization is terminated or revoked.  Performed at [facility], [address] [place], [place] [address]          Radiology Studies: No results found.      Scheduled Meds:  (feeding supplement) PROSource Plus  30 mL Oral BID WC   sodium chloride   Intravenous Once   amLODipine  10 mg Oral Daily   aspirin EC  81 mg Oral Q breakfast   atorvastatin  40 mg Oral Daily   enoxaparin (LOVENOX) injection  55 mg Subcutaneous QHS   insulin aspart  0-15 Units Subcutaneous TID WC   insulin aspart  0-5 Units Subcutaneous QHS   insulin glargine-yfgn  10 Units Subcutaneous QHS   lurasidone  20 mg Oral Daily   multivitamin with  minerals  1 tablet Oral Daily   sertraline  150 mg Oral Daily   traZODone  100 mg Oral QHS   Continuous Infusions:  methocarbamol (ROBAXIN) IV Stopped (02/14/21 2125)     LOS: 7 days        Hosie Poisson, MD [facility]   To contact the attending provider between 7A-7P or the covering provider during after hours 7P-7A, please log into the web site [email or web address] and access using universal [place] password for that web site. If you do not have the password, please call the hospital operator.  02/21/2021, 11:19 AM

## 2021-02-22 LAB — CBC WITH DIFFERENTIAL/PLATELET
Abs Immature Granulocytes: 0.03 10*3/uL (ref 0.00–0.07)
Basophils Absolute: 0 10*3/uL (ref 0.0–0.1)
Basophils Relative: 0 %
Eosinophils Absolute: 0.1 10*3/uL (ref 0.0–0.5)
Eosinophils Relative: 1 %
HCT: 24.6 % — ABNORMAL LOW (ref 39.0–52.0)
Hemoglobin: 8.5 g/dL — ABNORMAL LOW (ref 13.0–17.0)
Immature Granulocytes: 0 %
Lymphocytes Relative: 14 %
Lymphs Abs: 1.3 10*3/uL (ref 0.7–4.0)
MCH: 29.4 pg (ref 26.0–34.0)
MCHC: 34.6 g/dL (ref 30.0–36.0)
MCV: 85.1 fL (ref 80.0–100.0)
Monocytes Absolute: 1.1 10*3/uL — ABNORMAL HIGH (ref 0.1–1.0)
Monocytes Relative: 11 %
Neutro Abs: 7 10*3/uL (ref 1.7–7.7)
Neutrophils Relative %: 74 %
Platelets: 329 10*3/uL (ref 150–400)
RBC: 2.89 MIL/uL — ABNORMAL LOW (ref 4.22–5.81)
RDW: 15.8 % — ABNORMAL HIGH (ref 11.5–15.5)
WBC: 9.6 10*3/uL (ref 4.0–10.5)
nRBC: 0 % (ref 0.0–0.2)

## 2021-02-22 LAB — GLUCOSE, CAPILLARY
Glucose-Capillary: 110 mg/dL — ABNORMAL HIGH (ref 70–99)
Glucose-Capillary: 125 mg/dL — ABNORMAL HIGH (ref 70–99)
Glucose-Capillary: 144 mg/dL — ABNORMAL HIGH (ref 70–99)
Glucose-Capillary: 116 mg/dL — ABNORMAL HIGH (ref 70–99)

## 2021-02-22 LAB — BASIC METABOLIC PANEL
Anion gap: 6 (ref 5–15)
BUN: 13 mg/dL (ref 8–23)
CO2: 24 mmol/L (ref 22–32)
Calcium: 8.6 mg/dL — ABNORMAL LOW (ref 8.9–10.3)
Chloride: 110 mmol/L (ref 98–111)
Creatinine, Ser: 0.87 mg/dL (ref 0.61–1.24)
GFR, Estimated: >60 mL/min (ref >60)
Glucose, Bld: 119 mg/dL — ABNORMAL HIGH (ref 70–99)
Potassium: 3.8 mmol/L (ref 3.5–5.1)
Sodium: 140 mmol/L (ref 135–145)

## 2021-02-22 MED ORDER — ENSURE ENLIVE PO LIQD: 237.0000 mL | Freq: Two times a day (BID) | ORAL | Status: DC

## 2021-02-22 MED ORDER — GLUCERNA SHAKE PO LIQD
237.0000 mL | Freq: Three times a day (TID) | ORAL | Status: DC
  Administered 2021-02-22 – 2021-02-23 (×3): 237 mL via ORAL

## 2021-02-22 NOTE — Progress Notes (Signed)
Inpatient Rehabilitation Admissions Coordinator   I await bed availability to admit to CIR. Hopeful for Friday. I met with patient at bedside and he is aware.  Danne Baxter, RN, MSN Rehab Admissions Coordinator [phone] 02/22/2021 11:07 AM

## 2021-02-22 NOTE — Progress Notes (Signed)
PROGRESS NOTE    Adam Perez  [id number] DOB: 12/08/1955 DOA: 02/14/2021 PCP: [facility]    Chief Complaint  Patient presents with   Knee Pain    Brief Narrative:   65 year old male with prior history of CVA, left-sided spastic hemiparesis, insulin-dependent diabetes mellitus, peripheral vascular disease, PTSD, GAD and, neurocognitive deficits presents with left hip pain, left foot wound.  He was found to have cellulitis and osteomyelitis of the left second and third toe as noted on the MRI. Patient was started on IV vancomycin, Rocephin and Flagyl.  Vascular surgery and orthopedic surgery consulted.  Patient underwent lower extremity angiography showing critical stenosis at the popliteal artery to TP trunk junction, occluded anterior and posterior tibial artery.  Patient underwent left lower extremity angiography, left popliteal angioplasty, left TMT and excisional debridement of left calf venous ulcer with a skin substitute to left calf wound on 02/19/2021.   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 S/p left lower extremity angiography, left popliteal angioplasty, left TMT and excisional debridement of left calf venous ulcer with a skin substitute to left calf wound on 02/19/2021. Completed the course of IV antibiotics.  Continue with IV morphine, Norco  and Robaxin. Therapy eval recommending CIR.    Acute blood loss anemia secondary to the procedure. Anemia panel consistent with anemia of chronic disease. S/p 1 unit of PRBC transfusion.  Hemoglobin stable around 8.   Presyncope Possibly from vasovagal while straining. Patient currently denies any dizziness or chest pain.    Insulin-dependent diabetes mellitus peripheral vascular disease, hyperglycemia Last hemoglobin A1c around 6.1% CBG (last 3)  Recent Labs    02/21/21 2127 02/22/21 0701 02/22/21 1150  GLUCAP 153* 110* 125*    Resume SSI.    History of prior CVA with residual left-sided spastic hemiparesis Continue with aspirin, Plavix and statin.    Essential hypertension Blood pressure parameters are well controlled.     Body mass index is 37.74 kg/m. Morbid obesity the setting of diabetes peripheral vascular disease Increased mortality and morbidity at this time.     History of PTSD/anxiety and depression Continue with home medications.    DVT prophylaxis: (Lovenox) Code Status: (Full Code) Family Communication: (none at bedside) Disposition:   Status is: Inpatient  Remains inpatient appropriate because: PT evaluation./ CIR.       Consultants:  Vascular surgery  Procedures: (Lower extremity angiography on 02/15/2021 Left lower extremity angiography, left popliteal angioplasty, excisional debridement of left calf venous ulcer on 02/19/2021.  Antimicrobials: ( Antibiotics Given (last 72 hours)     Date/Time Action Medication Dose Rate   02/19/21 1507 New Bag/Given   vancomycin (VANCOREADY) IVPB 1500 mg/300 mL 1,500 mg 150 mL/hr   02/19/21 1715 New Bag/Given   cefTRIAXone (ROCEPHIN) 2 g in sodium chloride 0.9 % 100 mL IVPB 2 g 200 mL/hr   02/19/21 1807 New Bag/Given   metroNIDAZOLE (FLAGYL) IVPB 500 mg 500  mg 100 mL/hr   02/20/21 0434 New Bag/Given   metroNIDAZOLE (FLAGYL) IVPB 500 mg 500 mg 100 mL/hr   02/20/21 1522 New Bag/Given    vancomycin (VANCOREADY) IVPB 1500 mg/300 mL 1,500 mg 150 mL/hr   02/20/21 1735 New Bag/Given   cefTRIAXone (ROCEPHIN) 2 g in sodium chloride 0.9 % 100 mL IVPB 2 g 200 mL/hr   02/20/21 1853 New Bag/Given   metroNIDAZOLE (FLAGYL) IVPB 500 mg 500 mg 100 mL/hr   02/21/21 0546 New Bag/Given   metroNIDAZOLE (FLAGYL) IVPB 500 mg 500 mg 100 mL/hr        Subjective: No new complaints today.   Objective: Vitals:   02/22/21 0315 02/22/21 0625 02/22/21 0910 02/22/21 1155  BP: 124/83  121/79   Pulse: 80  82 81  Resp: 18  18 18   Temp: 98.2 F (36.8 C)  98.5 F (36.9 C)   TempSrc: Oral Oral Oral Oral  SpO2: 95%  95%   Weight:      Height:        Intake/Output Summary (Last 24 hours) at 02/22/2021 1257 Last data filed at 02/22/2021 1153 Gross per 24 hour  Intake 600 ml  Output 3175 ml  Net -2575 ml    Filed Weights   02/14/21 2150 02/14/21 2249 02/15/21 1439  Weight: 108.6 kg 109.1 kg 109.3 kg    Examination:  General exam: Appears calm and comfortable  Respiratory system: Clear to auscultation. Respiratory effort normal. Cardiovascular system: S1 & S2 heard, RRR. No JVD,  No pedal edema. Gastrointestinal system: Abdomen is nondistended, soft and nontender.  Normal bowel sounds heard. Central nervous system: Alert and oriented. No focal neurological deficits. Extremities: extremity bandaged and connected to wound vac. Skin: No rashes, lesions or ulcers Psychiatry:  Mood & affect appropriate.      Data Reviewed: I have personally reviewed following labs and imaging studies  CBC: Recent Labs  Lab 02/19/21 0603 02/19/21 0715 02/20/21 0430 02/20/21 1021 02/20/21 1622 02/21/21 0632 02/22/21 0820  WBC 6.3  --  9.3 10.5  --  10.1 9.6  NEUTROABS  --   --   --   --   --   --  7.0  HGB 8.9*   < > 7.1* 8.2* 8.6* 8.4* 8.5*  HCT 27.0*   < > 21.2* 25.1* 26.1* 25.1* 24.6*  MCV 86.5  --  86.5 88.4  --  86.3 85.1  PLT 366  --  313 350  --  349 329   < > = values in this  interval not displayed.     Basic Metabolic Panel: Recent Labs  Lab 02/16/21 0435 02/19/21 0602 02/19/21 0603 02/19/21 0715 02/20/21 0430 02/21/21 0632 02/22/21 0820  NA 140 139  --  142 135 136 140  K 3.9 3.9  --  3.8 3.9 3.5 3.8  CL 107 108  --  108 106 106 110  CO2 21* 22  --   --  23 23 24   GLUCOSE 118* 107*  --  117* 105* 109* 119*  BUN 16 22  --  21 17 14 13   CREATININE 1.09   1.10 1.07  --  1.00 1.11 1.03   1.04 0.87  CALCIUM 8.9 8.9  --   --  8.5* 8.4* 8.6*  MG 2.3  --  2.3  --  2.2 2.2  --   PHOS 3.4 3.8  --   --  3.4 3.1  --      GFR: Estimated Creatinine Clearance:  98.5 mL/min (by C-G formula based on SCr of 0.87 mg/dL).  Liver Function Tests: Recent Labs  Lab 02/16/21 0435 02/19/21 0602 02/20/21 0430 02/21/21 0632  ALBUMIN 3.1* 3.0* 2.4* 2.5*     CBG: Recent Labs  Lab 02/21/21 1120 02/21/21 1627 02/21/21 2127 02/22/21 0701 02/22/21 1150  GLUCAP 110* 132* 153* 110* 125*      Recent Results (from the past 240 hour(s))  Culture, blood (routine x 2)     Status: None   Collection Time: 02/14/21  1:24 PM   Specimen: BLOOD  Result Value Ref Range Status   Specimen Description BLOOD RIGHT ANTECUBITAL  Final   Special Requests   Final    BOTTLES DRAWN AEROBIC AND ANAEROBIC Blood Culture adequate volume   Culture   Final    NO GROWTH 5 DAYS Performed at [facility], [address] [place], [place] [address]    Report Status 02/19/2021 FINAL  Final  Culture, blood (routine x 2)     Status: None   Collection Time: 02/14/21  1:44 PM   Specimen: BLOOD  Result Value Ref Range Status   Specimen Description BLOOD BLOOD LEFT HAND  Final   Special Requests   Final    BOTTLES DRAWN AEROBIC AND ANAEROBIC Blood Culture adequate volume   Culture   Final    NO GROWTH 5 DAYS Performed at [facility], [address] [place], [place] [address]    Report Status 02/19/2021 FINAL  Final  Resp Panel by RT-PCR (Flu A&B, Covid) Nasopharyngeal Swab      Status: None   Collection Time: 02/14/21  3:37 PM   Specimen: Nasopharyngeal Swab; Nasopharyngeal(NP) swabs in vial transport medium  Result Value Ref Range Status   SARS Coronavirus 2 by RT PCR NEGATIVE NEGATIVE Final    Comment: (NOTE) SARS-CoV-2 target nucleic acids are NOT DETECTED.  The SARS-CoV-2 RNA is generally detectable in upper respiratory specimens during the acute phase of infection. The lowest concentration of SARS-CoV-2 viral copies this assay can detect is 138 copies/mL. A negative result does not preclude SARS-Cov-2 infection and should not be used as the sole basis for treatment or other patient management decisions. A negative result may occur with  improper specimen collection/handling, submission of specimen other than nasopharyngeal swab, presence of viral mutation(s) within the areas targeted by this assay, and inadequate number of viral copies(<138 copies/mL). A negative result must be combined with clinical observations, patient history, and epidemiological information. The expected result is Negative.  Fact Sheet for Patients:  EntrepreneurPulse.com.au  Fact Sheet for Healthcare Providers:  IncredibleEmployment.be  This test is no t yet approved or cleared by the [place] FDA and  has been authorized for detection and/or diagnosis of SARS-CoV-2 by FDA under an Emergency Use Authorization (EUA). This EUA will remain  in effect (meaning this test can be used) for the duration of the COVID-19 declaration under Section 564(b)(1) of the Act, 21 U.S.C.section 360bbb-3(b)(1), unless the authorization is terminated  or revoked sooner.       Influenza A by PCR NEGATIVE NEGATIVE Final   Influenza B by PCR NEGATIVE NEGATIVE Final    Comment: (NOTE) The Xpert Xpress SARS-CoV-2/FLU/RSV plus assay is intended as an aid in the diagnosis of influenza from Nasopharyngeal swab specimens and should not be used as a sole basis  for treatment. Nasal washings and aspirates are unacceptable for Xpert Xpress SARS-CoV-2/FLU/RSV testing.  Fact Sheet for Patients: EntrepreneurPulse.com.au  Fact Sheet for Healthcare Providers: IncredibleEmployment.be  This  test is not yet approved or cleared by the [place] and has been authorized for detection and/or diagnosis of SARS-CoV-2 by FDA under an Emergency Use Authorization (EUA). This EUA will remain in effect (meaning this test can be used) for the duration of the COVID-19 declaration under Section 564(b)(1) of the Act, 21 U.S.C. section 360bbb-3(b)(1), unless the authorization is terminated or revoked.  Performed at [facility], [address] [place], [place] [address]           Radiology Studies: No results found.      Scheduled Meds:  (feeding supplement) PROSource Plus  30 mL Oral BID WC   sodium chloride   Intravenous Once   amLODipine  10 mg Oral Daily   aspirin EC  81 mg Oral Q breakfast   atorvastatin  40 mg Oral Daily   clopidogrel  75 mg Oral Daily   enoxaparin (LOVENOX) injection  55 mg Subcutaneous QHS   feeding supplement (GLUCERNA SHAKE)  237 mL Oral TID BM   insulin aspart  0-15 Units Subcutaneous TID WC   insulin aspart  0-5 Units Subcutaneous QHS   insulin glargine-yfgn  10 Units Subcutaneous QHS   lurasidone  20 mg Oral Daily   multivitamin with minerals  1 tablet Oral Daily   sertraline  150 mg Oral Daily   traZODone  100 mg Oral QHS   Continuous Infusions:  methocarbamol (ROBAXIN) IV Stopped (02/14/21 2125)     LOS: 8 days        Hosie Poisson, MD [facility]   To contact the attending provider between 7A-7P or the covering provider during after hours 7P-7A, please log into the web site [email or web address] and access using universal [place] password for that web site. If you do not have the password, please call the hospital operator.  02/22/2021, 12:57 PM

## 2021-02-22 NOTE — PMR Pre-admission (Signed)
PMR Admission Coordinator Pre-Admission Assessment  Patient: Adam Perez is an 65 y.o., male MRN: [id number] DOB: [birth year] Height: 5' 7"  (170.2 cm) Weight: 109.3 kg  Insurance Information HMO:     PPO:      PCP:      IPA:      80/20:      OTHER:  PRIMARY: Medicare a and b      Policy#: [id number]      Subscriber: pt Benefits:  Phone #: passport one source online     Name: 12/14 Eff. Date: 08/10/2018     Deduct: $1556      Out of Pocket Max: none      Life Max: none CIR: 100%      SNF: 20 full days Outpatient: 80%     Co-Pay: 20% Home Health: 100%      Co-Pay: none DME: 80%     Co-Pay: 20% Providers: pt choice  SECONDARY: Tricare for Life      Policy#: [id number]  Financial Counselor:       Phone#:   The Data Collection Information Summary for patients in Inpatient Rehabilitation Facilities with attached Privacy Act [place] Records was provided and verbally reviewed with: Patient  Emergency Contact Information Contact Information     Name Relation Home Work Mobile   [name],[name] Spouse   [phone]      Current Medical History  Patient Admitting Diagnosis: TMA  History of Present Illness:  65 year old right-handed male with history of hypertension, CVA with left-sided residual weakness maintained on aspirin and Plavix, diabetes mellitus, obesity with BMI 37.74.  Per chart review patient currently separated from his wife lives alone with plans to stay with his wife on discharge.  1 level home with level entry.  Modified independent prior to admission.  Presented 02/14/2021 with left side hip pain and malodorous wounds in the lower extremities including the toes of the left foot.  Reports the wound has been progressing over the last 2 months and is followed at the [facility] clinic in [place].  Admission chemistry potassium 5.2 glucose 122 BUN 26 creatinine 1.31, hemoglobin 10.7, lactic acid 2.5, blood cultures no growth to date, sedimentation rate 76.  MRI of the  left foot showed marked subcutaneous soft tissue swelling edema fluid consistent with cellulitis.  Osteomyelitis involving the entire second toe and distal phalanx of the third toe.  MRI of the left hip mild bilateral hip degenerative changes but no stress fracture or AVN.  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 Jamelle Haring and wound VAC applied.  Partial weightbearing left lower extremity through heel only with Darco shoe.  Maintained on Lovenox for DVT prophylaxis.  His chronic aspirin and Plavix have been resumed.  Acute blood loss anemia 8.5 and monitored.   Patient's medical record from [facility] has been reviewed by the rehabilitation admission coordinator and physician.  Past Medical History  Past Medical History:  Diagnosis Date   Diabetes mellitus without complication ([place])    Hemiparesis ([place])    left side; residual from prior acute ischemic CVA   HTN (hypertension)    Ischemic cerebrovascular accident (CVA) ([place])    with residual left hemiparesis   Has the patient had major surgery during 100 days prior to admission? Yes  Family History   family history includes Diabetes in his brother and brother.  Current Medications  Current Facility-Administered Medications:    (feeding supplement) PROSource Plus liquid 30  mL, 30 mL, Oral, BID WC, Eveland, Matthew, PA-C, 30 mL at 02/23/21 0830   0.9 %  sodium chloride infusion (Manually program via Guardrails IV Fluids), , Intravenous, Once, Dagoberto Ligas, PA-C   amLODipine (NORVASC) tablet 10 mg, 10 mg, Oral, Daily, Dagoberto Ligas, PA-C, 10 mg at 02/23/21 5638   aspirin EC tablet 81 mg, 81 mg, Oral, Q breakfast, Dagoberto Ligas, PA-C, 81 mg at 02/23/21 7564   atorvastatin (LIPITOR) tablet 40 mg, 40 mg, Oral, Daily, Dagoberto Ligas, PA-C, 40 mg at 02/23/21 3329   clopidogrel (PLAVIX) tablet 75 mg, 75 mg, Oral, Daily, Hosie Poisson, MD, 75  mg at 02/23/21 0829   enoxaparin (LOVENOX) injection 55 mg, 55 mg, Subcutaneous, QHS, Dagoberto Ligas, PA-C, 55 mg at 02/22/21 2125   feeding supplement (GLUCERNA SHAKE) (GLUCERNA SHAKE) liquid 237 mL, 237 mL, Oral, TID BM, Hosie Poisson, MD, 237 mL at 02/23/21 0829   hydrALAZINE (APRESOLINE) injection 5 mg, 5 mg, Intravenous, Q20 Min PRN, Eveland, Matthew, PA-C   insulin aspart (novoLOG) injection 0-15 Units, 0-15 Units, Subcutaneous, TID WC, Dagoberto Ligas, PA-C, 2 Units at 02/23/21 0836   insulin aspart (novoLOG) injection 0-5 Units, 0-5 Units, Subcutaneous, QHS, Eveland, Matthew, PA-C   insulin glargine-yfgn (SEMGLEE) injection 10 Units, 10 Units, Subcutaneous, QHS, Dagoberto Ligas, PA-C, 10 Units at 02/22/21 2126   labetalol (NORMODYNE) injection 10 mg, 10 mg, Intravenous, Q10 min PRN, Dagoberto Ligas, PA-C   lurasidone (LATUDA) tablet 20 mg, 20 mg, Oral, Daily, Dagoberto Ligas, PA-C, 20 mg at 02/23/21 5188   methocarbamol (ROBAXIN) 500 mg in dextrose 5 % 50 mL IVPB, 500 mg, Intravenous, Q6H PRN, Dagoberto Ligas, PA-C, Stopped at 02/14/21 2125   morphine 2 MG/ML injection 2 mg, 2 mg, Intravenous, Q2H PRN, Dagoberto Ligas, PA-C, 2 mg at 02/21/21 1101   multivitamin with minerals tablet 1 tablet, 1 tablet, Oral, Daily, Dagoberto Ligas, PA-C, 1 tablet at 02/23/21 0828   ondansetron (ZOFRAN) injection 4 mg, 4 mg, Intravenous, Q6H PRN, Dagoberto Ligas, PA-C   oxyCODONE-acetaminophen (PERCOCET/ROXICET) 5-325 MG per tablet 1-2 tablet, 1-2 tablet, Oral, Q4H PRN, Dagoberto Ligas, PA-C, 2 tablet at 02/23/21 4166   polyethylene glycol (MIRALAX / GLYCOLAX) packet 17 g, 17 g, Oral, BID PRN, Cyndia Skeeters, Taye T, MD   senna-docusate (Senokot-S) tablet 1 tablet, 1 tablet, Oral, BID PRN, Wendee Beavers T, MD, 1 tablet at 02/22/21 2150   sertraline (ZOLOFT) tablet 150 mg, 150 mg, Oral, Daily, Dagoberto Ligas, PA-C, 150 mg at 02/23/21 0630   traZODone (DESYREL) tablet 100 mg, 100 mg, Oral, QHS, Dagoberto Ligas, PA-C, 100 mg at 02/22/21 2125  Patients Current Diet:  Diet Order             Diet regular Room service appropriate? Yes with Assist; Fluid consistency: Thin  Diet effective now           Diet - low sodium heart healthy                   Precautions / Restrictions Precautions Precautions: Fall Other Brace: darco shoe, unable to apply today due to bandaging Restrictions Weight Bearing Restrictions: Yes LLE Weight Bearing: Partial weight bearing LLE Partial Weight Bearing Percentage or Pounds:  (heel only)   Has the patient had 2 or more falls or a fall with injury in the past year? No  Prior Activity Level Limited Community (1-2x/wk): Mod I with cane  Prior Functional Level Self Care: Did the patient need help bathing, dressing, using the toilet or eating?  Independent  Indoor Mobility: Did the patient need assistance with walking from room to room (with or without device)? Independent  Stairs: Did the patient need assistance with internal or external stairs (with or without device)? Independent  Functional Cognition: Did the patient need help planning regular tasks such as shopping or remembering to take medications? Independent  Patient Information Are you of Hispanic, Latino/a,or Spanish origin?: A. No, not of Hispanic, Latino/a, or Spanish origin What is your race?: B. Black or African American Do you need or want an interpreter to communicate with a doctor or health care staff?: 0. No  Patient's Response To:  Health Literacy and Transportation Is the patient able to respond to health literacy and transportation needs?: Yes Health Literacy - How often do you need to have someone help you when you read instructions, pamphlets, or other written material from your doctor or pharmacy?: Never In the past 12 months, has lack of transportation kept you from medical appointments or from getting medications?: No In the past 12 months, has lack of transportation  kept you from meetings, work, or from getting things needed for daily living?: No  Home Assistive Devices / [place] Devices/Equipment: Radio producer (specify quad or straight) Home Equipment: Cane - quad, Transport planner  Prior Device Use: Indicate devices/aids used by the patient prior to current illness, exacerbation or injury?  cane  Current Functional Level Cognition  Overall Cognitive Status: No family/caregiver present to determine baseline cognitive functioning Current Attention Level: Sustained Orientation Level: Oriented X4 Following Commands: Follows one step commands with increased time Safety/Judgement: Decreased awareness of safety General Comments: Pt stating he had been thinking about how he was going get to the chair all day, allowed pt to attempt, but unsuccessful using bed rail with HOB up.    Extremity Assessment (includes Sensation/Coordination)  Upper Extremity Assessment: LUE deficits/detail LUE Deficits / Details: residual weakness from prior stroke, spasticity with decreased elbow extension and shoulder movement LUE Coordination: decreased fine motor, decreased gross motor  Lower Extremity Assessment: Defer to PT evaluation RLE Deficits / Details: Hip flexion, knee extension, knee flexion 4/5 LLE Deficits / Details: decreased AROM and strength grossly    ADLs  Overall ADL's : Needs assistance/impaired Eating/Feeding: Set up, Bed level Grooming: Set up, Bed level Upper Body Bathing: Minimal assistance, Sitting Lower Body Bathing: Total assistance, Sit to/from stand, +2 for physical assistance Upper Body Dressing : Minimal assistance, Sitting Lower Body Dressing: +2 for physical assistance, Total assistance, Sit to/from stand Toileting- Clothing Manipulation and Hygiene: Total assistance, Bed level Functional mobility during ADLs: Min guard    Mobility  Overal bed mobility: Needs Assistance Bed Mobility: Supine to Sit, Sit to Supine Supine to  sit: +2 for physical assistance, Mod assist Sit to supine: +2 for physical assistance, Mod assist General bed mobility comments: heavy reliance on rail, HOB up, assist for LEs over EOB and to raise trunk, guided upper body and assisted LEs back into bed    Transfers  Overall transfer level: Needs assistance Equipment used: Rolling walker (2 wheels) Transfers: Sit to/from Stand Sit to Stand: +2 physical assistance, Max assist, From elevated surface General transfer comment: Attempted to stand multiple times with different techniques. Cues for hand placement with RW, assist to rise, unable to fully stand. Stand pivot to recliner deferred due to pt needing to have a BM.    Ambulation / Gait / Stairs / Wheelchair Mobility  Ambulation/Gait Ambulation/Gait assistance: Min guard, Herbalist (Feet): 48  Feet Assistive device: Rolling walker (2 wheels) Gait Pattern/deviations: Step-to pattern, Decreased weight shift to left General Gait Details: ambulated to bathroom and back to recliner; one LOB requring min A to correct Gait velocity: decreased    Posture / Balance Balance Overall balance assessment: Needs assistance Sitting-balance support: Feet supported, No upper extremity supported Sitting balance-Leahy Scale: Fair Standing balance support: Bilateral upper extremity supported Standing balance-Leahy Scale: Poor Standing balance comment: +2 assist and RW for partial stand    Special needs/care consideration Wound VAC  Registered Nurse WOC Consult Note     Signed Date of Service:  02/23/2021  9:05 AM   Signed      Show:Clear all [x] Written[x] Templated[x] Copied  Added by: [x] Salley Slaughter, RN  [] Hover for details                                  WOC Nurse Consult Note: Patient receiving care in [place], PA-C present for first vac change.  Reason for Consult: Wound vac dressing change Wound type: Surgical LLE  Pressure Injury POA:  NA Measurement: 11 cm x 12 cm Wound bed: Beefy red with myriad skin substitute Drainage (amount, consistency, odor) Serosanguinous in canister Periwound: Chronic venous disease with hardened LE skin Dressing procedure/placement/frequency: Orders placed as follows: Leave green myriad skin substitute in place. Apply adaptic over substitute followed by, hydrogel, then black foam Today we were not able to save 100% of the Myriad skin substitute due to sticking to the foam dressing. We then applied Curad non-stick oil emulsion dressing (comparable to adaptic) then applied a coat of gel before applying the black foam. The drape had to be wrapped around the leg due to the hardened skin making it very difficult for the drape to stick. Attempted skin barrier wipes on the surrounding skin but the drape still wound not stick. Immediate seal was obtained at 125 mmHg. The track pad was placed up and an ace wrap was applied around the tubing to prevent pressure on the leg or the patient tripping over the tubing. Needs to be premedicated prior to change.   WOC will follow MWF. Please re-consult for any other needs.    Cathlean Marseilles Tamala Julian, MSN, RN, [place], Lysle Pearl, [facility] Wound Treatment Associate Pager [phone]          Note Details  Author Salley Slaughter, RN File Time 02/23/2021  9:25 AM  Author Type Registered Nurse Status Signed  Last Editor Salley Slaughter, RN Service [place] # [id number] Admit Date 02/14/2021   Previous Home Environment  Living Arrangements: Alone  Lives With: Alone Available Help at Discharge:  (wife to assist; they have been separated) Type of Home: House Home Layout: One level Home Access: Level entry Bathroom Shower/Tub: Chiropodist: Standard Bathroom Accessibility: Yes Home Care Services: No Additional Comments:  (separated from wife for 20 years, but she will asisst at d/c)  Discharge Living Setting Plans for Discharge Living Setting:  Patient's home, Alone Type of Home at Discharge: House Discharge Home Layout: One level Discharge Home Access: Level entry Discharge Bathroom Shower/Tub: [place] unit Discharge Bathroom Toilet: Standard Discharge [place] Accessibility: Yes How Accessible: Accessible via walker (wheelchair accessible home) Does the patient have any problems obtaining your medications?: No  Social/Family/Support Systems Contact Information: spouse, Deneise Lever. They have been seperated for 20 years, but she and their 2 daughters to asist Anticipated Caregiver: Deneise Lever Anticipated Caregiver's  Contact Information: see contacts Ability/Limitations of Caregiver: none Caregiver Availability: Intermittent Discharge Plan Discussed with Primary Caregiver: Yes Is Caregiver In Agreement with Plan?: Yes Does Caregiver/Family have Issues with Lodging/Transportation while Pt is in Rehab?: No  Goals Patient/Family Goal for Rehab: supervision to min assist with PT and OT Expected length of stay: ELOS 10 to 12 days Pt/Family Agrees to Admission and willing to participate: Yes Program Orientation Provided & Reviewed with Pt/Caregiver Including Roles  & Responsibilities: Yes  Decrease burden of Care through IP rehab admission: n/a  Possible need for SNF placement upon discharge: not anticipated  Patient Condition: I have reviewed medical records from [facility], spoken with CM, and patient and spouse. I met with patient at the bedside for inpatient rehabilitation assessment.  Patient will benefit from ongoing PT and OT, can actively participate in 3 hours of therapy a day 5 days of the week, and can make measurable gains during the admission.  Patient will also benefit from the coordinated team approach during an Inpatient Acute Rehabilitation admission.  The patient will receive intensive therapy as well as Rehabilitation physician, nursing, social worker, and care management interventions.  Due to bladder  management, bowel management, safety, skin/wound care, disease management, medication administration, pain management, and patient education the patient requires 24 hour a day rehabilitation nursing.  The patient is currently mod to max assist overall with mobility and basic ADLs.  Discharge setting and therapy post discharge at home with home health is anticipated.  Patient has agreed to participate in the Acute Inpatient Rehabilitation Program and will admit today.  Preadmission Screen Completed By:  Cleatrice Burke, 02/23/2021 11:10 AM ______________________________________________________________________   Discussed status with Dr. Naaman Plummer on  02/23/2021 at 28 and received approval for admission today.  Admission Coordinator:  Cleatrice Burke, RN, time  3009 Date  02/23/2021   Assessment/Plan: Diagnosis: Left TMA Does the need for close, 24 hr/day Medical supervision in concert with the patient's rehab needs make it unreasonable for this patient to be served in a less intensive setting? Yes Co-Morbidities requiring supervision/potential complications: hx of cva, dm, obesity Due to bladder management, bowel management, safety, skin/wound care, disease management, medication administration, pain management, and patient education, does the patient require 24 hr/day rehab nursing? Yes Does the patient require coordinated care of a physician, rehab nurse, PT, OT to address physical and functional deficits in the context of the above medical diagnosis(es)? Yes Addressing deficits in the following areas: balance, endurance, locomotion, strength, transferring, bowel/bladder control, bathing, dressing, feeding, grooming, toileting, and psychosocial support Can the patient actively participate in an intensive therapy program of at least 3 hrs of therapy 5 days a week? Yes The potential for patient to make measurable gains while on inpatient rehab is excellent Anticipated functional  outcomes upon discharge from inpatient rehab: supervision and min assist PT, supervision and min assist OT, n/a SLP Estimated rehab length of stay to reach the above functional goals is: 10-12 days Anticipated discharge destination: Home 10. Overall Rehab/Functional Prognosis: excellent   MD Signature: Meredith Staggers, MD, [place] Director Rehabilitation Services 02/23/2021

## 2021-02-22 NOTE — H&P (Signed)
Physical Medicine and Rehabilitation Admission H&P    Chief Complaint  Patient presents with   Knee Pain  : HPI: Einar L. Goyer is a 65 year old right-handed male with history of hypertension, CVA with left-sided residual weakness maintained on aspirin and Plavix, diabetes mellitus, obesity with BMI 37.74.  Per chart review patient currently separated from his wife lives alone with plans to stay with his wife on discharge.  1 level home with level entry.  Modified independent prior to admission.  Presented 02/14/2021 with left side hip pain and malodorous wounds in the lower extremities including the toes of the left foot.  Reports the wound has been progressing over the last 2 months and is followed at the [facility] in [place].  Admission chemistry potassium 5.2 glucose 122 BUN 26 creatinine 1.31, hemoglobin 10.7, lactic acid 2.5, blood cultures no growth to date, sedimentation rate 76.  MRI of the left foot showed marked subcutaneous soft tissue swelling edema fluid consistent with cellulitis.  Osteomyelitis involving the entire second toe and distal phalanx of the third toe.  MRI of the left hip mild bilateral hip degenerative changes but no stress fracture or AVN.  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 Jamelle Haring and wound VAC applied.  Partial weightbearing left lower extremity through heel only with Darco shoe.  Maintained on Lovenox for DVT prophylaxis.  His chronic aspirin and Plavix have been resumed.  Acute blood loss anemia 8.5 and monitored.  Therapy evaluations completed due to patient decreased functional mobility was admitted for a comprehensive rehab program.  Review of Systems  Constitutional:  Positive for fever. Negative for chills.  HENT:  Negative for hearing loss.   Eyes:  Negative for blurred vision and double vision.  Respiratory:  Negative for cough and shortness of breath.    Cardiovascular:  Positive for leg swelling. Negative for chest pain and palpitations.  Gastrointestinal:  Positive for constipation. Negative for heartburn, nausea and vomiting.  Genitourinary:  Negative for dysuria, flank pain and hematuria.  Musculoskeletal:  Positive for joint pain and myalgias.  Skin:  Negative for rash.  Psychiatric/Behavioral:  Positive for depression.   All other systems reviewed and are negative. Past Medical History:  Diagnosis Date   Diabetes mellitus without complication ([place])    Hemiparesis ([place])    left side; residual from prior acute ischemic CVA   HTN (hypertension)    Ischemic cerebrovascular accident (CVA) ([place])    with residual left hemiparesis   Past Surgical History:  Procedure Laterality Date   ANGIOPLASTY Left 02/19/2021   Procedure: POPLITEAL ANGIOPLASTY;  Surgeon: Cherre Robins, MD;  Location: [place];  Service: Vascular;  Laterality: Left;   LOWER EXTREMITY ANGIOGRAM Left 02/19/2021   Procedure: LEFT LOWER EXTREMITY ANGIOGRAM;  Surgeon: Cherre Robins, MD;  Location: [place];  Service: Vascular;  Laterality: Left;   LOWER EXTREMITY ANGIOGRAPHY N/A 02/15/2021   Procedure: LOWER EXTREMITY ANGIOGRAPHY;  Surgeon: Cherre Robins, MD;  Location: [place] [place] [facility];  Service: Cardiovascular;  Laterality: N/A;   TRANSMETATARSAL AMPUTATION Left 02/19/2021   Procedure: LEFT TRANSMETATARSAL AMPUTATION;  Surgeon: Cherre Robins, MD;  Location: [place];  Service: Vascular;  Laterality: Left;   ULTRASOUND GUIDANCE FOR VASCULAR ACCESS Right 02/19/2021   Procedure: ULTRASOUND GUIDANCE FOR RIGHT COMMON FEMORAL ARTERY ACCESS;  Surgeon: Cherre Robins, MD;  Location: [place];  Service: Vascular;  Laterality: Right;   WOUND DEBRIDEMENT Left 02/19/2021  Procedure: LEFT CALF VENOUS ULCER SHARP EXCISIONAL DEBRIDEMENT;  Surgeon: Cherre Robins, MD;  Location: [place] [facility];  Service: Vascular;  Laterality: Left;   Family History  Problem Relation Age of Onset    Diabetes Brother    Diabetes Brother    Social History:  reports that he has never smoked. He has never used smokeless tobacco. He reports that he does not drink alcohol and does not use drugs. Allergies: Not on File Medications Prior to Admission  Medication Sig Dispense Refill   acetaminophen (TYLENOL) 325 MG tablet Take 2 tablets (650 mg total) by mouth every 6 (six) hours as needed for mild pain (or Fever >/= 101). 30 tablet 3   amLODipine (NORVASC) 10 MG tablet Take 1 tablet (10 mg total) by mouth daily. 30 tablet 5   aspirin EC 81 MG tablet Take 1 tablet (81 mg total) by mouth daily with breakfast. 120 tablet 3   atorvastatin (LIPITOR) 40 MG tablet Take 1 tablet (40 mg total) by mouth daily. 30 tablet 5   baclofen (LIORESAL) 10 MG tablet Take 30 mg by mouth daily.     clopidogrel (PLAVIX) 75 MG tablet Take 1 tablet (75 mg total) by mouth daily. 30 tablet 5   glimepiride (AMARYL) 2 MG tablet Take 1 tablet (2 mg total) by mouth 2 (two) times daily with a meal. 60 tablet 5   insulin glargine (LANTUS) 100 UNIT/ML Solostar Pen Inject 10 Units into the skin at bedtime. 15 mL 11   lisinopril-hydrochlorothiazide (ZESTORETIC) 20-25 MG tablet Take 1 tablet by mouth daily.     lurasidone (LATUDA) 20 MG TABS tablet Take 1 tablet (20 mg total) by mouth daily. 60 tablet 3   metFORMIN (GLUCOPHAGE) 500 MG tablet Take 500 mg by mouth 2 (two) times daily with a meal.     sertraline (ZOLOFT) 100 MG tablet Take 1.5 tablets (150 mg total) by mouth daily. 45 tablet 5   traZODone (DESYREL) 100 MG tablet TAKE TWO TABLETS BY MOUTH AT BEDTIME FOR SLEEP. TAKE AT 8PM.     blood glucose meter kit and supplies Relion Prime or Dispense other brand based on patient and insurance preference. Use up to four times daily as directed. (FOR ICD-9 250.00, 250.01). 1 each 3   blood glucose meter kit and supplies Dispense based on patient and insurance preference. Use up to four times daily as directed. (FOR ICD-9 250.00,  250.01). 1 each 1   Insulin Pen Needle (PEN NEEDLES 3/16") 31G X 5 MM MISC 1 Units by Does not apply route at bedtime. Use it to administer insulin 100 each 5   lisinopril (ZESTRIL) 20 MG tablet Take 1 tablet (20 mg total) by mouth daily. (Patient not taking: Reported on 02/14/2021) 30 tablet 3   metFORMIN (GLUCOPHAGE) 1000 MG tablet Take 1 tablet (1,000 mg total) by mouth 2 (two) times daily with a meal. 60 tablet 11   polyethylene glycol powder (GLYCOLAX/MIRALAX) 17 GM/SCOOP powder Take by mouth. (Patient not taking: Reported on 02/14/2021)     prazosin (MINIPRESS) 1 MG capsule Take 1 capsule by mouth at bedtime. (Patient not taking: Reported on 02/14/2021)     sertraline (ZOLOFT) 100 MG tablet TAKE ONE TABLET BY MOUTH DAILY FOR MENTAL HEALTH. NOTE INCREASE IN DOSE (Patient not taking: Reported on 02/14/2021)      Drug Regimen Review Drug regimen was reviewed and remains appropriate with no significant issues identified  Home: Home Living Family/patient expects to be discharged to:: Private residence Living  Arrangements: Alone Available Help at Discharge:  (wife to assist; they have been separated) Type of Home: House Home Access: Level entry Home Layout: One level Bathroom Shower/Tub: Chiropodist: Standard Bathroom Accessibility: Yes Home Equipment: Cane - quad, Transport planner Additional Comments:  (separated from wife for 20 years, but she will asisst at d/c)  Lives With: Alone   Functional History: Prior Function Prior Level of Function : Independent/Modified Independent, Driving Mobility Comments: Ambulated in home and community with quad cane ADLs Comments: Independent in ADL, IADL, driving  Functional Status:  Mobility: Bed Mobility Overal bed mobility: Needs Assistance Bed Mobility: Supine to Sit, Sit to Supine Supine to sit: +2 for physical assistance, Mod assist Sit to supine: +2 for physical assistance, Mod assist General bed mobility comments:  heavy reliance on rail, HOB up, assist for LEs over EOB and to raise trunk, guided upper body and assisted LEs back into bed Transfers Overall transfer level: Needs assistance Equipment used: Rolling walker (2 wheels) Transfers: Sit to/from Stand Sit to Stand: +2 physical assistance, Max assist, From elevated surface General transfer comment: Attempted to stand multiple times with different techniques. Cues for hand placement with RW, assist to rise, unable to fully stand. Stand pivot to recliner deferred due to pt needing to have a BM. Ambulation/Gait Ambulation/Gait assistance: Min guard, Min assist Gait Distance (Feet): 48 Feet Assistive device: Rolling walker (2 wheels) Gait Pattern/deviations: Step-to pattern, Decreased weight shift to left General Gait Details: ambulated to bathroom and back to recliner; one LOB requring min A to correct Gait velocity: decreased    ADL: ADL Overall ADL's : Needs assistance/impaired Eating/Feeding: Set up, Bed level Grooming: Set up, Bed level Upper Body Bathing: Minimal assistance, Sitting Lower Body Bathing: Total assistance, Sit to/from stand, +2 for physical assistance Upper Body Dressing : Minimal assistance, Sitting Lower Body Dressing: +2 for physical assistance, Total assistance, Sit to/from stand Toileting- Clothing Manipulation and Hygiene: Total assistance, Bed level Functional mobility during ADLs: Min guard  Cognition: Cognition Overall Cognitive Status: No family/caregiver present to determine baseline cognitive functioning Orientation Level: Oriented X4 Cognition Arousal/Alertness: Awake/alert Behavior During Therapy: WFL for tasks assessed/performed, Anxious Overall Cognitive Status: No family/caregiver present to determine baseline cognitive functioning Area of Impairment: Safety/judgement, Problem solving, Awareness, Following commands Current Attention Level: Sustained Following Commands: Follows one step commands with  increased time Safety/Judgement: Decreased awareness of safety Awareness: Emergent Problem Solving: Slow processing, Difficulty sequencing, Requires verbal cues General Comments: Pt stating he had been thinking about how he was going get to the chair all day, allowed pt to attempt, but unsuccessful using bed rail with HOB up.  Physical Exam: Blood pressure 124/84, pulse 86, temperature 97.9 F (36.6 C), temperature source Oral, resp. rate 18, height _0  (1.702 m), weight 109.3 kg, SpO2 97 %. Physical Exam Constitutional:      General: He is not in acute distress.    Appearance: He is obese.  HENT:     Head: Normocephalic and atraumatic.     Right Ear: External ear normal.     Left Ear: External ear normal.     Mouth/Throat:     Mouth: Mucous membranes are moist.     Pharynx: Oropharynx is clear.  Eyes:     Extraocular Movements: Extraocular movements intact.     Pupils: Pupils are equal, round, and reactive to light.  Cardiovascular:     Rate and Rhythm: Normal rate and regular rhythm.     Heart sounds: No  murmur heard.   No gallop.  Pulmonary:     Effort: Pulmonary effort is normal. No respiratory distress.     Breath sounds: No wheezing.  Abdominal:     General: Bowel sounds are normal. There is no distension.     Palpations: Abdomen is soft.     Tenderness: There is no abdominal tenderness.  Genitourinary:    Comments: Has purewick in place Musculoskeletal:        General: Normal range of motion.     Cervical back: Normal range of motion. No rigidity.  Skin:    Comments: Left lower extremity TMA site dressed with full Ace wrap through bypass site.  VAC in place. very tender to palpation. RLE very sensitive to touch, skin very dry, no breakdown  Neurological:     Comments: Patient is alert oriented x3 and follows commands. Functional memory, concentration.RUE and LUE grossly 4+/5. LLE with flexor tone and limited testing d/t pain/dressing distal limb. RUE 4 to 4+/5 prox  to distal. ? Mild sensory loss distally RLE  Psychiatric:        Mood and Affect: Mood normal.        Behavior: Behavior normal.    Results for orders placed or performed during the hospital encounter of 02/14/21 (from the past 48 hour(s))  Glucose, capillary     Status: Abnormal   Collection Time: 02/21/21  6:08 AM  Result Value Ref Range   Glucose-Capillary 108 (H) 70 - 99 mg/dL    Comment: Glucose reference range applies only to samples taken after fasting for at least 8 hours.  Creatinine, serum     Status: None   Collection Time: 02/21/21  6:32 AM  Result Value Ref Range   Creatinine, Ser 1.03 0.61 - 1.24 mg/dL   GFR, Estimated >60 >60 mL/min    Comment: (NOTE) Calculated using the CKD-EPI Creatinine Equation (2021) Performed at [facility] [address], [place], [place] [address]   Renal function panel     Status: Abnormal   Collection Time: 02/21/21  6:32 AM  Result Value Ref Range   Sodium 136 135 - 145 mmol/L   Potassium 3.5 3.5 - 5.1 mmol/L   Chloride 106 98 - 111 mmol/L   CO2 23 22 - 32 mmol/L   Glucose, Bld 109 (H) 70 - 99 mg/dL    Comment: Glucose reference range applies only to samples taken after fasting for at least 8 hours.   BUN 14 8 - 23 mg/dL   Creatinine, Ser 1.04 0.61 - 1.24 mg/dL   Calcium 8.4 (L) 8.9 - 10.3 mg/dL   Phosphorus 3.1 2.5 - 4.6 mg/dL   Albumin 2.5 (L) 3.5 - 5.0 g/dL   GFR, Estimated >60 >60 mL/min    Comment: (NOTE) Calculated using the CKD-EPI Creatinine Equation (2021)    Anion gap 7 5 - 15    Comment: Performed at [facility] [address], [place], [place] [address]  Magnesium     Status: None   Collection Time: 02/21/21  6:32 AM  Result Value Ref Range   Magnesium 2.2 1.7 - 2.4 mg/dL    Comment: Performed at [facility] [address], [place] [address]  CBC     Status: Abnormal   Collection Time: 02/21/21  6:32 AM  Result Value Ref Range   WBC 10.1 4.0 - 10.5 K/uL   RBC 2.91 (L) 4.22 -  5.81 MIL/uL   Hemoglobin 8.4 (L)  13.0 - 17.0 g/dL   HCT 25.1 (L) 39.0 - 52.0 %   MCV 86.3 80.0 - 100.0 fL   MCH 28.9 26.0 - 34.0 pg   MCHC 33.5 30.0 - 36.0 g/dL   RDW 15.8 (H) 11.5 - 15.5 %   Platelets 349 150 - 400 K/uL   nRBC 0.0 0.0 - 0.2 %    Comment: Performed at [facility] [address], [place], [place] [address]  Glucose, capillary     Status: Abnormal   Collection Time: 02/21/21 11:20 AM  Result Value Ref Range   Glucose-Capillary 110 (H) 70 - 99 mg/dL    Comment: Glucose reference range applies only to samples taken after fasting for at least 8 hours.  Glucose, capillary     Status: Abnormal   Collection Time: 02/21/21  4:27 PM  Result Value Ref Range   Glucose-Capillary 132 (H) 70 - 99 mg/dL    Comment: Glucose reference range applies only to samples taken after fasting for at least 8 hours.  Glucose, capillary     Status: Abnormal   Collection Time: 02/21/21  9:27 PM  Result Value Ref Range   Glucose-Capillary 153 (H) 70 - 99 mg/dL    Comment: Glucose reference range applies only to samples taken after fasting for at least 8 hours.   Comment 1 Notify RN    Comment 2 Document in Chart   Glucose, capillary     Status: Abnormal   Collection Time: 02/22/21  7:01 AM  Result Value Ref Range   Glucose-Capillary 110 (H) 70 - 99 mg/dL    Comment: Glucose reference range applies only to samples taken after fasting for at least 8 hours.  CBC with Differential/Platelet     Status: Abnormal   Collection Time: 02/22/21  8:20 AM  Result Value Ref Range   WBC 9.6 4.0 - 10.5 K/uL   RBC 2.89 (L) 4.22 - 5.81 MIL/uL   Hemoglobin 8.5 (L) 13.0 - 17.0 g/dL   HCT 24.6 (L) 39.0 - 52.0 %   MCV 85.1 80.0 - 100.0 fL   MCH 29.4 26.0 - 34.0 pg   MCHC 34.6 30.0 - 36.0 g/dL   RDW 15.8 (H) 11.5 - 15.5 %   Platelets 329 150 - 400 K/uL   nRBC 0.0 0.0 - 0.2 %   Neutrophils Relative % 74 %   Neutro Abs 7.0 1.7 - 7.7 K/uL   Lymphocytes Relative 14 %   Lymphs Abs 1.3 0.7 - 4.0 K/uL    Monocytes Relative 11 %   Monocytes Absolute 1.1 (H) 0.1 - 1.0 K/uL   Eosinophils Relative 1 %   Eosinophils Absolute 0.1 0.0 - 0.5 K/uL   Basophils Relative 0 %   Basophils Absolute 0.0 0.0 - 0.1 K/uL   Immature Granulocytes 0 %   Abs Immature Granulocytes 0.03 0.00 - 0.07 K/uL    Comment: Performed at [facility], [address], [place], [place] [address]  Basic metabolic panel     Status: Abnormal   Collection Time: 02/22/21  8:20 AM  Result Value Ref Range   Sodium 140 135 - 145 mmol/L   Potassium 3.8 3.5 - 5.1 mmol/L   Chloride 110 98 - 111 mmol/L   CO2 24 22 - 32 mmol/L   Glucose, Bld 119 (H) 70 - 99 mg/dL    Comment: Glucose reference range applies only to samples taken after fasting for at least 8 hours.   BUN 13 8 -  23 mg/dL   Creatinine, Ser 0.87 0.61 - 1.24 mg/dL   Calcium 8.6 (L) 8.9 - 10.3 mg/dL   GFR, Estimated >60 >60 mL/min    Comment: (NOTE) Calculated using the CKD-EPI Creatinine Equation (2021)    Anion gap 6 5 - 15    Comment: Performed at [facility] [address] [place], [place] [address]  Glucose, capillary     Status: Abnormal   Collection Time: 02/22/21 11:50 AM  Result Value Ref Range   Glucose-Capillary 125 (H) 70 - 99 mg/dL    Comment: Glucose reference range applies only to samples taken after fasting for at least 8 hours.  Glucose, capillary     Status: Abnormal   Collection Time: 02/22/21  4:11 PM  Result Value Ref Range   Glucose-Capillary 144 (H) 70 - 99 mg/dL    Comment: Glucose reference range applies only to samples taken after fasting for at least 8 hours.  Glucose, capillary     Status: Abnormal   Collection Time: 02/22/21  8:47 PM  Result Value Ref Range   Glucose-Capillary 116 (H) 70 - 99 mg/dL    Comment: Glucose reference range applies only to samples taken after fasting for at least 8 hours.   Comment 1 Notify RN    Comment 2 Document in Chart   Creatinine, serum     Status: None   Collection Time: 02/23/21   4:51 AM  Result Value Ref Range   Creatinine, Ser 0.96 0.61 - 1.24 mg/dL   GFR, Estimated >60 >60 mL/min    Comment: (NOTE) Calculated using the CKD-EPI Creatinine Equation (2021) Performed at [facility] [address], [place], [place] [address]    No results found.     Medical Problem List and Plan: 1. 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     -patient may shower if LLE is covered  -ELOS/Goals: 10-12 days, supervision to min assist goals with PT, OT 2.  Antithrombotics: -DVT/anticoagulation:  Pharmaceutical: Lovenox  -antiplatelet therapy: Aspirin 81 mg daily and Plavix 25 mg daily 3. Pain Management: Oxycodone/Robaxin as needed 4. Mood: Zoloft 150 mg daily, trazodone 100 mg nightly  -antipsychotic agents: Latuda 20 mg daily 5. Neuropsych: This patient is capable of making decisions on his own behalf. 6. Skin/Wound Care:  continue vac per surgery  -add eucerin cream for RLE 7. Fluids/Electrolytes/Nutrition: Routine in and outs with follow-up chemistries 8.  Acute blood loss anemia.  Follow-up CBC 9.  Diabetes mellitus.  Hemoglobin A1c 6.1.  Semglee 10 units nightly.  Check blood sugars before meals and at bedtime.  Diabetic teaching 10.  Hypertension.  Norvasc 10 mg daily.  Monitor with increased mobility 11.  Hyperlipidemia.  Lipitor 12.  History of CVA with residual left-sided weakness.  Continue aspirin and Plavix 13.  Morbid obesity.  BMI 37.74.  Dietary follow-up 14.  Constipation.  MiraLAX twice daily as needed, Senokot S1 tab twice daily as needed  -no BM since 12/11  -order sorbitol on admit    Cathlyn Parsons, PA-C 02/23/2021

## 2021-02-22 NOTE — Plan of Care (Signed)

## 2021-02-22 NOTE — Progress Notes (Signed)
Nutrition Follow-up  DOCUMENTATION CODES:   Not applicable  INTERVENTION:   Continue Prosource Plus 30 ml PO BID, each packet provides 100 kcal and 15 gm protein. Continue MVI with minerals daily. Add Glucerna Shake po TID, each supplement provides 220 kcal and 10 grams of protein. Recommend scheduled bowel regimen, no BM since 12/12.  NUTRITION DIAGNOSIS:   Increased nutrient needs related to wound healing (LLE wound) as evidenced by estimated needs.  Ongoing   GOAL:   Patient will meet greater than or equal to 90% of their needs  Progressing   MONITOR:   PO intake, Supplement acceptance, Labs, Skin  REASON FOR ASSESSMENT:   Consult Wound healing  ASSESSMENT:   65 y.o. male presented to the ED w/ L hip pain and malodorous wounds on LLE. PMH includes T2DM, HTN, and PVD. Pt admitted with infected LLE wound.  S/P left popliteal angioplasty, transmetatarsal amputation, venous ulcer debridement, and application of skin substitute on 12/12.  VAC in place to L leg. Patient reports poor intake of meals r/t no BM since surgery Monday. He says he will eat better once he has a BM. RN gave him a Senokot-S tablet this morning.  Encouraged increased protein intake to support healing. Patient agreed to try PO supplements to increase intake.   Labs reviewed.  CBG:110-125  Medications reviewed and include Novolog, Semglee, MVI with minerals.   Diet Order:   Diet Order             Diet regular Room service appropriate? Yes; Fluid consistency: Thin  Diet effective now                   EDUCATION NEEDS:   Education needs have been addressed  Skin:  Skin Assessment: Skin Integrity Issues: Skin Integrity Issues:: Wound VAC Wound Vac: LLE  Last BM:  12/11  Height:   Ht Readings from Last 1 Encounters:  02/15/21 5\' 7"  (1.702 m)    Weight:   Wt Readings from Last 1 Encounters:  02/15/21 109.3 kg    Ideal Body Weight:  67.3 kg  BMI:  Body mass index is  37.74 kg/m.  Estimated Nutritional Needs:   Kcal:  2100-2300  Protein:  110-130 gm  Fluid:  2.1-2.3 L    14/08/22, RD, LDN, CNSC Please refer to Amion for contact information.

## 2021-02-23 ENCOUNTER — Other Ambulatory Visit: Payer: Self-pay

## 2021-02-23 ENCOUNTER — Inpatient Hospital Stay (HOSPITAL_COMMUNITY)
Admission: RE | Admit: 2021-02-23 | Discharge: 2021-03-07 | DRG: 560 | Disposition: A | Discharge status: Home Health | Payer: Medicare Other | Source: Intra-hospital | Attending: Physical Medicine and Rehabilitation | Admitting: Physical Medicine and Rehabilitation

## 2021-02-23 ENCOUNTER — Encounter (HOSPITAL_COMMUNITY): Payer: Self-pay | Admitting: Physical Medicine and Rehabilitation

## 2021-02-23 DIAGNOSIS — Z7982 Long term (current) use of aspirin: Secondary | ICD-10-CM | POA: Diagnosis not present

## 2021-02-23 DIAGNOSIS — K5900 Constipation, unspecified: Secondary | ICD-10-CM | POA: Diagnosis present

## 2021-02-23 DIAGNOSIS — Z794 Long term (current) use of insulin: Secondary | ICD-10-CM

## 2021-02-23 DIAGNOSIS — G8918 Other acute postprocedural pain: Secondary | ICD-10-CM

## 2021-02-23 DIAGNOSIS — Z79899 Other long term (current) drug therapy: Secondary | ICD-10-CM | POA: Diagnosis not present

## 2021-02-23 DIAGNOSIS — Z7902 Long term (current) use of antithrombotics/antiplatelets: Secondary | ICD-10-CM

## 2021-02-23 DIAGNOSIS — K5901 Slow transit constipation: Secondary | ICD-10-CM | POA: Diagnosis not present

## 2021-02-23 DIAGNOSIS — E785 Hyperlipidemia, unspecified: Secondary | ICD-10-CM | POA: Diagnosis present

## 2021-02-23 DIAGNOSIS — I70242 Atherosclerosis of native arteries of left leg with ulceration of calf: Secondary | ICD-10-CM

## 2021-02-23 DIAGNOSIS — Z89432 Acquired absence of left foot: Secondary | ICD-10-CM | POA: Diagnosis not present

## 2021-02-23 DIAGNOSIS — E1151 Type 2 diabetes mellitus with diabetic peripheral angiopathy without gangrene: Secondary | ICD-10-CM | POA: Diagnosis present

## 2021-02-23 DIAGNOSIS — Z9889 Other specified postprocedural states: Secondary | ICD-10-CM

## 2021-02-23 DIAGNOSIS — Z6837 Body mass index (BMI) 37.0-37.9, adult: Secondary | ICD-10-CM

## 2021-02-23 DIAGNOSIS — Z4781 Encounter for orthopedic aftercare following surgical amputation: Principal | ICD-10-CM

## 2021-02-23 DIAGNOSIS — I739 Peripheral vascular disease, unspecified: Secondary | ICD-10-CM

## 2021-02-23 DIAGNOSIS — I10 Essential (primary) hypertension: Secondary | ICD-10-CM | POA: Diagnosis present

## 2021-02-23 DIAGNOSIS — I69354 Hemiplegia and hemiparesis following cerebral infarction affecting left non-dominant side: Secondary | ICD-10-CM

## 2021-02-23 DIAGNOSIS — L97529 Non-pressure chronic ulcer of other part of left foot with unspecified severity: Secondary | ICD-10-CM

## 2021-02-23 DIAGNOSIS — E669 Obesity, unspecified: Secondary | ICD-10-CM

## 2021-02-23 DIAGNOSIS — Z7984 Long term (current) use of oral hypoglycemic drugs: Secondary | ICD-10-CM

## 2021-02-23 DIAGNOSIS — I70245 Atherosclerosis of native arteries of left leg with ulceration of other part of foot: Secondary | ICD-10-CM

## 2021-02-23 DIAGNOSIS — D62 Acute posthemorrhagic anemia: Secondary | ICD-10-CM | POA: Diagnosis present

## 2021-02-23 DIAGNOSIS — E1169 Type 2 diabetes mellitus with other specified complication: Secondary | ICD-10-CM

## 2021-02-23 DIAGNOSIS — Z833 Family history of diabetes mellitus: Secondary | ICD-10-CM

## 2021-02-23 DIAGNOSIS — R7989 Other specified abnormal findings of blood chemistry: Secondary | ICD-10-CM | POA: Diagnosis not present

## 2021-02-23 DIAGNOSIS — E11621 Type 2 diabetes mellitus with foot ulcer: Secondary | ICD-10-CM

## 2021-02-23 LAB — GLUCOSE, CAPILLARY
Glucose-Capillary: 134 mg/dL — ABNORMAL HIGH (ref 70–99)
Glucose-Capillary: 124 mg/dL — ABNORMAL HIGH (ref 70–99)
Glucose-Capillary: 118 mg/dL — ABNORMAL HIGH (ref 70–99)
Glucose-Capillary: 147 mg/dL — ABNORMAL HIGH (ref 70–99)

## 2021-02-23 LAB — CREATININE, SERUM
Creatinine, Ser: 0.96 mg/dL (ref 0.61–1.24)
GFR, Estimated: >60 mL/min (ref >60)

## 2021-02-23 MED ORDER — ATORVASTATIN CALCIUM 40 MG PO TABS
40.0000 mg | ORAL_TABLET | Freq: Every day | ORAL | Status: DC
  Administered 2021-02-24 – 2021-03-07 (×12): 40 mg via ORAL
  Filled 2021-02-23 (×12): qty 1

## 2021-02-23 MED ORDER — CLOPIDOGREL BISULFATE 75 MG PO TABS
75.0000 mg | ORAL_TABLET | Freq: Every day | ORAL | Status: DC
  Administered 2021-02-24 – 2021-03-07 (×12): 75 mg via ORAL
  Filled 2021-02-23 (×12): qty 1

## 2021-02-23 MED ORDER — GLUCERNA SHAKE PO LIQD
237.0000 mL | Freq: Three times a day (TID) | ORAL | 0 refills | Status: DC
Start: 2021-02-23 — End: 2021-03-07

## 2021-02-23 MED ORDER — SENNOSIDES-DOCUSATE SODIUM 8.6-50 MG PO TABS: 1.0000 | ORAL_TABLET | Freq: Two times a day (BID) | ORAL | Status: DC | PRN

## 2021-02-23 MED ORDER — INSULIN ASPART 100 UNIT/ML IJ SOLN
0.0000 [IU] | Freq: Three times a day (TID) | INTRAMUSCULAR | Status: DC
  Administered 2021-02-25 – 2021-03-02 (×5): 2 [IU] via SUBCUTANEOUS
  Administered 2021-03-03: 18:00:00 0 [IU] via SUBCUTANEOUS
  Administered 2021-03-03: 13:00:00 2 [IU] via SUBCUTANEOUS

## 2021-02-23 MED ORDER — HYDROCERIN EX CREA
TOPICAL_CREAM | Freq: Two times a day (BID) | CUTANEOUS | Status: DC
  Administered 2021-02-25: 1 via TOPICAL
  Filled 2021-02-23: qty 113

## 2021-02-23 MED ORDER — LURASIDONE HCL 20 MG PO TABS
20.0000 mg | ORAL_TABLET | Freq: Every day | ORAL | Status: DC
  Administered 2021-02-24 – 2021-03-07 (×12): 20 mg via ORAL
  Filled 2021-02-23 (×12): qty 1

## 2021-02-23 MED ORDER — ENOXAPARIN SODIUM 60 MG/0.6ML IJ SOSY
55.0000 mg | PREFILLED_SYRINGE | Freq: Every day | INTRAMUSCULAR | Status: DC
  Administered 2021-02-23 – 2021-03-06 (×12): 55 mg via SUBCUTANEOUS
  Filled 2021-02-23 (×5): qty 0.55
  Filled 2021-02-23: qty 0.6
  Filled 2021-02-23 (×4): qty 0.55
  Filled 2021-02-23: qty 0.6
  Filled 2021-02-23 (×4): qty 0.55
  Filled 2021-02-23: qty 0.6
  Filled 2021-02-23: qty 0.55

## 2021-02-23 MED ORDER — TRAZODONE HCL 50 MG PO TABS
100.0000 mg | ORAL_TABLET | Freq: Every day | ORAL | Status: DC
  Administered 2021-02-23 – 2021-03-06 (×12): 100 mg via ORAL
  Filled 2021-02-23 (×13): qty 2

## 2021-02-23 MED ORDER — OXYCODONE-ACETAMINOPHEN 5-325 MG PO TABS: 1.0000 | ORAL_TABLET | ORAL | 0 refills | Status: DC | PRN

## 2021-02-23 MED ORDER — AMLODIPINE BESYLATE 10 MG PO TABS
10.0000 mg | ORAL_TABLET | Freq: Every day | ORAL | Status: DC
  Administered 2021-02-24 – 2021-03-07 (×12): 10 mg via ORAL
  Filled 2021-02-23 (×12): qty 1

## 2021-02-23 MED ORDER — ADULT MULTIVITAMIN W/MINERALS CH: 1.0000 | ORAL_TABLET | Freq: Every day | ORAL | Status: AC

## 2021-02-23 MED ORDER — PROSOURCE PLUS PO LIQD
30.0000 mL | Freq: Two times a day (BID) | ORAL | Status: DC
  Administered 2021-02-23 – 2021-03-07 (×24): 30 mL via ORAL
  Filled 2021-02-23 (×22): qty 30

## 2021-02-23 MED ORDER — GLUCERNA SHAKE PO LIQD: 237.0000 mL | Freq: Three times a day (TID) | ORAL | Status: DC

## 2021-02-23 MED ORDER — METHOCARBAMOL 500 MG PO TABS
500.0000 mg | ORAL_TABLET | Freq: Four times a day (QID) | ORAL | Status: DC | PRN
  Administered 2021-02-26: 12:00:00 500 mg via ORAL
  Filled 2021-02-23: qty 1

## 2021-02-23 MED ORDER — PROSOURCE PLUS PO LIQD: 30.0000 mL | Freq: Two times a day (BID) | ORAL | Status: DC

## 2021-02-23 MED ORDER — ENOXAPARIN SODIUM 60 MG/0.6ML IJ SOSY: 55.0000 mg | PREFILLED_SYRINGE | INTRAMUSCULAR | Status: DC

## 2021-02-23 MED ORDER — ADULT MULTIVITAMIN W/MINERALS CH
1.0000 | ORAL_TABLET | Freq: Every day | ORAL | Status: DC
  Administered 2021-02-24 – 2021-03-07 (×12): 1 via ORAL
  Filled 2021-02-23 (×12): qty 1

## 2021-02-23 MED ORDER — ASPIRIN EC 81 MG PO TBEC
81.0000 mg | DELAYED_RELEASE_TABLET | Freq: Every day | ORAL | Status: DC
  Administered 2021-02-24 – 2021-03-07 (×12): 81 mg via ORAL
  Filled 2021-02-23 (×12): qty 1

## 2021-02-23 MED ORDER — INSULIN GLARGINE-YFGN 100 UNIT/ML ~~LOC~~ SOLN
10.0000 [IU] | Freq: Every day | SUBCUTANEOUS | Status: DC
  Administered 2021-02-23 – 2021-03-06 (×12): 10 [IU] via SUBCUTANEOUS
  Filled 2021-02-23 (×14): qty 0.1

## 2021-02-23 MED ORDER — SORBITOL 70 % SOLN
60.0000 mL | Status: AC
  Administered 2021-02-23: 60 mL via ORAL
  Filled 2021-02-23: qty 60

## 2021-02-23 MED ORDER — OXYCODONE-ACETAMINOPHEN 5-325 MG PO TABS
1.0000 | ORAL_TABLET | ORAL | Status: DC | PRN
Start: 2021-02-23 — End: 2021-03-07
  Administered 2021-02-23 – 2021-02-28 (×11): 2 via ORAL
  Administered 2021-03-01: 12:00:00 1 via ORAL
  Administered 2021-03-02 – 2021-03-07 (×6): 2 via ORAL
  Filled 2021-02-23 (×16): qty 2
  Filled 2021-02-23: qty 1
  Filled 2021-02-23: qty 2
  Filled 2021-02-23 (×2): qty 1

## 2021-02-23 MED ORDER — POLYETHYLENE GLYCOL 3350 17 G PO PACK
17.0000 g | PACK | Freq: Two times a day (BID) | ORAL | Status: DC | PRN
  Administered 2021-03-01: 12:00:00 17 g via ORAL
  Filled 2021-02-23: qty 1

## 2021-02-23 MED ORDER — SERTRALINE HCL 50 MG PO TABS
150.0000 mg | ORAL_TABLET | Freq: Every day | ORAL | Status: DC
  Administered 2021-02-24 – 2021-03-07 (×12): 150 mg via ORAL
  Filled 2021-02-23 (×12): qty 3

## 2021-02-23 NOTE — Progress Notes (Signed)
INPATIENT REHABILITATION ADMISSION NOTE     Arrival Method: Bed, transferred from 4E      Mental Orientation: Alert and oriented x4     Assessment: Completed     Skin: Assessed with Ivin Booty, LPN     IV'S: 89Y in left wrist     Pain: states pain is a 3/10, discomfort but refused pain medication. RN educated patient about pain control and available PRN medications     Tubes and Drains: Negative pressure wound vac to left lateral calf     Safety Measures: Discussed with patient and implemented     Vital Signs: Completed     Height and Weight: 5'7" and 104.3 kg     Rehab Orientation: Completed     Family: Wife at bedside       Notes:  N/A

## 2021-02-23 NOTE — Discharge Summary (Signed)
Physician Discharge Summary  Adam Perez VEH:[id number] DOB: December 09, 1955 DOA: 02/14/2021  PCP: [facility]  Admit date: 02/14/2021 Discharge date: 02/23/2021  Admitted From: Home Disposition:  CIR  Recommendations for Outpatient Follow-up:  Follow up with PCP in 1-2 weeks Please obtain BMP/CBC in one week Please follow up on the Vascular and orthopedics as scheduled.   Discharge Condition: stable. CODE STATUS: full code.  Diet recommendation: Heart Healthy / Carb Modified   Brief/Interim Summary: 65 year old male with prior history of CVA, left-sided spastic hemiparesis, insulin-dependent diabetes mellitus, peripheral vascular disease, PTSD, GAD and, neurocognitive deficits presents with left hip pain, left foot wound.  He was found to have cellulitis and osteomyelitis of the left second and third toe as noted on the MRI. Patient was started on IV vancomycin, Rocephin and Flagyl.  Vascular surgery and orthopedic surgery consulted.  Patient underwent lower extremity angiography showing critical stenosis at the popliteal artery to TP trunk junction, occluded anterior and posterior tibial artery.  Patient underwent left lower extremity angiography, left popliteal angioplasty, left TMT and excisional debridement of left calf venous ulcer with a skin substitute to left calf wound on 02/19/2021.    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 S/p left lower extremity angiography, left popliteal angioplasty, left TMT and excisional debridement of left calf venous ulcer with a skin substitute to left calf wound on 02/19/2021. Completed the course of IV antibiotics.  Continue with Norco and Robaxin. Therapy eval recommending CIR.       Acute blood loss anemia secondary to the procedure. Anemia panel consistent with anemia of chronic disease. S/p 1 unit of PRBC transfusion.  Hemoglobin stable around 8.     Presyncope Possibly from vasovagal while straining. Patient currently denies any dizziness or chest pain.       Insulin-dependent diabetes mellitus peripheral vascular disease, hyperglycemia Last hemoglobin A1c around 6.1% CBG (last 3)  Recent Labs    02/22/21 1611 02/22/21 2047 02/23/21 0607  GLUCAP 144* 116* 134*   Resume home meds.      History of prior CVA with residual left-sided spastic hemiparesis Continue with aspirin, Plavix and statin.       Essential hypertension Blood pressure parameters are well controlled.        Body mass index is 37.74 kg/m. Morbid obesity the setting of diabetes peripheral vascular disease Increased mortality and morbidity at this time.         History of PTSD/anxiety and depression Continue with home medications.     Discharge Instructions  Discharge Instructions     Diet - low sodium heart healthy   Complete by: As directed    Discharge wound care:   Complete by: As directed    As per vascular surgery.   Increase activity slowly   Complete by: As directed       Allergies as of 02/23/2021   Not on File  Medication List     STOP taking these medications    lisinopril 20 MG tablet Commonly known as: ZESTRIL   prazosin 1 MG capsule Commonly known as: MINIPRESS       TAKE these medications    (feeding supplement) PROSource Plus liquid Take 30 mLs by mouth 2  (two) times daily with a meal.   feeding supplement (GLUCERNA SHAKE) Liqd Take 237 mLs by mouth 3 (three) times daily between meals.   acetaminophen 325 MG tablet Commonly known as: TYLENOL Take 2 tablets (650 mg total) by mouth every 6 (six) hours as needed for mild pain (or Fever >/= 101).   amLODipine 10 MG tablet Commonly known as: NORVASC Take 1 tablet (10 mg total) by mouth daily.   aspirin EC 81 MG tablet Take 1 tablet (81 mg total) by mouth daily with breakfast.   atorvastatin 40 MG tablet Commonly known as: LIPITOR Take 1 tablet (40 mg total) by mouth daily.   baclofen 10 MG tablet Commonly known as: LIORESAL Take 30 mg by mouth daily.   blood glucose meter kit and supplies Relion Prime or Dispense other brand based on patient and insurance preference. Use up to four times daily as directed. (FOR ICD-9 250.00, 250.01).   blood glucose meter kit and supplies Dispense based on patient and insurance preference. Use up to four times daily as directed. (FOR ICD-9 250.00, 250.01).   clopidogrel 75 MG tablet Commonly known as: PLAVIX Take 1 tablet (75 mg total) by mouth daily.   glimepiride 2 MG tablet Commonly known as: Amaryl Take 1 tablet (2 mg total) by mouth 2 (two) times daily with a meal.   insulin glargine 100 UNIT/ML Solostar Pen Commonly known as: LANTUS Inject 10 Units into the skin at bedtime.   lisinopril-hydrochlorothiazide 20-25 MG tablet Commonly known as: ZESTORETIC Take 1 tablet by mouth daily.   lurasidone 20 MG Tabs tablet Commonly known as: LATUDA Take 1 tablet (20 mg total) by mouth daily.   metFORMIN 500 MG tablet Commonly known as: GLUCOPHAGE Take 500 mg by mouth 2 (two) times daily with a meal. What changed: Another medication with the same name was removed. Continue taking this medication, and follow the directions you see here.   multivitamin with minerals Tabs tablet Take 1 tablet by mouth daily. Start taking on: February 24, 2021   oxyCODONE-acetaminophen 5-325 MG tablet Commonly known as: PERCOCET/ROXICET Take 1-2 tablets by mouth every 4 (four) hours as needed for moderate pain.   Pen Needles 3/16" 31G X 5 MM Misc 1 Units by Does not apply route at bedtime. Use it to administer insulin   polyethylene glycol powder 17 GM/SCOOP powder Commonly known as: GLYCOLAX/MIRALAX Take by mouth.   senna-docusate 8.6-50 MG tablet Commonly known as: Senokot-S Take 1 tablet by mouth 2 (two) times daily as needed for moderate constipation.   sertraline 100 MG tablet Commonly known as: ZOLOFT Take 1.5 tablets (150 mg total) by mouth daily. What changed: Another medication with the same name was removed. Continue taking this medication, and follow the directions you see here.   traZODone 100 MG tablet Commonly known as: DESYREL TAKE TWO TABLETS BY MOUTH AT BEDTIME FOR SLEEP. TAKE AT 8PM.               Discharge Care Instructions  (From admission, onward)           Start     Ordered   02/23/21 0000  Discharge wound care:  Comments: As per vascular surgery.   02/23/21 1045            Follow-up Information     Newt Minion, MD Follow up in 1 week(s).   Specialty: Orthopedic Surgery Contact information: [place] [address] [phone]         [facility] Follow up.   Why: 1-2 weeks. For wound re-check Contact information: [address] [place] Follow up in 2 week(s).   Specialty: General Practice Contact information: [place] [address] [phone]                Not on File  Consultations: Vascular surgery.     Procedures/Studies: DG Tibia/Fibula Left  Result Date: 02/14/2021 CLINICAL DATA:  pain, open wound lower leg, hx DM EXAM: LEFT TIBIA AND FIBULA - 2 VIEW COMPARISON:  None. FINDINGS: There is a soft tissue wound along the  lateral lower leg. Diffuse soft tissue swelling. There is mild periostitis along the lateral aspect of the distal fibula. No acute fracture. IMPRESSION: Diffuse lower leg soft tissue swelling with large lateral lower leg soft tissue wound. Adjacent mild periostitis of the lateral aspect of the distal fibula, concerning for possible developing bone infection. Electronically Signed   By: Maurine Simmering M.D.   On: 02/14/2021 14:36   MR TIBIA FIBULA LEFT WO CONTRAST  Result Date: 02/14/2021 CLINICAL DATA:  Open wounds left lower extremity. EXAM: MRI OF LOWER LEFT EXTREMITY WITHOUT CONTRAST TECHNIQUE: Multiplanar, multisequence MR imaging of the left lower extremity was performed. No intravenous contrast was administered. COMPARISON:  Radiographs, same date. FINDINGS: Examination limited by patient motion. There is diffuse and marked subcutaneous soft tissue swelling/edema/fluid suggesting cellulitis. There is a moderate-sized open wound noted along the lateral aspect of the lower leg but I do not see a discrete fluid collection to suggest a drainable abscess. Moderate fluid surrounding the calf muscle fascia. Edema like signal changes in the gastroc muscles bilaterally suggesting myositis. The soleus muscles are unremarkable. No findings suspicious for pyomyositis. The tibia and fibula are intact.  No findings for osteomyelitis. IMPRESSION: 1. Diffuse and marked subcutaneous soft tissue swelling/edema/fluid suggesting cellulitis. 2. Moderate-sized open wound along the lateral aspect of the lower leg but I do not see a discrete fluid collection to suggest a drainable abscess. 3. Edema like signal changes in the gastroc muscles bilaterally suggesting myositis. 4. No findings for pyomyositis or osteomyelitis. Electronically Signed   By: Marijo Sanes M.D.   On: 02/14/2021 18:54   MR FOOT LEFT WO CONTRAST  Result Date: 02/14/2021 CLINICAL DATA:  Foot pain and swelling. EXAM: MRI OF THE LEFT FOOT WITHOUT CONTRAST  TECHNIQUE: Multiplanar, multisequence MR imaging of the left foot was performed. No intravenous contrast was administered. COMPARISON:  Radiographs, same date. FINDINGS: Marked subcutaneous soft tissue swelling/edema/fluid consistent with cellulitis. Open wounds suspected involving the first, second and third toes dorsally. Abnormal T1 and T2 signal intensity involving the distal aspect of the second proximal phalanx, middle phalanx and the distal phalanx consistent with osteomyelitis. There is also abnormal signal intensity in the distal phalanx of the third toe consistent with osteomyelitis. No discrete drainable soft tissue abscess is identified. There is mild diffuse myositis but no findings suspicious for pyomyositis. The metatarsal and tarsal bones are intact. IMPRESSION: 1. Marked subcutaneous soft tissue swelling/edema/fluid consistent with cellulitis. 2. Osteomyelitis involving the entire second toe  and the distal phalanx of the third toe. 3. No discrete drainable soft tissue abscess. 4. Mild diffuse myositis but no findings suspicious for pyomyositis. Electronically Signed   By: Marijo Sanes M.D.   On: 02/14/2021 18:59   MR HIP LEFT WO CONTRAST  Result Date: 02/14/2021 CLINICAL DATA:  Left hip pain. EXAM: MR OF THE LEFT HIP WITHOUT CONTRAST TECHNIQUE: Multiplanar, multisequence MR imaging was performed. No intravenous contrast was administered. COMPARISON:  Radiographs, same date. FINDINGS: Both hips are normally located. Mild degenerative changes bilaterally. No stress fracture or AVN. No hip joint effusion. No periarticular fluid collections to suggest a paralabral cyst. Labral degenerative changes with suspected labral tears. The hip and pelvic musculature are unremarkable. No muscle tear, myositis or mass. No findings for peritendinitis or trochanteric bursitis. The hamstring tendons are intact. No significant intrapelvic abnormalities are identified. Mild prostate gland enlargement is noted.  Small bilateral inguinal lymph nodes but no mass or adenopathy. No inguinal hernia. IMPRESSION: 1. Mild bilateral hip joint degenerative changes but no stress fracture or AVN. 2. Labral degenerative changes with suspected labral tears. 3. Intact bony pelvis. 4. No significant intrapelvic abnormalities. Electronically Signed   By: Marijo Sanes M.D.   On: 02/14/2021 18:50   PERIPHERAL VASCULAR CATHETERIZATION  Result Date: 02/15/2021 DATE OF SERVICE: 02/15/2021  PATIENT:  Adam Perez  65 y.o. male  PRE-OPERATIVE DIAGNOSIS:  Atherosclerosis of native arteries of left lower extremity causing diabetic foot infection  POST-OPERATIVE DIAGNOSIS:  Same  PROCEDURE:  1) US guided right common femoral artery access 2) Aortogram 3) Left lower extremity angiogram with second order cannulation (67m total contrast) 4) Conscious sedation (37 minutes)  SURGEON:  TYevonne Aline HStanford Breed MD  ASSISTANT: none  ANESTHESIA:   local and IV sedation  ESTIMATED BLOOD LOSS: minimal  LOCAL MEDICATIONS USED:  LIDOCAINE  COUNTS: confirmed correct.  PATIENT DISPOSITION:  PACU - hemodynamically stable.  Delay start of Pharmacological VTE agent (>24hrs) due to surgical blood loss or risk of bleeding: no  INDICATION FOR PROCEDURE: DJACQUEZ SHEETZis a 65y.o. male with left diabetic foot infection in setting of atherosclerosis. After careful discussion of risks, benefits, and alternatives the patient was offered angiography. The patient understood and wished to proceed.  OPERATIVE FINDINGS: Terminal aorta and iliac arteries: Widely patent, tortuous, without flow limiting stenosis.  Left lower extremity: Common femoral artery: patent without flow limiting stenosis Profunda femoris artery: patent without flow limiting stenosis Superficial femoral artery: patent without flow limiting stenosis Popliteal artery: distal artery critically stenosed at junction with TP trunk Anterior tibial artery: occluded Tibioperoneal trunk: proximal artery critically  stenosed at junction with popliteal artery Peroneal artery: only calf vessel patent, flows to ankle and arborizes to pedal circulation Posterior tibial artery: occluded Pedal circulation: fills via collateral flow from peroneal artery  WIfI score 2/1/2 - clinical stage 4. Risk of amputation high. Benefit of revascularization moderate.  DESCRIPTION OF PROCEDURE: After identification of the patient in the pre-operative holding area, the patient was transferred to the operating room. The patient was positioned supine on the operating room table. Anesthesia was induced. The groins was prepped and draped in standard fashion. A surgical pause was performed confirming correct patient, procedure, and operative location.  The right groin was anesthetized with subcutaneous injection of 1% lidocaine. Using ultrasound guidance, the right common femoral artery was accessed with micropuncture technique. Fluoroscopy was used to confirm cannulation over the femoral head. The 92F sheath was upsized to 52F.  A BBritta Mccreedy  wire was advanced into the distal aorta. Over the wire an omni flush catheter was advanced to the level of L2. Aortogram was performed - see above for details.  The left common iliac artery was selected with a glidewire guidewire. The wire was advanced into the common femoral artery. Over the wire the omni flush catheter was advanced into the external iliac artery. Selective angiography was performed - see above for details.  A mynx device was used to close the arteriotomy. Hemostasis was excellent upon completion.  Conscious sedation was administered with the use of IV fentanyl and midazolam under continuous physician and nurse monitoring.  Heart rate, blood pressure, and oxygen saturation were continuously monitored.  Total sedation time was 37 minutes  Upon completion of the case instrument and sharps counts were confirmed correct. The patient was transferred to the PACU in good condition. I was present for all portions  of the procedure.  PLAN: Could not tolerate angiogram with moderate sedation. Will need revascularization to maximize chances of wound healing. Plan pop/TP trunk endarterectomy, toe amputation, venous ulcer debridement in OR Monday.  Yevonne Aline. Stanford Breed, MD [facility] Phone Number: [phone] 02/15/2021 1:27 PM   DG Ang/Ext/Uni/Or Left  Result Date: 02/19/2021 CLINICAL DATA:  Popliteal endarterectomy EXAM: LEFT ANG/EXT/UNI/ OR CONTRAST:  Not provided FLUOROSCOPY TIME:  Fluoroscopy Time:  6 minutes 38 seconds Radiation Exposure Index (if provided by the fluoroscopic device): 84 mGy Number of Acquired Spot Images: 0 COMPARISON:  None. FINDINGS: Seven intraoperative cine clips were provided for interpretation. The submitted images demonstrate left lower extremity angiogram. There is high-grade focal stenosis of the distal popliteal artery which significantly following intervention. Single-vessel runoff to the ankle likely through peroneal artery. IMPRESSION: Intraoperative images of left lower extremity angiogram as above. Electronically Signed   By: Miachel Roux M.D.   On: 02/19/2021 10:16   DG Foot Complete Left  Result Date: 02/14/2021 CLINICAL DATA:  pain, open wound lower leg, hx DM EXAM: LEFT FOOT - COMPLETE 3+ VIEW COMPARISON:  None. FINDINGS: Diffuse soft tissue swelling of the foot. There is no evidence of acute fracture. Vascular calcifications. Mild midfoot arthritis. No frank bony destruction. IMPRESSION: Diffuse soft tissue swelling without evidence of acute osseous abnormality in the left foot. Electronically Signed   By: Maurine Simmering M.D.   On: 02/14/2021 14:38   VAS [place] ABI WITH/WO TBI  Result Date: 02/15/2021  LOWER EXTREMITY DOPPLER STUDY Patient Name:  Adam Perez  Date of Exam:   02/15/2021 Medical Rec #: [id number]       Accession #:    [id number] Date of Birth: 24-Feb-1956       Patient Gender: M Patient Age:   59 years Exam Location:  [facility] Procedure:      VAS [place] ABI WITH/WO TBI Referring Phys: Jamelle Haring --------------------------------------------------------------------------------  Indications: Gangrene. High Risk Factors: Hypertension, Diabetes.  Limitations: Today's exam was limited due to an open wound. Comparison Study: no prior Performing Technologist: Archie Patten RVS  Examination Guidelines: A complete evaluation includes at minimum, Doppler waveform signals and systolic blood pressure reading at the level of bilateral brachial, anterior tibial, and posterior tibial arteries, when vessel segments are accessible. Bilateral testing is considered an integral part of a complete examination. Photoelectric Plethysmograph (PPG) waveforms and toe systolic pressure readings are included as required and additional duplex testing as needed. Limited examinations for reoccurring indications may be performed as noted.  ABI Findings: +--------+------------------+-----+---------+-----------+  Right    Rt Pressure (mmHg) Index Waveform  Comment      +--------+------------------+-----+---------+-----------+  Brachial 144                      triphasic              +--------+------------------+-----+---------+-----------+  PTA                                         not audible  +--------+------------------+-----+---------+-----------+  DP       165                1.15  triphasic              +--------+------------------+-----+---------+-----------+ +--------+------------------+-----+----------+-----------+  Left     Lt Pressure (mmHg) Index Waveform   Comment      +--------+------------------+-----+----------+-----------+  Brachial 139                      triphasic               +--------+------------------+-----+----------+-----------+  PTA                                          not audible  +--------+------------------+-----+----------+-----------+  DP       111                0.77  monophasic               +--------+------------------+-----+----------+-----------+ +-------+-----------+-----------+------------+------------+  ABI/TBI Today's ABI Today's TBI Previous ABI Previous TBI  +-------+-----------+-----------+------------+------------+  Right   1.15                                               +-------+-----------+-----------+------------+------------+  Left    0.77                                               +-------+-----------+-----------+------------+------------+  Summary: Right: Resting right ankle-brachial index is within normal range. No evidence of significant right lower extremity arterial disease. Unable to obtain toe pressures. Left: Resting left ankle-brachial index indicates moderate left lower extremity arterial disease. Unable to obtain toe pressures.  *See table(s) above for measurements and observations.  Electronically signed by Orlie Pollen on 02/15/2021 at 5:15:47 PM.    Final    DG Hip Unilat W or Wo Pelvis 2-3 Views Left  Result Date: 02/14/2021 CLINICAL DATA:  Pain.  Open wound lower leg EXAM: DG HIP (WITH OR WITHOUT PELVIS) 2-3V LEFT COMPARISON:  None. FINDINGS: Hip joints and SI joints symmetric and unremarkable. Degenerative changes in the visualized lower lumbar spine. No acute bony abnormality. Specifically, no fracture, subluxation, or dislocation. Soft tissues are intact. IMPRESSION: No acute bony abnormality. Electronically Signed   By: Rolm Baptise M.D.   On: 02/14/2021 14:33      Subjective: No new complaints.   Discharge Exam: Vitals:   02/23/21 0420 02/23/21 0831  BP: 124/84 138/85  Pulse: 86 77  Resp: 18 16  Temp: 97.9 F (  36.6 C) 97.9 F (36.6 C)  SpO2: 97% 92%   Vitals:   02/22/21 1936 02/22/21 2345 02/23/21 0420 02/23/21 0831  BP: 119/71 119/83 124/84 138/85  Pulse: 74 83 86 77  Resp: 18 18 18 16   Temp: 98.4 F (36.9 C) 98.5 F (36.9 C) 97.9 F (36.6 C) 97.9 F (36.6 C)  TempSrc: Oral Oral Oral Oral  SpO2: 95% 95% 97% 92%  Weight:       Height:        General: Pt is alert, awake, not in acute distress Cardiovascular: RRR, S1/S2 +, no rubs, no gallops Respiratory: CTA bilaterally, no wheezing, no rhonchi Abdominal: Soft, NT, ND, bowel sounds + Extremities: no edema, no cyanosis    The results of significant diagnostics from this hospitalization (including imaging, microbiology, ancillary and laboratory) are listed below for reference.     Microbiology: Recent Results (from the past 240 hour(s))  Culture, blood (routine x 2)     Status: None   Collection Time: 02/14/21  1:24 PM   Specimen: BLOOD  Result Value Ref Range Status   Specimen Description BLOOD RIGHT ANTECUBITAL  Final   Special Requests   Final    BOTTLES DRAWN AEROBIC AND ANAEROBIC Blood Culture adequate volume   Culture   Final    NO GROWTH 5 DAYS Performed at [facility], [address] [place], [place] [address]    Report Status 02/19/2021 FINAL  Final  Culture, blood (routine x 2)     Status: None   Collection Time: 02/14/21  1:44 PM   Specimen: BLOOD  Result Value Ref Range Status   Specimen Description BLOOD BLOOD LEFT HAND  Final   Special Requests   Final    BOTTLES DRAWN AEROBIC AND ANAEROBIC Blood Culture adequate volume   Culture   Final    NO GROWTH 5 DAYS Performed at [facility], [address] [place], [place] [address]    Report Status 02/19/2021 FINAL  Final  Resp Panel by RT-PCR (Flu A&B, Covid) Nasopharyngeal Swab     Status: None   Collection Time: 02/14/21  3:37 PM   Specimen: Nasopharyngeal Swab; Nasopharyngeal(NP) swabs in vial transport medium  Result Value Ref Range Status   SARS Coronavirus 2 by RT PCR NEGATIVE NEGATIVE Final    Comment: (NOTE) SARS-CoV-2 target nucleic acids are NOT DETECTED.  The SARS-CoV-2 RNA is generally detectable in upper respiratory specimens during the acute phase of infection. The lowest concentration of SARS-CoV-2 viral copies this assay can detect is 138 copies/mL. A negative  result does not preclude SARS-Cov-2 infection and should not be used as the sole basis for treatment or other patient management decisions. A negative result may occur with  improper specimen collection/handling, submission of specimen other than nasopharyngeal swab, presence of viral mutation(s) within the areas targeted by this assay, and inadequate number of viral copies(<138 copies/mL). A negative result must be combined with clinical observations, patient history, and epidemiological information. The expected result is Negative.  Fact Sheet for Patients:  EntrepreneurPulse.com.au  Fact Sheet for Healthcare Providers:  IncredibleEmployment.be  This test is no t yet approved or cleared by the [place] FDA and  has been authorized for detection and/or diagnosis of SARS-CoV-2 by FDA under an Emergency Use Authorization (EUA). This EUA will remain  in effect (meaning this test can be used) for the duration of the COVID-19 declaration under Section 564(b)(1) of the Act, 21 U.S.C.section 360bbb-3(b)(1), unless the authorization is terminated  or revoked  sooner.       Influenza A by PCR NEGATIVE NEGATIVE Final   Influenza B by PCR NEGATIVE NEGATIVE Final    Comment: (NOTE) The Xpert Xpress SARS-CoV-2/FLU/RSV plus assay is intended as an aid in the diagnosis of influenza from Nasopharyngeal swab specimens and should not be used as a sole basis for treatment. Nasal washings and aspirates are unacceptable for Xpert Xpress SARS-CoV-2/FLU/RSV testing.  Fact Sheet for Patients: EntrepreneurPulse.com.au  Fact Sheet for Healthcare Providers: IncredibleEmployment.be  This test is not yet approved or cleared by the [place] FDA and has been authorized for detection and/or diagnosis of SARS-CoV-2 by FDA under an Emergency Use Authorization (EUA). This EUA will remain in effect (meaning this test can be used)  for the duration of the COVID-19 declaration under Section 564(b)(1) of the Act, 21 U.S.C. section 360bbb-3(b)(1), unless the authorization is terminated or revoked.  Performed at [facility], [address], [place], [place] [address]      Labs: BNP (last 3 results) No results for input(s): BNP in the last 8760 hours. Basic Metabolic Panel: Recent Labs  Lab 02/19/21 0602 02/19/21 0603 02/19/21 0715 02/20/21 0430 02/21/21 0632 02/22/21 0820 02/23/21 0451  NA 139  --  142 135 136 140  --   K 3.9  --  3.8 3.9 3.5 3.8  --   CL 108  --  108 106 106 110  --   CO2 22  --   --  23 23 24   --   GLUCOSE 107*  --  117* 105* 109* 119*  --   BUN 22  --  21 17 14 13   --   CREATININE 1.07  --  1.00 1.11 1.03   1.04 0.87 0.96  CALCIUM 8.9  --   --  8.5* 8.4* 8.6*  --   MG  --  2.3  --  2.2 2.2  --   --   PHOS 3.8  --   --  3.4 3.1  --   --    Liver Function Tests: Recent Labs  Lab 02/19/21 0602 02/20/21 0430 02/21/21 0632  ALBUMIN 3.0* 2.4* 2.5*   No results for input(s): LIPASE, AMYLASE in the last 168 hours. No results for input(s): AMMONIA in the last 168 hours. CBC: Recent Labs  Lab 02/19/21 0603 02/19/21 0715 02/20/21 0430 02/20/21 1021 02/20/21 1622 02/21/21 0632 02/22/21 0820  WBC 6.3  --  9.3 10.5  --  10.1 9.6  NEUTROABS  --   --   --   --   --   --  7.0  HGB 8.9*   < > 7.1* 8.2* 8.6* 8.4* 8.5*  HCT 27.0*   < > 21.2* 25.1* 26.1* 25.1* 24.6*  MCV 86.5  --  86.5 88.4  --  86.3 85.1  PLT 366  --  313 350  --  349 329   < > = values in this interval not displayed.   Cardiac Enzymes: No results for input(s): CKTOTAL, CKMB, CKMBINDEX, TROPONINI in the last 168 hours. BNP: Invalid input(s): POCBNP CBG: Recent Labs  Lab 02/22/21 0701 02/22/21 1150 02/22/21 1611 02/22/21 2047 02/23/21 0607  GLUCAP 110* 125* 144* 116* 134*   D-Dimer No results for input(s): DDIMER in the last 72 hours. Hgb A1c No results for input(s): HGBA1C in the last 72 hours. Lipid  Profile No results for input(s): CHOL, HDL, LDLCALC, TRIG, CHOLHDL, LDLDIRECT in the last 72 hours. Thyroid function studies No results for input(s): TSH, T4TOTAL,  T3FREE, THYROIDAB in the last 72 hours.  Invalid input(s): FREET3 Anemia work up No results for input(s): VITAMINB12, FOLATE, FERRITIN, TIBC, IRON, RETICCTPCT in the last 72 hours. Urinalysis    Component Value Date/Time   COLORURINE STRAW (A) 07/05/2019 1557   APPEARANCEUR CLEAR 07/05/2019 1557   LABSPEC 1.017 07/05/2019 1557   PHURINE 5.0 07/05/2019 1557   GLUCOSEU >=500 (A) 07/05/2019 1557   HGBUR NEGATIVE 07/05/2019 1557   BILIRUBINUR NEGATIVE 07/05/2019 1557   KETONESUR 20 (A) 07/05/2019 1557   PROTEINUR NEGATIVE 07/05/2019 1557   NITRITE NEGATIVE 07/05/2019 1557   LEUKOCYTESUR NEGATIVE 07/05/2019 1557   Sepsis Labs Invalid input(s): PROCALCITONIN,  WBC,  LACTICIDVEN Microbiology Recent Results (from the past 240 hour(s))  Culture, blood (routine x 2)     Status: None   Collection Time: 02/14/21  1:24 PM   Specimen: BLOOD  Result Value Ref Range Status   Specimen Description BLOOD RIGHT ANTECUBITAL  Final   Special Requests   Final    BOTTLES DRAWN AEROBIC AND ANAEROBIC Blood Culture adequate volume   Culture   Final    NO GROWTH 5 DAYS Performed at [facility], [address] [place], [place] [address]    Report Status 02/19/2021 FINAL  Final  Culture, blood (routine x 2)     Status: None   Collection Time: 02/14/21  1:44 PM   Specimen: BLOOD  Result Value Ref Range Status   Specimen Description BLOOD BLOOD LEFT HAND  Final   Special Requests   Final    BOTTLES DRAWN AEROBIC AND ANAEROBIC Blood Culture adequate volume   Culture   Final    NO GROWTH 5 DAYS Performed at [facility], [address], [place],  [address]    Report Status 02/19/2021 FINAL  Final  Resp Panel by RT-PCR (Flu A&B, Covid) Nasopharyngeal Swab     Status: None   Collection Time: 02/14/21  3:37 PM   Specimen:  Nasopharyngeal Swab; Nasopharyngeal(NP) swabs in vial transport medium  Result Value Ref Range Status   SARS Coronavirus 2 by RT PCR NEGATIVE NEGATIVE Final    Comment: (NOTE) SARS-CoV-2 target nucleic acids are NOT DETECTED.  The SARS-CoV-2 RNA is generally detectable in upper respiratory specimens during the acute phase of infection. The lowest concentration of SARS-CoV-2 viral copies this assay can detect is 138 copies/mL. A negative result does not preclude SARS-Cov-2 infection and should not be used as the sole basis for treatment or other patient management decisions. A negative result may occur with  improper specimen collection/handling, submission of specimen other than nasopharyngeal swab, presence of viral mutation(s) within the areas targeted by this assay, and inadequate number of viral copies(<138 copies/mL). A negative result must be combined with clinical observations, patient history, and epidemiological information. The expected result is Negative.  Fact Sheet for Patients:  EntrepreneurPulse.com.au  Fact Sheet for Healthcare Providers:  IncredibleEmployment.be  This test is no t yet approved or cleared by the [place] FDA and  has been authorized for detection and/or diagnosis of SARS-CoV-2 by FDA under an Emergency Use Authorization (EUA). This EUA will remain  in effect (meaning this test can be used) for the duration of the COVID-19 declaration under Section 564(b)(1) of the Act, 21 U.S.C.section 360bbb-3(b)(1), unless the authorization is terminated  or revoked sooner.       Influenza A by PCR NEGATIVE NEGATIVE Final   Influenza B by PCR NEGATIVE NEGATIVE Final    Comment: (NOTE) The Xpert Xpress SARS-CoV-2/FLU/RSV plus  assay is intended as an aid in the diagnosis of influenza from Nasopharyngeal swab specimens and should not be used as a sole basis for treatment. Nasal washings and aspirates are unacceptable for  Xpert Xpress SARS-CoV-2/FLU/RSV testing.  Fact Sheet for Patients: EntrepreneurPulse.com.au  Fact Sheet for Healthcare Providers: IncredibleEmployment.be  This test is not yet approved or cleared by the [place] FDA and has been authorized for detection and/or diagnosis of SARS-CoV-2 by FDA under an Emergency Use Authorization (EUA). This EUA will remain in effect (meaning this test can be used) for the duration of the COVID-19 declaration under Section 564(b)(1) of the Act, 21 U.S.C. section 360bbb-3(b)(1), unless the authorization is terminated or revoked.  Performed at [facility], [address], [place], [place] [address]      Time coordinating discharge: 36 minutes.   SIGNED:   Hosie Poisson, MD  [facility] 02/23/2021, 10:47 AM

## 2021-02-23 NOTE — Progress Notes (Signed)
Adam Staggers, MD  Physician Physical Medicine and Rehabilitation PMR Pre-admission     Signed Date of Service:  02/22/2021  3:16 PM  Related encounter: ED to Hosp-Admission (Current) from 02/14/2021 in [facility] 4E CV SURGICAL PROGRESSIVE CARE   Signed      Show:Clear all [x] Written[x] Templated[x] Copied  Added by: [x] Cristina Gong, RN[x] Adam Staggers, MD  [] Hover for details                                                                                                                                                                                                                                                                                                                                                                                                                                                                      PMR Admission Coordinator Pre-Admission Assessment   Patient: Adam Perez is an 65 y.o., male MRN: [id number] DOB: 1955/05/04 Height: 5' 7"  (170.2 cm) Weight: 109.3 kg   Insurance Information HMO:     PPO:      PCP:      IPA:      80/20:      OTHER:  PRIMARY: Medicare a and b      Policy#: [id number]      Subscriber:  pt Benefits:  Phone #: passport one source online     Name: 12/14 Eff. Date: 08/10/2018     Deduct: $1556      Out of Pocket Max: none      Life Max: none CIR: 100%      SNF: 20 full days Outpatient: 80%     Co-Pay: 20% Home Health: 100%      Co-Pay: none DME: 80%     Co-Pay: 20% Providers: pt choice  SECONDARY: Tricare for Life      Policy#: [id number]   Financial Counselor:       Phone#:    The Data Collection Information Summary for patients in Inpatient Rehabilitation Facilities with attached Privacy Act [place] Records was provided and verbally reviewed with: Patient    Emergency Contact Information Contact Information       Name Relation Home Work Mobile    Palmieri,[name] Spouse     [phone]         Current Medical History  Patient Admitting Diagnosis: TMA   History of Present Illness:  65 year old right-handed male with history of hypertension, CVA with left-sided residual weakness maintained on aspirin and Plavix, diabetes mellitus, obesity with BMI 37.74.  Per chart review patient currently separated from his wife lives alone with plans to stay with his wife on discharge.  1 level home with level entry.  Modified independent prior to admission.  Presented 02/14/2021 with left side hip pain and malodorous wounds in the lower extremities including the toes of the left foot.  Reports the wound has been progressing over the last 2 months and is followed at the [facility] clinic in [place].  Admission chemistry potassium 5.2 glucose 122 BUN 26 creatinine 1.31, hemoglobin 10.7, lactic acid 2.5, blood cultures no growth to date, sedimentation rate 76.  MRI of the left foot showed marked subcutaneous soft tissue swelling edema fluid consistent with cellulitis.  Osteomyelitis involving the entire second toe and distal phalanx of the third toe.  MRI of the left hip mild bilateral hip degenerative changes but no stress fracture or AVN.  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 Jamelle Haring and wound VAC applied.  Partial weightbearing left lower extremity through heel only with Darco shoe.  Maintained on Lovenox for DVT prophylaxis.  His chronic aspirin and Plavix have been resumed.  Acute blood loss anemia 8.5 and monitored.    Patient's medical record from [facility] has been reviewed by the rehabilitation admission coordinator and physician.   Past Medical History      Past Medical History:  Diagnosis Date   Diabetes mellitus without complication ([place])     Hemiparesis ([place])       left side; residual from prior acute ischemic CVA   HTN (hypertension)     Ischemic cerebrovascular accident (CVA) ([place])      with residual left hemiparesis    Has the patient had major surgery during 100 days prior to admission? Yes   Family History   family history includes Diabetes in his brother and brother.   Current Medications   Current Facility-Administered Medications:    (feeding supplement) PROSource Plus liquid 30 mL, 30 mL, Oral, BID WC, Eveland, Matthew, PA-C, 30 mL at 02/23/21 0830   0.9 %  sodium chloride infusion (Manually program via Guardrails IV Fluids), , Intravenous, Once, Dagoberto Ligas, PA-C   amLODipine (NORVASC) tablet 10 mg, 10 mg, Oral, Daily, Eveland, Matthew, PA-C, 10 mg  at 02/23/21 0981   aspirin EC tablet 81 mg, 81 mg, Oral, Q breakfast, Dagoberto Ligas, PA-C, 81 mg at 02/23/21 1914   atorvastatin (LIPITOR) tablet 40 mg, 40 mg, Oral, Daily, Dagoberto Ligas, PA-C, 40 mg at 02/23/21 7829   clopidogrel (PLAVIX) tablet 75 mg, 75 mg, Oral, Daily, Hosie Poisson, MD, 75 mg at 02/23/21 0829   enoxaparin (LOVENOX) injection 55 mg, 55 mg, Subcutaneous, QHS, Dagoberto Ligas, PA-C, 55 mg at 02/22/21 2125   feeding supplement (GLUCERNA SHAKE) (GLUCERNA SHAKE) liquid 237 mL, 237 mL, Oral, TID BM, Hosie Poisson, MD, 237 mL at 02/23/21 0829   hydrALAZINE (APRESOLINE) injection 5 mg, 5 mg, Intravenous, Q20 Min PRN, Eveland, Matthew, PA-C   insulin aspart (novoLOG) injection 0-15 Units, 0-15 Units, Subcutaneous, TID WC, Dagoberto Ligas, PA-C, 2 Units at 02/23/21 0836   insulin aspart (novoLOG) injection 0-5 Units, 0-5 Units, Subcutaneous, QHS, Eveland, Matthew, PA-C   insulin glargine-yfgn (SEMGLEE) injection 10 Units, 10 Units, Subcutaneous, QHS, Dagoberto Ligas, PA-C, 10 Units at 02/22/21 2126   labetalol (NORMODYNE) injection 10 mg, 10 mg, Intravenous, Q10 min PRN, Dagoberto Ligas, PA-C   lurasidone (LATUDA) tablet 20 mg, 20 mg, Oral, Daily, Dagoberto Ligas,  PA-C, 20 mg at 02/23/21 5621   methocarbamol (ROBAXIN) 500 mg in dextrose 5 % 50 mL IVPB, 500 mg, Intravenous, Q6H PRN, Dagoberto Ligas, PA-C, Stopped at 02/14/21 2125   morphine 2 MG/ML injection 2 mg, 2 mg, Intravenous, Q2H PRN, Dagoberto Ligas, PA-C, 2 mg at 02/21/21 1101   multivitamin with minerals tablet 1 tablet, 1 tablet, Oral, Daily, Dagoberto Ligas, PA-C, 1 tablet at 02/23/21 0828   ondansetron (ZOFRAN) injection 4 mg, 4 mg, Intravenous, Q6H PRN, Dagoberto Ligas, PA-C   oxyCODONE-acetaminophen (PERCOCET/ROXICET) 5-325 MG per tablet 1-2 tablet, 1-2 tablet, Oral, Q4H PRN, Dagoberto Ligas, PA-C, 2 tablet at 02/23/21 3086   polyethylene glycol (MIRALAX / GLYCOLAX) packet 17 g, 17 g, Oral, BID PRN, Cyndia Skeeters, Taye T, MD   senna-docusate (Senokot-S) tablet 1 tablet, 1 tablet, Oral, BID PRN, Wendee Beavers T, MD, 1 tablet at 02/22/21 2150   sertraline (ZOLOFT) tablet 150 mg, 150 mg, Oral, Daily, Dagoberto Ligas, PA-C, 150 mg at 02/23/21 5784   traZODone (DESYREL) tablet 100 mg, 100 mg, Oral, QHS, Dagoberto Ligas, PA-C, 100 mg at 02/22/21 2125   Patients Current Diet:  Diet Order                  Diet regular Room service appropriate? Yes with Assist; Fluid consistency: Thin  Diet effective now             Diet - low sodium heart healthy                         Precautions / Restrictions Precautions Precautions: Fall Other Brace: darco shoe, unable to apply today due to bandaging Restrictions Weight Bearing Restrictions: Yes LLE Weight Bearing: Partial weight bearing LLE Partial Weight Bearing Percentage or Pounds:  (heel only)    Has the patient had 2 or more falls or a fall with injury in the past year? No   Prior Activity Level Limited Community (1-2x/wk): Mod I with cane   Prior Functional Level Self Care: Did the patient need help bathing, dressing, using the toilet or eating? Independent   Indoor Mobility: Did the patient need assistance with walking from room to  room (with or without device)? Independent   Stairs: Did the patient need assistance with internal or external  stairs (with or without device)? Independent   Functional Cognition: Did the patient need help planning regular tasks such as shopping or remembering to take medications? Independent   Patient Information Are you of Hispanic, Latino/a,or Spanish origin?: A. No, not of Hispanic, Latino/a, or Spanish origin What is your race?: B. Black or African American Do you need or want an interpreter to communicate with a doctor or health care staff?: 0. No   Patient's Response To:  Health Literacy and Transportation Is the patient able to respond to health literacy and transportation needs?: Yes Health Literacy - How often do you need to have someone help you when you read instructions, pamphlets, or other written material from your doctor or pharmacy?: Never In the past 12 months, has lack of transportation kept you from medical appointments or from getting medications?: No In the past 12 months, has lack of transportation kept you from meetings, work, or from getting things needed for daily living?: No   Home Assistive Devices / [place] Devices/Equipment: Radio producer (specify quad or straight) Home Equipment: Cane - quad, Transport planner   Prior Device Use: Indicate devices/aids used by the patient prior to current illness, exacerbation or injury?  cane   Current Functional Level Cognition   Overall Cognitive Status: No family/caregiver present to determine baseline cognitive functioning Current Attention Level: Sustained Orientation Level: Oriented X4 Following Commands: Follows one step commands with increased time Safety/Judgement: Decreased awareness of safety General Comments: Pt stating he had been thinking about how he was going get to the chair all day, allowed pt to attempt, but unsuccessful using bed rail with HOB up.    Extremity Assessment (includes  Sensation/Coordination)   Upper Extremity Assessment: LUE deficits/detail LUE Deficits / Details: residual weakness from prior stroke, spasticity with decreased elbow extension and shoulder movement LUE Coordination: decreased fine motor, decreased gross motor  Lower Extremity Assessment: Defer to PT evaluation RLE Deficits / Details: Hip flexion, knee extension, knee flexion 4/5 LLE Deficits / Details: decreased AROM and strength grossly     ADLs   Overall ADL's : Needs assistance/impaired Eating/Feeding: Set up, Bed level Grooming: Set up, Bed level Upper Body Bathing: Minimal assistance, Sitting Lower Body Bathing: Total assistance, Sit to/from stand, +2 for physical assistance Upper Body Dressing : Minimal assistance, Sitting Lower Body Dressing: +2 for physical assistance, Total assistance, Sit to/from stand Toileting- Clothing Manipulation and Hygiene: Total assistance, Bed level Functional mobility during ADLs: Min guard     Mobility   Overal bed mobility: Needs Assistance Bed Mobility: Supine to Sit, Sit to Supine Supine to sit: +2 for physical assistance, Mod assist Sit to supine: +2 for physical assistance, Mod assist General bed mobility comments: heavy reliance on rail, HOB up, assist for LEs over EOB and to raise trunk, guided upper body and assisted LEs back into bed     Transfers   Overall transfer level: Needs assistance Equipment used: Rolling walker (2 wheels) Transfers: Sit to/from Stand Sit to Stand: +2 physical assistance, Max assist, From elevated surface General transfer comment: Attempted to stand multiple times with different techniques. Cues for hand placement with RW, assist to rise, unable to fully stand. Stand pivot to recliner deferred due to pt needing to have a BM.     Ambulation / Gait / Stairs / Wheelchair Mobility   Ambulation/Gait Ambulation/Gait assistance: Min guard, Herbalist (Feet): 48 Feet Assistive device: Rolling walker (2  wheels) Gait Pattern/deviations: Step-to pattern, Decreased weight shift to  left General Gait Details: ambulated to bathroom and back to recliner; one LOB requring min A to correct Gait velocity: decreased     Posture / Balance Balance Overall balance assessment: Needs assistance Sitting-balance support: Feet supported, No upper extremity supported Sitting balance-Leahy Scale: Fair Standing balance support: Bilateral upper extremity supported Standing balance-Leahy Scale: Poor Standing balance comment: +2 assist and RW for partial stand     Special needs/care consideration Wound VAC  Registered Nurse WOC Consult Note     Signed Date of Service:  02/23/2021  9:05 AM           Signed        Show:Clear all [x] Written[x] Templated[x] Copied   Added by: [x] Salley Slaughter, RN   [] Hover for details                                                               WOC Nurse Consult Note: Patient receiving care in [place], PA-C present for first vac change.  Reason for Consult: Wound vac dressing change Wound type: Surgical LLE  Pressure Injury POA: NA Measurement: 11 cm x 12 cm Wound bed: Beefy red with myriad skin substitute Drainage (amount, consistency, odor) Serosanguinous in canister Periwound: Chronic venous disease with hardened LE skin Dressing procedure/placement/frequency: Orders placed as follows: Leave green myriad skin substitute in place. Apply adaptic over substitute followed by, hydrogel, then black foam Today we were not able to save 100% of the Myriad skin substitute due to sticking to the foam dressing. We then applied Curad non-stick oil emulsion dressing (comparable to adaptic) then applied a coat of gel before applying the black foam. The drape had to be wrapped around the leg due to the hardened skin making it very difficult for the drape to stick. Attempted skin barrier wipes on the surrounding skin but the drape still wound not stick.  Immediate seal was obtained at 125 mmHg. The track pad was placed up and an ace wrap was applied around the tubing to prevent pressure on the leg or the patient tripping over the tubing. Needs to be premedicated prior to change.   WOC will follow MWF. Please re-consult for any other needs.    Cathlean Marseilles Tamala Julian, MSN, RN, [place], Lysle Pearl, [facility] Wound Treatment Associate Pager [phone]           Note Details   Author Salley Slaughter, RN File Time 02/23/2021  9:25 AM  Author Type Registered Nurse Status Signed  Last Editor Salley Slaughter, RN Service [place] # [phone] Admit Date 02/14/2021    Previous Home Environment  Living Arrangements: Alone  Lives With: Alone Available Help at Discharge:  (wife to assist; they have been separated) Type of Home: House Home Layout: One level Home Access: Level entry Bathroom Shower/Tub: Chiropodist: Standard Bathroom Accessibility: Yes Home Care Services: No Additional Comments:  (separated from wife for 20 years, but she will asisst at d/c)   Discharge Living Setting Plans for Discharge Living Setting: Patient's home, Alone Type of Home at Discharge: House Discharge Home Layout: One level Discharge Home Access: Level entry Discharge Bathroom Shower/Tub: Tub/shower unit Discharge Bathroom Toilet: Standard Discharge Bathroom Accessibility: Yes How Accessible: Accessible via walker (wheelchair accessible home) Does the patient have any problems  obtaining your medications?: No   Social/Family/Support Systems Contact Information: spouse, Deneise Lever. They have been seperated for 20 years, but she and their 2 daughters to asist Anticipated Caregiver: Deneise Lever Anticipated Ambulance person Information: see contacts Ability/Limitations of Caregiver: none Caregiver Availability: Intermittent Discharge Plan Discussed with Primary Caregiver: Yes Is Caregiver In Agreement with Plan?: Yes Does Caregiver/Family have Issues with  Lodging/Transportation while Pt is in Rehab?: No   Goals Patient/Family Goal for Rehab: supervision to min assist with PT and OT Expected length of stay: ELOS 10 to 12 days Pt/Family Agrees to Admission and willing to participate: Yes Program Orientation Provided & Reviewed with Pt/Caregiver Including Roles  & Responsibilities: Yes   Decrease burden of Care through IP rehab admission: n/a   Possible need for SNF placement upon discharge: not anticipated   Patient Condition: I have reviewed medical records from [facility], spoken with CM, and patient and spouse. I met with patient at the bedside for inpatient rehabilitation assessment.  Patient will benefit from ongoing PT and OT, can actively participate in 3 hours of therapy a day 5 days of the week, and can make measurable gains during the admission.  Patient will also benefit from the coordinated team approach during an Inpatient Acute Rehabilitation admission.  The patient will receive intensive therapy as well as Rehabilitation physician, nursing, social worker, and care management interventions.  Due to bladder management, bowel management, safety, skin/wound care, disease management, medication administration, pain management, and patient education the patient requires 24 hour a day rehabilitation nursing.  The patient is currently mod to max assist overall with mobility and basic ADLs.  Discharge setting and therapy post discharge at home with home health is anticipated.  Patient has agreed to participate in the Acute Inpatient Rehabilitation Program and will admit today.   Preadmission Screen Completed By:  Cleatrice Burke, 02/23/2021 11:10 AM ______________________________________________________________________   Discussed status with Dr. Naaman Plummer on  02/23/2021 at 53 and received approval for admission today.   Admission Coordinator:  Cleatrice Burke, RN, time  3704 Date  02/23/2021     Assessment/Plan: Diagnosis: Left TMA Does the need for close, 24 hr/day Medical supervision in concert with the patient's rehab needs make it unreasonable for this patient to be served in a less intensive setting? Yes Co-Morbidities requiring supervision/potential complications: hx of cva, dm, obesity Due to bladder management, bowel management, safety, skin/wound care, disease management, medication administration, pain management, and patient education, does the patient require 24 hr/day rehab nursing? Yes Does the patient require coordinated care of a physician, rehab nurse, PT, OT to address physical and functional deficits in the context of the above medical diagnosis(es)? Yes Addressing deficits in the following areas: balance, endurance, locomotion, strength, transferring, bowel/bladder control, bathing, dressing, feeding, grooming, toileting, and psychosocial support Can the patient actively participate in an intensive therapy program of at least 3 hrs of therapy 5 days a week? Yes The potential for patient to make measurable gains while on inpatient rehab is excellent Anticipated functional outcomes upon discharge from inpatient rehab: supervision and min assist PT, supervision and min assist OT, n/a SLP Estimated rehab length of stay to reach the above functional goals is: 10-12 days Anticipated discharge destination: Home 10. Overall Rehab/Functional Prognosis: excellent     MD Signature: Adam Staggers, MD, [place] Director Rehabilitation Services 02/23/2021           Revision History  Note Details  Author Adam Staggers, MD File Time 02/23/2021 11:21 AM  Author Type Physician Status Signed  Last Editor Adam Staggers, MD Service Physical Medicine and Rehabilitation

## 2021-02-23 NOTE — TOC Transition Note (Signed)
Transition of Care (TOC) - CM/SW Discharge Note Donn Pierini RN, BSN Transitions of Care Unit 4E- RN Case Manager See Treatment Team for direct phone #    Patient Details  Name: Adam Perez MRN: [id number] Date of Birth: December 29, 1955  Transition of Care [facility]) CM/SW Contact:  Darrold Span, RN Phone Number: 02/23/2021, 11:46 AM   Clinical Narrative:    Have been notified this am by Britta Mccreedy with [facility] re[facility] that pt will have bed available today. MD has cleared pt for transition to INPT rehab and pt will transition there later today.      Final next level of care: IP Rehab Facility Barriers to Discharge: Barriers Resolved   Patient Goals and CMS Choice Patient states their goals for this hospitalization and ongoing recovery are:: rehab      Discharge Placement               [facility]        Discharge Plan and Services In-house Referral: NA Discharge Planning Services: CM Consult Post Acute Care Choice: IP Rehab          DME Arranged: N/A DME Agency: NA       HH Arranged: NA HH Agency: NA        Social Determinants of Health (SDOH) Interventions     Readmission Risk Interventions Readmission Risk Prevention Plan 02/23/2021  Transportation Screening Complete  HRI or Home Care Consult Complete  Social Work Consult for Recovery Care Planning/Counseling Complete  Palliative Care Screening Not Applicable  Medication Review Oceanographer) Complete  Some recent data might be hidden

## 2021-02-23 NOTE — H&P (Signed)
Physical Medicine and Rehabilitation Admission H&P        Chief Complaint  Patient presents with   Knee Pain  : HPI: Adam Perez is a 65 year old right-handed male with history of hypertension, CVA with left-sided residual weakness maintained on aspirin and Plavix, diabetes mellitus, obesity with BMI 37.74.  Per chart review patient currently separated from his wife lives alone with plans to stay with his wife on discharge.  1 level home with level entry.  Modified independent prior to admission.  Presented 02/14/2021 with left side hip pain and malodorous wounds in the lower extremities including the toes of the left foot.  Reports the wound has been progressing over the last 2 months and is followed at the [facility] clinic in [place].  Admission chemistry potassium 5.2 glucose 122 BUN 26 creatinine 1.31, hemoglobin 10.7, lactic acid 2.5, blood cultures no growth to date, sedimentation rate 76.  MRI of the left foot showed marked subcutaneous soft tissue swelling edema fluid consistent with cellulitis.  Osteomyelitis involving the entire second toe and distal phalanx of the third toe.  MRI of the left hip mild bilateral hip degenerative changes but no stress fracture or AVN.  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 Jamelle Haring and wound VAC applied.  Partial weightbearing left lower extremity through heel only with Darco shoe.  Maintained on Lovenox for DVT prophylaxis.  His chronic aspirin and Plavix have been resumed.  Acute blood loss anemia 8.5 and monitored.  Therapy evaluations completed due to patient decreased functional mobility was admitted for a comprehensive rehab program.   Review of Systems  Constitutional:  Positive for fever. Negative for chills.  HENT:  Negative for hearing loss.   Eyes:  Negative for blurred vision and double vision.  Respiratory:  Negative for cough and shortness of breath.    Cardiovascular:  Positive for leg swelling. Negative for chest pain and palpitations.  Gastrointestinal:  Positive for constipation. Negative for heartburn, nausea and vomiting.  Genitourinary:  Negative for dysuria, flank pain and hematuria.  Musculoskeletal:  Positive for joint pain and myalgias.  Skin:  Negative for rash.  Psychiatric/Behavioral:  Positive for depression.   All other systems reviewed and are negative.     Past Medical History:  Diagnosis Date   Diabetes mellitus without complication ([place])     Hemiparesis ([place])      left side; residual from prior acute ischemic CVA   HTN (hypertension)     Ischemic cerebrovascular accident (CVA) ([place])      with residual left hemiparesis         Past Surgical History:  Procedure Laterality Date   ANGIOPLASTY Left 02/19/2021    Procedure: POPLITEAL ANGIOPLASTY;  Surgeon: Cherre Robins, MD;  Location: [place];  Service: Vascular;  Laterality: Left;   LOWER EXTREMITY ANGIOGRAM Left 02/19/2021    Procedure: LEFT LOWER EXTREMITY ANGIOGRAM;  Surgeon: Cherre Robins, MD;  Location: [place];  Service: Vascular;  Laterality: Left;   LOWER EXTREMITY ANGIOGRAPHY N/A 02/15/2021    Procedure: LOWER EXTREMITY ANGIOGRAPHY;  Surgeon: Cherre Robins, MD;  Location: [place] [facility];  Service: Cardiovascular;  Laterality: N/A;   TRANSMETATARSAL AMPUTATION Left 02/19/2021    Procedure: LEFT TRANSMETATARSAL AMPUTATION;  Surgeon: Cherre Robins, MD;  Location: [place];  Service: Vascular;  Laterality: Left;   ULTRASOUND GUIDANCE FOR VASCULAR ACCESS Right 02/19/2021    Procedure: ULTRASOUND GUIDANCE FOR RIGHT COMMON  FEMORAL ARTERY ACCESS;  Surgeon: Cherre Robins, MD;  Location: [facility] OR;  Service: Vascular;  Laterality: Right;   WOUND DEBRIDEMENT Left 02/19/2021    Procedure: LEFT CALF VENOUS ULCER SHARP EXCISIONAL DEBRIDEMENT;  Surgeon: Cherre Robins, MD;  Location: [facility] OR;  Service: Vascular;  Laterality: Left;         Family History   Problem Relation Age of Onset   Diabetes Brother     Diabetes Brother      Social History:  reports that he has never smoked. He has never used smokeless tobacco. He reports that he does not drink alcohol and does not use drugs. Allergies: Not on File       Medications Prior to Admission  Medication Sig Dispense Refill   acetaminophen (TYLENOL) 325 MG tablet Take 2 tablets (650 mg total) by mouth every 6 (six) hours as needed for mild pain (or Fever >/= 101). 30 tablet 3   amLODipine (NORVASC) 10 MG tablet Take 1 tablet (10 mg total) by mouth daily. 30 tablet 5   aspirin EC 81 MG tablet Take 1 tablet (81 mg total) by mouth daily with breakfast. 120 tablet 3   atorvastatin (LIPITOR) 40 MG tablet Take 1 tablet (40 mg total) by mouth daily. 30 tablet 5   baclofen (LIORESAL) 10 MG tablet Take 30 mg by mouth daily.       clopidogrel (PLAVIX) 75 MG tablet Take 1 tablet (75 mg total) by mouth daily. 30 tablet 5   glimepiride (AMARYL) 2 MG tablet Take 1 tablet (2 mg total) by mouth 2 (two) times daily with a meal. 60 tablet 5   insulin glargine (LANTUS) 100 UNIT/ML Solostar Pen Inject 10 Units into the skin at bedtime. 15 mL 11   lisinopril-hydrochlorothiazide (ZESTORETIC) 20-25 MG tablet Take 1 tablet by mouth daily.       lurasidone (LATUDA) 20 MG TABS tablet Take 1 tablet (20 mg total) by mouth daily. 60 tablet 3   metFORMIN (GLUCOPHAGE) 500 MG tablet Take 500 mg by mouth 2 (two) times daily with a meal.       sertraline (ZOLOFT) 100 MG tablet Take 1.5 tablets (150 mg total) by mouth daily. 45 tablet 5   traZODone (DESYREL) 100 MG tablet TAKE TWO TABLETS BY MOUTH AT BEDTIME FOR SLEEP. TAKE AT 8PM.       blood glucose meter kit and supplies Relion Prime or Dispense other brand based on patient and insurance preference. Use up to four times daily as directed. (FOR ICD-9 250.00, 250.01). 1 each 3   blood glucose meter kit and supplies Dispense based on patient and insurance preference. Use up to  four times daily as directed. (FOR ICD-9 250.00, 250.01). 1 each 1   Insulin Pen Needle (PEN NEEDLES 3/16") 31G X 5 MM MISC 1 Units by Does not apply route at bedtime. Use it to administer insulin 100 each 5   lisinopril (ZESTRIL) 20 MG tablet Take 1 tablet (20 mg total) by mouth daily. (Patient not taking: Reported on 02/14/2021) 30 tablet 3   metFORMIN (GLUCOPHAGE) 1000 MG tablet Take 1 tablet (1,000 mg total) by mouth 2 (two) times daily with a meal. 60 tablet 11   polyethylene glycol powder (GLYCOLAX/MIRALAX) 17 GM/SCOOP powder Take by mouth. (Patient not taking: Reported on 02/14/2021)       prazosin (MINIPRESS) 1 MG capsule Take 1 capsule by mouth at bedtime. (Patient not taking: Reported on 02/14/2021)       sertraline (ZOLOFT)  100 MG tablet TAKE ONE TABLET BY MOUTH DAILY FOR MENTAL HEALTH. NOTE INCREASE IN DOSE (Patient not taking: Reported on 02/14/2021)          Drug Regimen Review Drug regimen was reviewed and remains appropriate with no significant issues identified   Home: Home Living Family/patient expects to be discharged to:: Private residence Living Arrangements: Alone Available Help at Discharge:  (wife to assist; they have been separated) Type of Home: House Home Access: Level entry Home Layout: One level Bathroom Shower/Tub: Chiropodist: Standard Bathroom Accessibility: Yes Home Equipment: Cane - quad, Transport planner Additional Comments:  (separated from wife for 20 years, but she will asisst at d/c)  Lives With: Alone   Functional History: Prior Function Prior Level of Function : Independent/Modified Independent, Driving Mobility Comments: Ambulated in home and community with quad cane ADLs Comments: Independent in ADL, IADL, driving   Functional Status:  Mobility: Bed Mobility Overal bed mobility: Needs Assistance Bed Mobility: Supine to Sit, Sit to Supine Supine to sit: +2 for physical assistance, Mod assist Sit to supine: +2 for  physical assistance, Mod assist General bed mobility comments: heavy reliance on rail, HOB up, assist for LEs over EOB and to raise trunk, guided upper body and assisted LEs back into bed Transfers Overall transfer level: Needs assistance Equipment used: Rolling walker (2 wheels) Transfers: Sit to/from Stand Sit to Stand: +2 physical assistance, Max assist, From elevated surface General transfer comment: Attempted to stand multiple times with different techniques. Cues for hand placement with RW, assist to rise, unable to fully stand. Stand pivot to recliner deferred due to pt needing to have a BM. Ambulation/Gait Ambulation/Gait assistance: Min guard, Min assist Gait Distance (Feet): 48 Feet Assistive device: Rolling walker (2 wheels) Gait Pattern/deviations: Step-to pattern, Decreased weight shift to left General Gait Details: ambulated to bathroom and back to recliner; one LOB requring min A to correct Gait velocity: decreased   ADL: ADL Overall ADL's : Needs assistance/impaired Eating/Feeding: Set up, Bed level Grooming: Set up, Bed level Upper Body Bathing: Minimal assistance, Sitting Lower Body Bathing: Total assistance, Sit to/from stand, +2 for physical assistance Upper Body Dressing : Minimal assistance, Sitting Lower Body Dressing: +2 for physical assistance, Total assistance, Sit to/from stand Toileting- Clothing Manipulation and Hygiene: Total assistance, Bed level Functional mobility during ADLs: Min guard   Cognition: Cognition Overall Cognitive Status: No family/caregiver present to determine baseline cognitive functioning Orientation Level: Oriented X4 Cognition Arousal/Alertness: Awake/alert Behavior During Therapy: WFL for tasks assessed/performed, Anxious Overall Cognitive Status: No family/caregiver present to determine baseline cognitive functioning Area of Impairment: Safety/judgement, Problem solving, Awareness, Following commands Current Attention Level:  Sustained Following Commands: Follows one step commands with increased time Safety/Judgement: Decreased awareness of safety Awareness: Emergent Problem Solving: Slow processing, Difficulty sequencing, Requires verbal cues General Comments: Pt stating he had been thinking about how he was going get to the chair all day, allowed pt to attempt, but unsuccessful using bed rail with HOB up.   Physical Exam: Blood pressure 124/84, pulse 86, temperature 97.9 F (36.6 C), temperature source Oral, resp. rate 18, height $RemoveBe'5\' 7"'[id number]$  (1.702 m), weight 109.3 kg, SpO2 97 %. Physical Exam Constitutional:      General: He is not in acute distress.    Appearance: He is obese.  HENT:     Head: Normocephalic and atraumatic.     Right Ear: External ear normal.     Left Ear: External ear normal.     Mouth/Throat:  Mouth: Mucous membranes are moist.     Pharynx: Oropharynx is clear.  Eyes:     Extraocular Movements: Extraocular movements intact.     Pupils: Pupils are equal, round, and reactive to light.  Cardiovascular:     Rate and Rhythm: Normal rate and regular rhythm.     Heart sounds: No murmur heard.   No gallop.  Pulmonary:     Effort: Pulmonary effort is normal. No respiratory distress.     Breath sounds: No wheezing.  Abdominal:     General: Bowel sounds are normal. There is no distension.     Palpations: Abdomen is soft.     Tenderness: There is no abdominal tenderness.  Genitourinary:    Comments: Has purewick in place Musculoskeletal:        General: Normal range of motion.     Cervical back: Normal range of motion. No rigidity.  Skin:    Comments: Left lower extremity TMA site dressed with full Ace wrap through bypass site.  VAC in place. very tender to palpation. RLE very sensitive to touch, skin very dry, no breakdown  Neurological:     Comments: Patient is alert oriented x3 and follows commands. Functional memory, concentration.RUE and LUE grossly 4+/5. LLE with flexor tone and  limited testing d/t pain/dressing distal limb. RUE 4 to 4+/5 prox to distal. ? Mild sensory loss distally RLE  Psychiatric:        Mood and Affect: Mood normal.        Behavior: Behavior normal.      Lab Results Last 48 Hours        Results for orders placed or performed during the hospital encounter of 02/14/21 (from the past 48 hour(s))  Glucose, capillary     Status: Abnormal    Collection Time: 02/21/21  6:08 AM  Result Value Ref Range    Glucose-Capillary 108 (H) 70 - 99 mg/dL      Comment: Glucose reference range applies only to samples taken after fasting for at least 8 hours.  Creatinine, serum     Status: None    Collection Time: 02/21/21  6:32 AM  Result Value Ref Range    Creatinine, Ser 1.03 0.61 - 1.24 mg/dL    GFR, Estimated >60 >60 mL/min      Comment: (NOTE) Calculated using the CKD-EPI Creatinine Equation (2021) Performed at [facility] [address] [place], [place] [address]    Renal function panel     Status: Abnormal    Collection Time: 02/21/21  6:32 AM  Result Value Ref Range    Sodium 136 135 - 145 mmol/L    Potassium 3.5 3.5 - 5.1 mmol/L    Chloride 106 98 - 111 mmol/L    CO2 23 22 - 32 mmol/L    Glucose, Bld 109 (H) 70 - 99 mg/dL      Comment: Glucose reference range applies only to samples taken after fasting for at least 8 hours.    BUN 14 8 - 23 mg/dL    Creatinine, Ser 1.04 0.61 - 1.24 mg/dL    Calcium 8.4 (L) 8.9 - 10.3 mg/dL    Phosphorus 3.1 2.5 - 4.6 mg/dL    Albumin 2.5 (L) 3.5 - 5.0 g/dL    GFR, Estimated >60 >60 mL/min      Comment: (NOTE) Calculated using the CKD-EPI Creatinine Equation (2021)      Anion gap 7 5 - 15      Comment: Performed  at [facility], [place] [address] [place], [place] [address]  Magnesium     Status: None    Collection Time: 02/21/21  6:32 AM  Result Value Ref Range    Magnesium 2.2 1.7 - 2.4 mg/dL      Comment: Performed at [facility] [address] [place], [place] [address]  CBC      Status: Abnormal    Collection Time: 02/21/21  6:32 AM  Result Value Ref Range    WBC 10.1 4.0 - 10.5 K/uL    RBC 2.91 (L) 4.22 - 5.81 MIL/uL    Hemoglobin 8.4 (L) 13.0 - 17.0 g/dL    HCT 25.1 (L) 39.0 - 52.0 %    MCV 86.3 80.0 - 100.0 fL    MCH 28.9 26.0 - 34.0 pg    MCHC 33.5 30.0 - 36.0 g/dL    RDW 15.8 (H) 11.5 - 15.5 %    Platelets 349 150 - 400 K/uL    nRBC 0.0 0.0 - 0.2 %      Comment: Performed at [facility], [place] [address], [place], [place] [address]  Glucose, capillary     Status: Abnormal    Collection Time: 02/21/21 11:20 AM  Result Value Ref Range    Glucose-Capillary 110 (H) 70 - 99 mg/dL      Comment: Glucose reference range applies only to samples taken after fasting for at least 8 hours.  Glucose, capillary     Status: Abnormal    Collection Time: 02/21/21  4:27 PM  Result Value Ref Range    Glucose-Capillary 132 (H) 70 - 99 mg/dL      Comment: Glucose reference range applies only to samples taken after fasting for at least 8 hours.  Glucose, capillary     Status: Abnormal    Collection Time: 02/21/21  9:27 PM  Result Value Ref Range    Glucose-Capillary 153 (H) 70 - 99 mg/dL      Comment: Glucose reference range applies only to samples taken after fasting for at least 8 hours.    Comment 1 Notify RN      Comment 2 Document in Chart    Glucose, capillary     Status: Abnormal    Collection Time: 02/22/21  7:01 AM  Result Value Ref Range    Glucose-Capillary 110 (H) 70 - 99 mg/dL      Comment: Glucose reference range applies only to samples taken after fasting for at least 8 hours.  CBC with Differential/Platelet     Status: Abnormal    Collection Time: 02/22/21  8:20 AM  Result Value Ref Range    WBC 9.6 4.0 - 10.5 K/uL    RBC 2.89 (L) 4.22 - 5.81 MIL/uL    Hemoglobin 8.5 (L) 13.0 - 17.0 g/dL    HCT 24.6 (L) 39.0 - 52.0 %    MCV 85.1 80.0 - 100.0 fL    MCH 29.4 26.0 - 34.0 pg    MCHC 34.6 30.0 - 36.0 g/dL    RDW 15.8 (H) 11.5 - 15.5 %     Platelets 329 150 - 400 K/uL    nRBC 0.0 0.0 - 0.2 %    Neutrophils Relative % 74 %    Neutro Abs 7.0 1.7 - 7.7 K/uL    Lymphocytes Relative 14 %    Lymphs Abs 1.3 0.7 - 4.0 K/uL    Monocytes Relative 11 %    Monocytes Absolute 1.1 (H)  0.1 - 1.0 K/uL    Eosinophils Relative 1 %    Eosinophils Absolute 0.1 0.0 - 0.5 K/uL    Basophils Relative 0 %    Basophils Absolute 0.0 0.0 - 0.1 K/uL    Immature Granulocytes 0 %    Abs Immature Granulocytes 0.03 0.00 - 0.07 K/uL      Comment: Performed at [facility] [address] [place], [place] [address]  Basic metabolic panel     Status: Abnormal    Collection Time: 02/22/21  8:20 AM  Result Value Ref Range    Sodium 140 135 - 145 mmol/L    Potassium 3.8 3.5 - 5.1 mmol/L    Chloride 110 98 - 111 mmol/L    CO2 24 22 - 32 mmol/L    Glucose, Bld 119 (H) 70 - 99 mg/dL      Comment: Glucose reference range applies only to samples taken after fasting for at least 8 hours.    BUN 13 8 - 23 mg/dL    Creatinine, Ser 0.87 0.61 - 1.24 mg/dL    Calcium 8.6 (L) 8.9 - 10.3 mg/dL    GFR, Estimated >60 >60 mL/min      Comment: (NOTE) Calculated using the CKD-EPI Creatinine Equation (2021)      Anion gap 6 5 - 15      Comment: Performed at [facility] [address], [place], [place] [address]  Glucose, capillary     Status: Abnormal    Collection Time: 02/22/21 11:50 AM  Result Value Ref Range    Glucose-Capillary 125 (H) 70 - 99 mg/dL      Comment: Glucose reference range applies only to samples taken after fasting for at least 8 hours.  Glucose, capillary     Status: Abnormal    Collection Time: 02/22/21  4:11 PM  Result Value Ref Range    Glucose-Capillary 144 (H) 70 - 99 mg/dL      Comment: Glucose reference range applies only to samples taken after fasting for at least 8 hours.  Glucose, capillary     Status: Abnormal    Collection Time: 02/22/21  8:47 PM  Result Value Ref Range    Glucose-Capillary 116 (H) 70 - 99 mg/dL       Comment: Glucose reference range applies only to samples taken after fasting for at least 8 hours.    Comment 1 Notify RN      Comment 2 Document in Chart    Creatinine, serum     Status: None    Collection Time: 02/23/21  4:51 AM  Result Value Ref Range    Creatinine, Ser 0.96 0.61 - 1.24 mg/dL    GFR, Estimated >60 >60 mL/min      Comment: (NOTE) Calculated using the CKD-EPI Creatinine Equation (2021) Performed at [facility] [address], [place],  [address]        Imaging Results (Last 48 hours)  No results found.           Medical Problem List and Plan: 1. 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                -patient may shower if LLE is covered             -ELOS/Goals: 10-12 days, supervision to min assist goals with PT, OT 2.  Antithrombotics: -DVT/anticoagulation:  Pharmaceutical: Lovenox             -antiplatelet therapy: Aspirin 81 mg daily and Plavix 25 mg daily 3. Pain Management: Oxycodone/Robaxin as needed 4. Mood: Zoloft 150 mg daily, trazodone 100 mg nightly             -antipsychotic agents: Latuda 20 mg daily 5. Neuropsych: This patient is capable of making decisions on his own behalf. 6. Skin/Wound Care:  continue vac per surgery             -add eucerin cream for RLE 7. Fluids/Electrolytes/Nutrition: Routine in and outs with follow-up chemistries 8.  Acute blood loss anemia.  Follow-up CBC 9.  Diabetes mellitus.  Hemoglobin A1c 6.1.  Semglee 10 units nightly.  Check blood sugars before meals and at bedtime.  Diabetic teaching 10.  Hypertension.  Norvasc 10 mg daily.  Monitor with increased mobility 11.  Hyperlipidemia.  Lipitor 12.  History of CVA with residual left-sided weakness.  Continue aspirin and Plavix 13.  Morbid obesity.  BMI 37.74.  Dietary follow-up 14.  Constipation.  MiraLAX  twice daily as needed, Senokot S1 tab twice daily as needed             -no BM since 12/11             -ordered sorbitol for tonight       Cathlyn Parsons, PA-C 02/23/2021   I have personally performed a face to face diagnostic evaluation of this patient and formulated the key components of the plan.  Additionally, I have personally reviewed laboratory data, imaging studies, as well as relevant notes and concur with the physician assistant's documentation above.  The patient's status has not changed from the original H&P.  Any changes in documentation from the acute care chart have been noted above.  Meredith Staggers, MD, [facility]

## 2021-02-23 NOTE — Progress Notes (Addendum)
Progress Note    02/23/2021 8:33 AM 4 Days Post-Op  Subjective:  no major complaints   Vitals:   02/22/21 2345 02/23/21 0420  BP: 119/83 124/84  Pulse: 83 86  Resp: 18 18  Temp: 98.5 F (36.9 C) 97.9 F (36.6 C)  SpO2: 95% 97%   Physical Exam: Cardiac:  regular Lungs:  non labored Incisions:  left TMA well appearing, Viable flaps. New dressings applied Extremities:  left lateroposterior leg wound VAC changed. Myriad wound matrix on wound. Wound well appearing. VAC reapplied Neurologic: alert and oriented  CBC    Component Value Date/Time   WBC 9.6 02/22/2021 0820   RBC 2.89 (L) 02/22/2021 0820   HGB 8.5 (L) 02/22/2021 0820   HCT 24.6 (L) 02/22/2021 0820   PLT 329 02/22/2021 0820   MCV 85.1 02/22/2021 0820   MCH 29.4 02/22/2021 0820   MCHC 34.6 02/22/2021 0820   RDW 15.8 (H) 02/22/2021 0820   LYMPHSABS 1.3 02/22/2021 0820   MONOABS 1.1 (H) 02/22/2021 0820   EOSABS 0.1 02/22/2021 0820   BASOSABS 0.0 02/22/2021 0820    BMET    Component Value Date/Time   NA 140 02/22/2021 0820   K 3.8 02/22/2021 0820   CL 110 02/22/2021 0820   CO2 24 02/22/2021 0820   GLUCOSE 119 (H) 02/22/2021 0820   BUN 13 02/22/2021 0820   CREATININE 0.96 02/23/2021 0451   CALCIUM 8.6 (L) 02/22/2021 0820   GFRNONAA >60 02/23/2021 0451   GFRAA >60 07/06/2019 0407   GFRAA >60 07/06/2019 0407    INR    Component Value Date/Time   INR 1.1 02/14/2021 1344     Intake/Output Summary (Last 24 hours) at 02/23/2021 0833 Last data filed at 02/23/2021 0421 Gross per 24 hour  Intake 600 ml  Output 1800 ml  Net -1200 ml     Assessment/Plan:  65 y.o. male is s/p s/p L pop angioplasty, TMA, wound debridement with skin substitute  4 Days Post-Op   L foot well perfused with DP by doppler TMA is well appearing, dressing changed; ambulate with darco shoe; heel weightbearing only VAC to left leg changed with [facility] nurse today. Continue vac over myriad to left lateral leg Plan is now CIR.  Wound Care will plan for 3x/ week VAC change starting on 12/19   Karoline Caldwell, PA-C [facility] [phone] 02/23/2021 8:33 AM  VASCULAR STAFF ADDENDUM: I agree with the above.   Yevonne Aline. Stanford Breed, MD [facility] Phone Number: [phone] 02/23/2021 12:01 PM

## 2021-02-23 NOTE — Care Management Important Message (Signed)
Important Message  Patient Details  Name: Adam Perez MRN: [id number] Date of Birth: September 14, 1955   Medicare Important Message Given:  Yes     Renie Ora 02/23/2021, 9:46 AM

## 2021-02-23 NOTE — Discharge Instructions (Signed)
  [facility]  Discharge Instructions  Lower Extremity Angiogram; Angioplasty/Stenting  Please refer to the following instructions for your post-procedure care. Your surgeon or physician assistant will discuss any changes with you.  Activity  Avoid lifting more than 8 pounds (1 gallons of milk) for 5 days after your procedure. You may walk as much as you can tolerate. It's OK to drive after 72 hours.  Bathing/Showering  You may shower the day after your procedure. If you have a bandage, you may remove it at 24- 48 hours. Clean your incision site with mild soap and water. Pat the area dry with a clean towel.  Diet  Resume your pre-procedure diet. There are no special food restrictions following this procedure. All patients with peripheral vascular disease should follow a low fat/low cholesterol diet. In order to heal from your surgery, it is CRITICAL to get adequate nutrition. Your body requires vitamins, minerals, and protein. Vegetables are the best source of vitamins and minerals. Vegetables also provide the perfect balance of protein. Processed food has little nutritional value, so try to avoid this.  Medications  Resume taking all of your medications unless your doctor tells you not to. If your incision is causing pain, you may take over-the-counter pain relievers such as acetaminophen (Tylenol)  Follow Up  Follow up will be arranged at the time of your procedure. You may have an office visit scheduled or may be scheduled for surgery. Ask your surgeon if you have any questions.  Please call us immediately for any of the following conditions: .Severe or worsening pain your legs or feet at rest or with walking. .Increased pain, redness, drainage at your groin puncture site. .Fever of 101 degrees or higher. .If you have any mild or slow bleeding from your puncture site: lie down, apply firm constant pressure over the area with a piece of gauze or a  clean wash cloth for 30 minutes- no peeking!, call 911 right away if you are still bleeding after 30 minutes, or if the bleeding is heavy and unmanageable.  Reduce your risk factors of vascular disease:  . Stop smoking. If you would like help call [facility] at 1-800-QUIT-NOW (1-800-784-8669) or [place] at 336-586-4000. . Manage your cholesterol . Maintain a desired weight . Control your diabetes . Keep your blood pressure down .  If you have any questions, please call the office at [phone] 

## 2021-02-23 NOTE — Progress Notes (Signed)
Report called to receiving RN in 4MW5.  Pt's belongings packed and sent with patient via LPN from to CIR.  Telemetry monitor removed and CCMD notified.

## 2021-02-23 NOTE — Progress Notes (Signed)
Inpatient Rehabilitation Admission Medication Review by a Pharmacist  A complete drug regimen review was completed for this patient to identify any potential clinically significant medication issues.  High Risk Drug Classes Is patient taking? Indication by Medication  Antipsychotic Yes Latuda - PTSD  Anticoagulant Yes Lovenox for DVT px  Antibiotic No   Opioid Yes Percocet pain  Antiplatelet Yes ASA/plavix CVA  Hypoglycemics/insulin Yes SSI/Semglee - DM  Vasoactive Medication Yes Amlodipine HTN  Chemotherapy No   Other Yes Atorvastatin - HLD Robaxin - muscle spasms Sertaline/trazodone - depression     Type of Medication Issue Identified Description of Issue Recommendation(s)  Drug Interaction(s) (clinically significant)     Duplicate Therapy     Allergy     No Medication Administration End Date     Incorrect Dose     Additional Drug Therapy Needed  APAP, baclofen, glimepiride, lisinopril/hctz, metformin Message provider in AM  Significant med changes from prior encounter (inform family/care partners about these prior to discharge).    Other       Clinically significant medication issues were identified that warrant physician communication and completion of prescribed/recommended actions by midnight of the next day:  Yes  Name of provider notified for urgent issues identified: Oncall MD for the weekend  Provider Method of Notification:     Pharmacist comments:   Time spent performing this drug regimen review (minutes):  20   Ulyses Southward, PharmD, [place], AAHIVP, CPP Infectious Disease Pharmacist 02/23/2021 5:34 PM

## 2021-02-23 NOTE — Progress Notes (Signed)
Inpatient Rehabilitation Admissions Coordinator    CIR bed is available today and patient is in agreement to admit., I have contacted acute tem and TOC and will make the arrangements to admit today,  Ottie Glazier, RN, MSN Rehab Admissions Coordinator [phone] 02/23/2021 10:57 AM

## 2021-02-23 NOTE — Consult Note (Signed)
WOC Nurse Consult Note: Patient receiving care in [place], PA-C present for first vac change.  Reason for Consult: Wound vac dressing change Wound type: Surgical LLE  Pressure Injury POA: NA Measurement: 11 cm x 12 cm Wound bed: Beefy red with myriad skin substitute Drainage (amount, consistency, odor) Serosanguinous in canister Periwound: Chronic venous disease with hardened LE skin Dressing procedure/placement/frequency: Orders placed as follows: Leave green myriad skin substitute in place. Apply adaptic over substitute followed by, hydrogel, then black foam Today we were not able to save 100% of the Myriad skin substitute due to sticking to the foam dressing. We then applied Curad non-stick oil emulsion dressing (comparable to adaptic) then applied a coat of gel before applying the black foam. The drape had to be wrapped around the leg due to the hardened skin making it very difficult for the drape to stick. Attempted skin barrier wipes on the surrounding skin but the drape still wound not stick. Immediate seal was obtained at 125 mmHg. The track pad was placed up and an ace wrap was applied around the tubing to prevent pressure on the leg or the patient tripping over the tubing. Needs to be premedicated prior to change.  WOC will follow MWF. Please re-consult for any other needs.   Cathlean Marseilles Tamala Julian, MSN, RN, [place], Lysle Pearl, [facility] Wound Treatment Associate Pager [phone]

## 2021-02-24 LAB — GLUCOSE, CAPILLARY
Glucose-Capillary: 114 mg/dL — ABNORMAL HIGH (ref 70–99)
Glucose-Capillary: 109 mg/dL — ABNORMAL HIGH (ref 70–99)
Glucose-Capillary: 106 mg/dL — ABNORMAL HIGH (ref 70–99)
Glucose-Capillary: 148 mg/dL — ABNORMAL HIGH (ref 70–99)

## 2021-02-24 MED ORDER — JUVEN PO PACK
1.0000 | PACK | Freq: Two times a day (BID) | ORAL | Status: DC
  Administered 2021-02-24 – 2021-03-06 (×20): 1 via ORAL
  Filled 2021-02-24 (×21): qty 1

## 2021-02-24 NOTE — Plan of Care (Deleted)
°  Problem: RH Balance Goal: LTG Patient will maintain dynamic standing with ADLs (OT) Description: LTG:  Patient will maintain dynamic standing balance with assist during activities of daily living (OT)  Flowsheets (Taken 02/24/2021 1138) LTG: Pt will maintain dynamic standing balance during ADLs with: Supervision/Verbal cueing   Problem: Sit to Stand Goal: LTG:  Patient will perform sit to stand in prep for activites of daily living with assistance level (OT) Description: LTG:  Patient will perform sit to stand in prep for activites of daily living with assistance level (OT) Flowsheets (Taken 02/24/2021 1138) LTG: PT will perform sit to stand in prep for activites of daily living with assistance level: Supervision/Verbal cueing   Problem: RH Grooming Goal: LTG Patient will perform grooming w/assist,cues/equip (OT) Description: LTG: Patient will perform grooming with assist, with/without cues using equipment (OT) Flowsheets (Taken 02/24/2021 1138) LTG: Pt will perform grooming with assistance level of: Independent with assistive device    Problem: RH Bathing Goal: LTG Patient will bathe all body parts with assist levels (OT) Description: LTG: Patient will bathe all body parts with assist levels (OT) Flowsheets (Taken 02/24/2021 1138) LTG: Pt will perform bathing with assistance level/cueing: Supervision/Verbal cueing   Problem: RH Toileting Goal: LTG Patient will perform toileting task (3/3 steps) with assistance level (OT) Description: LTG: Patient will perform toileting task (3/3 steps) with assistance level (OT)  Flowsheets (Taken 02/24/2021 1138) LTG: Pt will perform toileting task (3/3 steps) with assistance level: Independent with assistive device   Problem: RH Toilet Transfers Goal: LTG Patient will perform toilet transfers w/assist (OT) Description: LTG: Patient will perform toilet transfers with assist, with/without cues using equipment (OT) Flowsheets (Taken 02/24/2021  1138) LTG: Pt will perform toilet transfers with assistance level of: Supervision/Verbal cueing   Problem: RH Tub/Shower Transfers Goal: LTG Patient will perform tub/shower transfers w/assist (OT) Description: LTG: Patient will perform tub/shower transfers with assist, with/without cues using equipment (OT) Flowsheets (Taken 02/24/2021 1138) LTG: Pt will perform tub/shower stall transfers with assistance level of: Supervision/Verbal cueing

## 2021-02-24 NOTE — Plan of Care (Signed)
°  Problem: RH Balance Goal: LTG Patient will maintain dynamic sitting balance (PT) Description: LTG:  Patient will maintain dynamic sitting balance with assistance during mobility activities (PT) Flowsheets (Taken 02/24/2021 1349) LTG: Pt will maintain dynamic sitting balance during mobility activities with:: Independent with assistive device  Goal: LTG Patient will maintain dynamic standing balance (PT) Description: LTG:  Patient will maintain dynamic standing balance with assistance during mobility activities (PT) Flowsheets (Taken 02/24/2021 1349) LTG: Pt will maintain dynamic standing balance during mobility activities with:: Minimal Assistance - Patient > 75%   Problem: RH Bed Mobility Goal: LTG Patient will perform bed mobility with assist (PT) Description: LTG: Patient will perform bed mobility with assistance, with/without cues (PT). Flowsheets (Taken 02/24/2021 1349) LTG: Pt will perform bed mobility with assistance level of: Independent with assistive device    Problem: RH Bed to Chair Transfers Goal: LTG Patient will perform bed/chair transfers w/assist (PT) Description: LTG: Patient will perform bed to chair transfers with assistance (PT). Flowsheets (Taken 02/24/2021 1349) LTG: Pt will perform Bed to Chair Transfers with assistance level: Contact Guard/Touching assist   Problem: RH Car Transfers Goal: LTG Patient will perform car transfers with assist (PT) Description: LTG: Patient will perform car transfers with assistance (PT). Flowsheets (Taken 02/24/2021 1349) LTG: Pt will perform car transfers with assist:: Minimal Assistance - Patient > 75%   Problem: RH Ambulation Goal: LTG Patient will ambulate in controlled environment (PT) Description: LTG: Patient will ambulate in a controlled environment, # of feet with assistance (PT). Flowsheets (Taken 02/24/2021 1349) LTG: Pt will ambulate in controlled environ  assist needed:: Moderate Assistance - Patient 50 - 74% LTG:  Ambulation distance in controlled environment: 7ft with LRAD   Problem: RH Wheelchair Mobility Goal: LTG Patient will propel w/c in controlled environment (PT) Description: LTG: Patient will propel wheelchair in controlled environment, # of feet with assist (PT) Flowsheets (Taken 02/24/2021 1349) LTG: Pt will propel w/c in controlled environ  assist needed:: Independent with assistive device LTG: Propel w/c distance in controlled environment: 184ft Goal: LTG Patient will propel w/c in home environment (PT) Description: LTG: Patient will propel wheelchair in home environment, # of feet with assistance (PT). Flowsheets (Taken 02/24/2021 1349) LTG: Pt will propel w/c in home environ  assist needed:: Independent with assistive device Distance: wheelchair distance in controlled environment: 50

## 2021-02-24 NOTE — Evaluation (Signed)
Occupational Therapy Assessment and Plan  Patient Details  Name: Adam Perez MRN: [id number] Date of Birth: 09/16/1955  OT Diagnosis: abnormal posture, acute pain, cognitive deficits, hemiplegia affecting dominant side, muscle weakness (generalized), and decreased postural control, safety awareness, dynamic standing balance, difficulty adhering to LLE PWB precautions Rehab Potential: Rehab Potential (ACUTE ONLY): Good ELOS: 13 - 15 days   Today's Date: 02/24/2021 OT Individual Time: 3716-9678 OT Individual Time Calculation (min): 68 min     Hospital Problem: Principal Problem:   History of transmetatarsal amputation of left foot ([place])   Past Medical History:  Past Medical History:  Diagnosis Date   Diabetes mellitus without complication ([place])    Hemiparesis ([place])    left side; residual from prior acute ischemic CVA   HTN (hypertension)    Ischemic cerebrovascular accident (CVA) ([place])    with residual left hemiparesis   Past Surgical History:  Past Surgical History:  Procedure Laterality Date   ANGIOPLASTY Left 02/19/2021   Procedure: POPLITEAL ANGIOPLASTY;  Surgeon: Cherre Robins, MD;  Location: [place];  Service: Vascular;  Laterality: Left;   LOWER EXTREMITY ANGIOGRAM Left 02/19/2021   Procedure: LEFT LOWER EXTREMITY ANGIOGRAM;  Surgeon: Cherre Robins, MD;  Location: [place];  Service: Vascular;  Laterality: Left;   LOWER EXTREMITY ANGIOGRAPHY N/A 02/15/2021   Procedure: LOWER EXTREMITY ANGIOGRAPHY;  Surgeon: Cherre Robins, MD;  Location: [place] [facility];  Service: Cardiovascular;  Laterality: N/A;   TRANSMETATARSAL AMPUTATION Left 02/19/2021   Procedure: LEFT TRANSMETATARSAL AMPUTATION;  Surgeon: Cherre Robins, MD;  Location: [facility] OR;  Service: Vascular;  Laterality: Left;   ULTRASOUND GUIDANCE FOR VASCULAR ACCESS Right 02/19/2021   Procedure: ULTRASOUND GUIDANCE FOR RIGHT COMMON FEMORAL ARTERY ACCESS;  Surgeon: Cherre Robins, MD;  Location: [facility] OR;  Service:  Vascular;  Laterality: Right;   WOUND DEBRIDEMENT Left 02/19/2021   Procedure: LEFT CALF VENOUS ULCER SHARP EXCISIONAL DEBRIDEMENT;  Surgeon: Cherre Robins, MD;  Location: [facility] OR;  Service: Vascular;  Laterality: Left;    Assessment & Plan Clinical Impression: Patient is a 65 y.o. year old male with history of hypertension, CVA with left-sided residual weakness maintained on aspirin and Plavix, diabetes mellitus, obesity with BMI 37.74.  Per chart review patient currently separated from his wife lives alone with plans to stay with his wife on discharge.  1 level home with level entry.  Modified independent prior to admission.  Presented 02/14/2021 with left side hip pain and malodorous wounds in the lower extremities including the toes of the left foot.  Reports the wound has been progressing over the last 2 months and is followed at the [facility] clinic in [place].  Admission chemistry potassium 5.2 glucose 122 BUN 26 creatinine 1.31, hemoglobin 10.7, lactic acid 2.5, blood cultures no growth to date, sedimentation rate 76.  MRI of the left foot showed marked subcutaneous soft tissue swelling edema fluid consistent with cellulitis.  Osteomyelitis involving the entire second toe and distal phalanx of the third toe.  MRI of the left hip mild bilateral hip degenerative changes but no stress fracture or AVN.  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 Jamelle Haring and wound VAC applied.  Partial weightbearing left lower extremity through heel only with Darco shoe.  Maintained on Lovenox for DVT prophylaxis.  His chronic aspirin and Plavix have been resumed.  Acute blood loss anemia 8.5 and monitored.  Therapy evaluations completed due to patient decreased functional  mobility was admitted for a comprehensive rehab program Patient transferred to CIR on 02/23/2021 .    Patient currently requires mod with basic self-care skills  secondary to muscle weakness, decreased cardiorespiratoy endurance, impaired timing and sequencing, abnormal tone, unbalanced muscle activation, and decreased coordination, decreased awareness and decreased safety awareness, and decreased sitting balance, decreased standing balance, decreased postural control, hemiplegia, and decreased balance strategies.  Prior to hospitalization, patient could complete BADL/IADL/mobility with modified independent .  Patient will benefit from skilled intervention to decrease level of assist with basic self-care skills and increase independence with basic self-care skills prior to discharge home with care partner.  Anticipate patient will require intermittent supervision and follow up home health and follow up outpatient.  OT - End of Session Activity Tolerance: Tolerates 30+ min activity with multiple rests Endurance Deficit: Yes Endurance Deficit Description: requires increased time and to complete ADL seated OT Assessment Rehab Potential (ACUTE ONLY): Good OT Barriers to Discharge: Decreased caregiver support;Wound Care OT Patient demonstrates impairments in the following area(s): Balance;Sensory;Skin Integrity;Cognition;Endurance;Motor;Pain;Safety OT Basic ADL's Functional Problem(s): Grooming;Bathing;Dressing;Toileting OT Transfers Functional Problem(s): Toilet;Tub/Shower OT Additional Impairment(s): None OT Plan OT Intensity: Minimum of 1-2 x/day, 45 to 90 minutes OT Frequency: 5 out of 7 days OT Duration/Estimated Length of Stay: 13 - 15 days OT Treatment/Interventions: Balance/vestibular training;Disease mangement/prevention;Neuromuscular re-education;Self Care/advanced ADL retraining;Therapeutic Exercise;Wheelchair propulsion/positioning;UE/LE Strength taining/ROM;Skin care/wound managment;Pain management;DME/adaptive equipment instruction;Cognitive remediation/compensation;Community reintegration;Functional electrical stimulation;Patient/family  education;Splinting/orthotics;UE/LE Coordination activities;Visual/perceptual remediation/compensation;Therapeutic Activities;Psychosocial support;Functional mobility training;Discharge planning OT Self Feeding Anticipated Outcome(s): set-up A OT Basic Self-Care Anticipated Outcome(s): mod I OT Toileting Anticipated Outcome(s): mod I OT Bathroom Transfers Anticipated Outcome(s): S OT Recommendation Patient destination: Home Follow Up Recommendations: Home health OT;Outpatient OT Equipment Recommended: To be determined   OT Evaluation Precautions/Restrictions  Precautions Precautions: Fall Precaution Comments: L sided hemi/spasticity from prior CVA Required Braces or Orthoses: Other Brace Other Brace: L darco Restrictions Weight Bearing Restrictions: Yes LLE Weight Bearing: Partial weight bearing LLE Partial Weight Bearing Percentage or Pounds: WB through heel only General Chart Reviewed: Yes Response to Previous Treatment: Not applicable Family/Caregiver Present: No Vital Signs   Pain Pain Assessment Pain Scale: 0-10 Pain Score: 0-No pain Pain Type: Surgical pain Pain Location: Leg Pain Orientation: Left Pain Descriptors / Indicators: Aching Pain Frequency: Intermittent Pain Onset: Gradual Patients Stated Pain Goal: 2 Pain Intervention(s): Medication (See eMAR) Home Living/Prior Functioning Home Living Family/patient expects to be discharged to:: Private residence Living Arrangements: Alone Available Help at Discharge: Family, Available PRN/intermittently (wife (separated) to move in with pt upon DC) Type of Home: House Home Access: Level entry Home Layout: One level Bathroom Shower/Tub: Chiropodist: Standard Bathroom Accessibility: Yes Additional Comments: Pt lives alone but has family nearby if he needs help. Says the [place] is going to help him install a walk in shower.  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, Clinical biochemist Leisure and Hobbies: walking through neighborhood Prior Function Level of Independence: Requires assistive device for independence (quad cane)  Able to Take Stairs?: Yes Driving: Yes Vocation Requirements: CVA 5 years prior Vision Baseline Vision/History: 1 Wears glasses (readers) Ability to See in Adequate Light: 0 Adequate Patient Visual Report: No change from baseline Vision Assessment?: No apparent visual deficits Perception  Perception: Within Functional Limits Praxis Praxis: Intact Cognition Overall Cognitive Status: No family/caregiver present to determine baseline cognitive functioning  Arousal/Alertness: Awake/alert Orientation Level: Person;Place;Situation Person: Oriented Place: Oriented Situation: Oriented Year: 2022 Month: December Day of Week: Correct Memory: Impaired (difficulty recalling WB precautions when initially asked) Immediate Memory Recall: Sock;Blue;Bed Memory Recall Sock: Without Cue Memory Recall Blue: Without Cue Memory Recall Bed: Without Cue Awareness: Impaired Awareness Impairment: Intellectual impairment Problem Solving: Appears intact Safety/Judgment: Impaired (difficulty adhering / recalling WB precautions, stating he "can walk" to bathroom despite not having attempted yet with therapy) Sensation Sensation Light Touch: Impaired Detail Peripheral sensation comments: 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 Hot/Cold: Appears Intact Proprioception: Impaired by gross assessment Stereognosis: Impaired by gross assessment Additional Comments: prior L hemi Coordination Gross Motor Movements are Fluid and Coordinated: No Fine Motor Movements are Fluid and Coordinated: No Coordination and Movement  Description: 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 Finger Nose Finger Test: decreased speed and ROM on the L side from premorbid weakess Heel Shin Test: unable to complete on the LLE due to flexor tone and hemiplegia Motor  Motor Motor: Hemiplegia Motor - Skilled Clinical Observations: baseline L sided hemiplegia and LLE flexor tone  Trunk/Postural Assessment  Cervical Assessment Cervical Assessment: Exceptions to [facility] (forward head) Thoracic Assessment Thoracic Assessment: Exceptions to [facility] (rounded shoulders) Lumbar Assessment Lumbar Assessment: Exceptions to [facility] (posterior pelvic tilt in sitting) Postural Control Postural Control: Deficits on evaluation (heavy reliance on RUE support in standing)  Balance Balance Balance Assessed: Yes Static Sitting Balance Static Sitting - Balance Support: Feet supported;Right upper extremity supported Static Sitting - Level of Assistance: 5: Stand by assistance Dynamic Sitting Balance Dynamic Sitting - Balance Support: Feet unsupported;No upper extremity supported Dynamic Sitting - Level of Assistance: 5: Stand by assistance Static Standing Balance Static Standing - Balance Support: Right upper extremity supported Static Standing - Level of Assistance: 4: Min assist Extremity/Trunk Assessment RUE Assessment RUE Assessment: Within Functional Limits LUE Assessment LUE Assessment: Exceptions to [facility] Active Range of Motion (AROM) Comments: >90 degrees in shoulder flexion General Strength Comments: 4-/5 in shoulder flexion LUE Body System: Neuro Brunstrum levels for arm and hand: Arm;Hand Brunstrum level for arm: Stage III Synergy is performed voluntarily Brunstrum level for hand: Stage IV Movements deviating from synergies  Care Tool Care Tool Self Care Eating   Eating Assist Level: Set up assist    Oral Care    Oral Care Assist Level: Supervision/Verbal cueing (EOB)    Bathing   Body parts bathed by patient: Right arm;Left  arm;Chest;Abdomen;Front perineal area;Right upper leg;Left upper leg Body parts bathed by helper: Buttocks;Right lower leg Body parts n/a: Left lower leg Assist Level: Moderate Assistance - Patient 50 - 74%    Upper Body Dressing(including orthotics)   What is the patient wearing?: Hospital gown only;Pull over shirt   Assist Level: Supervision/Verbal cueing (EOB)    Lower Body Dressing (excluding footwear)   What is the patient wearing?: Pants Assist for lower body dressing: Total Assistance - Patient < 25%    Putting on/Taking off footwear   What is the patient wearing?: Non-skid slipper socks;Ted hose;Orthosis Assist for footwear: Total Assistance - Patient < 25%       Care Tool Toileting Toileting activity   Assist for toileting: Dependent - Patient 0% (stedy)     Care Tool Bed Mobility Roll left and right activity        Sit to lying activity        Lying to sitting on side of bed activity   Lying to sitting on side  of bed assist level: the ability to move from lying on the back to sitting on the side of the bed with no back support.: Moderate Assistance - Patient 50 - 74%     Care Tool Transfers Sit to stand transfer   Sit to stand assist level: Moderate Assistance - Patient 50 - 74%    Chair/bed transfer   Chair/bed transfer assist level: Dependent - Patient 0% (stedy)     Toilet transfer   Assist Level: Dependent - Patient 0% (stedy)     Care Tool Cognition  Expression of Ideas and Wants Expression of Ideas and Wants: 4. Without difficulty (complex and basic) - expresses complex messages without difficulty and with speech that is clear and easy to understand  Understanding Verbal and Non-Verbal Content Understanding Verbal and Non-Verbal Content: 3. Usually understands - understands most conversations, but misses some part/intent of message. Requires cues at times to understand   Memory/Recall Ability Memory/Recall Ability : Current season;That he or she is  in a hospital/hospital unit   Refer to Care Plan for [place] 1 OT Short Term Goal 1 (Week 1): Pt will independtly recall LLE PWB restrictions. OT Short Term Goal 2 (Week 1): Pt will don pants with max A + AE PRN. OT Short Term Goal 3 (Week 1): Pt will complete toilet transfer with max A and LRAD. OT Short Term Goal 4 (Week 1): Pt will complete standing grooming task with no more than min cues to adhere to LLE PWB precautions.  Recommendations for other services: None    Skilled Therapeutic Intervention ADL ADL Eating: Set up Where Assessed-Eating: Bed level;Wheelchair Grooming: Supervision/safety Where Assessed-Grooming: Edge of bed Upper Body Bathing: Supervision/safety Where Assessed-Upper Body Bathing: Edge of bed Lower Body Bathing: Moderate assistance Where Assessed-Lower Body Bathing: Edge of bed Upper Body Dressing: Supervision/safety Where Assessed-Upper Body Dressing: Edge of bed Lower Body Dressing: Maximal assistance Where Assessed-Lower Body Dressing: Edge of bed Toileting: Dependent (dependent in stedy) Where Assessed-Toileting: Toilet;Bedside Commode Toilet Transfer: Dependent (stedy) Toilet Transfer Method: Other (comment) (stedy) Toilet Transfer Equipment: Bedside commode;Grab bars Tub/Shower Transfer: Not assessed Social research officer, government: Not assessed Mobility  Bed Mobility Bed Mobility: Supine to Sit Supine to Sit: Moderate Assistance - Patient 50-74% Transfers Sit to Stand: Moderate Assistance - Patient 50-74% Stand to Sit: Minimal Assistance - Patient > 75%  Session Note: Pt received awake semi-reclined in bed, agreeable to OT eval, reporting 4/10 pain in L hip but declines intervention. Reviewed role of CIR OT, evaluation process, ADL/func mobility retraining, goals for therapy, and safety plan. Evaluation completed as documented above. Came to sitting EOB with mod A to lift trunk and progress hemi body off bed. Maintained  static sitting with close S and BUE support. Total A to don R ted/B socks/and L darco boot. Pt unable to verbalize LLE PWB precautions when asked, but able to later recall "not to put weight on it." Cues throughout session to adhere to precautions with fair adherence. Completed UB bathing EOB with S for balance, LB bathing with mod A for buttocks/feet. Donned shirt with S for balance and total A to don shorts and manage wound vac. Able to stand from elevated surface with RUE support on bed rail and min A to power up, but unable to come into fully erect posture. Requesting to use bathroom, utilized stedy due to urgency and poor safety awareness as pt reporting that he can walk to the bathroom despite having not  yet ambulated in therapy/with nursing. Pt able to complete pericare and LB clothing management with overall CGA in stedy and cues for LLE PWB. Stedy transfer > w/c with only CGA to power up. Pt left seated in w/c with chair alarm engaged, call bell in reach, and all immediate needs met.  Discharge Criteria: Patient will be discharged from OT if patient refuses treatment 3 consecutive times without medical reason, if treatment goals not met, if there is a change in medical status, if patient makes no progress towards goals or if patient is discharged from hospital.  The above assessment, treatment plan, treatment alternatives and goals were discussed and mutually agreed upon: by patient  Volanda Napoleon MS, OTR/L  02/24/2021, 11:58 AM

## 2021-02-24 NOTE — Plan of Care (Signed)
°  Problem: RH Balance Goal: LTG Patient will maintain dynamic standing with ADLs (OT) Description: LTG:  Patient will maintain dynamic standing balance with assist during activities of daily living (OT)  02/24/2021 1226 by Claudie Revering, OT Flowsheets (Taken 02/24/2021 1226) LTG: Pt will maintain dynamic standing balance during ADLs with: Contact Guard/Touching assist 02/24/2021 1138 by Claudie Revering, OT Flowsheets (Taken 02/24/2021 1138) LTG: Pt will maintain dynamic standing balance during ADLs with: Supervision/Verbal cueing   Problem: Sit to Stand Goal: LTG:  Patient will perform sit to stand in prep for activites of daily living with assistance level (OT) Description: LTG:  Patient will perform sit to stand in prep for activites of daily living with assistance level (OT) 02/24/2021 1226 by Claudie Revering, OT Flowsheets (Taken 02/24/2021 1226) LTG: PT will perform sit to stand in prep for activites of daily living with assistance level: Contact Guard/Touching assist 02/24/2021 1138 by Claudie Revering, OT Flowsheets (Taken 02/24/2021 1138) LTG: PT will perform sit to stand in prep for activites of daily living with assistance level: Supervision/Verbal cueing   Problem: RH Grooming Goal: LTG Patient will perform grooming w/assist,cues/equip (OT) Description: LTG: Patient will perform grooming with assist, with/without cues using equipment (OT) Flowsheets (Taken 02/24/2021 1138) LTG: Pt will perform grooming with assistance level of: Independent with assistive device    Problem: RH Bathing Goal: LTG Patient will bathe all body parts with assist levels (OT) Description: LTG: Patient will bathe all body parts with assist levels (OT) 02/24/2021 1226 by Claudie Revering, OT Flowsheets (Taken 02/24/2021 1226) LTG: Pt will perform bathing with assistance level/cueing: Contact Guard/Touching assist 02/24/2021 1138 by Claudie Revering, OT Flowsheets (Taken  02/24/2021 1138) LTG: Pt will perform bathing with assistance level/cueing: Supervision/Verbal cueing   Problem: RH Toileting Goal: LTG Patient will perform toileting task (3/3 steps) with assistance level (OT) Description: LTG: Patient will perform toileting task (3/3 steps) with assistance level (OT)  02/24/2021 1226 by Claudie Revering, OT Flowsheets (Taken 02/24/2021 1226) LTG: Pt will perform toileting task (3/3 steps) with assistance level: Contact Guard/Touching assist 02/24/2021 1138 by Claudie Revering, OT Flowsheets (Taken 02/24/2021 1138) LTG: Pt will perform toileting task (3/3 steps) with assistance level: Independent with assistive device   Problem: RH Toilet Transfers Goal: LTG Patient will perform toilet transfers w/assist (OT) Description: LTG: Patient will perform toilet transfers with assist, with/without cues using equipment (OT) 02/24/2021 1226 by Claudie Revering, OT Flowsheets (Taken 02/24/2021 1226) LTG: Pt will perform toilet transfers with assistance level of: Minimal Assistance - Patient > 75% 02/24/2021 1138 by Claudie Revering, OT Flowsheets (Taken 02/24/2021 1138) LTG: Pt will perform toilet transfers with assistance level of: Supervision/Verbal cueing   Problem: RH Tub/Shower Transfers Goal: LTG Patient will perform tub/shower transfers w/assist (OT) Description: LTG: Patient will perform tub/shower transfers with assist, with/without cues using equipment (OT) 02/24/2021 1226 by Claudie Revering, OT Flowsheets (Taken 02/24/2021 1226) LTG: Pt will perform tub/shower stall transfers with assistance level of: Minimal Assistance - Patient > 75% 02/24/2021 1138 by Claudie Revering, OT Flowsheets (Taken 02/24/2021 1138) LTG: Pt will perform tub/shower stall transfers with assistance level of: Supervision/Verbal cueing

## 2021-02-24 NOTE — Progress Notes (Signed)
Initial Nutrition Assessment  DOCUMENTATION CODES:   Not applicable  INTERVENTION:   Continue Prosource Plus 30 ml PO BID, each packet provides 100 kcal and 15 gm protein. Continue MVI with minerals daily. Change Glucerna Shakes to Juven BID, each packet provides 80 calories, 8 grams of carbohydrate, 2.5  grams of protein (collagen), 7 grams of L-arginine and 7 grams of L-glutamine; supplement contains CaHMB, Vitamins C, E, B12 and Zinc to promote wound healing  NUTRITION DIAGNOSIS:   Increased nutrient needs related to wound healing as evidenced by estimated needs.  GOAL:   Patient will meet greater than or equal to 90% of their needs  MONITOR:   PO intake, Supplement acceptance, Labs, Skin  REASON FOR ASSESSMENT:   Malnutrition Screening Tool    ASSESSMENT:   65 yo male admitted to [facility] with functional deficits secondary to recent L TMA on 02/19/2021. PMH includes CVA with residual left-sided weakness, DM, HTN  Recorded po intake 80-95% of meals. Appetite appears to have improved with resolution of constipation. Unable to reach pt via phone.   Weight down slightly post amputation  Labs: reviewed Meds: ss novolog, semglee, latuda, MVI with Minerals  NUTRITION - FOCUSED PHYSICAL EXAM: Unable to assess  Diet Order:   Diet Order             Diet regular Room service appropriate? Yes with Assist; Fluid consistency: Thin  Diet effective now                   EDUCATION NEEDS:   Education needs have been addressed  Skin:  Skin Integrity Issues:: Wound VAC Wound Vac: LLE post TMA on 12/12  Last BM:  12/15  Height:   Ht Readings from Last 1 Encounters:  02/23/21 5\' 7"  (1.702 m)    Weight:   Wt Readings from Last 1 Encounters:  02/23/21 104.3 kg    BMI:  Body mass index is 36.01 kg/m.  Estimated Nutritional Needs:   Kcal:  2100-2300 kcals  Protein:  110-130 g  Fluid:  >/= 2L    02/25/21 MS, RDN, LDN, CNSC Registered  Dietitian III Clinical Nutrition RD Pager and On-Call Pager Number Located in [place]

## 2021-02-24 NOTE — Progress Notes (Signed)
PROGRESS NOTE   Subjective/Complaints:  No issues overnite   ROS- neg CP, SOB, N/V/D  Objective:   No results found. Recent Labs    02/22/21 0820  WBC 9.6  HGB 8.5*  HCT 24.6*  PLT 329   Recent Labs    02/22/21 0820 02/23/21 0451  NA 140  --   K 3.8  --   CL 110  --   CO2 24  --   GLUCOSE 119*  --   BUN 13  --   CREATININE 0.87 0.96  CALCIUM 8.6*  --     Intake/Output Summary (Last 24 hours) at 02/24/2021 0759 Last data filed at 02/24/2021 1914 Gross per 24 hour  Intake 476 ml  Output 1525 ml  Net -1049 ml        Physical Exam: Vital Signs Blood pressure 100/72, pulse 75, temperature 98.5 F (36.9 C), temperature source Oral, resp. rate 16, height 5\' 7"  (1.702 m), weight 104.3 kg, SpO2 98 %.   General: No acute distress Mood and affect are appropriate Heart: Regular rate and rhythm no rubs murmurs or extra sounds Lungs: Clear to auscultation, breathing unlabored, no rales or wheezes Abdomen: Positive bowel sounds, soft nontender to palpation, nondistended Extremities:LLE 1+ edema Skin: No evidence of breakdown, no evidence of rash Neurologic: Cranial nerves II through XII intact, motor strength is 5/5 in Right , 3- Left deltoid, bicep, tricep, grip, hip flexor, knee extensors, L ankle dorsiflexor and plantar flexor not tested due to dressing wound vac    Musculoskeletal: Left TMA with wound vac    Assessment/Plan: 1. Functional deficits which require 3+ hours per day of interdisciplinary therapy in a comprehensive inpatient rehab setting. Physiatrist is providing close team supervision and 24 hour management of active medical problems listed below. Physiatrist and rehab team continue to assess barriers to discharge/monitor patient progress toward functional and medical goals  Care Tool:  Bathing              Bathing assist       Upper Body Dressing/Undressing Upper body dressing         Upper body assist      Lower Body Dressing/Undressing Lower body dressing            Lower body assist       Toileting Toileting    Toileting assist Assist for toileting: Independent with assistive device Assistive Device Comment: urinal   Transfers Chair/bed transfer  Transfers assist           Locomotion Ambulation   Ambulation assist              Walk 10 feet activity   Assist           Walk 50 feet activity   Assist           Walk 150 feet activity   Assist           Walk 10 feet on uneven surface  activity   Assist           Wheelchair     Assist  Wheelchair 50 feet with 2 turns activity    Assist            Wheelchair 150 feet activity     Assist          Blood pressure 100/72, pulse 75, temperature 98.5 F (36.9 C), temperature source Oral, resp. rate 16, height 5\' 7"  (1.702 m), weight 104.3 kg, SpO2 98 %.  Medical Problem List and Plan: 1. 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                -patient may shower if LLE is covered             -ELOS/Goals: 10-12 days, supervision to min assist goals with PT, OT 2.  Antithrombotics: -DVT/anticoagulation:  Pharmaceutical: Lovenox             -antiplatelet therapy: Aspirin 81 mg daily and Plavix 25 mg daily 3. Pain Management: Oxycodone/Robaxin as needed 4. Mood: Zoloft 150 mg daily, trazodone 100 mg nightly             -antipsychotic agents: Latuda 20 mg daily 5. Neuropsych: This patient is capable of making decisions on his own behalf. 6. Skin/Wound Care:  continue vac per surgery             -add eucerin cream for RLE 7. Fluids/Electrolytes/Nutrition: Routine in and outs with follow-up chemistries 8.  Acute blood loss anemia.  Follow-up CBC 9.  Diabetes mellitus.   Hemoglobin A1c 6.1.  Semglee 10 units nightly.  Check blood sugars before meals and at bedtime.  Diabetic teaching CBG (last 3)  Recent Labs    02/23/21 1716 02/23/21 2057 02/24/21 0608  GLUCAP 118* 147* 114*   Controlled 12/17 10.  Hypertension.  Norvasc 10 mg daily.  Monitor with increased mobility Vitals:   02/23/21 1947 02/24/21 0449  BP: 117/67 100/72  Pulse: 84 75  Resp: 17 16  Temp: 97.9 F (36.6 C) 98.5 F (36.9 C)  SpO2: 98% 98%    11.  Hyperlipidemia.  Lipitor 12.  History of CVA with residual left-sided weakness.  Continue aspirin and Plavix 13.  Morbid obesity.  BMI 37.74.  Dietary follow-up 14.  Constipation.  MiraLAX twice daily as needed, Senokot S1 tab twice daily as needed             -no BM since 12/11             -ordered sorbitol for tonight    LOS: 1 days A FACE TO FACE EVALUATION WAS PERFORMED  14/11 02/24/2021, 7:59 AM

## 2021-02-24 NOTE — Evaluation (Signed)
Physical Therapy Assessment and Plan ° °Patient Details  °Name: Adam Perez °MRN: [id number] °Date of Birth: 09/08/1955 ° °PT Diagnosis: Abnormal posture, Abnormality of gait, Coordination disorder, Hemiplegia non-dominant, Hypertonia, Impaired sensation, Muscle spasms, Muscle weakness, and Pain in joint °Rehab Potential: Fair °ELOS: 12-16 days  ° °Today's Date: 02/24/2021 °PT Individual Time: 1300-1354 and 1105-1200 °PT Individual Time Calculation (min): 54 min  and 1200  ° °Hospital Problem: Principal Problem: °  History of transmetatarsal amputation of left foot (HCC) ° ° °Past Medical History:  °Past Medical History:  °Diagnosis Date  ° Diabetes mellitus without complication (HCC)   ° Hemiparesis (HCC)   ° left side; residual from prior acute ischemic CVA  ° HTN (hypertension)   ° Ischemic cerebrovascular accident (CVA) (HCC)   ° with residual left hemiparesis  ° °Past Surgical History:  °Past Surgical History:  °Procedure Laterality Date  ° ANGIOPLASTY Left 02/19/2021  ° Procedure: POPLITEAL ANGIOPLASTY;  Surgeon: Hawken, Thomas N, MD;  Location: [facility] OR;  Service: Vascular;  Laterality: Left;  ° LOWER EXTREMITY ANGIOGRAM Left 02/19/2021  ° Procedure: LEFT LOWER EXTREMITY ANGIOGRAM;  Surgeon: Hawken, Thomas N, MD;  Location: [facility] OR;  Service: Vascular;  Laterality: Left;  ° LOWER EXTREMITY ANGIOGRAPHY N/A 02/15/2021  ° Procedure: LOWER EXTREMITY ANGIOGRAPHY;  Surgeon: Hawken, Thomas N, MD;  Location: [facility]ASIVE [facility];  Service: Cardiovascular;  Laterality: N/A;  ° TRANSMETATARSAL AMPUTATION Left 02/19/2021  ° Procedure: LEFT TRANSMETATARSAL AMPUTATION;  Surgeon: Hawken, Thomas N, MD;  Location: [facility] OR;  Service: Vascular;  Laterality: Left;  ° ULTRASOUND GUIDANCE FOR VASCULAR ACCESS Right 02/19/2021  ° Procedure: ULTRASOUND GUIDANCE FOR RIGHT COMMON FEMORAL ARTERY ACCESS;  Surgeon: Hawken, Thomas N, MD;  Location: [facility];  Service: Vascular;  Laterality: Right;  ° WOUND DEBRIDEMENT Left 02/19/2021  ° Procedure:  LEFT CALF VENOUS ULCER SHARP EXCISIONAL DEBRIDEMENT;  Surgeon: Hawken, Thomas N, MD;  Location: [facility];  Service: Vascular;  Laterality: Left;  ° ° °Assessment & Plan °Clinical Impression: Patient is a 65-year-old right-handed male with history of hypertension, CVA with left-sided residual weakness maintained on aspirin and Plavix, diabetes mellitus, obesity with BMI 37.74.  Per chart review patient currently separated from his wife lives alone with plans to stay with his wife on discharge.  1 level home with level entry.  Modified independent prior to admission.  Presented 02/14/2021 with left side hip pain and malodorous wounds in the lower extremities including the toes of the left foot.  Reports the wound has been progressing over the last 2 months and [facility] [place]  Admission chemistry potassium 5.2 glucose 122 BUN 26 creatinine 1.31, hemoglobin 10.7, lactic acid 2.5, blood cultures no growth to date, sedimentation rate 76.  MRI of the left foot showed marked subcutaneous soft tissue swelling edema fluid consistent with cellulitis.  Osteomyelitis involving the entire second toe and distal phalanx of the third toe.  MRI of the left hip mild bilateral hip degenerative changes but no stress fracture or AVN.  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 Thomas Hawken and wound VAC applied.  Partial weightbearing left lower extremity through heel only with Darco shoe.  Maintained on Lovenox for DVT prophylaxis.  His chronic aspirin and Plavix have been resumed.  Acute blood loss anemia 8.5 and monitored.  Patient transferred to CIR on 02/23/2021 .  ° °Patient currently requires mod with mobility secondary to muscle weakness, muscle joint   tightness, and muscle paralysis, decreased cardiorespiratoy endurance, impaired timing and sequencing, abnormal tone, motor apraxia, and decreased coordination, and  decreased sitting balance, decreased standing balance, decreased postural control, hemiplegia, decreased balance strategies, and difficulty maintaining precautions.  Prior to hospitalization, patient was modified independent  with mobility and lived with Alone in a House home.  Home access is  Level entry.  Patient will benefit from skilled PT intervention to maximize safe functional mobility, minimize fall risk, and decrease caregiver burden for planned discharge home with 24 hour supervision.  Anticipate patient will benefit from follow up [place] at discharge.  PT - End of Session Activity Tolerance: Tolerates < 10 min activity, no significant change in vital signs Endurance Deficit: Yes Endurance Deficit Description: requires increased time and to complete ADL seated PT Assessment Rehab Potential (ACUTE/IP ONLY): Fair PT Barriers to Discharge: Decreased caregiver support;Inaccessible home environment;Home environment access/layout;Weight bearing restrictions;Lack of/limited family support;Behavior PT Patient demonstrates impairments in the following area(s): Balance;Behavior;Edema;Endurance;Motor;Nutrition;Pain;Perception;Safety;Skin Integrity;Sensory PT Transfers Functional Problem(s): Bed Mobility;Bed to Chair;Car;Furniture;Floor PT Locomotion Functional Problem(s): Ambulation;Wheelchair Mobility;Stairs PT Plan PT Intensity: Minimum of 1-2 x/day ,45 to 90 minutes PT Frequency: 5 out of 7 days PT Duration Estimated Length of Stay: 12-16 days PT Treatment/Interventions: Ambulation/gait training;Balance/vestibular training;Cognitive remediation/compensation;Disease management/prevention;Discharge planning;Community reintegration;DME/adaptive equipment instruction;Functional electrical stimulation;Functional mobility training;Patient/family education;Pain management;Neuromuscular re-education;Psychosocial support;Skin care/wound management;Splinting/orthotics;Therapeutic Exercise;Therapeutic  Activities;Stair training;UE/LE Strength taining/ROM;UE/LE Coordination activities;Visual/perceptual remediation/compensation;Wheelchair propulsion/positioning PT Transfers Anticipated Outcome(s): CGA with LRAD PT Locomotion Anticipated Outcome(s): WC mobility Mod I PT Recommendation Recommendations for Other Services: Therapeutic Recreation consult Therapeutic Recreation Interventions: Stress management Follow Up Recommendations: Skilled nursing facility;Home health PT Patient destination: Home Equipment Recommended: Rolling walker with 5" wheels;Wheelchair (measurements);Wheelchair cushion (measurements)   PT Evaluation Precautions/Restrictions Precautions Precautions: Fall Precaution Comments: L sided hemi/spasticity from prior CVA Required Braces or Orthoses: Other Brace Other Brace: L darco Restrictions Weight Bearing Restrictions: Yes LLE Weight Bearing: Partial weight bearing LLE Partial Weight Bearing Percentage or Pounds: WB through heel only General   Vital SignsTherapy Vitals Temp: 97.7 F (36.5 C) Temp Source: Oral Pulse Rate: 82 Resp: 18 BP: 91/70 Patient Position (if appropriate): Sitting Oxygen Therapy SpO2: 100 % O2 Device: Room Air Pain denies Pain Interference Pain Interference Pain Effect on Sleep: 0. Does not apply - I have not had any pain or hurting in the past 5 days Pain Interference with Therapy Activities: 1. Rarely or not at all Pain Interference with Day-to-Day Activities: 1. Rarely or not at all Home Living/Prior [place] Available Help at Discharge: Family;Available PRN/intermittently (wife (separated) to move in with pt upon DC) Type of Home: House Home Access: Level entry Home Layout: One level Bathroom Shower/Tub: Chiropodist: Standard Bathroom Accessibility: Yes Additional Comments: Pt lives alone but has family nearby if he needs help. Says the [facility] is going to help him install a walk in shower.   Lives With: Alone Prior Function Level of Independence: Requires assistive device for independence (quad cane)  Able to Take Stairs?: Yes Driving: Yes Vision/Perception  Vision - History Ability to See in Adequate Light: 0 Adequate Perception Perception: Within Functional Limits Praxis Praxis: Intact  Cognition Overall Cognitive Status: No family/caregiver present to determine baseline cognitive functioning Arousal/Alertness: Awake/alert Year: 2022 Month: December Day of Week: Correct Memory: Impaired (difficulty recalling WB precautions when initially asked) Immediate Memory Recall: Sock;Blue;Bed Memory Recall Sock: Without Cue Memory Recall Blue: Without Cue Memory Recall Bed: Without Cue Awareness: Impaired Awareness Impairment: Intellectual impairment Problem Solving: Appears intact Safety/Judgment: Impaired (  difficulty adhering / recalling WB precautions, stating he "can walk" to bathroom despite not having attempted yet with therapy) °Sensation °Sensation °Light Touch: Impaired Detail °Peripheral sensation comments: 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 °Hot/Cold: Appears Intact °Proprioception: Impaired by gross assessment °Stereognosis: Impaired by gross assessment °Additional Comments: prior L hemi °Coordination °Gross Motor Movements are Fluid and Coordinated: No °Fine Motor Movements are Fluid and Coordinated: No °Coordination and Movement Description: 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 °Finger Nose Finger Test: decreased speed and ROM on the L side from premorbid weakess °Motor  °Motor °Motor: Hemiplegia °Motor - Skilled Clinical Observations: baseline L sided hemiplegia and LLE flexor tone  ° °Trunk/Postural Assessment  °Cervical Assessment °Cervical Assessment: Exceptions to WFL (forward head) °Thoracic Assessment °Thoracic Assessment: Exceptions to WFL (rounded shoulders) °Lumbar Assessment °Lumbar Assessment: Exceptions to WFL (posterior pelvic tilt in  sitting) °Postural Control °Postural Control: Deficits on evaluation (heavy reliance on RUE support in standing)  °Balance °Balance °Balance Assessed: Yes °Static Sitting Balance °Static Sitting - Balance Support: Feet supported;Right upper extremity supported °Static Sitting - Level of Assistance: 5: Stand by assistance °Dynamic Sitting Balance °Dynamic Sitting - Balance Support: Feet unsupported;No upper extremity supported °Dynamic Sitting - Level of Assistance: 5: Stand by assistance °Static Standing Balance °Static Standing - Balance Support: Right upper extremity supported °Static Standing - Level of Assistance: 4: Min assist °Extremity Assessment  °RUE Assessment °RUE Assessment: Within Functional Limits °LUE Assessment °LUE Assessment: Exceptions to WFL °Active Range of Motion (AROM) Comments: >90 degrees in shoulder flexion °General Strength Comments: 4-/5 in shoulder flexion °LUE Body System: Neuro °Brunstrum levels for arm and hand: Arm;Hand °Brunstrum level for arm: Stage III Synergy is performed voluntarily °Brunstrum level for hand: Stage IV Movements deviating from synergies °  °  ° °Care Tool °Care Tool Bed Mobility °Roll left and right activity   °Roll left and right assist level: Minimal Assistance - Patient > 75% °   °Sit to lying activity   °Sit to lying assist level: Minimal Assistance - Patient > 75% °   °Lying to sitting on side of bed activity   °Lying to sitting on side of bed assist level: the ability to move from lying on the back to sitting on the side of the bed with no back support.: Moderate Assistance - Patient 50 - 74% °   ° °Care Tool Transfers °Sit to stand transfer   °Sit to stand assist level: Moderate Assistance - Patient 50 - 74% °   °Chair/bed transfer   °Chair/bed transfer assist level: Maximal Assistance - Patient 25 - 49% °   ° Toilet transfer   °Assist Level: Dependent - Patient 0% (stedy) °   °Car transfer Car transfer activity did not occur: Environmental limitations °   °   °  °Care Tool Locomotion °Ambulation Ambulation activity did not occur: Safety/medical concerns °  °  °   °Walk 10 feet activity Walk 10 feet activity did not occur: Safety/medical concerns °  °   ° °Walk 50 feet with 2 turns activity Walk 50 feet with 2 turns activity did not occur: Safety/medical concerns °  °   °Walk 150 feet activity Walk 150 feet activity did not occur: Safety/medical concerns °  °   °Walk 10 feet on uneven surfaces activity Walk 10 feet on uneven surfaces activity did not occur: Safety/medical concerns °  °   °Stairs Stair activity did not occur: Safety/medical concerns °  °  °   °  Walk up/down 1 step activity Walk up/down 1 step or curb (drop down) activity did not occur: Safety/medical concerns °  °   °Walk up/down 4 steps activity Walk up/down 4 steps activity did not occur: Safety/medical concerns °  °   °Walk up/down 12 steps activity Walk up/down 12 steps activity did not occur: Safety/medical concerns °  °   °Pick up small objects from floor Pick up small object from the floor (from standing position) activity did not occur: Safety/medical concerns °  °   °Wheelchair Is the patient using a wheelchair?: Yes °Type of Wheelchair: Manual °  °Wheelchair assist level: Minimal Assistance - Patient > 75% °Max wheelchair distance: 150  °Wheel 50 feet with 2 turns activity   °Assist Level: Minimal Assistance - Patient > 75%  °Wheel 150 feet activity   °Assist Level: Minimal Assistance - Patient > 75%  ° ° °Refer to Care Plan for Long Term Goals ° °SHORT TERM GOAL WEEK 1 °PT Short Term Goal 1 (Week 1): Pt will trasnfer to and from bed with min assist consistently with SB °PT Short Term Goal 2 (Week 1): Pt will perform WC mobiltiy with supervision assist °PT Short Term Goal 3 (Week 1): Pt will ambulate 5ft with max assist and LRAD ° °Recommendations for other services: Therapeutic Recreation  Stress management ° °Skilled Therapeutic Intervention °Mobility °Bed Mobility °Bed Mobility: Supine to  Sit;Rolling Right;Rolling Left;Sit to Supine °Rolling Right: Supervision/verbal cueing °Rolling Left: Supervision/Verbal cueing °Supine to Sit: Minimal Assistance - Patient > 75% °Sit to Supine: Minimal Assistance - Patient > 75% °Transfers °Transfers: Sit to Stand;Stand to Sit °Sit to Stand: Moderate Assistance - Patient 50-74% °Stand to Sit: Minimal Assistance - Patient > 75% °Transfer (Assistive device): Rolling walker (and parallel bars) °Transfer via Lift Equipment: Stedy (min A to CGA to power up) °Locomotion  °Gait °Ambulation: No °Gait °Gait: No °Stairs / Additional Locomotion °Stairs: No °Wheelchair Mobility °Wheelchair Mobility: Yes °Wheelchair Assistance: Minimal assistance - Patient >75% °Wheelchair Propulsion: Right upper extremity;Right lower extremity °Distance: 150 ° ° °Session 1.  °Pt received sitting in WC and agreeable to PT. PT instructed patient in PT Evaluation and initiated treatment intervention; see above for results. PT educated patient in POC, rehab potential, rehab goals, and discharge recommendations along with recommendation for follow-up rehabilitation services. Pt performed sit<>stand from WC with mod assist and UER support on RW with poor postiioning of the LLE to perform urination at urinal. Mod assist for balance. SB transfers to and from WC with mod assist and max cues for anterior weight shift with poor follow through to the L side. Attempted pregait stepping with RW x 2 but unable to bear weight through LUE or LLE in stance due to baseline hemiplegia. Pt reports mod I mobility status with SPC prior to surguey, but noted to have extreme flexor tone in the LLE with movement. WC mobility as listed with hemi technique.  °Patient returned to room and left sitting in WC with call bell in reach and all needs met.   ° ° °Sesion 2.  °Pt received sitting in WC and agreeable to PT. WC mobility in hall x 100ft with CGA for improved coordination of the RUE/RLE. SB transfer to and from WC  with min-mod assist and max cues for sequencing and technique with inconsistent carryover. Sit<>stand in parallel bars x 5 with mod assist and max cues for positioning of the LLE. Noted to have clonus in the LLE as well as HS flexor   tone with attempted WB in the LLE to perform pre-gait stepping. Educated on car transfer with need for low car to perform with SB. Patient returned to room and left sitting in WC with call bell in reach and all needs met.   ° ° ° ° ° ° °Discharge Criteria: Patient will be discharged from PT if patient refuses treatment 3 consecutive times without medical reason, if treatment goals not met, if there is a change in medical status, if patient makes no progress towards goals or if patient is discharged from hospital. ° °The above assessment, treatment plan, treatment alternatives and goals were discussed and mutually agreed upon: by patient ° ° E  °02/24/2021, 3:27 PM  ° ° °

## 2021-02-24 NOTE — Progress Notes (Signed)
Inpatient Rehabilitation Admission Medication Review by a Pharmacist - Follow Up  A complete drug regimen review was completed for this patient to identify any potential clinically significant medication issues.  High Risk Drug Classes Is patient taking? Indication by Medication  Antipsychotic Yes Latuda - PTSD  Anticoagulant Yes Lovenox for DVT px  Antibiotic No   Opioid Yes Percocet pain  Antiplatelet Yes ASA/plavix CVA  Hypoglycemics/insulin Yes SSI/Semglee - DM  Vasoactive Medication Yes Amlodipine HTN  Chemotherapy No   Other Yes Atorvastatin - HLD Robaxin - muscle spasms Sertaline/trazodone - depression     Type of Medication Issue Identified Description of Issue Recommendation(s)  Drug Interaction(s) (clinically significant)     Duplicate Therapy     Allergy     No Medication Administration End Date     Incorrect Dose     Additional Drug Therapy Needed  APAP, baclofen, glimepiride, lisinopril/hctz, metformin Continue holding for now   Significant med changes from prior encounter (inform family/care partners about these prior to discharge).    Other       Clinically significant medication issues were identified that warrant physician communication and completion of prescribed/recommended actions by midnight of the next day:  Yes  Name of provider notified for urgent issues identified: Dr. Claudette Laws  Provider Method of Notification: Secure Chat  Pharmacist comments: Per MD - continue holding APAP, baclofen, glimepiride, lisinopril/hctz, metformin for now.   Time spent performing this drug regimen review (minutes):  20   [name], PharmD PGY1 Pharmacy Resident Phone [phone] 02/24/2021 1:31 PM   Please check AMION for all [facility] phone numbers After 10:00 PM, call Main Pharmacy [phone]

## 2021-02-25 LAB — GLUCOSE, CAPILLARY
Glucose-Capillary: 117 mg/dL — ABNORMAL HIGH (ref 70–99)
Glucose-Capillary: 140 mg/dL — ABNORMAL HIGH (ref 70–99)
Glucose-Capillary: 133 mg/dL — ABNORMAL HIGH (ref 70–99)
Glucose-Capillary: 122 mg/dL — ABNORMAL HIGH (ref 70–99)

## 2021-02-25 NOTE — IPOC Note (Signed)
Overall Plan of Care [facility]) Patient Details Name: Adam Perez MRN: [id number] DOB: 08/11/1955  Admitting Diagnosis: History of transmetatarsal amputation of left foot [facility])  Hospital Problems: Principal Problem:   History of transmetatarsal amputation of left foot (HCC)     Functional Problem List: Nursing Bowel, Edema, Endurance, Medication Management, Pain, Safety, Skin Integrity  PT Balance, Behavior, Edema, Endurance, Motor, Nutrition, Pain, Perception, Safety, Skin Integrity, Sensory  OT Balance, Sensory, Skin Integrity, Cognition, Endurance, Motor, Pain, Safety  SLP    TR         Basic ADLs: OT Grooming, Bathing, Dressing, Toileting     Advanced  ADLs: OT       Transfers: PT Bed Mobility, Bed to Chair, Car, [employer], Civil Service fast streamer, Research scientist (life sciences): PT Ambulation, Psychologist, prison and probation services, Stairs     Additional Impairments: OT None  SLP        TR      Anticipated Outcomes Item Anticipated Outcome  Self Feeding set-up A  Swallowing      Basic self-care  mod I  Toileting  mod I   Bathroom Transfers S  Bowel/Bladder  min assist  Transfers  CGA with LRAD  Locomotion  WC mobility Mod I  Communication     Cognition     Pain  < 3  Safety/Judgment  min assist and no falls   Therapy Plan: PT Intensity: Minimum of 1-2 x/day ,45 to 90 minutes PT Frequency: 5 out of 7 days PT Duration Estimated Length of Stay: 12-16 days OT Intensity: Minimum of 1-2 x/day, 45 to 90 minutes OT Frequency: 5 out of 7 days OT Duration/Estimated Length of Stay: 13 - 15 days     Due to the current state of emergency, patients may not be receiving their 3-hours of Medicare-mandated therapy.   Team Interventions: Nursing Interventions Patient/Family Education, Bowel Management, Disease Management/Prevention, Pain Management, Medication Management, Skin Care/Wound Management, Discharge Planning  PT interventions Ambulation/gait training, Balance/vestibular  training, Cognitive remediation/compensation, Disease management/prevention, Discharge planning, Community reintegration, DME/adaptive equipment instruction, Functional electrical stimulation, Functional mobility training, Patient/family education, Pain management, Neuromuscular re-education, Psychosocial support, Skin care/wound management, Splinting/orthotics, Therapeutic Exercise, Therapeutic Activities, Stair training, UE/LE Strength taining/ROM, UE/LE Coordination activities, Visual/perceptual remediation/compensation, Wheelchair propulsion/positioning  OT Interventions Balance/vestibular training, Disease mangement/prevention, Neuromuscular re-education, Self Care/advanced ADL retraining, Therapeutic Exercise, Wheelchair propulsion/positioning, UE/LE Strength taining/ROM, Skin care/wound managment, Pain management, DME/adaptive equipment instruction, Cognitive remediation/compensation, Community reintegration, Functional electrical stimulation, Patient/family education, Splinting/orthotics, UE/LE Coordination activities, Visual/perceptual remediation/compensation, Therapeutic Activities, Psychosocial support, Functional mobility training, Discharge planning  SLP Interventions    TR Interventions    SW/CM Interventions     Barriers to Discharge MD  Medical stability  Nursing Decreased caregiver support, Wound Care, Lack of/limited family support, Weight, Weight bearing restrictions, Medication compliance Lives alone, separated from spouse. Will stay with spouse at discharge. 1 level home with level entry. Spouse and 2 daughters can provide intermittent assist at discharge.  PT Decreased caregiver support, Inaccessible home environment, Home environment access/layout, Weight bearing restrictions, Lack of/limited family support, Behavior    OT Decreased caregiver support, Wound Care    SLP      SW       Team Discharge Planning: Destination: PT-Home ,OT- Home , SLP-  Projected Follow-up:  PT-Skilled nursing facility, Home health PT, OT-  Home health OT, Outpatient OT, SLP-  Projected Equipment Needs: PT-Rolling walker with 5" wheels, Wheelchair (measurements), Wheelchair cushion (measurements), OT- To be determined, SLP-  Equipment  Details: PT- , OT-  Patient/family involved in discharge planning: PT- Patient,  OT-Patient, SLP-   MD ELOS: 12-15 days Medical Rehab Prognosis:  Excellent Assessment: The patient has been admitted for CIR therapies with the diagnosis of right TMA d/t PAD. The team will be addressing functional mobility, strength, stamina, balance, safety, adaptive techniques and equipment, self-care, bowel and bladder mgt, patient and caregiver education, WB precautions pain mgt, wound care, community reentry. Goals have been set at mod I to supervision with self-care and mobility at w/c level.   Due to the current state of emergency, patients may not be receiving their 3 hours per day of Medicare-mandated therapy.    Ranelle Oyster, MD, FAAPMR     See Team Conference Notes for weekly updates to the plan of care

## 2021-02-25 NOTE — Progress Notes (Signed)
PROGRESS NOTE   Subjective/Complaints:  No issues overnite , some Left foot pain but otherwise no c/os  ROS- neg CP, SOB, N/V/D  Objective:   No results found. Recent Labs    02/22/21 0820  WBC 9.6  HGB 8.5*  HCT 24.6*  PLT 329    Recent Labs    02/22/21 0820 02/23/21 0451  NA 140  --   K 3.8  --   CL 110  --   CO2 24  --   GLUCOSE 119*  --   BUN 13  --   CREATININE 0.87 0.96  CALCIUM 8.6*  --      Intake/Output Summary (Last 24 hours) at 02/25/2021 0646 Last data filed at 02/25/2021 0500 Gross per 24 hour  Intake 708 ml  Output 1601 ml  Net -893 ml         Physical Exam: Vital Signs Blood pressure 97/67, pulse 85, temperature 98.4 F (36.9 C), temperature source Oral, resp. rate 16, height 5\' 7"  (1.702 m), weight 104.3 kg, SpO2 95 %.  General: No acute distress Mood and affect are appropriate Heart: Regular rate and rhythm no rubs murmurs or extra sounds Lungs: Clear to auscultation, breathing unlabored, no rales or wheezes Abdomen: Positive bowel sounds, soft nontender to palpation, nondistended Extremities: No clubbing, cyanosis, or edema  Musculoskeletal: Full range of motion in all 4 extremities. No joint swelling  Extremities:LLE 1+ edema Skin: No evidence of breakdown, no evidence of rash Neurologic: Cranial nerves II through XII intact, motor strength is 5/5 in Right , 3- Left deltoid, bicep, tricep, grip, hip flexor, knee extensors, L ankle dorsiflexor and plantar flexor not tested due to dressing wound vac    Musculoskeletal: Left TMA with wound vac    Assessment/Plan: 1. Functional deficits which require 3+ hours per day of interdisciplinary therapy in a comprehensive inpatient rehab setting. Physiatrist is providing close team supervision and 24 hour management of active medical problems listed below. Physiatrist and rehab team continue to assess barriers to discharge/monitor  patient progress toward functional and medical goals  Care Tool:  Bathing    Body parts bathed by patient: Right arm, Left arm, Chest, Abdomen, Front perineal area, Right upper leg, Left upper leg   Body parts bathed by helper: Buttocks, Right lower leg Body parts n/a: Left lower leg   Bathing assist Assist Level: Moderate Assistance - Patient 50 - 74%     Upper Body Dressing/Undressing Upper body dressing   What is the patient wearing?: Hospital gown only, Pull over shirt    Upper body assist Assist Level: Supervision/Verbal cueing (EOB)    Lower Body Dressing/Undressing Lower body dressing      What is the patient wearing?: Pants     Lower body assist Assist for lower body dressing: Total Assistance - Patient < 25%     Toileting Toileting    Toileting assist Assist for toileting: Dependent - Patient 0% (stedy) Assistive Device Comment: urinal   Transfers Chair/bed transfer  Transfers assist     Chair/bed transfer assist level: Dependent - mechanical lift (stedy)     Locomotion Ambulation   Ambulation assist   Ambulation activity did not  occur: Safety/medical concerns          Walk 10 feet activity   Assist  Walk 10 feet activity did not occur: Safety/medical concerns        Walk 50 feet activity   Assist Walk 50 feet with 2 turns activity did not occur: Safety/medical concerns         Walk 150 feet activity   Assist Walk 150 feet activity did not occur: Safety/medical concerns         Walk 10 feet on uneven surface  activity   Assist Walk 10 feet on uneven surfaces activity did not occur: Safety/medical concerns         Wheelchair     Assist Is the patient using a wheelchair?: Yes Type of Wheelchair: Manual    Wheelchair assist level: Minimal Assistance - Patient > 75% Max wheelchair distance: 150    Wheelchair 50 feet with 2 turns activity    Assist        Assist Level: Minimal Assistance - Patient  > 75%   Wheelchair 150 feet activity     Assist      Assist Level: Minimal Assistance - Patient > 75%   Blood pressure 97/67, pulse 85, temperature 98.4 F (36.9 C), temperature source Oral, resp. rate 16, height 5\' 7"  (1.702 m), weight 104.3 kg, SpO2 95 %.  Medical Problem List and Plan: 1. 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                -patient may shower if LLE is covered             -ELOS/Goals: 10-12 days, supervision to min assist goals with PT, OT 2.  Antithrombotics: -DVT/anticoagulation:  Pharmaceutical: Lovenox             -antiplatelet therapy: Aspirin 81 mg daily and Plavix 25 mg daily 3. Pain Management: Oxycodone/Robaxin as needed 4. Mood: Zoloft 150 mg daily, trazodone 100 mg nightly             -antipsychotic agents: Latuda 20 mg daily 5. Neuropsych: This patient is capable of making decisions on his own behalf. 6. Skin/Wound Care:  continue vac per surgery             -add eucerin cream for RLE 7. Fluids/Electrolytes/Nutrition: Routine in and outs with follow-up chemistries 8.  Acute blood loss anemia.  Follow-up CBC 9.  Diabetes mellitus.  Hemoglobin A1c 6.1.  Semglee 10 units nightly.  Check blood sugars before meals and at bedtime.  Diabetic teaching CBG (last 3)  Recent Labs    02/24/21 1624 02/24/21 2103 02/25/21 0605  GLUCAP 106* 148* 117*    Controlled 12/18 10.  Hypertension.  Norvasc 10 mg daily.  Monitor with increased mobility Vitals:   02/24/21 1958 02/25/21 0514  BP: 110/79 97/67  Pulse: 94 85  Resp: 17 16  Temp: 98.3 F (36.8 C) 98.4 F (36.9 C)  SpO2: 100% 95%    11.  Hyperlipidemia.  Lipitor 12.  History of CVA with residual left-sided weakness.  Continue aspirin and Plavix 13.  Morbid obesity.  BMI 37.74.  Dietary follow-up 14.  Constipation.  MiraLAX twice daily as needed,  Senokot S1 tab twice daily as needed             -no BM since 12/11             -  ordered sorbitol for tonight    LOS: 2 days A FACE TO FACE EVALUATION WAS PERFORMED  Erick Colace 02/25/2021, 6:46 AM

## 2021-02-25 NOTE — Discharge Instructions (Addendum)
Inpatient Rehab Discharge Instructions  Adam Perez Discharge date and time: No discharge date for patient encounter.   Activities/Precautions/ Functional Status: Activity: As tolerated Diet: Diabetic diet Wound Care: Santyl applied to left lower extremity and a nickel thick layer, cover with moistened saline gauze, dry gauze ABD pad and wrap with Kerlix and Coban change every other day Functional status:  ___ No restrictions     ___ Walk up steps independently ___ 24/7 supervision/assistance   ___ Walk up steps with assistance ___ Intermittent supervision/assistance  ___ Bathe/dress independently ___ Walk with walker     _x__ Bathe/dress with assistance ___ Walk Independently    ___ Shower independently ___ Walk with assistance    ___ Shower with assistance ___ No alcohol     ___ Return to work/school ________  Special Instructions: No driving smoking or alcohol  Continue to hold Amaryl and Glucophage for now and follow-up with PCP.  Continue to hold Zestoretic blood pressure medication for now and follow-up with PCP.   COMMUNITY REFERRALS UPON DISCHARGE:    Home Health:   PT   OT    RN                  Agency:[facility]   PHONE: [phone]               Medical Equipment/Items Ordered: WIDE DROP-ARM BEDSIDE COMMODE, TRANSFER BOARD, TUB BENCH AND WHEELCHAIR                                                 Agency/Supplier: [facility]  [phone]    My questions have been answered and I understand these instructions. I will adhere to these goals and the provided educational materials after my discharge from the hospital.  Patient/Caregiver Signature _______________________________ Date __________  Clinician Signature _______________________________________ Date __________  Please bring this form and your medication list with you to all your follow-up doctor's appointments.

## 2021-02-25 NOTE — Progress Notes (Signed)
Occupational Therapy Session Note  Patient Details  Name: Adam Perez MRN: [id number] Date of Birth: Apr 08, 1955  Today's Date: 02/26/2021 OT Individual Time: 1300-1400 OT Individual Time Calculation (min): 60 min   Short Term Goals: Week 1:  OT Short Term Goal 1 (Week 1): Pt will independtly recall LLE PWB restrictions. OT Short Term Goal 2 (Week 1): Pt will don pants with max A + AE PRN. OT Short Term Goal 3 (Week 1): Pt will complete toilet transfer with max A and LRAD. OT Short Term Goal 4 (Week 1): Pt will complete standing grooming task with no more than min cues to adhere to LLE PWB precautions.  Skilled Therapeutic Interventions/Progress Updates:    Pt greeted in the w/c and agreeable to session. Started by assisting pt don his Lt Darco shoe and then pt was able to don his Rt sneaker with setup assistance. Started by using the Stedy to work on postural skills and LE strength needed to progress him to using the RW for functional transfers. Sit<stands in [place] completed with CGA. Multimodal cues provided to improve hip extension in upright, also needed manual assist and cues to improve Lt knee flexion/reliance on the Stedy knee block. OT had to adjust his Darco shoe frequently on the [place] platform to make sure his forefoot was in an offloaded position. Transitioned to functional transfer training using the slideboard, pt completed transfers to/from bed with overall Mod A going towards Lt, Min A going towards Rt. Worked on improving anterior weight shift and attention to Lt foot position. Discussed improving overall technique for scooting and how to set up DME for transfer. Pt appreciative of education and appeared receptive, exhibited good carryover of instruction. He remained sitting up at close of session, all needs within reach and chair alarm set.   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  CVA Required Braces or Orthoses: Other Brace Other Brace: L darco Restrictions Weight Bearing Restrictions: Yes LLE Weight Bearing: Partial weight bearing LLE Partial Weight Bearing Percentage or Pounds: wb on L heel  Pain: pt reported pain to be manageable with repositioning/rest breaks throughout Pain Assessment Pain Scale: 0-10 Pain Score: 3  Pain Type: Surgical pain Pain Location: Leg Pain Orientation: Left Pain Intervention(s): Medication (See eMAR);Repositioned;Elevated extremity;Emotional support;Relaxation;Rest ADL: ADL Eating: Set up Where Assessed-Eating: Bed level, Wheelchair Grooming: Supervision/safety Where Assessed-Grooming: Edge of bed Upper Body Bathing: Supervision/safety Where Assessed-Upper Body Bathing: Edge of bed Lower Body Bathing: Moderate assistance Where Assessed-Lower Body Bathing: Edge of bed Upper Body Dressing: Supervision/safety Where Assessed-Upper Body Dressing: Edge of bed Lower Body Dressing: Maximal assistance Where Assessed-Lower Body Dressing: Edge of bed Toileting: Dependent (dependent in stedy) Where Assessed-Toileting: Toilet, Bedside Commode Toilet Transfer: Dependent (stedy) Toilet Transfer Method: Other (comment) (stedy) Toilet Transfer Equipment: Bedside commode, Grab bars Tub/Shower Transfer: Not assessed Film/video editor: Not assessed   Therapy/Group: Individual Therapy  Lyris Hitchman A Tra Wilemon 02/26/2021, 3:46 PM

## 2021-02-26 ENCOUNTER — Other Ambulatory Visit: Payer: Self-pay

## 2021-02-26 DIAGNOSIS — I739 Peripheral vascular disease, unspecified: Secondary | ICD-10-CM

## 2021-02-26 DIAGNOSIS — R7989 Other specified abnormal findings of blood chemistry: Secondary | ICD-10-CM

## 2021-02-26 LAB — CBC WITH DIFFERENTIAL/PLATELET
Abs Immature Granulocytes: 0.05 10*3/uL (ref 0.00–0.07)
Basophils Absolute: 0.1 10*3/uL (ref 0.0–0.1)
Basophils Relative: 1 %
Eosinophils Absolute: 0.1 10*3/uL (ref 0.0–0.5)
Eosinophils Relative: 2 %
HCT: 27.9 % — ABNORMAL LOW (ref 39.0–52.0)
Hemoglobin: 9.3 g/dL — ABNORMAL LOW (ref 13.0–17.0)
Immature Granulocytes: 1 %
Lymphocytes Relative: 18 %
Lymphs Abs: 1.5 10*3/uL (ref 0.7–4.0)
MCH: 28.8 pg (ref 26.0–34.0)
MCHC: 33.3 g/dL (ref 30.0–36.0)
MCV: 86.4 fL (ref 80.0–100.0)
Monocytes Absolute: 0.7 10*3/uL (ref 0.1–1.0)
Monocytes Relative: 9 %
Neutro Abs: 5.9 10*3/uL (ref 1.7–7.7)
Neutrophils Relative %: 69 %
Platelets: 433 10*3/uL — ABNORMAL HIGH (ref 150–400)
RBC: 3.23 MIL/uL — ABNORMAL LOW (ref 4.22–5.81)
RDW: 15.6 % — ABNORMAL HIGH (ref 11.5–15.5)
WBC: 8.3 10*3/uL (ref 4.0–10.5)
nRBC: 0 % (ref 0.0–0.2)

## 2021-02-26 LAB — GLUCOSE, CAPILLARY
Glucose-Capillary: 129 mg/dL — ABNORMAL HIGH (ref 70–99)
Glucose-Capillary: 105 mg/dL — ABNORMAL HIGH (ref 70–99)
Glucose-Capillary: 119 mg/dL — ABNORMAL HIGH (ref 70–99)
Glucose-Capillary: 116 mg/dL — ABNORMAL HIGH (ref 70–99)

## 2021-02-26 LAB — COMPREHENSIVE METABOLIC PANEL
ALT: 52 U/L — ABNORMAL HIGH (ref 0–44)
AST: 30 U/L (ref 15–41)
Albumin: 2.5 g/dL — ABNORMAL LOW (ref 3.5–5.0)
Alkaline Phosphatase: 63 U/L (ref 38–126)
Anion gap: 6 (ref 5–15)
BUN: 25 mg/dL — ABNORMAL HIGH (ref 8–23)
CO2: 25 mmol/L (ref 22–32)
Calcium: 8.6 mg/dL — ABNORMAL LOW (ref 8.9–10.3)
Chloride: 104 mmol/L (ref 98–111)
Creatinine, Ser: 1.1 mg/dL (ref 0.61–1.24)
GFR, Estimated: >60 mL/min (ref >60)
Glucose, Bld: 107 mg/dL — ABNORMAL HIGH (ref 70–99)
Potassium: 4.2 mmol/L (ref 3.5–5.1)
Sodium: 135 mmol/L (ref 135–145)
Total Bilirubin: 0.7 mg/dL (ref 0.3–1.2)
Total Protein: 6.7 g/dL (ref 6.5–8.1)

## 2021-02-26 MED ORDER — LIVING WELL WITH DIABETES BOOK
Freq: Once | Status: AC
Start: 2021-02-26 — End: 2021-02-26
  Filled 2021-02-26: qty 1

## 2021-02-26 MED ORDER — SENNOSIDES-DOCUSATE SODIUM 8.6-50 MG PO TABS
1.0000 | ORAL_TABLET | Freq: Every day | ORAL | Status: DC
  Administered 2021-02-26 – 2021-02-28 (×3): 1 via ORAL
  Filled 2021-02-26 (×3): qty 1

## 2021-02-26 MED ORDER — EXERCISE FOR HEART AND HEALTH BOOK
Freq: Once | Status: AC
Start: 2021-02-26 — End: 2021-02-26
  Filled 2021-02-26: qty 1

## 2021-02-26 NOTE — Consult Note (Signed)
Funk Nurse wound follow up Patient receiving care in [facility] 4M05 Wound type: Surgical Measurement: 9.8 cm x 11 cm x 0.1 cm Wound bed: Beefy red with myriad skin substitute present Drainage (amount, consistency, odor) Pink/red purulent Periwound: intact Dressing procedure/placement/frequency: Ace wrap and kerlix removed with one piece of black foam dressing. 2 pieces of adaptic placed over the wound, followed by hydrogel and one piece of black foam. 1 piece of adaptic placed over the staples at the amputation site. Wrapped the LE from the tip to the knee with Kerlix and Ace wrap. Supplies ordered to be placed in top drawer at the window.  WOC will follow MWF  Jocelyn Lamer L. Tamala Julian, MSN, RN, [place], Lysle Pearl, [facility] Wound Treatment Associate Pager [phone]

## 2021-02-26 NOTE — Progress Notes (Signed)
Inpatient Rehabilitation Center Individual Statement of Services  Patient Name:  Adam Perez  Date:  02/26/2021  Welcome to the Inpatient Rehabilitation Center.  Our goal is to provide you with an individualized program based on your diagnosis and situation, designed to meet your specific needs.  With this comprehensive rehabilitation program, you will be expected to participate in at least 3 hours of rehabilitation therapies Monday-Friday, with modified therapy programming on the weekends.  Your rehabilitation program will include the following services:  Physical Therapy (PT), Occupational Therapy (OT), 24 hour per day rehabilitation nursing, Therapeutic Recreaction (TR), Neuropsychology, Care Coordinator, Rehabilitation Medicine, Nutrition Services, and Pharmacy Services  Weekly team conferences will be held on Tuesday to discuss your progress.  Your Inpatient Rehabilitation Care Coordinator will talk with you frequently to get your input and to update you on team discussions.  Team conferences with you and your family in attendance may also be held.  Expected length of stay: 12-16 days  Overall anticipated outcome: CGA-min assist level  Depending on your progress and recovery, your program may change. Your Inpatient Rehabilitation Care Coordinator will coordinate services and will keep you informed of any changes. Your Inpatient Rehabilitation Care Coordinator's name and contact numbers are listed  below.  The following services may also be recommended but are not provided by the Inpatient Rehabilitation Center:  Driving Evaluations Home Health Rehabiltiation Services Outpatient Rehabilitation Services    Arrangements will be made to provide these services after discharge if needed.  Arrangements include referral to agencies that provide these services.  Your insurance has been verified to be:  Medicare & Tricare Your primary doctor is:  [facility]  Pertinent information will  be shared with your doctor and your insurance company.  Inpatient Rehabilitation Care Coordinator:  Dossie Der, [place] [phone] or Luna [place]  Information discussed with and copy given to patient by: Lucy Chris, 02/26/2021, 10:28 AM

## 2021-02-26 NOTE — Progress Notes (Addendum)
PROGRESS NOTE   Subjective/Complaints:  Left foot still tender but trying to work thru. Had bm this weekend 12/17.   ROS: Patient denies fever, rash, sore throat, blurred vision, nausea, vomiting, diarrhea, cough, shortness of breath or chest pain,   headache, or mood change.    Objective:   No results found. Recent Labs    02/26/21 0625  WBC 8.3  HGB 9.3*  HCT 27.9*  PLT 433*   Recent Labs    02/26/21 0625  NA 135  K 4.2  CL 104  CO2 25  GLUCOSE 107*  BUN 25*  CREATININE 1.10  CALCIUM 8.6*    Intake/Output Summary (Last 24 hours) at 02/26/2021 2707 Last data filed at 02/26/2021 0740 Gross per 24 hour  Intake 240 ml  Output 1900 ml  Net -1660 ml        Physical Exam: Vital Signs Blood pressure 120/80, pulse 84, temperature 98.4 F (36.9 C), temperature source Oral, resp. rate 16, height 5\' 7"  (1.702 m), weight 104.3 kg, SpO2 93 %.  Constitutional: No distress . Vital signs reviewed. HEENT: NCAT, EOMI, oral membranes moist Neck: supple Cardiovascular: RRR without murmur. No JVD    Respiratory/Chest: CTA Bilaterally without wheezes or rales. Normal effort    GI/Abdomen: BS +, non-tender, non-distended Ext: no clubbing, cyanosis  Psych: pleasant and cooperative  Musculoskeletal: Full range of motion in all 4 extremities. No joint swelling  Extremities:LLE 1+ edema/splint Skin: No evidence of breakdown, no evidence of rash, right foot dry, left leg dressed/vac Neurologic: Cranial nerves II through XII intact, motor strength is 5/5 in Right , 3- Left deltoid, bicep, tricep, grip, hip flexor, knee extensors, L ankle dorsiflexor and plantar flexor not tested due to dressing wound vac  Musculoskeletal: Left TMA with wound vac, leg quite tender to manipulation still   Assessment/Plan: 1. Functional deficits which require 3+ hours per day of interdisciplinary therapy in a comprehensive inpatient rehab  setting. Physiatrist is providing close team supervision and 24 hour management of active medical problems listed below. Physiatrist and rehab team continue to assess barriers to discharge/monitor patient progress toward functional and medical goals  Care Tool:  Bathing    Body parts bathed by patient: Right arm, Left arm, Chest, Abdomen, Front perineal area, Right upper leg, Left upper leg   Body parts bathed by helper: Buttocks, Right lower leg Body parts n/a: Left lower leg   Bathing assist Assist Level: Moderate Assistance - Patient 50 - 74%     Upper Body Dressing/Undressing Upper body dressing   What is the patient wearing?: Hospital gown only, Pull over shirt    Upper body assist Assist Level: Supervision/Verbal cueing (EOB)    Lower Body Dressing/Undressing Lower body dressing      What is the patient wearing?: Pants     Lower body assist Assist for lower body dressing: Total Assistance - Patient < 25%     Toileting Toileting    Toileting assist Assist for toileting: Independent with assistive device Assistive Device Comment: urinal   Transfers Chair/bed transfer  Transfers assist     Chair/bed transfer assist level: Moderate Assistance - Patient 50 - 74%  Locomotion Ambulation   Ambulation assist   Ambulation activity did not occur: Safety/medical concerns          Walk 10 feet activity   Assist  Walk 10 feet activity did not occur: Safety/medical concerns        Walk 50 feet activity   Assist Walk 50 feet with 2 turns activity did not occur: Safety/medical concerns         Walk 150 feet activity   Assist Walk 150 feet activity did not occur: Safety/medical concerns         Walk 10 feet on uneven surface  activity   Assist Walk 10 feet on uneven surfaces activity did not occur: Safety/medical concerns         Wheelchair     Assist Is the patient using a wheelchair?: Yes Type of Wheelchair: Manual     Wheelchair assist level: Minimal Assistance - Patient > 75% Max wheelchair distance: 150    Wheelchair 50 feet with 2 turns activity    Assist        Assist Level: Minimal Assistance - Patient > 75%   Wheelchair 150 feet activity     Assist      Assist Level: Minimal Assistance - Patient > 75%   Blood pressure 120/80, pulse 84, temperature 98.4 F (36.9 C), temperature source Oral, resp. rate 16, height 5\' 7"  (1.702 m), weight 104.3 kg, SpO2 93 %.  Medical Problem List and Plan: 1. 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                -patient may shower if LLE is covered             -ELOS/Goals: 10-12 days, supervision to min assist goals with PT, OT  -Continue CIR therapies including PT, OT  2.  Antithrombotics: -DVT/anticoagulation:  Pharmaceutical: Lovenox             -antiplatelet therapy: Aspirin 81 mg daily and Plavix 25 mg daily 3. Pain Management: Oxycodone/Robaxin as needed  12/19 pt reports that meds are keeping pain within reasonable range 4. Mood: Zoloft 150 mg daily, trazodone 100 mg nightly             -antipsychotic agents: Latuda 20 mg daily 5. Neuropsych: This patient is capable of making decisions on his own behalf. 6. Skin/Wound Care:  continue vac per surgery             -added eucerin cream for RLE 7. Fluids/Electrolytes/Nutrition: eating fairly well  12/19 -pre-renal azotemia--by labs   -push fluids and recheck later this week 8.  Acute blood loss anemia.  Follow-up CBC 9.  Diabetes mellitus.  Hemoglobin A1c 6.1.  Semglee 10 units nightly.  Check blood sugars before meals and at bedtime.  Diabetic teaching CBG (last 3)  Recent Labs    02/25/21 1636 02/25/21 2111 02/26/21 0743  GLUCAP 133* 122* 129*   Controlled 12/19 10.  Hypertension.  Norvasc 10 mg daily.  Monitor with increased  mobility Vitals:   02/25/21 1929 02/26/21 0543  BP: 120/77 120/80  Pulse: 83 84  Resp: 20 16  Temp: 98 F (36.7 C) 98.4 F (36.9 C)  SpO2: 100% 93%    11.  Hyperlipidemia.  Lipitor 12.  History of CVA with residual left-sided weakness.  Continue aspirin and Plavix 13.  Morbid obesity.  BMI 37.74.  Dietary  follow-up 14.  Constipation.  MiraLAX twice daily as needed.              -had bm 12/17--change senna-s to 2 tabs scheduled at HS    LOS: 3 days A FACE TO FACE EVALUATION WAS PERFORMED  Ranelle Oyster 02/26/2021, 9:22 AM

## 2021-02-26 NOTE — Progress Notes (Signed)
Occupational Therapy Session Note  Patient Details  Name: Adam Perez MRN: [id number] Date of Birth: [birth year]  Today's Date: 02/26/2021 OT Individual Time: 1000-1100 OT Individual Time Calculation (min): 60 min    Short Term Goals:  Week 1:  OT Short Term Goal 1 (Week 1): Pt will independtly recall LLE PWB restrictions. OT Short Term Goal 2 (Week 1): Pt will don pants with max A + AE PRN. OT Short Term Goal 3 (Week 1): Pt will complete toilet transfer with max A and LRAD. OT Short Term Goal 4 (Week 1): Pt will complete standing grooming task with no more than min cues to adhere to LLE PWB precautions.   Skilled Therapeutic Interventions/Progress Updates:    Pt received in wc ready for therapy.  He spent time discussing how he overcame in CVA in 2017, and despite his ongoing L side weakness/spasticity he was able to live alone, cook, drive, walk around [employer].  Discussed how his determination in the past is a huge asset to his recovery now.  Reviewed his challenge with transfers since admission.  This session focused on transfers with use of slide board. Demonstrated to pt head/hip ratio and forward lean required for a smooth slide board that can progress to scoot transfers.  Explained and demonstrated why shoulders need to be forward. Pt listened carefully and then practiced using board from wc to bed to his R and bed to wc to his L. First transfer min -mod A to really get pt's trunk flexed at hips for shoulders to move forward. Halfway through transfer pt understood much better and then was able to complete with CGA.  Cues to keep hand flat on board and push board down.  On return transfer to wc, pt able to implement strategies and transferred with CGA!  On last few inches, had pt scoot vs slide on board.  Pt stated that understanding why he needed to lean so far forward was very helpful, he then practiced that movement pattern in wc to wt shift forward and pushed up with R hand to fully  lift hips out of seat. Practiced 5x 2.   He has good movement of LUE and able to extend arm against hypertone to -30 elbow ext. Has limited wrist ext. Discussed using a resting hand splint, will discuss more tomorrow with him.  Had pt work on AROM of elbow by sliding towel up and down slide board resting on lap for shoulder activation and active stretch of his elbow. Pt participated well. Resting in wc with alarm on and all needs met.  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 Required Braces or Orthoses: Other Brace Other Brace: L darco Restrictions Weight Bearing Restrictions: Yes LLE Weight Bearing: Partial weight bearing LLE Partial Weight Bearing Percentage or Pounds: wb on L heel      Pain: Pain Assessment Pain Scale: 0-10 Pain Score: 3  Pain Type: Surgical pain Pain Location: Leg Pain Orientation: Left Pain Intervention(s): Medication (See eMAR);Repositioned;Emotional support;Relaxation;Rest    Therapy/Group: Individual Therapy  Bruce 02/26/2021, 12:22 PM

## 2021-02-26 NOTE — Progress Notes (Signed)
Inpatient Rehabilitation Care Coordinator Assessment and Plan Patient Details  Name: Adam Perez MRN: [id number] Date of Birth: [birth year]  Today's Date: 02/26/2021  Hospital Problems: Principal Problem:   History of transmetatarsal amputation of left foot [facility])  Past Medical History:  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   Past Surgical History:  Past Surgical History:  Procedure Laterality Date   ANGIOPLASTY Left 02/19/2021   Procedure: POPLITEAL ANGIOPLASTY;  Surgeon: Leonie Douglas, MD;  Location[facility] OR;  Service: Vascular;  Laterality: Left;   LOWER EXTREMITY ANGIOGRAM Left 02/19/2021   Procedure: LEFT LOWER EXTREMITY ANGIOGRAM;  Surgeon: Leonie Douglas, MD;  Location: [facility] OR;  Service: Vascular;  Laterality: Left;   LOWER EXTREMITY ANGIOGRAPHY N/A 02/15/2021   Procedure: LOWER EXTREMITY ANGIOGRAPHY;  Surgeon: Leonie Douglas, MD;  Location: [facility] INVASIVE CV LAB;  Service: Cardiovascular;  Laterality: N/A;   TRANSMETATARSAL AMPUTATION Left 02/19/2021   Procedure: LEFT TRANSMETATARSAL AMPUTATION;  Surgeon: Leonie Douglas, MD;  Location[facility] OR;  Service: Vascular;  Laterality: Left;   ULTRASOUND GUIDANCE FOR VASCULAR ACCESS Right 02/19/2021   Procedure: ULTRASOUND GUIDANCE FOR RIGHT COMMON FEMORAL ARTERY ACCESS;  Surgeon: Leonie Douglas, MD;  Location: [facility] OR;  Service: Vascular;  Laterality: Right;   WOUND DEBRIDEMENT Left 02/19/2021   Procedure: LEFT CALF VENOUS ULCER SHARP EXCISIONAL DEBRIDEMENT;  Surgeon: Leonie Douglas, MD;  Location: [facility] OR;  Service: Vascular;  Laterality: Left;   Social History:  reports that he has never smoked. He has never used smokeless tobacco. He reports that he does not drink alcohol and does not use  drugs.  Family / Support Systems Marital Status: Separated How Long?: 20 years Patient Roles: Spouse, Parent, Other (Comment) (sibling) Spouse/Significant Other: Pattricia Boss [phone] Children: Two daughter's who are both local Other Supports: brother acroos the street from wife Anticipated Caregiver: [name] Ability/Limitations of Caregiver: Works full time planning on taking FMLA and retiring at first of the year Caregiver Availability: Other (Comment) (wife working on [employer]) Family Dynamics: Close with family and sibling. He feels between all of them they can provide the care he requires. He is realizing how much that is right now. He thought he was doing better than he is  Social History Preferred language: English Religion:  Cultural Background: No issues Education: HS Health Literacy - How often do you need to have someone help you when you read instructions, pamphlets, or other written material from your doctor or pharmacy?: Never Writes: Yes Employment Status: Disabled Marine scientist Issues: NO issues Guardian/Conservator: None-according to MD pt is capable of making hiw own decisions while here   Abuse/Neglect Abuse/Neglect Assessment Can Be Completed: Yes Physical Abuse: Denies Verbal Abuse: Denies Sexual Abuse: Denies Exploitation of patient/patient's resources: Denies Self-Neglect: Denies  Patient response to: Social Isolation - How often do you feel lonely or isolated from those around you?: Never  Emotional Status Pt's affect, behavior and adjustment status: Pt is motivated to do for himself and is now realizing how mcuh work he has to do. 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 He may benefit from seeing  neuro-psych while here Substance Abuse History: No issues  Patient / Family Perceptions, Expectations & Goals Pt/Family understanding of illness & functional limitations: Pt can explain his foot and feels it hinders him in his mobility and balance. He talks with the MD daily and feels he has a good understanding of his treatment plan moving forward. Premorbid pt/family roles/activities: Husband, father, brother, [employer], friend, etc Anticipated changes in roles/activities/participation: resume Pt/family expectations/goals: Pt states: " I have some work to do that therapist showed me how much care I need that was a Child psychotherapist."  [employer]: Other (Comment) ([facility] in [place]) Premorbid Home Care/DME Agencies: Other (Comment) (rw) Transportation available at discharge: Pt drove, now will realy upon his family Is the patient able to respond to transportation needs?: Yes In the past 12 months, has lack of transportation kept you from medical appointments or from getting medications?: No In the past 12 months, has lack of transportation kept you from meetings, work, or from getting things needed for daily living?: No Resource referrals recommended: Neuropsychology  Discharge Planning Living Arrangements: Alone Support Systems: Spouse/significant other, Children, Other relatives, Friends/neighbors Type of Residence: Private residence Insurance Resources: Doctor, hospital) Financial Resources: Restaurant manager, fast food Screen Referred: Yes Living Expenses: Rent Money Management: Patient Does the patient have any problems obtaining your medications?: No Home Management: Patient Patient/Family Preliminary Plans: Plan now to go to wife's home where she will be taking FMLA and assisting with his care. Both still get along but have been separated for 20 years. They do have children together and get along better now than when married. Aware team setting goals and  ELOS. Care Coordinator Anticipated Follow Up Needs: HH/OP  Clinical Impression Pleasant gentleman who is motivated to recover and become independent again. He is not used to relying upon others. Between his wife and brother he feels he will have care at discharge. Wife to bring in FMLA papers to be completed. Will work on safe discharge plan for pt.  Lucy Chris 02/26/2021, 10:25 AM

## 2021-02-26 NOTE — Plan of Care (Signed)
  Problem: RH PAIN MANAGEMENT Goal: RH STG PAIN MANAGED AT OR BELOW PT'S PAIN GOAL Description <3 on a 0-10 pain scale  Outcome: Progressing   

## 2021-02-26 NOTE — Progress Notes (Signed)
Physical Therapy Session Note  Patient Details  Name: Adam Perez MRN: [id number] Date of Birth: Oct 01, 1955  Today's Date: 02/26/2021 PT Individual Time: 0802-0917 PT Individual Time Calculation (min): 75 min   Short Term Goals: Week 1:  PT Short Term Goal 1 (Week 1): Pt will trasnfer to and from bed with min assist consistently with SB PT Short Term Goal 2 (Week 1): Pt will perform WC mobiltiy with supervision assist PT Short Term Goal 3 (Week 1): Pt will ambulate 58ft with max assist and LRAD  Skilled Therapeutic Interventions/Progress Updates:  Pt received seated in WC in room, reported 5/10 pain in L foot and had not received pain meds. Nursing notified to administer pain meds. Obtained ELR for LLE due to pain in L foot on foot rest. Emphasis of session on LLE weightbearing tolerance, transfers and global conditioning. Pt self-propelled in WC 20' w/BUEs and RLE, but pt refused to continue to propel due to "not being used to it". Encouraged pt to practice 2/2 likelihood he will be using WC at DC and living alone, but pt stated he "would try again later". Pt transported to ortho gym w/total A for time management.   In // bars for BLE strength, sit <>stand practice and pre-gait training: -Pt performed 10 sit <>stands w/BUE support on rails and CGA-min A. Noted pt only uses R rail and rotates completely to R side to pull himself to stand, pt reported he was doing this at baseline. Educated pt on pushing up from [facility] for safety, pt refused to try. Pt reported his "L arm is too weak for that", but pt demonstrates >3+/5 strength in LUE. Pt able to hold stand for 20-60s at a time and demonstrated severe L knee flexion , kyphotic posture, maintained hip flexion bilaterally, overreliance on BUEs and R trunk lean. Max verbal and tactile cues for upright posture and midline orientation, pt unable to maintain. Provided L HS stretch in between reps, but pt reported HS contracture is 66 years old and "will not  change". Progressed to having pt laterally weight shift while standing to promote LLE weightbearing and prime for gait training. Pt required mod tactile cues to shift from hips rather than shoulders and demonstrated forward flexed posture to lean on RUE while performing shifts. Pt stated he "has a mental block and I do not trust my legs". Pt able to take single step forward w/LLE towards end of session, but demonstrated severe forward flexed posture and trunk rotation to R side. Lengthy discussion w/pt regarding CLOF and not being safe to use SPC at home immediately upon DC, pt reported "his eyes being opened" to his level of deconditioning and verbally agreed w/therapist.   Pt transported back to room w/total A for time management and was left seated in WC in room, all needs in reach.   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 Required Braces or Orthoses: Other Brace Other Brace: L darco Restrictions Weight Bearing Restrictions: Yes LLE Weight Bearing: Partial weight bearing LLE Partial Weight Bearing Percentage or Pounds: wb on L heel   Therapy/Group: Individual Therapy Jill Alexanders Trenika Hudson, PT, DPT  02/26/2021, 7:44 AM

## 2021-02-26 NOTE — Progress Notes (Signed)
Inpatient Rehabilitation  Patient information reviewed and entered into eRehab system by Robbye Dede M. Andrena Margerum, M.A., CCC/SLP, PPS Coordinator.  Information including medical coding, functional ability and quality indicators will be reviewed and updated through discharge.    

## 2021-02-27 ENCOUNTER — Encounter (HOSPITAL_COMMUNITY): Payer: Medicare Other

## 2021-02-27 ENCOUNTER — Telehealth: Payer: Self-pay

## 2021-02-27 LAB — GLUCOSE, CAPILLARY
Glucose-Capillary: 103 mg/dL — ABNORMAL HIGH (ref 70–99)
Glucose-Capillary: 110 mg/dL — ABNORMAL HIGH (ref 70–99)
Glucose-Capillary: 125 mg/dL — ABNORMAL HIGH (ref 70–99)
Glucose-Capillary: 118 mg/dL — ABNORMAL HIGH (ref 70–99)

## 2021-02-27 MED ORDER — SORBITOL 70 % SOLN
30.0000 mL | Freq: Once | Status: AC
  Administered 2021-02-27: 15:00:00 30 mL via ORAL
  Filled 2021-02-27: qty 30

## 2021-02-27 MED ORDER — GUAIFENESIN-DM 100-10 MG/5ML PO SYRP
5.0000 mL | ORAL_SOLUTION | ORAL | Status: DC | PRN
  Administered 2021-02-27 – 2021-03-02 (×2): 5 mL via ORAL
  Filled 2021-02-27 (×2): qty 10

## 2021-02-27 NOTE — Patient Care Conference (Signed)
Inpatient RehabilitationTeam Conference and Plan of Care Update Date: 02/27/2021   Time: 12:14 PM    Patient Name: Adam Perez      Medical Record Number: [id number]  Date of Birth: 02/24/1956 Sex: Male         Room/Bed: 4M05C/4M05C-01 Payor Info: Payor: MEDICARE / Plan: MEDICARE PART A AND B / Product Type: *No Product type* /    Admit Date/Time:  02/23/2021  4:15 PM  Primary Diagnosis:  History of transmetatarsal amputation of left foot [facility])  Hospital Problems: Principal Problem:   History of transmetatarsal amputation of left foot [facility])    Expected Discharge Date: Expected Discharge Date: 03/07/21  Team Members Present: Physician leading conference: Dr. Genice Rouge Social Worker Present: Dossie Der, LCSW Nurse Present: Chana Bode, RN PT Present: Ernestene Kiel, PT OT Present: Primitivo Gauze, OT PPS Coordinator present : Fae Pippin, SLP     Current Status/Progress Goal Weekly Team Focus  Bowel/Bladder   Pt is currently continent of Bowel and Bladder  encourage use of BSC, Urinal  offer toileting multiple times qshift, PRN   Swallow/Nutrition/ Hydration             ADL's   min A slide board transfers, mod - max LB self care  CGA bathing, standing and toileting; min A toilet and shower transfer  ADL transfers and training, functional mobility, pt education, NMR   Mobility   Min A sit <> stand, min A WC  CGA transfers w/SB, mod I WC  Transfers, global conditioning, pt/fam edu, L NMR   Communication             Safety/Cognition/ Behavioral Observations            Pain   Pt has intermittent pain rating 4-8  Pt reports pain >4  assess pain levels qshift and PRN   Skin   Pt currently has staples on L foot from amputation; Pt has a wound vac on L posterior calf  Promote healing  skin assessment qshift, PRN, Wound care M/W/F     Discharge Planning:  Pt planning on going to separated wife's home she is taking FMLA to assist, pt realizes after evaluations how  much work he has to do to improve. He wants to be supervision-mod/i at DC   Team Discussion: Patient with elevated BUN per MD; encourage fluids, rechecking labs. Constipation addressed. Anticipate wound vac x 2 weeks.  Patient on target to meet rehab goals: yes, currently doing well overall given weight bearing limitations and wound vac and despite pain. Able to complete slide-board transfers and attempting transfers with [facility] on left with Darco shoe. Learned compensatory strategies from previous stroke being used; poor safety awareness noted.  Goals for discharge set for CGA/mod I with wheelchair.  *See Care Plan and progress notes for long and short-term goals.   Revisions to Treatment Plan:  N/A  Teaching Needs: Safety awareness, weight bearing limitations, transfers, toileting, skin care, secondary risk management, medication management, etc.  Current Barriers to Discharge: Decreased caregiver support and Weight bearing restrictions  Possible Resolutions to Barriers: Family education with ex-wife and daughter(s) DME recommended     Medical Summary Current Status: stroke 4 years ago- using oxy for pain- using for prn and dressing changes; LBM 3 days ago- continent bladder; L TMA_ has VAC being changed M/W/F  Barriers to Discharge: Other (comments);Wound care;Behavior;Decreased family/caregiver support;Home enviroment access/layout;Weight;Weight bearing restrictions;Medical stability  Barriers to Discharge Comments: has VAC- cannot tell when will be removed-  maybe 12/26?- going home with ex-wife divorced 20 yrs ago Possible Resolutions to [employer] Focus: limitations- poor safety awareness- thought could drive; pain; VAC; constipation;  SB CGA/mod I in w/c; is motivated just poor safety awareness- d/c 12/28-   Continued Need for Acute Rehabilitation Level of Care: The patient requires daily medical management by a physician with specialized training in physical medicine and  rehabilitation for the following reasons: Direction of a multidisciplinary physical rehabilitation program to maximize functional independence : Yes Medical management of patient stability for increased activity during participation in an intensive rehabilitation regime.: Yes Analysis of laboratory values and/or radiology reports with any subsequent need for medication adjustment and/or medical intervention. : Yes   I attest that I was present, lead the team conference, and concur with the assessment and plan of the team.   Chana Bode B 02/27/2021, 12:36 PM

## 2021-02-27 NOTE — Progress Notes (Signed)
Physical Therapy Session Note  Patient Details  Name: Adam Perez MRN: [id number] Date of Birth: 09/02/1955  Today's Date: 02/27/2021 PT Individual Time: 0902-0959 PT Individual Time Calculation (min): 57 min   Short Term Goals: Week 1:  PT Short Term Goal 1 (Week 1): Pt will trasnfer to and from bed with min assist consistently with SB PT Short Term Goal 2 (Week 1): Pt will perform WC mobiltiy with supervision assist PT Short Term Goal 3 (Week 1): Pt will ambulate 50ft with max assist and LRAD  Skilled Therapeutic Interventions/Progress Updates:  Pt received seated in WC in room, reported 7/10 pain in L foot and R shoulder and requested pain meds. Nursing present to administer meds and offered repositioning, distraction and modalities throughout session for pain modulation. Emphasis of session on slide board transfers and WC mobility. Pt self-propelled >75' slowly using BUEs and RLE w/S*, mod verbal cues for reminders to use RLE and to avoid obstacles on L side. Noted poor posture in WC, w/pelvis windswept to R side and significant trunk lean to L, pt refused to correct posture due to "protecting his arms". Pt transported rest of way to ortho gym for time management.   Practiced car transfer x2 w/sliding board w/max encouragement due to pt stating "he could drive himself" and did not need to practice on the passenger's side. Informed pt it was not safe for him to drive at this time, which pt did not agree with. Referred him to speak to his physician for clarification. Attempted to educate pt on slide board transfers, but pt consistently interrupting therapist to state "he has done this for 4 years, they just put my legs in there for me". Informed pt his current situation is different and thus his transfers must be different. Pt reported he will be walking w/a cane at DC, educated pt on his current mobility status and informed him that walking was unsafe at this time. Allowed pt to perform slide  board transfer his way, requiring mod A for assistance due to poor positioning of chair/board and improper technique. Pt able to bring BLEs into car on his own, but required max encouragement to perform due to him wanting therapist to do it for him. While coming out of car, pt refused to bring BLEs out first and essentially slid out of ar backwards, requiring mod A for safety. Provided constructive feedback to pt and educated him on proper set-up and technique and pt was able to perform car transfer again w/CGA, setting up the board himself. Pt verbalized frustration that therapist "let him fail" on first attempt, reminded pt that he would not listen to therapist's cues. Pt verbalized agreement.   Pt self-propelled >50' back to room slowly using BLEs and RLE, same pelvic and trunk positioning as listed above. Pt frequently complaining of R shoulder pain throughout, but when asked pain level would report none. Pt transported remainder of way to room w/total A 2/2 fatigue and pt was left seated in WC in room, all needs in reach.   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

## 2021-02-27 NOTE — Progress Notes (Signed)
Occupational Therapy Session Note  Patient Details  Name: Adam Perez MRN: [id number] Date of Birth: 26-Mar-1955  Today's Date: 02/27/2021 OT Individual Time: 1607-3710 session 1 OT Individual Time Calculation (min): 56 min  Session 2: 1330-1434 (64 mins)   Short Term Goals: Week 1:  OT Short Term Goal 1 (Week 1): Pt will independtly recall LLE PWB restrictions. OT Short Term Goal 2 (Week 1): Pt will don pants with max A + AE PRN. OT Short Term Goal 3 (Week 1): Pt will complete toilet transfer with max A and LRAD. OT Short Term Goal 4 (Week 1): Pt will complete standing grooming task with no more than min cues to adhere to LLE PWB precautions.  Skilled Therapeutic Interventions/Progress Updates:  Session 1: Pt greeted seated in w/c finishing breakfast agreeable to OT intervention. Session focus on BADL reeducation, functional mobility and decreasing overall burden of care. Started session by donning pts DARCO shoe from w/c with total A, pt completed w/c propulsion to sink with supervision for bathing/ grooming tasks. Pt completed oral care MOD I from w/c level.  Pt completed UB bathing from sink with supervision,  MIN A for LB bathing needing the stedy to stand at the sink. Pt able to stand with CGA but needed MINA for clothing mgmt on L side in standing. Pt completed UB dressing with set- up assist. Pt transported to gym with total A where remainder of session to focus on functional SB transfers from EOM<>w/c. Pt completed SB transfer to R and L with overall CGA, pt even able to place SB this session. Pt transported back to room with total A where pt left up in w/c with chair alarm activated and all needs within reach.                          Session 2: Pt greeted seated in w/c agreeable to OT intervention. Session focus on  functional transfers, core strength, BUE therex and decreasing overall burden of care. Pt with a lot of questions about why we haven't practiced walking, education  provided on pts WB restrictions and d/t pts prior CVA pt presents with ABD L hip making standing/ ambulation difficult/ unsafe. Pt very insistent on trying standing. Transported pt to gym with total A where pt was able to stand in stedy with CGA with mirror placed in front of pt to provide visual feedback on pts current body mechanics. Pt stood with CGA but then noted to realize how much pt relies on BUE as well as positioning of  LLE. Pt verbalizes understanding and reasoning for currently using SB transfers for mobility. Pt completed SB transfer from w/c>EOM with CGA going to R side. Pt worked on BUE/ core strength and endurance via ball toss therapeutic activity to trampoline. Pt completed tosses for 2 mins total, graded task up and had pt completed modified crunch after each ball toss with pt completing task for 2 mins total. Pt completed below BUE therex with 5lb dowel rod to increase BUE strength and endurance. 2x10 bicep curls 2x10 forward rows Pt transported back to room with total A where pt left up in w/c with chair alarm activated and all needs within reach.                          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      Therapy/Group: Individual Therapy  Pollyann Glen [facility] 02/27/2021, 9:05 AM

## 2021-02-27 NOTE — Consult Note (Signed)
WOC Nurse Consult Note: Consult received to NPWT change to left leg on MWF. This patient is already established for this and was seen yesterday for a vac change. WOC is following MWF.  Renaldo Reel Katrinka Blazing, MSN, RN, CMSRN, Angus Seller, [facility] Wound Treatment Associate Pager [phone]

## 2021-02-27 NOTE — Progress Notes (Signed)
Patient ID: Adam Perez, male   DOB: September 17, 1955, 65 y.o.   MRN: [id number] Met with the patient to introduce self and review role, rehab routine, team conference and plan of care. Discussed wound care and secondary risk management including DM (A1C 6.1), and HTN. Reviewed dietary modifications recommended including increased protein and calcium. Reported wound dressing change is painful and aware of the need to transfer with the darco shoe on to support PWB through heel only.  Wife to bring in [facility] paperwork and patient asking about a need to establish a PCP locally at discharge in addition to the [place] [facility] in [place]. Question deferred to MD. Continue to follow along to discharge to address educational needs. Collaborate with the team to facilitate preparation for discharge. Margarito Liner

## 2021-02-27 NOTE — Progress Notes (Deleted)
Patient underwent left lower leg debridement with application of skin substitute (Myriad matrix) and application of wound VAC by Dr. Stanford Breed on 02/19/2021.  Plan was to have wound VAC changes on M/W/F after admission to rehab. Dicussed with Dr. Dagoberto Ligas and Coralyn Helling, RN (Myriad rep) regarding wound care. I have consulted wound care team to begin [facility] dressing changes tomorrow. Xeroform was applied over Myriad matrix and this needs to be left in place at time of VAC change.

## 2021-02-27 NOTE — Progress Notes (Signed)
Patient ID: Adam Perez, male   DOB: 06-Oct-1955, 65 y.o.   MRN: [id number]  Met with pt to discuss team conference goals of CGA transfers and mod/I wheelchair level, he is doing well and improving daily. MD is hoping the wound vac will be discharged prior to going home but is not sure. Pt will need equipment and follow up will begin working on. His wife to bring in [facility] paperwork tonight. Will update her as well and discuss family training prior to discharge home.

## 2021-02-27 NOTE — Telephone Encounter (Signed)
Patient's wife Harless Molinari brought in FMLA ppw work to request time off to care for her husband after his surgery. I notified Mrs. Burdin that the [employer] has been completed, faxed and a confirmation has been submitted. She voiced her understanding.

## 2021-02-27 NOTE — Progress Notes (Signed)
Physical Therapy Session Note  Patient Details  Name: Adam Perez MRN: [id number] Date of Birth: 08/26/1955  Today's Date: 02/27/2021 PT Individual Time: 1530-1600 PT Individual Time Calculation (min): 30 min   Short Term Goals: Week 1:  PT Short Term Goal 1 (Week 1): Pt will trasnfer to and from bed with min assist consistently with SB PT Short Term Goal 2 (Week 1): Pt will perform WC mobiltiy with supervision assist PT Short Term Goal 3 (Week 1): Pt will ambulate 85ft with max assist and LRAD  Skilled Therapeutic Interventions/Progress Updates:    Pt received seated in w/c in room, agreeable to PT session. Pt reports 3/10 pain in LLE at rest, declines intervention and no increase in pain during session. Sit to stand in // bars with CGA x 5 reps. Pt only weight bearing through RLE in standing, encouraged stance on LLE but due to hamstring and gastroc tightness he is unable to stand on heel and only is able to put weight through toes which is against weight-bearing precautions. Per pt he is taking Baclofen to assist with spasticity and tightness in LLE. Education with patient that he will most likely be at w/c level and not a functional ambulator upon d/c home from hospital due to spasticity and tightness in LLE and inability to maintain weight-bearing precautions in standing. Per pt he "dragged" his LLE around prior to this surgery, educated him that continuing to ambulate in this manner would lead to another wound on LLE and further surgeries. Pt understanding of education. Standing LLE therex: marches, hip abd, HS curls, hip ext 2 x 10 reps each. Manual w/c propulsion x 150 ft at Supervision level with use of R UE/LE. Pt left seated in w/c in room with needs in reach, chair alarm in place at end of session.  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

## 2021-02-27 NOTE — Progress Notes (Signed)
PROGRESS NOTE   Subjective/Complaints:  Pt reports had ot change "wrapping" on L foot TMA due to drainage.  Still has VAC-  Still having some pain- pain meds help.   LBM 12/17- small- will order sorbitol  this afternoon.  Sounds stuffed up- pt said isn't, but will take robitussin just in case prn.   ROS:  Pt denies SOB, abd pain, CP, N/V/C/D, and vision changes  Objective:   No results found. Recent Labs    02/26/21 0625  WBC 8.3  HGB 9.3*  HCT 27.9*  PLT 433*   Recent Labs    02/26/21 0625  NA 135  K 4.2  CL 104  CO2 25  GLUCOSE 107*  BUN 25*  CREATININE 1.10  CALCIUM 8.6*    Intake/Output Summary (Last 24 hours) at 02/27/2021 0831 Last data filed at 02/27/2021 0743 Gross per 24 hour  Intake 720 ml  Output 1850 ml  Net -1130 ml        Physical Exam: Vital Signs Blood pressure 131/85, pulse 84, temperature 98.1 F (36.7 C), resp. rate 14, height 5' 7"  (1.702 m), weight 104.3 kg, SpO2 99 %.    General: awake, alert, appropriate, sitting in bed; NAD HENT: conjugate gaze; oropharynx moist CV: regular rate; no JVD Pulmonary: CTA B/L; no W/R/R- good air movement GI: soft, NT, ND, (+)BS- hypoactive Psychiatric: appropriate Neurological: Ox3; slightly delayed in responses  Ext: no clubbing, cyanosis  Psych: pleasant and cooperative  Extremities:LLE 1+ edema/splint Skin: No evidence of breakdown, no evidence of rash, right foot dry, left leg dressed/vac Neurologic: Cranial nerves II through XII intact, motor strength is 5/5 in Right , 3- Left deltoid, bicep, tricep, grip, hip flexor, knee extensors, L ankle dorsiflexor and plantar flexor not tested due to dressing wound vac  Musculoskeletal: Left TMA with wound vac, leg quite tender to manipulation still- some drainage in VAC noted   Assessment/Plan: 1. Functional deficits which require 3+ hours per day of interdisciplinary therapy in a  comprehensive inpatient rehab setting. Physiatrist is providing close team supervision and 24 hour management of active medical problems listed below. Physiatrist and rehab team continue to assess barriers to discharge/monitor patient progress toward functional and medical goals  Care Tool:  Bathing    Body parts bathed by patient: Right arm, Left arm, Chest, Abdomen, Front perineal area, Right upper leg, Left upper leg   Body parts bathed by helper: Buttocks, Right lower leg Body parts n/a: Left lower leg   Bathing assist Assist Level: Moderate Assistance - Patient 50 - 74%     Upper Body Dressing/Undressing Upper body dressing   What is the patient wearing?: Hospital gown only, Pull over shirt    Upper body assist Assist Level: Supervision/Verbal cueing (EOB)    Lower Body Dressing/Undressing Lower body dressing      What is the patient wearing?: Pants     Lower body assist Assist for lower body dressing: Total Assistance - Patient < 25%     Toileting Toileting    Toileting assist Assist for toileting: Independent with assistive device Assistive Device Comment: urinal   Transfers Chair/bed transfer  Transfers assist     Chair/bed  transfer assist level: Moderate Assistance - Patient 50 - 74%     Locomotion Ambulation   Ambulation assist   Ambulation activity did not occur: Safety/medical concerns          Walk 10 feet activity   Assist  Walk 10 feet activity did not occur: Safety/medical concerns        Walk 50 feet activity   Assist Walk 50 feet with 2 turns activity did not occur: Safety/medical concerns         Walk 150 feet activity   Assist Walk 150 feet activity did not occur: Safety/medical concerns         Walk 10 feet on uneven surface  activity   Assist Walk 10 feet on uneven surfaces activity did not occur: Safety/medical concerns         Wheelchair     Assist Is the patient using a wheelchair?: Yes Type  of Wheelchair: Manual    Wheelchair assist level: Minimal Assistance - Patient > 75% Max wheelchair distance: 150    Wheelchair 50 feet with 2 turns activity    Assist        Assist Level: Minimal Assistance - Patient > 75%   Wheelchair 150 feet activity     Assist      Assist Level: Minimal Assistance - Patient > 75%   Blood pressure 131/85, pulse 84, temperature 98.1 F (36.7 C), resp. rate 14, height 5' 7"  (1.702 m), weight 104.3 kg, SpO2 99 %.  Medical Problem List and Plan: 1. 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                -patient may shower if LLE is covered             -ELOS/Goals: 10-12 days, supervision to min assist goals with PT, OT  Con't CIR- PT and OT- team conference today to f/u on plan/d/c date 2.  Antithrombotics: -DVT/anticoagulation:  Pharmaceutical: Lovenox             -antiplatelet therapy: Aspirin 81 mg daily and Plavix 25 mg daily 3. Pain Management: Oxycodone/Robaxin as needed  12/20- pain controlled- con't regimen 4. Mood: Zoloft 150 mg daily, trazodone 100 mg nightly             -antipsychotic agents: Latuda 20 mg daily 5. Neuropsych: This patient is capable of making decisions on his own behalf. 6. Skin/Wound Care:  continue vac per surgery             -added eucerin cream for RLE 7. Fluids/Electrolytes/Nutrition: eating fairly well  12/19 -pre-renal azotemia--by labs   -push fluids and recheck later this week 8.  Acute blood loss anemia.  Follow-up CBC 9.  Diabetes mellitus.  Hemoglobin A1c 6.1.  Semglee 10 units nightly.  Check blood sugars before meals and at bedtime.  Diabetic teaching CBG (last 3)  Recent Labs    02/26/21 1657 02/26/21 2111 02/27/21 0601  GLUCAP 119* 116* 103*  12/20- CBGs controlled- con't regimen 10.  Hypertension.  Norvasc 10 mg daily.  Monitor with  increased mobility Vitals:   02/26/21 2009 02/27/21 0520  BP: 119/88 131/85  Pulse: 83 84  Resp: 18 14  Temp: 97.7 F (36.5 C) 98.1 F (36.7 C)  SpO2: 99% 99%    11.  Hyperlipidemia.  Lipitor 12.  History of CVA with residual left-sided weakness.  Continue aspirin and Plavix 13.  Morbid obesity.  BMI 37.74.  Dietary follow-up 14.  Constipation.  MiraLAX twice daily as needed.              -had bm 12/17--change senna-s to 2 tabs scheduled at HS  12/20- LBM 12/17- was small- will order sorbitol after therapy today.   15. Wound VAC  12/20- will check with Vascular/Ortho about VAC- had Myriad molecules- surgery 12/12- 2 weeks is 12/26- to not be changed.   LOS: 4 days A FACE TO FACE EVALUATION WAS PERFORMED  Colin Ellers 02/27/2021, 8:31 AM

## 2021-02-28 ENCOUNTER — Encounter (HOSPITAL_COMMUNITY): Payer: Self-pay | Admitting: Physical Medicine and Rehabilitation

## 2021-02-28 LAB — GLUCOSE, CAPILLARY
Glucose-Capillary: 107 mg/dL — ABNORMAL HIGH (ref 70–99)
Glucose-Capillary: 120 mg/dL — ABNORMAL HIGH (ref 70–99)
Glucose-Capillary: 119 mg/dL — ABNORMAL HIGH (ref 70–99)
Glucose-Capillary: 160 mg/dL — ABNORMAL HIGH (ref 70–99)

## 2021-02-28 NOTE — Progress Notes (Signed)
Physical Therapy Session Note  Patient Details  Name: JALON SQUIER MRN: [id number] Date of Birth: 1955-06-09  Today's Date: 02/28/2021 PT Individual Time: 3220-2542 PT Individual Time Calculation (min): 53 min   Short Term Goals: Week 1:  PT Short Term Goal 1 (Week 1): Pt will trasnfer to and from bed with min assist consistently with SB PT Short Term Goal 2 (Week 1): Pt will perform WC mobiltiy with supervision assist PT Short Term Goal 3 (Week 1): Pt will ambulate 98f with max assist and LRAD  Skilled Therapeutic Interventions/Progress Updates: Pt presented in w/c agreeable to therapy. Pt states increased pain due to recently having wound vac changed but did not rate and did not request intervention throughout session. Pt transported to ortho gym for time management and performed Slide board transfer to mat with CGA and verbal cues for set up. Pt initially required cues for improved hand placement on Slide board as pt pulling board and causing board to tilt forward. Once explained to pt was able to improve technique (although does lean towards side that he's transferring to vs away). Pt then participated in seated balance, UE activities for general conditioning. Pt participated in lateral leans x 5 with pt able to get L elbow onto mat with encouragement. Pt also participated in trunk rotations with emphasis on turning at waist vs only rotating neck/shoulders. Pt also performed chest press with 4lb dowel, bicep curls with 4lb dowel, shoulder abd with dowel, all performed to fatigue ~20 each. Pt encouraged to perform with control vs momentum. Pt then expressing that he feels that he's in bed a lot and is causing back tightness/pain. Went through forward flexion stretch with physioball with 10-15sec hold at end range. Pt then performed Slide board transfer to return to w/c with set up assist but pt able to place Slide board with only verbal cues. Pt performed transfer to w/c on level surface  demonstrating good safety overall. Pt propelled back to room with supervision overall and upon return to room pt left in w/c with seat alarm on, call bell within reach and needs met.      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

## 2021-02-28 NOTE — Consult Note (Addendum)
[place] Nurse wound follow up Patient receiving care in [facility] 4M05 Wound type: Surgical Measurement: 9.8 cm x 10 cm x 0.1 cm Wound bed: Beefy red with myriad skin substitute present Drainage (amount, consistency, odor) Pink/red purulent Periwound: intact Dressing procedure/placement/frequency: Ace wrap and kerlix removed with one piece of black foam dressing. Surrounding skin was cleaned and coated with skin barrier wipes. 2 pieces of adaptic placed over the wound, followed by hydrogel and one piece of black foam. Drape applied and immediate suction obtained at 125 mmHg. 1 piece of adaptic placed over the staples at the amputation site. Wrapped the LE from the tip to the knee down to the amputation site with Kerlix and Ace wrap. Supplies in top drawer at the window. Patient will need to be pre-medicated prior to dressing change.    WOC will follow MWF   Jocelyn Lamer L. Tamala Julian, MSN, RN, [place], Lysle Pearl, [facility] Wound Treatment Associate Pager [phone]

## 2021-02-28 NOTE — Progress Notes (Signed)
Physical Therapy Session Note  Patient Details  Name: Adam Perez MRN: [id number] Date of Birth: Jul 27, 1955  Today's Date: 02/28/2021 PT Individual Time: 3419-3790 PT Individual Time Calculation (min): 55 min   Short Term Goals: Week 1:  PT Short Term Goal 1 (Week 1): Pt will trasnfer to and from bed with min assist consistently with SB PT Short Term Goal 2 (Week 1): Pt will perform WC mobiltiy with supervision assist PT Short Term Goal 3 (Week 1): Pt will ambulate 45ft with max assist and LRAD  Skilled Therapeutic Interventions/Progress Updates:  Pt received seated in WC in room, reported 3/10 pain in L ankle and was premedicated. Offered light stretching and distraction throughout session for pain modulation. Emphasis of session on BUE strength, lateral weight shifting, and WC management. Donned leg rests w/total A and pt self-propelled using hemi-technique (R side) slowly >150', required several rest breaks due to RUE pain. Pt demonstrated very poor posture in chair, pelvis windswept to R and significant L trunk lean. Educated pt on importance of posture in [facility] for improved self-propulsion and reducing risk of pain and pressure injury, pt refused to correct pelvic position and stated he was "sitting like this for a reason". Pt did not elaborate on his reason.   Pt performed lateral board transfer from [facility] to mat on R side w/CGA, pt able to set-up transfer independently. At edge of mat, practiced lateral weight shift onto elbows to prime for donning/doffing pants on BSC. Pt able to lean to L &R x15 each direction and demonstrated adequate strength to push up from elbow on L side. Attempted to progress to fwd/retro scooting on mat for continued lateral weight shift, but pt unable to demonstrate carryover from previous intervention to complete task. Provided mod verbal and visual cues for sequencing and performance of task, but pt unable to perform. Performed lateral board transfer to L side from  mat to chair w/min A for stabilization of board and hip management. Once in chair, min verbal cues to scoot hips to L to promote upright posture and midline orientation, but pt unable to perform and declined therapist assistance.   Pt self-propelled >100' towards day room via hemi-technique w/S* slowly and demonstrated significant L path deviations. Provided mirror for pt to visualize his poor pelvic alignment, but pt continued to refuse repositioning. Transported pt remainder of way to day room for time management. Pt performed the following exercises while seated in WC for improved LLE strength and BUE strength: -LAQ x20 on L side. Pt able to obtain <30% of full ROM. Noted significant ER of hip while performing  -Seated marches x20 on LLE and noted continued ER of hip   -WC pushups, 2x10. Pt demonstrated exaggerated forward flexed posture and unable to lift hips completely off chair. Provided mod verbal and visual cues for proper form, but pt unable to follow. Lengthy discussion regarding importance of intervention for strength w/weightshifting and transfers, pt reported it was "eye opening" how deconditioned he is. Provided encouragement regarding pt progress.   Attempted to perform HS stretch to LLE while simultaneously correcting pt's hip position to reduce ER w/knee and hip flexion, pt unable to tolerate due to pain. Pt transported back to room w/total A 2/2 fatigue and was left seated in WC in room, all needs in reach.  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

## 2021-02-28 NOTE — Progress Notes (Signed)
PROGRESS NOTE   Subjective/Complaints:  Pain 4-5/10- pain- doing better.  Feels like could have BM.  Ate 100% breakfast tray.    ROS:   Pt denies SOB, abd pain, CP, N/V/C/D, and vision changes   Objective:   No results found. Recent Labs    02/26/21 0625  WBC 8.3  HGB 9.3*  HCT 27.9*  PLT 433*   Recent Labs    02/26/21 0625  NA 135  K 4.2  CL 104  CO2 25  GLUCOSE 107*  BUN 25*  CREATININE 1.10  CALCIUM 8.6*    Intake/Output Summary (Last 24 hours) at 02/28/2021 0813 Last data filed at 02/28/2021 0540 Gross per 24 hour  Intake 600 ml  Output 2200 ml  Net -1600 ml        Physical Exam: Vital Signs Blood pressure 121/67, pulse 87, temperature 98.6 F (37 C), resp. rate 17, height 5' 7"  (1.702 m), weight 104.3 kg, SpO2 100 %.    General: awake, alert, appropriate, sitting up in bedside chair;  NAD HENT: conjugate gaze; oropharynx moist CV: regular rate; no JVD Pulmonary: CTA B/L; no W/R/R- good air movement GI: soft, NT, ND, (+)BS Psychiatric: appropriate Neurological: alert; slightly delayed in responses MS: VAC on LLE- with ACE wrap overlying VAC.  Ext: no clubbing, cyanosis  Psych: pleasant and cooperative  Extremities:LLE 1+ edema/splint Skin: No evidence of breakdown, no evidence of rash, right foot dry, left leg dressed/vac Neurologic: Cranial nerves II through XII intact, motor strength is 5/5 in Right , 3- Left deltoid, bicep, tricep, grip, hip flexor, knee extensors, L ankle dorsiflexor and plantar flexor not tested due to dressing wound vac  Musculoskeletal: Left TMA with wound vac, leg quite tender to manipulation still- some drainage in VAC noted   Assessment/Plan: 1. Functional deficits which require 3+ hours per day of interdisciplinary therapy in a comprehensive inpatient rehab setting. Physiatrist is providing close team supervision and 24 hour management of active medical  problems listed below. Physiatrist and rehab team continue to assess barriers to discharge/monitor patient progress toward functional and medical goals  Care Tool:  Bathing    Body parts bathed by patient: Right arm, Left arm, Chest, Abdomen, Front perineal area, Right upper leg, Left upper leg, Face, Buttocks   Body parts bathed by helper: Buttocks, Right lower leg Body parts n/a: Left lower leg   Bathing assist Assist Level: Minimal Assistance - Patient > 75%     Upper Body Dressing/Undressing Upper body dressing   What is the patient wearing?: Pull over shirt    Upper body assist Assist Level: Set up assist    Lower Body Dressing/Undressing Lower body dressing      What is the patient wearing?: Pants     Lower body assist Assist for lower body dressing: Minimal Assistance - Patient > 75%     Toileting Toileting    Toileting assist Assist for toileting: Independent with assistive device Assistive Device Comment: urinal   Transfers Chair/bed transfer  Transfers assist     Chair/bed transfer assist level: Contact Guard/Touching assist (SB transfers)     Locomotion Ambulation   Ambulation assist   Ambulation activity did  not occur: Safety/medical concerns          Walk 10 feet activity   Assist  Walk 10 feet activity did not occur: Safety/medical concerns        Walk 50 feet activity   Assist Walk 50 feet with 2 turns activity did not occur: Safety/medical concerns         Walk 150 feet activity   Assist Walk 150 feet activity did not occur: Safety/medical concerns         Walk 10 feet on uneven surface  activity   Assist Walk 10 feet on uneven surfaces activity did not occur: Safety/medical concerns         Wheelchair     Assist Is the patient using a wheelchair?: Yes Type of Wheelchair: Manual    Wheelchair assist level: Supervision/Verbal cueing Max wheelchair distance: 75    Wheelchair 50 feet with 2 turns  activity    Assist        Assist Level: Supervision/Verbal cueing   Wheelchair 150 feet activity     Assist      Assist Level: Moderate Assistance - Patient 50 - 74%   Blood pressure 121/67, pulse 87, temperature 98.6 F (37 C), resp. rate 17, height 5' 7"  (1.702 m), weight 104.3 kg, SpO2 100 %.  Medical Problem List and Plan: 1. 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                -patient may shower if LLE is covered             -ELOS/Goals: 10-12 days, supervision to min assist goals with PT, OT  Con't CIR-d/c date set for 12/28 Con't CIR- PT and OT 2.  Antithrombotics: -DVT/anticoagulation:  Pharmaceutical: Lovenox             -antiplatelet therapy: Aspirin 81 mg daily and Plavix 25 mg daily 3. Pain Management: Oxycodone/Robaxin as needed  12/21- pain controlled- 4-5/10- con't regimen 4. Mood: Zoloft 150 mg daily, trazodone 100 mg nightly             -antipsychotic agents: Latuda 20 mg daily 5. Neuropsych: This patient is capable of making decisions on his own behalf. 6. Skin/Wound Care:  continue vac per surgery             -added eucerin cream for RLE 7. Fluids/Electrolytes/Nutrition: eating fairly well  12/19 -pre-renal azotemia--by labs   -push fluids and recheck later this week  12/21- will check BMP in AM- last BUN 25-  8.  Acute blood loss anemia.  Follow-up CBC 9.  Diabetes mellitus.  Hemoglobin A1c 6.1.  Semglee 10 units nightly.  Check blood sugars before meals and at bedtime.  Diabetic teaching CBG (last 3)  Recent Labs    02/27/21 1645 02/27/21 2100 02/28/21 0611  GLUCAP 125* 118* 107*  12/21- CBGs controlled- con't regimen 10.  Hypertension.  Norvasc 10 mg daily.  Monitor with increased mobility Vitals:   02/27/21 2032 02/28/21 0539  BP: 122/82 121/67  Pulse: 85 87  Resp: 18 17  Temp: 98.1 F  (36.7 C) 98.6 F (37 C)  SpO2: 100% 100%    11.  Hyperlipidemia.  Lipitor 12.  History of CVA with residual left-sided weakness.  Continue aspirin and Plavix 13.  Morbid obesity.  BMI 37.74.  Dietary follow-up 14.  Constipation.  MiraLAX twice daily as  needed.              -had bm 12/17--change senna-s to 2 tabs scheduled at HS  12/20- LBM 12/17- was small- will order sorbitol after therapy today.   15. Wound VAC  12/20- will check with Vascular/Ortho about VAC- had Myriad molecules- surgery 12/12- 2 weeks is 12/26- to not be removed, but changed M/W/F  12/21- change M/W/F- informed pt- remove 12/26   LOS: 5 days A FACE TO FACE EVALUATION WAS PERFORMED  Adam Perez 02/28/2021, 8:13 AM

## 2021-02-28 NOTE — Progress Notes (Signed)
Pt requested for his Darco boot to be removed. After removing this nurse noticed the gauze at the incision site of the trans metatarsals were soiled with sanguineous drainage. Pt was given pain meds, foot and amputation sight was cleansed with NS and guaze and dressed with adaptic, guaze, and new ace wrap bandage. Pt denies any pain "outside of his normal pain". Pt left in wheelchair, call light in reach, denies any other needs.

## 2021-02-28 NOTE — Progress Notes (Signed)
Physical Therapy Session Note  Patient Details  Name: Adam Perez MRN: [id number] Date of Birth: 11/24/1955  Today's Date: 02/28/2021 PT Individual Time: 4854-6270 PT Individual Time Calculation (min): 60 min   Short Term Goals: Week 1:  PT Short Term Goal 1 (Week 1): Pt will trasnfer to and from bed with min assist consistently with SB PT Short Term Goal 2 (Week 1): Pt will perform WC mobiltiy with supervision assist PT Short Term Goal 3 (Week 1): Pt will ambulate 68ft with max assist and LRAD  Skilled Therapeutic Interventions/Progress Updates:    Pt received asleep, sitting in w/c and upon awakening agreeable to therapy session. Pt already wearing L LE DARCO shoe. Seated, continent of bladder using urinal without assist. Pt noted to sit in w/c with R pelvis forward and depressed compared to L - cued pt to scoot back in chair with pt demoing poor motor planning/technique for scooting by leaning trunk posteriorly and pushing with his R arm, but pt with limited ability to correct despite verbal cuing, manual facilitation, and visual demonstration to lean trunk forward and push through his feet.  R hemi-technique w/c propulsion ~123ft to main therapy gym requiring min assist after ~29ft because pt could not get enough traction with R heel to turn towards the R - pt consistently demonstrates difficulty making R turns during propulsion.  Pt able to set-up w/c for R lateral scoot transfer to EOM with min verbal cuing for w/c positioning - manages armrest without cuing - requires total assist for L LE leg rest. Placed transfer board with min cuing for proper positioning as pt does not initially place it underneath his hip far enough. R lateral scoot transfer w/c>EOM with CGA - continues to have poor motor planning/sequencing of how to scoot and instead compensates by using his R arm to pull/slide his hips across the board.   Sit>supine with supervision and pt requiring increased time to bring L LE  up onto mat. Supine L LE hip/knee stretch into extension via supine lying for 2 minutes progressed to supine L LE quad sets 2x10 reps with 3 sec holds - pt with significant difficulty motor planning how to activate quad muscle vs hamstrings requiring multimodal cuing.  Supine>sit L EOM supervision. Sitting EOM L LE long arc quads 2x10 reps working on quad activation and decreased L LE flexor tone - provided external target to promote increased knee extension ROM due to limited quad activation noted as pt attempting to compensate with hip flexion.   Pt suddenly started to have what he called "involuntary movements" in his L lower leg with pt's ankle having a clonus type movement with rapid, alternating ankle DF vs PF activation; however, when thearpist lifted his leg to remove any potential stretch on gastroc the muscle spasm did not stop - this lasted ~2 minutes.  L lateral scoot transfer EOM>w/c with pt able to position transfer board without assist and continues to compensate by pushing with his R arm and sliding his hips across the board as opposed to leaning his trunk forward to off-weight his hips.  Therapist attached dysem to bottom of pt's shoe for improved traction. R hemi-technique w/c propulsion ~157ft back to room with pt having improved propulsion ability and improved R turns though still significantly challenging and requiring increased time. Pt left seated in w/c with needs in reach and chair alarm on.    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   Therapy/Group: Individual Therapy  Ginny Forth , PT, DPT, NCS, CSRS  02/28/2021, 3:47 PM

## 2021-02-28 NOTE — Progress Notes (Signed)
Occupational Therapy Session Note  Patient Details  Name: Adam Perez MRN: [id number] Date of Birth: Feb 11, 1956  Today's Date: 02/28/2021 OT Individual Time: 0830-0900 OT Individual Time Calculation (min): 30 min    Short Term Goals: Week 1:  OT Short Term Goal 1 (Week 1): Pt will independtly recall LLE PWB restrictions. OT Short Term Goal 2 (Week 1): Pt will don pants with max A + AE PRN. OT Short Term Goal 3 (Week 1): Pt will complete toilet transfer with max A and LRAD. OT Short Term Goal 4 (Week 1): Pt will complete standing grooming task with no more than min cues to adhere to LLE PWB precautions.  Skilled Therapeutic Interventions/Progress Updates:    Pt received in wc waiting for wound care team.  Used this short session to plan how pt will manage toileting and LB dressing at home.  Prior to foot amputation, pt stated that he would just stand up by himself and pull his pants up. As of now, pt is requiring the stedy lift to stand as he is not able to put full wt on his L foot and he has L side hypertone.  Discussed the safest method would be lateral leans to adjust clothing and cleanse his bottom. Pt practiced this method in wc.  Had pt remove B arm rests so he could get a wider lean and lean towards bed on his R and heavy arm chair on his L.   Pt drew a diagram of his home bathroom. Pt will need to have wc at an angle to his toilet and use board to his L to get on the toilet.  Pt will need a wide drop arm commode as the surface is flat and much safer for him to use with a board.   Pt will benefit from more practice with lateral leans.  At end of session, wound care nurse arrived. Pt transferred using board to bed with S.  Pt in care with RN.  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

## 2021-03-01 LAB — BASIC METABOLIC PANEL
Anion gap: 5 (ref 5–15)
BUN: 24 mg/dL — ABNORMAL HIGH (ref 8–23)
CO2: 25 mmol/L (ref 22–32)
Calcium: 8.9 mg/dL (ref 8.9–10.3)
Chloride: 105 mmol/L (ref 98–111)
Creatinine, Ser: 1.09 mg/dL (ref 0.61–1.24)
GFR, Estimated: >60 mL/min (ref >60)
Glucose, Bld: 107 mg/dL — ABNORMAL HIGH (ref 70–99)
Potassium: 4.2 mmol/L (ref 3.5–5.1)
Sodium: 135 mmol/L (ref 135–145)

## 2021-03-01 LAB — GLUCOSE, CAPILLARY
Glucose-Capillary: 108 mg/dL — ABNORMAL HIGH (ref 70–99)
Glucose-Capillary: 107 mg/dL — ABNORMAL HIGH (ref 70–99)
Glucose-Capillary: 93 mg/dL (ref 70–99)
Glucose-Capillary: 183 mg/dL — ABNORMAL HIGH (ref 70–99)

## 2021-03-01 MED ORDER — SENNOSIDES-DOCUSATE SODIUM 8.6-50 MG PO TABS
2.0000 | ORAL_TABLET | Freq: Every day | ORAL | Status: DC
  Administered 2021-03-01 – 2021-03-06 (×5): 2 via ORAL
  Filled 2021-03-01 (×6): qty 2

## 2021-03-01 NOTE — Progress Notes (Signed)
Occupational Therapy Weekly Progress Note  Patient Details  Name: Adam Perez MRN: [id number] Date of Birth: 11-22-1955  Beginning of progress report period: February 24, 2021 End of progress report period: March 01, 2021  Today's Date: 03/01/2021 OT Individual Time: 1100-1200 OT Individual Time Calculation (min): 60 min    Patient has met 3 of 4 short term goals.  Pt is making excellent progress with use of the slide board to the [facility], lateral leans for LB self care.  He is continuing to progress with standing but has not met that STG yet.   Patient continues to demonstrate the following deficits: muscle weakness and muscle joint tightness, abnormal tone, unbalanced muscle activation, and decreased coordination, and decreased sitting balance, decreased standing balance, decreased postural control, hemiplegia, decreased balance strategies, and difficulty maintaining precautions and therefore will continue to benefit from skilled OT intervention to enhance overall performance with BADL.  Patient progressing toward long term goals..  Continue plan of care.  OT Short Term Goals Week 1:  OT Short Term Goal 1 (Week 1): Pt will independtly recall LLE PWB restrictions. OT Short Term Goal 1 - Progress (Week 1): Met OT Short Term Goal 2 (Week 1): Pt will don pants with max A + AE PRN. OT Short Term Goal 2 - Progress (Week 1): Met OT Short Term Goal 3 (Week 1): Pt will complete toilet transfer with max A and LRAD. OT Short Term Goal 3 - Progress (Week 1): Met OT Short Term Goal 4 (Week 1): Pt will complete standing grooming task with no more than min cues to adhere to LLE PWB precautions. OT Short Term Goal 4 - Progress (Week 1): Progressing toward goal Week 2:  OT Short Term Goal 1 (Week 2): STGs = LTGs  Skilled Therapeutic Interventions/Progress Updates:    Pt received in w/c and he wanted to wash up and try to toilet.  Adjusted BSC to level of w/c cushion. Pt's chair needed to be backed  into bathroom so pt could use board to move to his R onto commode.  CGA to transfer to toilet. From there he used lateral leans to doff pants with min over hips. This bathroom set up is very tight and he does not have as much room to lean side to side due to walls on each side of toilet.  Pt sat on commode and washed his body.  Unable to void. Donned pants with min A due to wound vac and A to get over hips because of limited space.  CGA on board back to w/c.   Spent time discussing home set up and strategies. In room, pt completed oral care independently. Pt set up with all needs met. Alarm on.   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 Required Braces or Orthoses: Other Brace Other Brace: L darco Restrictions Weight Bearing Restrictions: Yes LLE Weight Bearing: Partial weight bearing LLE Partial Weight Bearing Percentage or Pounds: wb on L heel with Darco Boot   Pain: Pain Assessment Pain Scale: 0-10 Pain Score: 4  Pain Type: Surgical pain Pain Location: Leg Pain Orientation: Left Pain Intervention(s): Medication (See eMAR);Repositioned;Emotional support;Relaxation  ADL: ADL Eating: Modified independent Where Assessed-Eating: Bed level, Wheelchair Grooming: Modified independent Where Assessed-Grooming: Edge of bed Upper Body Bathing: Supervision/safety Where Assessed-Upper Body Bathing: Edge of bed Lower Body Bathing: Minimal assistance Where Assessed-Lower Body Bathing: Edge of bed Upper Body Dressing: Supervision/safety Where Assessed-Upper Body Dressing: Edge of bed  Lower Body Dressing: Minimal assistance Where Assessed-Lower Body Dressing: Edge of bed Toileting: Minimal assistance Where Assessed-Toileting: Toilet, Recruitment consultant Transfer: Minimal Print production planner Method: Theatre manager: Grab bars, Extra wide drop arm bedside  commode Tub/Shower Transfer: Not assessed Social research officer, government: Not assessed   Therapy/Group: Individual Therapy  [place] 03/01/2021, 12:25 PM

## 2021-03-01 NOTE — Progress Notes (Signed)
PROGRESS NOTE   Subjective/Complaints:  Pain well controlled. Some oozing from amp site related to darco boot. Otherwise doing well  ROS: Patient denies fever, rash, sore throat, blurred vision, nausea, vomiting, diarrhea, cough, shortness of breath or chest pain,  headache, or mood change.    Objective:   No results found. No results for input(s): WBC, HGB, HCT, PLT in the last 72 hours.  Recent Labs    03/01/21 0504  NA 135  K 4.2  CL 105  CO2 25  GLUCOSE 107*  BUN 24*  CREATININE 1.09  CALCIUM 8.9    Intake/Output Summary (Last 24 hours) at 03/01/2021 5638 Last data filed at 03/01/2021 0900 Gross per 24 hour  Intake 960 ml  Output 2000 ml  Net -1040 ml        Physical Exam: Vital Signs Blood pressure 94/62, pulse 84, temperature 98.4 F (36.9 C), resp. rate 18, height 5\' 7"  (1.702 m), weight 104.3 kg, SpO2 100 %.    Constitutional: No distress . Vital signs reviewed. HEENT: NCAT, EOMI, oral membranes moist Neck: supple Cardiovascular: RRR without murmur. No JVD    Respiratory/Chest: CTA Bilaterally without wheezes or rales. Normal effort    GI/Abdomen: BS +, non-tender, non-distended Ext: no clubbing, cyanosis, tr to 1+ LLE edema Psych: pleasant and cooperative  MS:  left leg tender Ext: no clubbing, cyanosis  Psych: pleasant and cooperative  Extremities:LLE 1+ edema/splint Skin: No evidence of breakdown, no evidence of rash, right foot dry, left leg dressed/vac Neurologic: Cranial nerves II through XII intact, motor strength is 5/5 in Right , 3- Left deltoid, bicep, tricep, grip, hip flexor, knee extensors, L ankle dorsiflexor and plantar flexor not tested due to dressing wound vac     Assessment/Plan: 1. Functional deficits which require 3+ hours per day of interdisciplinary therapy in a comprehensive inpatient rehab setting. Physiatrist is providing close team supervision and 24 hour  management of active medical problems listed below. Physiatrist and rehab team continue to assess barriers to discharge/monitor patient progress toward functional and medical goals  Care Tool:  Bathing    Body parts bathed by patient: Right arm, Left arm, Chest, Abdomen, Front perineal area, Right upper leg, Left upper leg, Face, Buttocks   Body parts bathed by helper: Buttocks, Right lower leg Body parts n/a: Left lower leg   Bathing assist Assist Level: Minimal Assistance - Patient > 75%     Upper Body Dressing/Undressing Upper body dressing   What is the patient wearing?: Pull over shirt    Upper body assist Assist Level: Set up assist    Lower Body Dressing/Undressing Lower body dressing      What is the patient wearing?: Pants     Lower body assist Assist for lower body dressing: Minimal Assistance - Patient > 75%     Toileting Toileting    Toileting assist Assist for toileting: Independent with assistive device Assistive Device Comment: urinal   Transfers Chair/bed transfer  Transfers assist     Chair/bed transfer assist level: Contact Guard/Touching assist Chair/bed transfer assistive device: Sliding board   Locomotion Ambulation   Ambulation assist   Ambulation activity did not occur: Safety/medical concerns  Walk 10 feet activity   Assist  Walk 10 feet activity did not occur: Safety/medical concerns        Walk 50 feet activity   Assist Walk 50 feet with 2 turns activity did not occur: Safety/medical concerns         Walk 150 feet activity   Assist Walk 150 feet activity did not occur: Safety/medical concerns         Walk 10 feet on uneven surface  activity   Assist Walk 10 feet on uneven surfaces activity did not occur: Safety/medical concerns         Wheelchair     Assist Is the patient using a wheelchair?: Yes Type of Wheelchair: Manual    Wheelchair assist level: Minimal Assistance - Patient >  75% Max wheelchair distance: 161ft    Wheelchair 50 feet with 2 turns activity    Assist        Assist Level: Supervision/Verbal cueing   Wheelchair 150 feet activity     Assist      Assist Level: Supervision/Verbal cueing   Blood pressure 94/62, pulse 84, temperature 98.4 F (36.9 C), resp. rate 18, height 5\' 7"  (1.702 m), weight 104.3 kg, SpO2 100 %.  Medical Problem List and Plan: 1. 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                -patient may shower if LLE is covered             -ELOS/Goals: 12/28 supervision to min assist goals with PT, OT  -Continue CIR therapies including PT, OT  2.  Antithrombotics: -DVT/anticoagulation:  Pharmaceutical: Lovenox             -antiplatelet therapy: Aspirin 81 mg daily and Plavix 25 mg daily 3. Pain Management: Oxycodone/Robaxin as needed  12/21- pain controlled- 4-5/10- con't regimen 4. Mood: Zoloft 150 mg daily, trazodone 100 mg nightly             -antipsychotic agents: Latuda 20 mg daily 5. Neuropsych: This patient is capable of making decisions on his own behalf. 6. Skin/Wound Care:  continue vac per surgery             -added eucerin cream for RLE 7. Fluids/Electrolytes/Nutrition: eating fairly well  12/19 -pre-renal azotemia--by labs   -push fluids and recheck later this week  12/22 BUN sl down to 24 but up from prior numbers -need to keep pushing fluids  8.  Acute blood loss anemia.  Follow-up CBC 9.  Diabetes mellitus.  Hemoglobin A1c 6.1.  Semglee 10 units nightly.  Check blood sugars before meals and at bedtime.  Diabetic teaching CBG (last 3)  Recent Labs    02/28/21 1648 02/28/21 2049 03/01/21 0610  GLUCAP 119* 160* 108*  12/22- CBGs controlled- con't regimen 10.  Hypertension.  Norvasc 10 mg daily.  Monitor with increased mobility Vitals:   02/28/21 2037  03/01/21 0511  BP: 125/85 94/62  Pulse: 81 84  Resp: 19 18  Temp: 98.8 F (37.1 C) 98.4 F (36.9 C)  SpO2: 100% 100%   12/22 controlled. Avoid hypotension  11.  Hyperlipidemia.  Lipitor 12.  History of CVA with residual left-sided weakness.  Continue aspirin and Plavix 13.  Morbid obesity.  BMI 37.74.  Dietary follow-up 14.  Constipation.  MiraLAX twice daily as needed.              -  had bm 12/121  -continue regimen  15. Wound VAC  12/22 changing M/W/F---dc vac per surgery on 12/26      LOS: 6 days A FACE TO FACE EVALUATION WAS PERFORMED  Ranelle Oyster 03/01/2021, 9:23 AM

## 2021-03-01 NOTE — Progress Notes (Signed)
Physical Therapy Session Note  Patient Details  Name: Adam Perez MRN: [id number] Date of Birth: September 21, 1955  Today's Date: 03/01/2021 PT Individual Time: 4097-3532 PT Individual Time Calculation (min): 72 min   Short Term Goals: Week 1:  PT Short Term Goal 1 (Week 1): Pt will trasnfer to and from bed with min assist consistently with SB PT Short Term Goal 2 (Week 1): Pt will perform WC mobiltiy with supervision assist PT Short Term Goal 3 (Week 1): Pt will ambulate 15ft with max assist and LRAD  Skilled Therapeutic Interventions/Progress Updates:  Pt received seated in WC in room, hips swept to R side of chair and significant L trunk lean. Pt reported 3/10 pain in L foot and perseverated on "bleeding out" of incision throughout session, despite heavy education regarding drainage being a normal part of healing from multiple professionals. Pt was premedicated and offered distraction throughout session for pain relief. Emphasis of session on positioning in WC to promote proper posture and improve efficiency of self-propulsion. Pt performed lateral board transfer to R side from [facility] to bed w/CGA, pt able to set up transfer w/set-up assist only. Measured pt at widest part of hips (19.5") and from popliteal fossa to most posterior point of buttocks (17"). Pt currently sitting in 20x18 WC, obtained an 18x18 to trial to better accommodate hip width and promote improved pelvic alignment in WC. Pt performed lateral board transfer from bed to 18x18 WC at hemi height on R side w/CGA. Provided max multimodal cues for pt to scoot hips back into chair and to L side, as he sits heavy on R side. Pt unable to correct positioning despite cues and tactile facilitation, frequently interrupting therapist while attempting to educate pt on purpose of proper positioning. Added curved back rest to facilitate midline orientation and reduce L trunk lean. Pt continuously interrupting therapist while attempting to educate him on  positioning to inquire about ordering equipment, despite educating pt on process multiple times. Pt able to self-propel >200' in 18x18 chair at hemi-height with significantly improved efficiency and speed and reported satisfaction with size of chair. Inquired w/OT regarding pelvic positioning in chair as pt continues to demonstrate pelvis windswept to R and L trunk lean. Pt performed lateral board transfer from [facility] to EOB on R side w/CGA and added wedge under L side of WC cushion to bias trunk lean to R and facilitate midline orientation. Pt performed final board transfer to R side from bed to [facility] w/CGA. Noted improved trunk alignment w/wedge under L side, will continue to trial wedge types to facilitate good posture in chair for reduced pressure injury and low back pain risk. Pt was left seated in WC in room, all needs in reach.   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 Required Braces or Orthoses: Other Brace Other Brace: L darco Restrictions Weight Bearing Restrictions: Yes LLE Weight Bearing: Partial weight bearing LLE Partial Weight Bearing Percentage or Pounds: wb on L heel with Darco Boot    Therapy/Group: Individual Therapy Jill Alexanders Xiomara Sevillano, PT, DPT  03/01/2021, 7:49 AM

## 2021-03-01 NOTE — Progress Notes (Signed)
Nutrition Follow-up  DOCUMENTATION CODES:   Not applicable  INTERVENTION:   - Continue ProSource Plus 30 ml po BID, each supplement provides 100 kcal and 15 grams of protein  - Continue 1 packet Juven BID, each packet provides 95 calories, 2.5 grams of protein, and 9.8 grams of carbohydrate; also contains L-arginine and L-glutamine, vitamin C, vitamin E, vitamin B-12, zinc, calcium, and calcium Beta-hydroxy-Beta-methylbutyrate to support wound healing  - Continue MVI with minerals daily  NUTRITION DIAGNOSIS:   Increased nutrient needs related to wound healing as evidenced by estimated needs.  Ongoing, being addressed via oral nutrition supplements  GOAL:   Patient will meet greater than or equal to 90% of their needs  Progressing  MONITOR:   PO intake, Supplement acceptance, Labs, Skin  REASON FOR ASSESSMENT:   Malnutrition Screening Tool    ASSESSMENT:   65 yo male admitted to [facility] with functional deficits secondary to recent L TMA on 02/19/2021. PMH includes CVA with residual left-sided weakness, DM, HTN  Spoke with pt at bedside. He reports appetite is fair and that he is eating well. RD provided menu orientation and shared with pt the items that are always available if he does not like the specials of the day. Pt states that he is taking the Juven and ProSource without issue. Pt states that he has lost some weight but that he has been trying to lose weight. Discussed with pt the importance of adequate protein intake in promoting wound healing. Pt expresses understanding.  No new weights since 12/16.  Meal Completion: 100% x last 8 documented meals  Medications reviewed and include: ProSource Plus BID, SSI, semglee 10 units daily, MVI with minerals daily, Juven BID, senna  Labs reviewed: BUN 24, hemoglobin 9.3 CBG's: 108-160 x 24 hours  UOP: 1700 ml x 24 hours I/O's: -7.5 L since admit  NUTRITION - FOCUSED PHYSICAL EXAM:  Flowsheet Row Most  Recent Value  Orbital Region No depletion  Upper Arm Region No depletion  Thoracic and Lumbar Region No depletion  Buccal Region No depletion  Temple Region No depletion  Clavicle Bone Region No depletion  Clavicle and Acromion Bone Region No depletion  Scapular Bone Region No depletion  Dorsal Hand No depletion  Patellar Region No depletion  Anterior Thigh Region No depletion  Posterior Calf Region No depletion  Edema (RD Assessment) None  Hair Reviewed  Eyes Reviewed  Mouth Reviewed  Skin Reviewed  Nails Reviewed       Diet Order:   Diet Order             Diet Carb Modified Fluid consistency: Thin; Room service appropriate? Yes  Diet effective now                   EDUCATION NEEDS:   Education needs have been addressed  Skin:  Skin Integrity Issues: Wound VAC: LLE s/p TMA on 12/12  Last BM:  02/28/21  Height:   Ht Readings from Last 1 Encounters:  02/23/21 5\' 7"  (1.702 m)    Weight:   Wt Readings from Last 1 Encounters:  02/23/21 104.3 kg    BMI:  Body mass index is 36.01 kg/m.  Estimated Nutritional Needs:   Kcal:  2100-2300 kcals  Protein:  110-130 grams  Fluid:  >/= 2L    02/25/21, MS, RD, LDN Inpatient Clinical Dietitian Please see [facility] for contact information.

## 2021-03-01 NOTE — Progress Notes (Signed)
Occupational Therapy Session Note  Patient Details  Name: Adam Perez MRN: [id number] Date of Birth: 12-Oct-1955  Today's Date: 03/01/2021 OT Group Time: 5102-5852 OT Group Time Calculation (min): 60 min   Short Term Goals: Week 2:  OT Short Term Goal 1 (Week 2): STGs = LTGs  Skilled Therapeutic Interventions/Progress Updates:  Pt participated in group session with a focus on stress mgmt, education on healthy coping strategies, and social interaction. Focus of session on providing coping strategies to manage new current level of function as a result of new diagnosis.  Session focus on breaking down stressors into daily hassles, major life stressors and life circumstances in an effort to allow pts to chunk their stressors into groups. Pt actively sharing stressors and contributing to group conversation very appreciative of session and repeated often how glad he was that his mental health was being discussed. Provided active listening, emotional support and therapeutic use of self. Offered education on factors that protect [place] against stress such as daily uplifts, healthy coping strategies and protective factors. Encouraged all group members to make an effort to actively recall one event from their day that was a daily uplift in an effort to protect their mindset from stressors as well as sharing this information with their caregivers to facilitate improved caregiver communication and decrease overall burden of care.  Issued pt handouts on healthy coping strategies to implement into routine. Pt transported back to room by RT.  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 Required Braces or Orthoses: Other Brace Other Brace: L darco Restrictions Weight Bearing Restrictions: Yes LLE Weight Bearing: Partial weight bearing LLE Partial Weight Bearing Percentage or Pounds: wb on L heel with Darco  Boot  Pain: no pain reported during session     Therapy/Group: Group Therapy  Barron Schmid 03/01/2021, 4:08 PM

## 2021-03-02 LAB — GLUCOSE, CAPILLARY
Glucose-Capillary: 101 mg/dL — ABNORMAL HIGH (ref 70–99)
Glucose-Capillary: 138 mg/dL — ABNORMAL HIGH (ref 70–99)
Glucose-Capillary: 115 mg/dL — ABNORMAL HIGH (ref 70–99)
Glucose-Capillary: 120 mg/dL — ABNORMAL HIGH (ref 70–99)

## 2021-03-02 NOTE — Progress Notes (Signed)
Physical Therapy Session Note  Patient Details  Name: Adam Perez MRN: [id number] Date of Birth: March 04, 1956  Today's Date: 03/02/2021 PT Individual Time: 9735-3299; 2426-8341 PT Individual Time Calculation (min): 44 min and 55 min  Short Term Goals: Week 1:  PT Short Term Goal 1 (Week 1): Pt will trasnfer to and from bed with min assist consistently with SB PT Short Term Goal 2 (Week 1): Pt will perform WC mobiltiy with supervision assist PT Short Term Goal 3 (Week 1): Pt will ambulate 43ft with max assist and LRAD  Skilled Therapeutic Interventions/Progress Updates:  Session 1 Pt received seated EOB w/ wound care nurse. Pt reported 4/10 pain in L foot 2/2 wound being freshly wrapped. Offered distraction, snacks and change of scenery for pain modulation. Pt performed lateral board transfer from EOB to WC to R side w/min A for set up. Pt transported to day room to obtain word puzzle and snack from [place] for improved mood and transported to ortho gym w/total A for time management. Pt performed 10 minutes on SciFit level 2, 5 minutes fwd and 5 retro, for improved BUE/L grip strength and shoulder ROM.   The following exercises were performed while seated in WC for improved BUE strength:  -Bicep curls w/4# dowel, 2x10  -Overhead presses w/4# dowel, 2x7   Pt transported back to room w/total A for time management. Pt was left seated in WC in room, all needs in reach. Nursing notified of odor emitting from wound vac that is disturbing other pts in the gym.   Session 2 Pt received seated in WC in room, reported 4/10 pain in L ankle and declined pain meds. Noted wound vac had been changed. Offered repositioning and distraction throughout session for pain management. Emphasis of session on sit <>stand transfers, BLE strength and weightbearing tolerance of LLE. Pt self-propelled >100' from room to main gym w/S* using hemi-technique of R side.   In // bars for improved LLE weightbearing and transfers:   -Blocked sit <>stand practice w/BUE support on rails and CGA throughout. Pt demonstrated significantly improved hand placement on stand and sit, reported "Amil Amen told me every thing I did wrong earlier and I do not want you to tell on me for doing it wrong". Pt initially reached for rail to pull up midway through sit <>stand but progressed to being able to stand without pulling up w/min verbal cues to shift anteriorly and use momentum. Pt able to hold each stand for 2-3 minutes w/LUE support, noted L trunk lean and R pelvic rotation in standing, very similar to pt's sitting position. Upon standing, LLE began to exhibit clonus that continued throughout entirety of session. Stand <>sits w/CGA and pt demonstrates difficulty reaching back to chair to sit and rotates to R side to use BUEs to sit in chair w/poor eccentric control.   Pt self-propelled >100' back to room using hemi-technique w/S* and pt was left seated in WC in room, all needs in reach.   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 Required Braces or Orthoses: Other Brace Other Brace: L darco Restrictions Weight Bearing Restrictions: Yes LLE Weight Bearing: Partial weight bearing LLE Partial Weight Bearing Percentage or Pounds: wb on L heel with Darco Boot   Therapy/Group: Individual Therapy Adam Perez, PT, DPT  03/02/2021, 7:51 AM

## 2021-03-02 NOTE — Progress Notes (Signed)
Physical Therapy Session Note  Patient Details  Name: Adam Perez MRN: [id number] Date of Birth: 17-Mar-1955  Today's Date: 03/02/2021 PT Individual Time: 9798-9211 PT Individual Time Calculation (min): 31 min   Short Term Goals: Week 1:  PT Short Term Goal 1 (Week 1): Pt will trasnfer to and from bed with min assist consistently with SB PT Short Term Goal 2 (Week 1): Pt will perform WC mobiltiy with supervision assist PT Short Term Goal 3 (Week 1): Pt will ambulate 35ft with max assist and LRAD Week 2:     Skilled Therapeutic Interventions/Progress Updates:  Patient seated upright in w/c on entrance to room. Patient alert and agreeable to PT session.   Patient with min pain complaint throughout session at surgical site. No numerical rating provided. Apprehensive re: upcoming treatment and dressing change from wound care nurse.  Therapeutic Activity: Transfers: Pt requesting to use toilet on entrance to room. Relates transferring via use of slide board. Pt backed into bathroom via w/c, then setup for toilet transfer using slide board. Requires Min A for placement, then initiates slide with no vc for technique. On reaching platform of bari BSC over toilet and without warning, pt reaches for RUE support to pull up to stand in order to doff shorts. Completes remaining pivot to toilet all NWB to LLE. Attempt for squat pivot to L side back to w/c with pt performing more lateral scoot transfer with MinA and light minA to maintain NWB to non-shoed L foot.  Wheelchair Mobility:  Guided pt with Min A and vc for manuevering w/c around end of bed and backing into spot next to bed using hemitechnique. Attempt to get pt back into bed to prepare for upcoming appointment with wound care in , however pt wishes to remain seated OOB until appt.   RN requested for pain medication prior to wound care team visit.   Patient seated  in w/c at end of session with brakes locked, seat pad alarm set, and all  needs within reach.   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 Required Braces or Orthoses: Other Brace Other Brace: L darco Restrictions Weight Bearing Restrictions: Yes LLE Weight Bearing: Partial weight bearing LLE Partial Weight Bearing Percentage or Pounds: wb on L heel with Darco Boot General:   Pain:  Min pain complaint throughout session at surgical site  Therapy/Group: Individual Therapy  Loel Dubonnet 03/02/2021, 7:45 AM

## 2021-03-02 NOTE — Progress Notes (Signed)
Occupational Therapy Session Note ° °Patient Details  °Name: Adam Perez °MRN: [id number] °Date of Birth: 10/09/1955 ° °Today's Date: 03/02/2021 °OT Individual Time: 1045-1130 °OT Individual Time Calculation (min): 45 min  ° ° °Short Term Goals: °Week 2:  OT Short Term Goal 1 (Week 2): STGs = LTGs ° °Skilled Therapeutic Interventions/Progress Updates:  °  Pt received in wc and agreeable to an earlier session than was on his schedule. Pt stated he did well with use of the slide board to the BSC earlier today.  ° °He has been doing well with board and lateral leans, so focus on his sit to stand skills. Worked on progressions and technique of pushing up with his R hand from arm rest, achieving a large wt shift and forward lean to transition to semi stand and then transferring his hand to elevated bed rail.  He did this all with therapist ONLY holding gait belt.  °He can then stand holding rail with S only. He is able to release his hand and stand with CGA to min if he wanted to use his hand for clothing management. This is all while maintaining PWB on L foot with DARCO shoe on. °To sit he reaches back with cues to look forward and lean forward so he is leading with his hips.  Pt did sit to stand 8x this session all with no actually physical assist.  He does very well with demonstrations and then repeat demonstrations.  Pt able to tolerate standing for close to 1 minute for each stand.   °Pt stated he felt really good being up on his R leg and liked the feeling of standing.  °Pt resting in wc with all needs met and alarm on.  ° °Therapy Documentation °Precautions:  °Precautions °Precautions: Fall °Precaution Comments: L sided hemi/spasticity from prior CVA °Required Braces or Orthoses: Other Brace °Other Brace: L darco °Restrictions °Weight Bearing Restrictions: Yes °LLE Weight Bearing: Partial weight bearing °LLE Partial Weight Bearing Percentage or Pounds: with darco boot ° °  °Vital Signs: °Therapy Vitals °BP:  123/82 °Pain: °Pain Assessment °Pain Scale: 0-10 °Pain Score: 5  °Pain Type: Surgical pain °Pain Location: Foot °Pain Orientation: Left °Pain Descriptors / Indicators: Aching °Pain Onset: On-going °Pain Intervention(s):  (premedicated) °ADL: °ADL °Eating: Modified independent °Where Assessed-Eating: Bed level, Wheelchair °Grooming: Modified independent °Where Assessed-Grooming: Edge of bed °Upper Body Bathing: Supervision/safety °Where Assessed-Upper Body Bathing: Edge of bed °Lower Body Bathing: Minimal assistance °Where Assessed-Lower Body Bathing: Edge of bed °Upper Body Dressing: Supervision/safety °Where Assessed-Upper Body Dressing: Edge of bed °Lower Body Dressing: Minimal assistance °Where Assessed-Lower Body Dressing: Edge of bed °Toileting: Minimal assistance °Where Assessed-Toileting: Toilet, Bedside Commode °Toilet Transfer: Minimal assistance °Toilet Transfer Method: Transfer board °Toilet Transfer Equipment: Grab bars, Extra wide drop arm bedside commode °Tub/Shower Transfer: Not assessed °Walk-In Shower Transfer: Not assessed °  ° ° ° °Therapy/Group: Individual Therapy ° °, °03/02/2021, 11:51 AM °

## 2021-03-02 NOTE — Progress Notes (Signed)
PROGRESS NOTE   Subjective/Complaints:  Overall feeling well. Left leg and foot still ache. Doesn't look forward to vac change today.   ROS: Patient denies fever, rash, sore throat, blurred vision, nausea, vomiting, diarrhea, cough, shortness of breath or chest pain, joint or back pain, headache, or mood change.    Objective:   No results found. No results for input(s): WBC, HGB, HCT, PLT in the last 72 hours.  Recent Labs    03/01/21 0504  NA 135  K 4.2  CL 105  CO2 25  GLUCOSE 107*  BUN 24*  CREATININE 1.09  CALCIUM 8.9    Intake/Output Summary (Last 24 hours) at 03/02/2021 1136 Last data filed at 03/02/2021 0700 Gross per 24 hour  Intake 717 ml  Output 1350 ml  Net -633 ml        Physical Exam: Vital Signs Blood pressure 123/82, pulse 86, temperature 98.7 F (37.1 C), resp. rate 18, height 5\' 7"  (1.702 m), weight 104.3 kg, SpO2 98 %.    Constitutional: No distress . Vital signs reviewed. HEENT: NCAT, EOMI, oral membranes moist Neck: supple Cardiovascular: RRR without murmur. No JVD    Respiratory/Chest: CTA Bilaterally without wheezes or rales. Normal effort    GI/Abdomen: BS +, non-tender, non-distended Ext: no clubbing, cyanosis, 1+ LLE edema Psych: pleasant and cooperative  MS:  left leg tender Skin: No evidence of breakdown, no evidence of rash, right foot dry, left leg dressed/vac. Odor is present from vac Neurologic: Cranial nerves II through XII intact, motor strength is 5/5 in Right , 3- Left deltoid, bicep, tricep, grip, hip flexor, knee extensors, L ankle dorsiflexor and plantar flexor not tested due to dressing wound vac     Assessment/Plan: 1. Functional deficits which require 3+ hours per day of interdisciplinary therapy in a comprehensive inpatient rehab setting. Physiatrist is providing close team supervision and 24 hour management of active medical problems listed  below. Physiatrist and rehab team continue to assess barriers to discharge/monitor patient progress toward functional and medical goals  Care Tool:  Bathing    Body parts bathed by patient: Right arm, Left arm, Chest, Abdomen, Front perineal area, Right upper leg, Left upper leg, Face, Buttocks, Right lower leg   Body parts bathed by helper: Buttocks, Right lower leg Body parts n/a: Left lower leg   Bathing assist Assist Level: Contact Guard/Touching assist     Upper Body Dressing/Undressing Upper body dressing   What is the patient wearing?: Pull over shirt    Upper body assist Assist Level: Set up assist    Lower Body Dressing/Undressing Lower body dressing      What is the patient wearing?: Pants     Lower body assist Assist for lower body dressing: Minimal Assistance - Patient > 75%     Toileting Toileting    Toileting assist Assist for toileting: Minimal Assistance - Patient > 75% Assistive Device Comment: on BSC, A with clothing management   Transfers Chair/bed transfer  Transfers assist     Chair/bed transfer assist level: Contact Guard/Touching assist Chair/bed transfer assistive device: Sliding board (To R side)   Locomotion Ambulation   Ambulation assist   Ambulation activity  did not occur: Safety/medical concerns          Walk 10 feet activity   Assist  Walk 10 feet activity did not occur: Safety/medical concerns        Walk 50 feet activity   Assist Walk 50 feet with 2 turns activity did not occur: Safety/medical concerns         Walk 150 feet activity   Assist Walk 150 feet activity did not occur: Safety/medical concerns         Walk 10 feet on uneven surface  activity   Assist Walk 10 feet on uneven surfaces activity did not occur: Safety/medical concerns         Wheelchair     Assist Is the patient using a wheelchair?: Yes Type of Wheelchair: Manual    Wheelchair assist level: Supervision/Verbal  cueing Max wheelchair distance: 163ft    Wheelchair 50 feet with 2 turns activity    Assist        Assist Level: Supervision/Verbal cueing   Wheelchair 150 feet activity     Assist      Assist Level: Supervision/Verbal cueing   Blood pressure 123/82, pulse 86, temperature 98.7 F (37.1 C), resp. rate 18, height 5\' 7"  (1.702 m), weight 104.3 kg, SpO2 98 %.  Medical Problem List and Plan: 1. 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                -patient may shower if LLE is covered             -ELOS/Goals: 12/28 supervision to min assist goals with PT, OT  -Continue CIR therapies including PT, OT  2.  Antithrombotics: -DVT/anticoagulation:  Pharmaceutical: Lovenox             -antiplatelet therapy: Aspirin 81 mg daily and Plavix 25 mg daily 3. Pain Management: Oxycodone/Robaxin as needed  12/21- pain controlled- 4-5/10- con't regimen 4. Mood: Zoloft 150 mg daily, trazodone 100 mg nightly             -antipsychotic agents: Latuda 20 mg daily 5. Neuropsych: This patient is capable of making decisions on his own behalf. 6. Skin/Wound Care:  continue vac per surgery             -added eucerin cream for RLE 7. Fluids/Electrolytes/Nutrition: eating fairly well  12/22 BUN sl down to 24 but up from prior numbers -need to keep pushing fluids 12/23 recheck labs monday  8.  Acute blood loss anemia.  Follow-up CBC 9.  Diabetes mellitus.  Hemoglobin A1c 6.1.  Semglee 10 units nightly.  Check blood sugars before meals and at bedtime.  Diabetic teaching CBG (last 3)  Recent Labs    03/01/21 1651 03/01/21 2107 03/02/21 0613  GLUCAP 93 183* 101*  12/23- CBGs reasonably controlled with some variance 10.  Hypertension.  Norvasc 10 mg daily.  Monitor with increased mobility Vitals:   03/02/21 0457 03/02/21 0823  BP: 99/64 123/82   Pulse: 86   Resp: 18   Temp: 98.7 F (37.1 C)   SpO2: 98%    12/23 controlled. Avoid hypotension  11.  Hyperlipidemia.  Lipitor 12.  History of CVA with residual left-sided weakness.  Continue aspirin and Plavix 13.  Morbid obesity.  BMI 37.74.  Dietary follow-up 14.  Constipation.  MiraLAX twice daily as needed.              -  had bm 12/21  -continue regimen  15. Wound VAC  12/22 changing M/W/F---dc vac per surgery on 12/26    -appreciate WOC RN help  LOS: 7 days A FACE TO FACE EVALUATION WAS PERFORMED  Ranelle Oyster 03/02/2021, 11:36 AM

## 2021-03-02 NOTE — Plan of Care (Signed)
°  Problem: RH Ambulation Goal: LTG Patient will ambulate in controlled environment (PT) Description: LTG: Patient will ambulate in a controlled environment, # of feet with assistance (PT). Outcome: Not Applicable Note: Pt will DC at [facility] level due to safety concerns and pain with ambulation

## 2021-03-02 NOTE — Consult Note (Signed)
[facility] Nurse wound follow up Wound type: Surgical Measurement: see notes from 02/28/21 Wound bed: Beefy red with myriad skin substitute present/80% coverage Drainage (amount, consistency, odor) serosanguinous  Periwound: intact Dressing procedure/placement/frequency: Ace wrap and kerlix removed with one piece of black foam dressing. Odor noted, but wound appears clean. Suggested to change VAC canister soon.   2 pieces of adaptic placed over the wound, followed by hydrogel and one piece of black foam. Drape applied and immediate suction obtained at 125 mmHg.  1 piece of adaptic placed over the staples at the amputation site. Topped with dry 4x4 gauze pads. Used 4x4 gauze pads as bolster under tubing to avoid pressure from the tubing on the leg. Wrapped the LE from the tip to the knee down to the amputation site with Kerlix and Ace wrap. Patient received PO pain meds prior to dressing change. Tolerated well.   WOC will follow MWF, blocked 9am time for NPWT dressing change   [place], [place], [place]

## 2021-03-02 NOTE — Plan of Care (Signed)
°  Problem: Consults Goal: RH LIMB LOSS PATIENT EDUCATION Description: Description: See Patient Education module for eduction specifics. Outcome: Progressing Goal: Skin Care Protocol Initiated - if Braden Score 18 or less Description: If consults are not indicated, leave blank or document N/A Outcome: Progressing Goal: Diabetes Guidelines if Diabetic/Glucose > 140 Description: If diabetic or lab glucose is > 140 mg/dl - Initiate Diabetes/Hyperglycemia Guidelines & Document Interventions  Outcome: Progressing   Problem: RH BOWEL ELIMINATION Goal: RH STG MANAGE BOWEL WITH ASSISTANCE Description: STG Manage Bowel with Min Assistance. Outcome: Progressing Goal: RH STG MANAGE BOWEL W/MEDICATION W/ASSISTANCE Description: STG Manage Bowel with Medication with Min Assistance. Outcome: Progressing   Problem: RH SKIN INTEGRITY Goal: RH STG MAINTAIN SKIN INTEGRITY WITH ASSISTANCE Description: STG Maintain Skin Integrity With Min Assistance. Outcome: Progressing Goal: RH STG ABLE TO PERFORM INCISION/WOUND CARE W/ASSISTANCE Description: STG Able To Perform Incision/Wound Care With [employer]. Outcome: Progressing   Problem: RH SAFETY Goal: RH STG ADHERE TO SAFETY PRECAUTIONS W/ASSISTANCE/DEVICE Description: STG Adhere to Safety Precautions With Cues and Reminders. Outcome: Progressing Goal: RH STG DECREASED RISK OF FALL WITH ASSISTANCE Description: STG Decreased Risk of Fall With [employer]. Outcome: Progressing   Problem: RH PAIN MANAGEMENT Goal: RH STG PAIN MANAGED AT OR BELOW PT'S PAIN GOAL Description: < 3 on a 0-10 pain scale. Outcome: Progressing   Problem: RH KNOWLEDGE DEFICIT LIMB LOSS Goal: RH STG INCREASE KNOWLEDGE OF SELF CARE AFTER LIMB LOSS Description: Patient will demonstrate knowledge of medication/pain management, skin/wound care, weight bearing precautions, and safety awareness with educational materials and handouts provided by staff independently at  discharge. Outcome: Progressing

## 2021-03-03 LAB — GLUCOSE, CAPILLARY
Glucose-Capillary: 109 mg/dL — ABNORMAL HIGH (ref 70–99)
Glucose-Capillary: 125 mg/dL — ABNORMAL HIGH (ref 70–99)
Glucose-Capillary: 159 mg/dL — ABNORMAL HIGH (ref 70–99)
Glucose-Capillary: 117 mg/dL — ABNORMAL HIGH (ref 70–99)

## 2021-03-03 MED ORDER — SORBITOL 70 % SOLN
60.0000 mL | Status: AC
  Administered 2021-03-03: 13:00:00 60 mL via ORAL
  Filled 2021-03-03: qty 60

## 2021-03-03 NOTE — Progress Notes (Signed)
PROGRESS NOTE   Subjective/Complaints:  No new issues this morning. Happy with progress. Left leg/foot pain at times. Tolerates vac changes ok  ROS: Patient denies fever, rash, sore throat, blurred vision, nausea, vomiting, diarrhea, cough, shortness of breath or chest pain, headache, or mood change.    Objective:   No results found. No results for input(s): WBC, HGB, HCT, PLT in the last 72 hours.  Recent Labs    03/01/21 0504  NA 135  K 4.2  CL 105  CO2 25  GLUCOSE 107*  BUN 24*  CREATININE 1.09  CALCIUM 8.9    Intake/Output Summary (Last 24 hours) at 03/03/2021 0919 Last data filed at 03/03/2021 0525 Gross per 24 hour  Intake 240 ml  Output 1330 ml  Net -1090 ml        Physical Exam: Vital Signs Blood pressure 126/85, pulse 93, temperature 97.9 F (36.6 C), temperature source Oral, resp. rate 20, height 5\' 7"  (1.702 m), weight 104.3 kg, SpO2 100 %.    Constitutional: No distress . Vital signs reviewed. HEENT: NCAT, EOMI, oral membranes moist Neck: supple Cardiovascular: RRR without murmur. No JVD    Respiratory/Chest: CTA Bilaterally without wheezes or rales. Normal effort    GI/Abdomen: BS +, non-tender, non-distended Ext: no clubbing, cyanosis, or edema Psych: pleasant and cooperative  MS:  left leg tender with palpation Skin: No evidence of breakdown, no evidence of rash, right foot dry, left leg dressed/vac. Vac cannister almost empty Neurologic: Cranial nerves II through XII intact, motor strength is 5/5 in Right , 3- Left deltoid, bicep, tricep, grip, hip flexor, knee extensors, L ankle dorsiflexor and plantar flexor not tested due to dressing wound vac     Assessment/Plan: 1. Functional deficits which require 3+ hours per day of interdisciplinary therapy in a comprehensive inpatient rehab setting. Physiatrist is providing close team supervision and 24 hour management of active medical  problems listed below. Physiatrist and rehab team continue to assess barriers to discharge/monitor patient progress toward functional and medical goals  Care Tool:  Bathing    Body parts bathed by patient: Right arm, Left arm, Chest, Abdomen, Front perineal area, Right upper leg, Left upper leg, Face, Buttocks, Right lower leg   Body parts bathed by helper: Buttocks, Right lower leg Body parts n/a: Left lower leg   Bathing assist Assist Level: Contact Guard/Touching assist     Upper Body Dressing/Undressing Upper body dressing   What is the patient wearing?: Pull over shirt    Upper body assist Assist Level: Set up assist    Lower Body Dressing/Undressing Lower body dressing      What is the patient wearing?: Pants     Lower body assist Assist for lower body dressing: Minimal Assistance - Patient > 75%     Toileting Toileting    Toileting assist Assist for toileting: Minimal Assistance - Patient > 75% Assistive Device Comment: on BSC, A with clothing management   Transfers Chair/bed transfer  Transfers assist     Chair/bed transfer assist level: Contact Guard/Touching assist Chair/bed transfer assistive device: Sliding board (To R side)   Locomotion Ambulation   Ambulation assist   Ambulation activity did  not occur: Safety/medical concerns          Walk 10 feet activity   Assist  Walk 10 feet activity did not occur: Safety/medical concerns        Walk 50 feet activity   Assist Walk 50 feet with 2 turns activity did not occur: Safety/medical concerns         Walk 150 feet activity   Assist Walk 150 feet activity did not occur: Safety/medical concerns         Walk 10 feet on uneven surface  activity   Assist Walk 10 feet on uneven surfaces activity did not occur: Safety/medical concerns         Wheelchair     Assist Is the patient using a wheelchair?: Yes Type of Wheelchair: Manual    Wheelchair assist level:  Supervision/Verbal cueing Max wheelchair distance: 180ft    Wheelchair 50 feet with 2 turns activity    Assist        Assist Level: Supervision/Verbal cueing   Wheelchair 150 feet activity     Assist      Assist Level: Supervision/Verbal cueing   Blood pressure 126/85, pulse 93, temperature 97.9 F (36.6 C), temperature source Oral, resp. rate 20, height 5\' 7"  (1.702 m), weight 104.3 kg, SpO2 100 %.  Medical Problem List and Plan: 1. 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                -patient may shower if LLE is covered             -ELOS/Goals: 12/28 supervision to min assist goals with PT, OT  -Continue CIR therapies including PT, OT, and SLP  2.  Antithrombotics: -DVT/anticoagulation:  Pharmaceutical: Lovenox             -antiplatelet therapy: Aspirin 81 mg daily and Plavix 25 mg daily 3. Pain Management: Oxycodone/Robaxin as needed  12/21- pain controlled- 4-5/10- con't regimen 4. Mood: Zoloft 150 mg daily, trazodone 100 mg nightly             -antipsychotic agents: Latuda 20 mg daily 5. Neuropsych: This patient is capable of making decisions on his own behalf. 6. Skin/Wound Care:  continue vac per surgery             -added eucerin cream for RLE  -vac change MWF  -vac to be d/c'ed 12/26  -discussed potential wound care needs at home with pt 7. Fluids/Electrolytes/Nutrition: eating fairly well  12/22 BUN sl down to 24 but up from prior numbers -need to keep pushing fluids 12/23 recheck labs monday  8.  Acute blood loss anemia.  Follow-up CBC 9.  Diabetes mellitus.  Hemoglobin A1c 6.1.  Semglee 10 units nightly.  Check blood sugars before meals and at bedtime.  Diabetic teaching CBG (last 3)  Recent Labs    03/02/21 1639 03/02/21 2055 03/03/21 0613  GLUCAP 115* 120* 109*  12/24- CBGs reasonably controlled  with some variance 10.  Hypertension.  Norvasc 10 mg daily.  Monitor with increased mobility Vitals:   03/02/21 1423 03/02/21 1926  BP: 127/81 126/85  Pulse: 88 93  Resp: 16 20  Temp: (!) 97.5 F (36.4 C) 97.9 F (36.6 C)  SpO2: 100% 100%   12/24 controlled. Avoid hypotension  11.  Hyperlipidemia.  Lipitor 12.  History of CVA with residual left-sided weakness.  Continue aspirin and  Plavix 13.  Morbid obesity.  BMI 37.74.  Dietary follow-up 14.  Constipation.  MiraLAX twice daily as needed.              12/24-had bm 12/21--needs another today--->sorbitol  -continue regimen     LOS: 8 days A FACE TO FACE EVALUATION WAS PERFORMED  Ranelle Oyster 03/03/2021, 9:19 AM

## 2021-03-03 NOTE — Progress Notes (Signed)
Occupational Therapy Session Note  Patient Details  Name: Adam Perez MRN: [id number] Date of Birth: February 05, 1956  Today's Date: 03/03/2021 OT Individual Time: 1025-1130 OT Individual Time Calculation (min): 65 min    Short Term Goals: Week 2:  OT Short Term Goal 1 (Week 2): STGs = LTGs  Skilled Therapeutic Interventions/Progress Updates:    Pt received in wc and agreeable to b/d.  He wanted to shave today but needed razors. Pt worked on oral care and UB bathing and dressing while I retrieved shaving supplies.   He completed shaving and then stood briefly at sink to lower pants, bathe bottom with CGA to stabilize balance at sink. He stood up with close S using the sink for support.  Then needed CGA to hold balance due to LLE spasticity.    Pt then self propelled wc to gym.  Used slide board to mat with supervision and cues using scoots on the board vs sliding which is what I want to encourage him to do for improved squat pivot transfers.  Worked on sit to stand by pushing off mat and then transferring hands to RW with only CGA using good technique with wt shifting. Pt tolerated standing with RW for 2 minutes at a time maintaining PWB on L foot but with constant LLE spasticity.   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 Showed and pt repeat demonstrated an exercises where he reaches R arm up and away from his body at and angle and then reaches to the R to force R trunk elongation and wt shift onto R hip.   Pt did well with this and then worked on returning trunk back to neutral (not his natural sitting position).  Pt repeated 10x,  I encouraged him to do this daily several times a day from his w/c.   Pt then used board to transfer back to wc but this time he slid by leaning to the direction he was moving in (versus weight shift forward and doing more scoots). I think he was tired at this point and just anxious to get back to his wc.   Pt  returned to his room with all needs met.   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 Required Braces or Orthoses: Other Brace Other Brace: L darco Restrictions Weight Bearing Restrictions: Yes LLE Weight Bearing: Partial weight bearing LLE Partial Weight Bearing Percentage or Pounds: with darco boot  Pain: Pain Assessment Pain Scale: 0-10 Pain Score: 0-No pain ADL: ADL Eating: Modified independent Where Assessed-Eating: Bed level, Wheelchair Grooming: Modified independent Where Assessed-Grooming: Edge of bed Upper Body Bathing: Supervision/safety Where Assessed-Upper Body Bathing: Edge of bed Lower Body Bathing: Minimal assistance Where Assessed-Lower Body Bathing: Edge of bed Upper Body Dressing: Supervision/safety Where Assessed-Upper Body Dressing: Edge of bed Lower Body Dressing: Minimal assistance Where Assessed-Lower Body Dressing: Edge of bed Toileting: Minimal assistance Where Assessed-Toileting: Toilet, Recruitment consultant Transfer: Minimal Print production planner Method: Theatre manager: Grab bars, Extra wide drop arm bedside commode Tub/Shower Transfer: Not assessed Social research officer, government: Not assessed       Therapy/Group: Individual Therapy  [place] 03/03/2021, 11:34 AM

## 2021-03-04 LAB — GLUCOSE, CAPILLARY
Glucose-Capillary: 113 mg/dL — ABNORMAL HIGH (ref 70–99)
Glucose-Capillary: 113 mg/dL — ABNORMAL HIGH (ref 70–99)
Glucose-Capillary: 113 mg/dL — ABNORMAL HIGH (ref 70–99)
Glucose-Capillary: 144 mg/dL — ABNORMAL HIGH (ref 70–99)

## 2021-03-04 MED ORDER — SORBITOL 70 % SOLN
60.0000 mL | Status: AC
  Filled 2021-03-04: qty 60

## 2021-03-04 NOTE — Progress Notes (Signed)
Nursing held second dose of sorbitol this morning due to patients multiple bowel movements yesterday and this morning.  Cletis [employer], LPN

## 2021-03-04 NOTE — Plan of Care (Signed)
°  Problem: Consults Goal: RH LIMB LOSS PATIENT EDUCATION Description: Description: See Patient Education module for eduction specifics. Outcome: Progressing Goal: Skin Care Protocol Initiated - if Braden Score 18 or less Description: If consults are not indicated, leave blank or document N/A Outcome: Progressing Goal: Diabetes Guidelines if Diabetic/Glucose > 140 Description: If diabetic or lab glucose is > 140 mg/dl - Initiate Diabetes/Hyperglycemia Guidelines & Document Interventions  Outcome: Progressing   Problem: RH BOWEL ELIMINATION Goal: RH STG MANAGE BOWEL WITH ASSISTANCE Description: STG Manage Bowel with Min Assistance. Outcome: Progressing Goal: RH STG MANAGE BOWEL W/MEDICATION W/ASSISTANCE Description: STG Manage Bowel with Medication with Min Assistance. Outcome: Progressing   Problem: RH SKIN INTEGRITY Goal: RH STG MAINTAIN SKIN INTEGRITY WITH ASSISTANCE Description: STG Maintain Skin Integrity With Min Assistance. Outcome: Progressing Goal: RH STG ABLE TO PERFORM INCISION/WOUND CARE W/ASSISTANCE Description: STG Able To Perform Incision/Wound Care With [employer]. Outcome: Progressing   Problem: RH SAFETY Goal: RH STG ADHERE TO SAFETY PRECAUTIONS W/ASSISTANCE/DEVICE Description: STG Adhere to Safety Precautions With Cues and Reminders. Outcome: Progressing Goal: RH STG DECREASED RISK OF FALL WITH ASSISTANCE Description: STG Decreased Risk of Fall With [employer]. Outcome: Progressing   Problem: RH PAIN MANAGEMENT Goal: RH STG PAIN MANAGED AT OR BELOW PT'S PAIN GOAL Description: < 3 on a 0-10 pain scale. Outcome: Progressing   Problem: RH KNOWLEDGE DEFICIT LIMB LOSS Goal: RH STG INCREASE KNOWLEDGE OF SELF CARE AFTER LIMB LOSS Description: Patient will demonstrate knowledge of medication/pain management, skin/wound care, weight bearing precautions, and safety awareness with educational materials and handouts provided by staff independently at  discharge. Outcome: Progressing

## 2021-03-04 NOTE — Progress Notes (Signed)
PROGRESS NOTE   Subjective/Complaints:  No bm with sorbitol. +flatus. LLE pain stable to improved  ROS: Patient denies fever, rash, sore throat, blurred vision, nausea, vomiting, diarrhea, cough, shortness of breath or chest pain, joint or back pain, headache, or mood change. .    Objective:   No results found. No results for input(s): WBC, HGB, HCT, PLT in the last 72 hours.  No results for input(s): NA, K, CL, CO2, GLUCOSE, BUN, CREATININE, CALCIUM in the last 72 hours.   Intake/Output Summary (Last 24 hours) at 03/04/2021 0729 Last data filed at 03/04/2021 0208 Gross per 24 hour  Intake 590 ml  Output 1700 ml  Net -1110 ml        Physical Exam: Vital Signs Blood pressure 108/77, pulse 92, temperature 98.4 F (36.9 C), temperature source Oral, resp. rate 18, height 5\' 7"  (1.702 m), weight 104.3 kg, SpO2 97 %.    Constitutional: No distress . Vital signs reviewed. HEENT: NCAT, EOMI, oral membranes moist Neck: supple Cardiovascular: RRR without murmur. No JVD    Respiratory/Chest: CTA Bilaterally without wheezes or rales. Normal effort    GI/Abdomen: BS +, non-tender, non-distended Ext: no clubbing, cyanosis, or edema Psych: pleasant and cooperative  MS:  left leg tender with palpation Skin: No evidence of breakdown, no evidence of rash, right foot dry, left leg dressed/vac. Vac cannister almost empty Neurologic: Cranial nerves II through XII intact, motor strength is 5/5 in Right , 3- Left deltoid, bicep, tricep, grip, hip flexor, knee extensors, L ankle dorsiflexor and plantar flexor not tested due to dressing wound vac     Assessment/Plan: 1. Functional deficits which require 3+ hours per day of interdisciplinary therapy in a comprehensive inpatient rehab setting. Physiatrist is providing close team supervision and 24 hour management of active medical problems listed below. Physiatrist and rehab team  continue to assess barriers to discharge/monitor patient progress toward functional and medical goals  Care Tool:  Bathing    Body parts bathed by patient: Right arm, Left arm, Chest, Abdomen, Front perineal area, Right upper leg, Left upper leg, Face, Buttocks, Right lower leg   Body parts bathed by helper: Buttocks, Right lower leg Body parts n/a: Left lower leg   Bathing assist Assist Level: Contact Guard/Touching assist     Upper Body Dressing/Undressing Upper body dressing   What is the patient wearing?: Pull over shirt    Upper body assist Assist Level: Set up assist    Lower Body Dressing/Undressing Lower body dressing      What is the patient wearing?: Pants     Lower body assist Assist for lower body dressing: Minimal Assistance - Patient > 75%     Toileting Toileting    Toileting assist Assist for toileting: Minimal Assistance - Patient > 75% Assistive Device Comment: on BSC, A with clothing management   Transfers Chair/bed transfer  Transfers assist     Chair/bed transfer assist level: Contact Guard/Touching assist Chair/bed transfer assistive device: Sliding board (To R side)   Locomotion Ambulation   Ambulation assist   Ambulation activity did not occur: Safety/medical concerns          Walk 10 feet activity  Assist  Walk 10 feet activity did not occur: Safety/medical concerns        Walk 50 feet activity   Assist Walk 50 feet with 2 turns activity did not occur: Safety/medical concerns         Walk 150 feet activity   Assist Walk 150 feet activity did not occur: Safety/medical concerns         Walk 10 feet on uneven surface  activity   Assist Walk 10 feet on uneven surfaces activity did not occur: Safety/medical concerns         Wheelchair     Assist Is the patient using a wheelchair?: Yes Type of Wheelchair: Manual    Wheelchair assist level: Supervision/Verbal cueing Max wheelchair distance: 129ft     Wheelchair 50 feet with 2 turns activity    Assist        Assist Level: Supervision/Verbal cueing   Wheelchair 150 feet activity     Assist      Assist Level: Supervision/Verbal cueing   Blood pressure 108/77, pulse 92, temperature 98.4 F (36.9 C), temperature source Oral, resp. rate 18, height 5\' 7"  (1.702 m), weight 104.3 kg, SpO2 97 %.  Medical Problem List and Plan: 1. 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                -patient may shower if LLE is covered             -ELOS/Goals: 12/28 supervision to min assist goals with PT, OT  -Continue CIR therapies including PT, OT   2.  Antithrombotics: -DVT/anticoagulation:  Pharmaceutical: Lovenox             -antiplatelet therapy: Aspirin 81 mg daily and Plavix 25 mg daily 3. Pain Management: Oxycodone/Robaxin as needed  12/21- pain controlled- 4-5/10- con't regimen 4. Mood: Zoloft 150 mg daily, trazodone 100 mg nightly             -antipsychotic agents: Latuda 20 mg daily 5. Neuropsych: This patient is capable of making decisions on his own behalf. 6. Skin/Wound Care:  continue vac per surgery             -added eucerin cream for RLE  -vac change MWF  -vac to be d/c'ed 12/26  -discussed potential wound care needs at home with pt 7. Fluids/Electrolytes/Nutrition: eating fairly well  12/22 BUN sl down to 24 but up from prior numbers -need to keep pushing fluids 12/24 recheck labs monday  8.  Acute blood loss anemia.  Follow-up CBC 9.  Diabetes mellitus.  Hemoglobin A1c 6.1.  Semglee 10 units nightly.  Check blood sugars before meals and at bedtime.  Diabetic teaching CBG (last 3)  Recent Labs    03/03/21 1615 03/03/21 2045 03/04/21 0547  GLUCAP 159* 117* 113*  12/24- CBGs reasonably controlled with some variance 10.  Hypertension.  Norvasc 10 mg daily.  Monitor  with increased mobility Vitals:   03/03/21 1919 03/04/21 0420  BP: 93/72 108/77  Pulse: 90 92  Resp: 17 18  Temp: 98.6 F (37 C) 98.4 F (36.9 C)  SpO2: 100% 97%   12/24 controlled. Avoid hypotension  11.  Hyperlipidemia.  Lipitor 12.  History of CVA with residual left-sided weakness.  Continue aspirin and Plavix 13.  Morbid obesity.  BMI 37.74.  Dietary follow-up 14.  Constipation.  MiraLAX twice daily as needed.  12/25 no bm yesterday, repeat sorbitol, sse today if still no bm     LOS: 9 days A FACE TO FACE EVALUATION WAS PERFORMED  Ranelle Oyster 03/04/2021, 7:29 AM

## 2021-03-05 LAB — CBC
HCT: 27.2 % — ABNORMAL LOW (ref 39.0–52.0)
Hemoglobin: 9.2 g/dL — ABNORMAL LOW (ref 13.0–17.0)
MCH: 28.8 pg (ref 26.0–34.0)
MCHC: 33.8 g/dL (ref 30.0–36.0)
MCV: 85.3 fL (ref 80.0–100.0)
Platelets: 420 10*3/uL — ABNORMAL HIGH (ref 150–400)
RBC: 3.19 MIL/uL — ABNORMAL LOW (ref 4.22–5.81)
RDW: 15.3 % (ref 11.5–15.5)
WBC: 8.3 10*3/uL (ref 4.0–10.5)
nRBC: 0 % (ref 0.0–0.2)

## 2021-03-05 LAB — GLUCOSE, CAPILLARY
Glucose-Capillary: 106 mg/dL — ABNORMAL HIGH (ref 70–99)
Glucose-Capillary: 116 mg/dL — ABNORMAL HIGH (ref 70–99)
Glucose-Capillary: 114 mg/dL — ABNORMAL HIGH (ref 70–99)
Glucose-Capillary: 155 mg/dL — ABNORMAL HIGH (ref 70–99)

## 2021-03-05 LAB — BASIC METABOLIC PANEL
Anion gap: 11 (ref 5–15)
BUN: 23 mg/dL (ref 8–23)
CO2: 23 mmol/L (ref 22–32)
Calcium: 9 mg/dL (ref 8.9–10.3)
Chloride: 103 mmol/L (ref 98–111)
Creatinine, Ser: 1.13 mg/dL (ref 0.61–1.24)
GFR, Estimated: >60 mL/min (ref >60)
Glucose, Bld: 115 mg/dL — ABNORMAL HIGH (ref 70–99)
Potassium: 4.2 mmol/L (ref 3.5–5.1)
Sodium: 137 mmol/L (ref 135–145)

## 2021-03-05 MED ORDER — COLLAGENASE 250 UNIT/GM EX OINT
TOPICAL_OINTMENT | Freq: Every day | CUTANEOUS | Status: DC
  Filled 2021-03-05 (×2): qty 30

## 2021-03-05 NOTE — Progress Notes (Signed)
[facility] [place] PROGRESS NOTE  ASSESSMENT / PLAN: Adam Perez is a 65 y.o. male s/p L TMA and venous wound debridement with skin substitute placement. Venous ulcer healing very well. Almost completely granulated. TMA appears marginal. Will monitor. Good doppler flow in the foot.   SUBJECTIVE: No complaints. Wounds evaluated with WOC RN Chip Boer.  OBJECTIVE: BP 108/69    Pulse 80    Temp 98.3 F (36.8 C)    Resp 18    Ht 5\' 7"  (1.702 m)    Wt 104.3 kg    SpO2 99%    BMI 36.01 kg/m   Intake/Output Summary (Last 24 hours) at 03/05/2021 0927 Last data filed at 03/05/2021 0645 Gross per 24 hour  Intake 720 ml  Output 1700 ml  Net -980 ml    No distress RRR Unlabored Soft abdomen LLE wounds:     Strong DS in the L DP  CBC Latest Ref Rng & Units 03/05/2021 02/26/2021 02/22/2021  WBC 4.0 - 10.5 K/uL 8.3 8.3 9.6  Hemoglobin 13.0 - 17.0 g/dL 02/24/2021) 7.8(E) 4.2(P)  Hematocrit 39.0 - 52.0 % 27.2(L) 27.9(L) 24.6(L)  Platelets 150 - 400 K/uL 420(H) 433(H) 329     CMP Latest Ref Rng & Units 03/05/2021 03/01/2021 02/26/2021  Glucose 70 - 99 mg/dL 02/28/2021) 144(R) 154(M)  BUN 8 - 23 mg/dL 23 086(P) 61(P)  Creatinine 0.61 - 1.24 mg/dL 50(D 3.26 7.12  Sodium 135 - 145 mmol/L 137 135 135  Potassium 3.5 - 5.1 mmol/L 4.2 4.2 4.2  Chloride 98 - 111 mmol/L 103 105 104  CO2 22 - 32 mmol/L 23 25 25   Calcium 8.9 - 10.3 mg/dL 9.0 8.9 4.58)  Total Protein 6.5 - 8.1 g/dL - - 6.7  Total Bilirubin 0.3 - 1.2 mg/dL - - 0.7  Alkaline Phos 38 - 126 U/L - - 63  AST 15 - 41 U/L - - 30  ALT 0 - 44 U/L - - 52(H)   . 0.9(X, MD [facility] Phone Number: [phone] 03/05/2021 9:27 AM

## 2021-03-05 NOTE — Progress Notes (Signed)
Occupational Therapy Session Note  Patient Details  Name: Adam Perez MRN: [id number] Date of Birth: 1955/09/08  Today's Date: 03/05/2021 OT Individual Time: 9747-1855 OT Individual Time Calculation (min): 43 min    Short Term Goals: Week 1:  OT Short Term Goal 1 (Week 1): Pt will independtly recall LLE PWB restrictions. OT Short Term Goal 1 - Progress (Week 1): Met OT Short Term Goal 2 (Week 1): Pt will don pants with max A + AE PRN. OT Short Term Goal 2 - Progress (Week 1): Met OT Short Term Goal 3 (Week 1): Pt will complete toilet transfer with max A and LRAD. OT Short Term Goal 3 - Progress (Week 1): Met OT Short Term Goal 4 (Week 1): Pt will complete standing grooming task with no more than min cues to adhere to LLE PWB precautions. OT Short Term Goal 4 - Progress (Week 1): Progressing toward goal Week 2:  OT Short Term Goal 1 (Week 2): STGs = LTGs   Skilled Therapeutic Interventions/Progress Updates:    Pt in bed at time of session finishing up with wound care nurse fir LLE. Pt recounting recent medical events at beginning of session, noted to be hyperveral and muttering to self during conversation. Pt redirected to tasks. Extended time at beginning of session in discussion with pt as he was in pain (but premedicated and no pain meds due for a couple hours per pt) for what he felt up to performing. Pt declined ADL tasks, but wanted to get up and OOB. Supine > sit Supervision and therapist donning DARCO shoe (pt did want want to have this on but educated that it must be on post op for transfer). Wife entered at this time and remained throughout. MD and PA entered at this time as well for morning rounds and time spent in discussion. After leaving, resumed session and pt squat pivot bed > wheelchair CGA/Min A toward L side. LLE noted to be bleeding under bandaged, nursing notified and reinforced during session. Pt wanting to remain up in chair, alarm on call bell in reach and wife in  room.      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

## 2021-03-05 NOTE — Consult Note (Addendum)
WOC Nurse wound follow up Patient receiving care in [facility] 4M05 Dr. Heath Lark present for dressing removal. Wound Vac discontinued today.  Wound type: venous wound debridement Wound bed: 50% red granulation tissue with 50% yellow/skin substitute remaining. (See photos in note from Dr. [name] today). Hardened bulge at the posterior side just below the knee that was evaluated by Dr. [name]. Compression recommended with qod changes.  Drainage (amount, consistency, odor) serosanguinous Periwound: intact Dressing procedure/placement/frequency: Ace wrap, Kerlix and 1 black foam removed. The wound was cleaned and petrolatum gauze covered over the wound until Santyl can arrive from the pharmacy. Orders will be as follows: clean the wound gently with NS. Apply a nickel thick layer of Santyl over the yellow area of the wound. Cover with moistened saline gauze, dry gauze, ABD pad and secure with Kerlix and Coban. Wrap from the amputation site at the tip of the foot to just below the knee with Kerlix and Coban [place] [phone]). Change every other day.   Monitor the wound area(s) for worsening of condition such as: Signs/symptoms of infection, increase in size, development of or worsening of odor, development of pain, or increased pain at the affected locations.   Notify the medical team if any of these develop.  Thank you for the consult. WOC nurse will not follow at this time.   Please re-consult the WOC team if needed.  Renaldo Reel Katrinka Blazing, MSN, RN, CMSRN, Angus Seller, [facility] Wound Treatment Associate Pager [phone]

## 2021-03-05 NOTE — Progress Notes (Signed)
Occupational Therapy Session Note  Patient Details  Name: Adam Perez MRN: [id number] Date of Birth: 11-25-1955  Today's Date: 03/05/2021 OT Individual Time: 5956-3875 OT Individual Time Calculation (min): 49 min    Short Term Goals: Week 2:  OT Short Term Goal 1 (Week 2): STGs = LTGs  Skilled Therapeutic Interventions/Progress Updates: TPaitne sitting up in wi/c upon approach for OT Although patient complained in 8/10 and that nursing had just given pain meds 10 minutes prior the particpated in skilled OT intervention to attempt transfer. He completed w/c to/fr bed squat pivot with CGA both directions and required 1 cue to not weight bear through foot or keep weight only on heel.   Patient complained of continued pain and requested to not complete more transfers or sit to stand so that when nursing was to come in in approximately 45 more minutes his pain could be a bit less by his keep his left lower extremity in a stationary posiiton.    Patient did resort to complete UE therapeutic exercise and core stability to help increase indepedence with transfers and safety seated in wheelchair and on edge of surfaces.    Patient tended to lean backwards without buttocks back in the chair.    He required education for techique x 2 during session to scoot back in chair.  Patient with difficulty maintain  core endurance, srength and stability to sit uipright in chair during upper extremity exercises, and to strengthen arms for transfers and moving surface to surface as well as sit to stand.  Offered another therapy opportunity early afternoon.   Howver, patient stated during bandage change and extraction of wound vac lower extremity bleeding and staff ordered and was waiting on on the proper bandage to arrive for patient to help contain bleeding and ensure continued proper healing.  He stated that additionally his leg pain was too great to work at that [employer] T time of additional therapy and that he would have to  w waiti until later in the afternoon after taking scheduled medications.  Patient will benefit from continued opportunities to maintain core stability, strength and and endurance to sit upright during ADLs, sitting unsupported, and when complete self care and leisure activities such as donning arms and shirt overhead, from hanging clothing, reaching into cabinets, accessing his refrigerator, and others.   As well, patient voice interest in becoming as independent as possible in order to live safely and indpendently home alone.   Additional transfers training and balance will also help with his goals and safety.    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

## 2021-03-05 NOTE — Discharge Summary (Signed)
Physician Discharge Summary  Patient ID: KIYOTO BLANKLEY MRN: [id number] DOB/AGE: [birth year] 65 y.o.  Admit date: 02/23/2021 Discharge date: 03/07/2021  Discharge Diagnoses:  Principal Problem:   History of transmetatarsal amputation of left foot ([place]) DVT prophylaxis Acute blood loss anemia Diabetes mellitus Hypertension Hyperlipidemia History of CVA Morbid obesity Constipation  Discharged Condition: Stable  Significant Diagnostic Studies: DG Tibia/Fibula Left  Result Date: 02/14/2021 CLINICAL DATA:  pain, open wound lower leg, hx DM EXAM: LEFT TIBIA AND FIBULA - 2 VIEW COMPARISON:  None. FINDINGS: There is a soft tissue wound along the lateral lower leg. Diffuse soft tissue swelling. There is mild periostitis along the lateral aspect of the distal fibula. No acute fracture. IMPRESSION: Diffuse lower leg soft tissue swelling with large lateral lower leg soft tissue wound. Adjacent mild periostitis of the lateral aspect of the distal fibula, concerning for possible developing bone infection. Electronically Signed   By: Maurine Simmering M.D.   On: 02/14/2021 14:36   MR TIBIA FIBULA LEFT WO CONTRAST  Result Date: 02/14/2021 CLINICAL DATA:  Open wounds left lower extremity. EXAM: MRI OF LOWER LEFT EXTREMITY WITHOUT CONTRAST TECHNIQUE: Multiplanar, multisequence MR imaging of the left lower extremity was performed. No intravenous contrast was administered. COMPARISON:  Radiographs, same date. FINDINGS: Examination limited by patient motion. There is diffuse and marked subcutaneous soft tissue swelling/edema/fluid suggesting cellulitis. There is a moderate-sized open wound noted along the lateral aspect of the lower leg but I do not see a discrete fluid collection to suggest a drainable abscess. Moderate fluid surrounding the calf muscle fascia. Edema like signal changes in the gastroc muscles bilaterally suggesting myositis. The soleus muscles are unremarkable. No findings suspicious for  pyomyositis. The tibia and fibula are intact.  No findings for osteomyelitis. IMPRESSION: 1. Diffuse and marked subcutaneous soft tissue swelling/edema/fluid suggesting cellulitis. 2. Moderate-sized open wound along the lateral aspect of the lower leg but I do not see a discrete fluid collection to suggest a drainable abscess. 3. Edema like signal changes in the gastroc muscles bilaterally suggesting myositis. 4. No findings for pyomyositis or osteomyelitis. Electronically Signed   By: Marijo Sanes M.D.   On: 02/14/2021 18:54   MR FOOT LEFT WO CONTRAST  Result Date: 02/14/2021 CLINICAL DATA:  Foot pain and swelling. EXAM: MRI OF THE LEFT FOOT WITHOUT CONTRAST TECHNIQUE: Multiplanar, multisequence MR imaging of the left foot was performed. No intravenous contrast was administered. COMPARISON:  Radiographs, same date. FINDINGS: Marked subcutaneous soft tissue swelling/edema/fluid consistent with cellulitis. Open wounds suspected involving the first, second and third toes dorsally. Abnormal T1 and T2 signal intensity involving the distal aspect of the second proximal phalanx, middle phalanx and the distal phalanx consistent with osteomyelitis. There is also abnormal signal intensity in the distal phalanx of the third toe consistent with osteomyelitis. No discrete drainable soft tissue abscess is identified. There is mild diffuse myositis but no findings suspicious for pyomyositis. The metatarsal and tarsal bones are intact. IMPRESSION: 1. Marked subcutaneous soft tissue swelling/edema/fluid consistent with cellulitis. 2. Osteomyelitis involving the entire second toe and the distal phalanx of the third toe. 3. No discrete drainable soft tissue abscess. 4. Mild diffuse myositis but no findings suspicious for pyomyositis. Electronically Signed   By: Marijo Sanes M.D.   On: 02/14/2021 18:59   MR HIP LEFT WO CONTRAST  Result Date: 02/14/2021 CLINICAL DATA:  Left hip pain. EXAM: MR OF THE LEFT HIP WITHOUT CONTRAST  TECHNIQUE: Multiplanar, multisequence MR imaging was performed. No intravenous contrast was  administered. COMPARISON:  Radiographs, same date. FINDINGS: Both hips are normally located. Mild degenerative changes bilaterally. No stress fracture or AVN. No hip joint effusion. No periarticular fluid collections to suggest a paralabral cyst. Labral degenerative changes with suspected labral tears. The hip and pelvic musculature are unremarkable. No muscle tear, myositis or mass. No findings for peritendinitis or trochanteric bursitis. The hamstring tendons are intact. No significant intrapelvic abnormalities are identified. Mild prostate gland enlargement is noted. Small bilateral inguinal lymph nodes but no mass or adenopathy. No inguinal hernia. IMPRESSION: 1. Mild bilateral hip joint degenerative changes but no stress fracture or AVN. 2. Labral degenerative changes with suspected labral tears. 3. Intact bony pelvis. 4. No significant intrapelvic abnormalities. Electronically Signed   By: Marijo Sanes M.D.   On: 02/14/2021 18:50   PERIPHERAL VASCULAR CATHETERIZATION  Result Date: 02/15/2021 DATE OF SERVICE: 02/15/2021  PATIENT:  Adam Perez  65 y.o. 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 Stanford Breed, MD  ASSISTANT: none  ANESTHESIA:   local and IV sedation  ESTIMATED BLOOD LOSS: minimal  LOCAL MEDICATIONS USED:  LIDOCAINE  COUNTS: confirmed correct.  PATIENT DISPOSITION:  PACU - hemodynamically stable.  Delay start of Pharmacological VTE agent (>24hrs) due to surgical blood loss or risk of bleeding: no  INDICATION FOR PROCEDURE: [name] is a 65 y.o. male with left diabetic foot  infection in setting of atherosclerosis. After careful discussion of risks, benefits, and alternatives the patient was offered angiography. The patient understood and wished to proceed.  OPERATIVE FINDINGS: Terminal aorta and iliac arteries: Widely patent, tortuous, without flow limiting stenosis.  Left lower extremity: Common femoral artery: patent without flow limiting stenosis Profunda femoris artery: patent without flow limiting stenosis Superficial femoral artery: patent without flow limiting stenosis Popliteal artery: distal artery critically stenosed at junction with TP trunk Anterior tibial artery: occluded Tibioperoneal trunk: proximal artery critically stenosed at junction with popliteal artery Peroneal artery: only calf vessel patent, flows to ankle and arborizes to pedal circulation Posterior tibial artery: occluded Pedal circulation: fills via collateral flow from peroneal artery  WIfI score 2/1/2 - clinical stage 4. Risk of amputation high. Benefit of revascularization moderate.  DESCRIPTION OF PROCEDURE: After identification of the patient in the pre-operative holding area, the patient was transferred to the operating room. The patient was positioned supine on the operating room table. Anesthesia was induced. The groins was prepped and draped in standard fashion. A surgical pause was performed confirming correct patient, procedure, and operative location.  The right groin was anesthetized with subcutaneous injection of 1% lidocaine. Using ultrasound guidance, the right common femoral artery was accessed with micropuncture technique. Fluoroscopy was used to confirm cannulation over the femoral head. The 31F sheath was upsized to 39F.  A Benson wire was advanced into the distal aorta. Over the wire an omni flush catheter was advanced to the level of L2. Aortogram was performed - see above for details.  The left common iliac artery was selected with a glidewire guidewire. The wire was advanced into the  common femoral artery. Over the wire the omni flush catheter was advanced into the external iliac artery. Selective angiography was performed - see above for details.  A  mynx device was used to close the arteriotomy. Hemostasis was excellent upon completion.  Conscious sedation was administered with the use of IV fentanyl and midazolam under continuous physician and nurse monitoring.  Heart rate, blood pressure, and oxygen saturation were continuously monitored.  Total sedation time was 37 minutes  Upon completion of the case instrument and sharps counts were confirmed correct. The patient was transferred to the PACU in good condition. I was present for all portions of the procedure.  PLAN: Could not tolerate angiogram with moderate sedation. Will need revascularization to maximize chances of wound healing. Plan pop/TP trunk endarterectomy, toe amputation, venous ulcer debridement in OR Monday.  Yevonne Aline. Stanford Breed, MD [facility] Phone Number: [phone] 02/15/2021 1:27 PM   DG Ang/Ext/Uni/Or Left  Result Date: 02/19/2021 CLINICAL DATA:  Popliteal endarterectomy EXAM: LEFT ANG/EXT/UNI/ OR CONTRAST:  Not provided FLUOROSCOPY TIME:  Fluoroscopy Time:  6 minutes 38 seconds Radiation Exposure Index (if provided by the fluoroscopic device): 84 mGy Number of Acquired Spot Images: 0 COMPARISON:  None. FINDINGS: Seven intraoperative cine clips were provided for interpretation. The submitted images demonstrate left lower extremity angiogram. There is high-grade focal stenosis of the distal popliteal artery which significantly following intervention. Single-vessel runoff to the ankle likely through peroneal artery. IMPRESSION: Intraoperative images of left lower extremity angiogram as above. Electronically Signed   By: Miachel Roux M.D.   On: 02/19/2021 10:16   DG Foot Complete Left  Result Date: 02/14/2021 CLINICAL DATA:  pain, open wound lower leg, hx DM EXAM: LEFT FOOT -  COMPLETE 3+ VIEW COMPARISON:  None. FINDINGS: Diffuse soft tissue swelling of the foot. There is no evidence of acute fracture. Vascular calcifications. Mild midfoot arthritis. No frank bony destruction. IMPRESSION: Diffuse soft tissue swelling without evidence of acute osseous abnormality in the left foot. Electronically Signed   By: Maurine Simmering M.D.   On: 02/14/2021 14:38   VAS [place] ABI WITH/WO TBI  Result Date: 02/15/2021  LOWER EXTREMITY DOPPLER STUDY Patient Name:  REYNALD WINKFIELD  Date of Exam:   02/15/2021 Medical Rec #: [id number]       Accession #:    [id number] Date of Birth: 05-09-1955       Patient Gender: M Patient Age:   14 years Exam Location:  [facility] Procedure:      VAS [place] ABI WITH/WO TBI Referring Phys: Jamelle Haring --------------------------------------------------------------------------------  Indications: Gangrene. High Risk Factors: Hypertension, Diabetes.  Limitations: Today's exam was limited due to an open wound. Comparison Study: no prior Performing Technologist: Archie Patten RVS  Examination Guidelines: A complete evaluation includes at minimum, Doppler waveform signals and systolic blood pressure reading at the level of bilateral brachial, anterior tibial, and posterior tibial arteries, when vessel segments are accessible. Bilateral testing is considered an integral part of a complete examination. Photoelectric Plethysmograph (PPG) waveforms and toe systolic pressure readings are included as required and additional duplex testing as needed. Limited examinations for reoccurring indications may be performed as noted.  ABI Findings: +--------+------------------+-----+---------+-----------+  Right    Rt Pressure (mmHg) Index Waveform  Comment      +--------+------------------+-----+---------+-----------+  Brachial 144                      triphasic              +--------+------------------+-----+---------+-----------+  PTA  not audible   +--------+------------------+-----+---------+-----------+  DP       165                1.15  triphasic              +--------+------------------+-----+---------+-----------+ +--------+------------------+-----+----------+-----------+  Left     Lt Pressure (mmHg) Index Waveform   Comment      +--------+------------------+-----+----------+-----------+  Brachial 139                      triphasic               +--------+------------------+-----+----------+-----------+  PTA                                          not audible  +--------+------------------+-----+----------+-----------+  DP       111                0.77  monophasic              +--------+------------------+-----+----------+-----------+ +-------+-----------+-----------+------------+------------+  ABI/TBI Today's ABI Today's TBI Previous ABI Previous TBI  +-------+-----------+-----------+------------+------------+  Right   1.15                                               +-------+-----------+-----------+------------+------------+  Left    0.77                                               +-------+-----------+-----------+------------+------------+  Summary: Right: Resting right ankle-brachial index is within normal range. No evidence of significant right lower extremity arterial disease. Unable to obtain toe pressures. Left: Resting left ankle-brachial index indicates moderate left lower extremity arterial disease. Unable to obtain toe pressures.  *See table(s) above for measurements and observations.  Electronically signed by Orlie Pollen on 02/15/2021 at 5:15:47 PM.    Final    DG Hip Unilat W or Wo Pelvis 2-3 Views Left  Result Date: 02/14/2021 CLINICAL DATA:  Pain.  Open wound lower leg EXAM: DG HIP (WITH OR WITHOUT PELVIS) 2-3V LEFT COMPARISON:  None. FINDINGS: Hip joints and SI joints symmetric and unremarkable. Degenerative changes in the visualized lower lumbar spine. No acute bony abnormality. Specifically, no fracture, subluxation, or  dislocation. Soft tissues are intact. IMPRESSION: No acute bony abnormality. Electronically Signed   By: Rolm Baptise M.D.   On: 02/14/2021 14:33    Labs:  Basic Metabolic Panel: Recent Labs  Lab 03/01/21 0504 03/05/21 0659  NA 135 137  K 4.2 4.2  CL 105 103  CO2 25 23  GLUCOSE 107* 115*  BUN 24* 23  CREATININE 1.09 1.13  CALCIUM 8.9 9.0    CBC: Recent Labs  Lab 03/05/21 0659  WBC 8.3  HGB 9.2*  HCT 27.2*  MCV 85.3  PLT 420*    CBG: Recent Labs  Lab 03/05/21 1653 03/05/21 2144 03/06/21 0618 03/06/21 1130 03/06/21 2058  GLUCAP 114* 155* 117* 112* 118*   Family history.  Brother with diabetes.  Denies any colon cancer esophageal cancer or rectal cancer  Brief HPI:   Adam Perez is a 65 y.o. right-handed male with history of  hypertension CVA with left-sided residual weakness maintained on aspirin and Plavix diabetes mellitus morbid obesity with BMI 37.74.  Per chart review patient currently separated from his wife lives alone with plans to stay with his wife on discharge.  Modified independent prior to admission.  Presented 02/14/2021 with left side hip pain and malodorous wounds in the lower extremities including the toes and left foot.  Reports the wound to been progressing over the last 2 months and is followed at the [facility] clinic in [place].  Admission chemistries potassium 5.2 glucose 122 BUN 26 creatinine 1.31 hemoglobin 10.7 lactic acid 2.5 blood cultures no growth to date sedimentation rate 76.  MRI of the left foot showed marked subcutaneous soft tissue swelling edema fluid consistent with cellulitis.  Osteomyelitis involving the entire second toe and distal phalanx of the third toe.  MRI of the left hip mild bilateral hip degenerative changes but no stress fracture or AVN.  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 Heath Lark and wound VAC applied and discontinued 03/05/2021.   Partial weightbearing left lower extremity through heel only with Darco shoe.  Maintained on Lovenox for DVT prophylaxis.  His chronic Plavix and aspirin were resumed.  Acute blood loss anemia 8.5 and monitored.  Therapy evaluations completed due to patient decreased functional mobility was admitted for a comprehensive rehab program.   Hospital Course: [facility] was admitted to rehab 02/23/2021 for inpatient therapies to consist of PT, ST and OT at least three hours five days a week. Past admission physiatrist, therapy team and rehab RN have worked together to provide customized collaborative inpatient rehab.  Pertaining to patient's functional deficits secondary to left popliteal angioplasty debridement followed by left TMA 02/19/2021.  Partial weightbearing left lower extremity with heel with Darco shoe.  Patient would follow-up vascular surgery.  His wound VAC was discontinued and dressing changes as indicated by vascular surgery.  Lovenox for DVT prophylaxis.  His chronic aspirin and Plavix have been resumed no bleeding episodes.  Pain managed with use of oxycodone Robaxin as needed.  Mood stabilization with Zoloft as well as Latuda with trazodone at bedtime.  He was attending full therapies.  Acute blood loss anemia stable with no active bleeding.  Blood sugars overall controlled hemoglobin A1c 6.1 insulin therapy as directed with diabetic teaching.  His Amaryl as well as Glucophage remained on hold with blood sugars 114-116-117 and could be resumed as outpatient follow-up per PCP.  His  blood pressure controlled on Norvasc and his Zestoretic remained on hold with blood pressure 109/75.  He remains on Lipitor for history of hyperlipidemia.  History of CVA with residual left-sided weakness continued on aspirin and Plavix as prior to admission.  Morbid obesity BMI 37.74 with dietary follow-up.   Blood pressures were monitored on TID basis and controlled  Diabetes has been monitored with ac/hs CBG  checks and SSI was use prn for tighter BS control.    Rehab course: During patient's stay in rehab weekly team conferences were held to monitor patient's progress, set goals and discuss barriers to discharge. At admission, patient required minimal guard 48 feet rolling walker max assist sit to stand  Physical exam.  Blood pressure 124/84 pulse 86 temperature 97.9 respirations 18 oxygen saturations 97% room air Constitutional.  No acute distress HEENT Head.  Normocephalic and atraumatic Eyes.  Pupils round and reactive to light no discharge without nystagmus Neck.  Supple nontender no JVD without thyromegaly Cardiac  regular rate and rhythm any extra sounds or murmur heard Abdomen.  Soft nontender positive bowel sounds without rebound Respiratory effort normal no respiratory distress without wheeze Skin.  Left lower extremity TMA site dressed with full Ace wrap through bypass site.  VAC in place. Neurologic.  Alert oriented x3 follows commands.  Right upper extremity and left upper extremity grossly graded 4+/5.  Left lower extremity with flexor tone and limited testing due to pain.  Right upper extremity 4-4+/5 proximal to distal    He/She  has had improvement in activity tolerance, balance, postural control as well as ability to compensate for deficits. He/She has had improvement in functional use RUE/LUE  and RLE/LLE as well as improvement in awareness.  Perform lateral board transfers edge of bed wheelchair minimal assist.  Self propelled wheelchair supervision.  Blocked sit to stand practice bilateral upper extremity support contact-guard throughout.  Ambulates short distances with a rolling walker.  Use slide board to mat with supervision cues using scoots on the board versus sliding.  Completed shaving and stood briefly at sink to lower pants bathe bottom with contact-guard to stabilize.  He stood up with close supervision using the sink for support.  Full family teaching completed plan  discharged to home       Disposition: Discharge to home    Diet: Diabetic diet  Special Instructions: No driving smoking or alcohol   Follow-up with PCP on resuming Amaryl all as well as Glucophage.  Zestoretic remained on hold.  Santyl apply to left lower extremity and a nickel thick layer, cover with moistened saline gauze, dry gauze ABD pad and wrap with Kerlix and Coban change every other day  Medications at discharge. 1.  Norvasc 10 mg daily 2.  Aspirin 81 mg p.o. daily 3.  Lipitor 40 mg p.o. daily 4.  Plavix 75 mg p.o. daily 5.  Semglee 10 units nightly 6.  Latuda 20 mg p.o. daily 7.  Robaxin 500 mg every 6 hours as needed muscle spasms 8.  Multivitamin daily 9.  Oxycodone 1 to 2 tablets every 4 hours as needed pain 10.  MiraLAX twice daily hold for loose stools 11.  Senokot S2 tablets nightly 12.  Zoloft 150 mg p.o. daily 13.  Trazodone 100 mg p.o. nightly  30-35 minutes was spent completing discharge summary and discharge planning     Follow-up Information     Lovorn, Jinny Blossom, MD Follow up.   Specialty: Physical Medicine and Rehabilitation Why: No formal follow-up needed Contact information: [id number] [place] [address] [phone]         Cherre Robins, MD Follow up.   Specialties: Vascular Surgery, Interventional Cardiology Why: Call for appointment Contact information: [address] [place] [address] [phone]                 Signed: Cathlyn Parsons 03/07/2021, 5:24 AM

## 2021-03-05 NOTE — Progress Notes (Signed)
Physical Therapy Weekly Progress Note  Patient Details  Name: Adam Perez MRN: [id number] Date of Birth: [birth year]  Beginning of progress report period: February 24, 2021 End of progress report period: March 05, 2021  Today's Date: 03/05/2021 PT Individual Time: 1245-8099 PT Individual Time Calculation (min): 88 min   Patient has met 2 of 3 short term goals. Ambulation goal has been discontinued due to safety concerns and plan for pt to DC at [facility] level. Pt is performing lateral scoot and lateral board transfers to R side consistently w/CGA. Pt able to self-propel in WC >200' using hemi technique w/S*. Pt to DC to ex-wife's house at S* level. Caregiver has participated in family training and is aware pt is unlikely to adhere to OT/PT recommendations at home. Caregiver able to provide the required physical assistance that pt requires at home.   Patient continues to demonstrate the following deficits muscle weakness, muscle joint tightness, and L hamstring contracture/sustained clonus, decreased cardiorespiratoy endurance, and decreased standing balance, hemiplegia, decreased balance strategies, and difficulty maintaining precautions and therefore will continue to benefit from skilled PT intervention to increase functional independence with mobility.  Patient progressing toward long term goals..  Continue plan of care.  PT Short Term Goals Week 1:  PT Short Term Goal 1 (Week 1): Pt will trasnfer to and from bed with min assist consistently with SB PT Short Term Goal 1 - Progress (Week 1): Met PT Short Term Goal 2 (Week 1): Pt will perform WC mobiltiy with supervision assist PT Short Term Goal 2 - Progress (Week 1): Met PT Short Term Goal 3 (Week 1): Pt will ambulate 108f with max assist and LRAD PT Short Term Goal 3 - Progress (Week 1): Discontinued (comment) (Pt not a functional ambulator[facility] Week 2:  PT Short Term Goal 1 (Week 2): STG = LTG due to LOS  Skilled  Therapeutic Interventions/Progress Updates:  Pt received seated in WC in room, L ankle wrapped in blood-soaked gauze. Pt reported 8/10 pain in L ankle and nursing present to administer pain meds. Pt's ex-wife present for session for family training. Emphasis of session on transfers, pt/fam education and management of WC. Pt hyperverbal throughout session and frequently interrupted therapist while attempting to educate pt and caregiver. Pt made several comments regarding therapist's gender throughout session and frequently requested therapist "go get a man to do this". Ensured pt that therapist highly qualified to perform her job, in which pt scoffed. Encouraged pt to donn Darco shoe for session, but pt declined.   Pt self-propelled >100' from room to ortho gym using hemi-technique w/S* and set-up assist of L legrest. Instructed pt to set up board transfer to car, pt unable to recall correct sequence and attempted to perform stand pivot w/LLE still in legrest. Lengthy discussion w/caregiver and pt regarding safe set up of slide board transfer into car. Pt adamant that he "did not need that thing", reminded pt that it was unsafe for him to stand or attempt transfer without a board due to his L hemiplegia and global deconditioning. Pt and caregiver attempted car transfer together, noted pt removed armrest on L side prior to removing L leg rest and attempted to lean to L to remove legrest and almost fell out of WC, requiring mod A for trunk stabilization. Lengthy discussion regarding safety awareness in chair and not rushing through transfer, in which pt became defensive. Pt able to complete lateral board transfer to L side into car w/min  A for board placement and CGA for actual transfer. As pt was swinging legs into car, noted he threw his LLE into car and let it slam onto floor. Lengthy discussion regarding being cautious while moving his LLE to reduce risk of further injury, in which pt reported he "was in the  [employer] and they did not teach you to be careful". Pt very difficult to educate and cue as he does not demonstrate active listening or recall skills. Caregiver aware that pt will not adhere to OT/PT recommendations at home. Pt able to perform lateral board transfer out of car to R side w/CGA and self-propelled >100' back to room using hemi-technique w/S*.   Remainder of session spent on pt/fam edu and instructing caregiver on how to adjust pt's personal chair to hemi-height. Therapist adjusted chair to hemi-height w/assistance from caregiver while pt berated therapist regarding her gender and her "weakness", frequently requesting to obtain a male to adjust WC since "you do not know what you are doing". Assured pt therapist was qualified to adjust chair, in which pt laughed.   Pt performed lateral board transfer to R side from CIR's WC to personal WC w/CGA and caregiver. Pt adamant that he could not use board to transfer from chair <>chair and required max encouragement/education to perform transfer, as he believed he could only use board to transfer from bed <>chair. Lengthy discussion regarding purpose of slide board and energy conservation w/use of slide board, pt verbalized understanding.   While answering caregiver's questions regarding transfers at home, pt impulsively performed stand pivot transfer from [facility] to EOB precariously, no adherence to weightbearing precautions and no use of AD. Informed pt of risk of transfer, pt reported he "did it because I can". Sit <>supine w/CGA and pt was left supine in bed, nursing present for wound care, all needs in reach.  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

## 2021-03-05 NOTE — Progress Notes (Signed)
PROGRESS NOTE   Subjective/Complaints: C/o stiffness in right shoulder. Discussed heating pad and he would like to try.  Dressings changed this morning, family at bedside would like to learn dressing changes  ROS: Patient denies fever, rash, sore throat, blurred vision, nausea, vomiting, diarrhea, cough, shortness of breath or chest pain, headache, or mood change. +right shoulder stiffness    Objective:   No results found. Recent Labs    03/05/21 0659  WBC 8.3  HGB 9.2*  HCT 27.2*  PLT 420*    Recent Labs    03/05/21 0659  NA 137  K 4.2  CL 103  CO2 23  GLUCOSE 115*  BUN 23  CREATININE 1.13  CALCIUM 9.0     Intake/Output Summary (Last 24 hours) at 03/05/2021 1351 Last data filed at 03/05/2021 0900 Gross per 24 hour  Intake 580 ml  Output 1400 ml  Net -820 ml        Physical Exam: Vital Signs Blood pressure 108/69, pulse 80, temperature 98.3 F (36.8 C), resp. rate 18, height 5\' 7"  (1.702 m), weight 104.3 kg, SpO2 99 %. Gen: no distress, normal appearing HEENT: oral mucosa pink and moist, NCAT Cardio: Reg rate Chest: normal effort, normal rate of breathing Abd: soft, non-distended Ext: no edema Psych: pleasant, normal affect Skin: left foot recently bandaged MS:  left leg tender with palpation Skin: No evidence of breakdown, no evidence of rash, right foot dry, left leg dressed/vac. Vac cannister almost empty Neurologic: Cranial nerves II through XII intact, motor strength is 5/5 in Right , 3- Left deltoid, bicep, tricep, grip, hip flexor, knee extensors    Assessment/Plan: 1. Functional deficits which require 3+ hours per day of interdisciplinary therapy in a comprehensive inpatient rehab setting. Physiatrist is providing close team supervision and 24 hour management of active medical problems listed below. Physiatrist and rehab team continue to assess barriers to discharge/monitor patient  progress toward functional and medical goals  Care Tool:  Bathing    Body parts bathed by patient: Right arm, Left arm, Chest, Abdomen, Front perineal area, Right upper leg, Left upper leg, Face, Buttocks, Right lower leg   Body parts bathed by helper: Buttocks, Right lower leg Body parts n/a: Left lower leg   Bathing assist Assist Level: Contact Guard/Touching assist     Upper Body Dressing/Undressing Upper body dressing   What is the patient wearing?: Pull over shirt    Upper body assist Assist Level: Set up assist    Lower Body Dressing/Undressing Lower body dressing      What is the patient wearing?: Pants     Lower body assist Assist for lower body dressing: Minimal Assistance - Patient > 75%     Toileting Toileting    Toileting assist Assist for toileting: Minimal Assistance - Patient > 75% Assistive Device Comment: on BSC, A with clothing management   Transfers Chair/bed transfer  Transfers assist     Chair/bed transfer assist level: Contact Guard/Touching assist Chair/bed transfer assistive device: Sliding board (To R side)   Locomotion Ambulation   Ambulation assist   Ambulation activity did not occur: Safety/medical concerns  Walk 10 feet activity   Assist  Walk 10 feet activity did not occur: Safety/medical concerns        Walk 50 feet activity   Assist Walk 50 feet with 2 turns activity did not occur: Safety/medical concerns         Walk 150 feet activity   Assist Walk 150 feet activity did not occur: Safety/medical concerns         Walk 10 feet on uneven surface  activity   Assist Walk 10 feet on uneven surfaces activity did not occur: Safety/medical concerns         Wheelchair     Assist Is the patient using a wheelchair?: Yes Type of Wheelchair: Manual    Wheelchair assist level: Supervision/Verbal cueing Max wheelchair distance: 120ft    Wheelchair 50 feet with 2 turns  activity    Assist        Assist Level: Supervision/Verbal cueing   Wheelchair 150 feet activity     Assist      Assist Level: Supervision/Verbal cueing   Blood pressure 108/69, pulse 80, temperature 98.3 F (36.8 C), resp. rate 18, height 5\' 7"  (1.702 m), weight 104.3 kg, SpO2 99 %.  Medical Problem List and Plan: 1. 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                -patient may shower if LLE is covered             -ELOS/Goals: 12/28 supervision to min assist goals with PT, OT  -Continue CIR therapies including PT, OT   2.  Impaired mobility: Continue Lovenox             -antiplatelet therapy: Aspirin 81 mg daily and Plavix 25 mg daily 3. Pain: continue Oxycodone/Robaxin as needed  12/21- pain controlled- 4-5/10- con't regimen 4. Mood: Zoloft 150 mg daily, trazodone 100 mg nightly             -antipsychotic agents: Latuda 20 mg daily 5. Neuropsych: This patient is capable of making decisions on his own behalf. 6. Skin/Wound Care:  continue vac per surgery             -added eucerin cream for RLE  -vac change MWF  -vac to be d/c'ed 12/26  -discussed potential wound care needs at home with pt 7. Fluids/Electrolytes/Nutrition: eating fairly well  12/22 BUN sl down to 24 but up from prior numbers -need to keep pushing fluids 12/24 recheck labs monday  8.  Acute blood loss anemia.  Follow-up CBC 9.  Diabetes mellitus.  Hemoglobin A1c 6.1.  Semglee 10 units nightly.  Check blood sugars before meals and at bedtime.  Diabetic teaching CBG (last 3)  Recent Labs    03/04/21 2107 03/05/21 0605 03/05/21 1147  GLUCAP 144* 106* 116*  12/24- CBGs reasonably controlled with some variance 10.  Hypertension.  Norvasc 10 mg daily.  Monitor with increased mobility Vitals:   03/04/21 1821 03/05/21 0507  BP: 122/77 108/69  Pulse:  87 80  Resp: 18 18  Temp: 99.1 F (37.3 C) 98.3 F (36.8 C)  SpO2: 100% 99%   12/24 controlled. Avoid hypotension  11.  Hyperlipidemia.  Lipitor 12.  History of CVA with residual left-sided weakness.  Continue aspirin and Plavix 13.  Morbid obesity.  BMI 37.74.  Dietary follow-up 14.  Constipation.  MiraLAX twice daily as  needed.              12/25 no bm yesterday, repeat sorbitol, sse today if still no bm 15. Right shoulder stiffness: add kpad     LOS: 10 days A FACE TO FACE EVALUATION WAS PERFORMED  Sheetal Lyall P Stefanny Pieri 03/05/2021, 1:51 PM

## 2021-03-06 ENCOUNTER — Other Ambulatory Visit (HOSPITAL_COMMUNITY): Payer: Self-pay

## 2021-03-06 LAB — GLUCOSE, CAPILLARY
Glucose-Capillary: 117 mg/dL — ABNORMAL HIGH (ref 70–99)
Glucose-Capillary: 112 mg/dL — ABNORMAL HIGH (ref 70–99)
Glucose-Capillary: 118 mg/dL — ABNORMAL HIGH (ref 70–99)

## 2021-03-06 MED ORDER — OXYCODONE-ACETAMINOPHEN 5-325 MG PO TABS: 1.0000 | ORAL_TABLET | ORAL | 0 refills | Status: DC | PRN

## 2021-03-06 MED ORDER — METHOCARBAMOL 500 MG PO TABS
500.0000 mg | ORAL_TABLET | Freq: Four times a day (QID) | ORAL | 0 refills | Status: DC | PRN
  Filled 2021-03-06: qty 60, 15d supply, fill #0

## 2021-03-06 MED ORDER — SERTRALINE HCL 100 MG PO TABS: 150.0000 mg | ORAL_TABLET | Freq: Every day | ORAL | 0 refills | Status: AC

## 2021-03-06 MED ORDER — SERTRALINE HCL 100 MG PO TABS
150.0000 mg | ORAL_TABLET | Freq: Every day | ORAL | 0 refills | Status: DC
  Filled 2021-03-06: qty 45, 30d supply, fill #0

## 2021-03-06 MED ORDER — AMLODIPINE BESYLATE 10 MG PO TABS
10.0000 mg | ORAL_TABLET | Freq: Every day | ORAL | 0 refills | Status: DC
  Filled 2021-03-06: qty 30, 30d supply, fill #0

## 2021-03-06 MED ORDER — INSULIN GLARGINE 100 UNIT/ML SOLOSTAR PEN: 10.0000 [IU] | PEN_INJECTOR | Freq: Every day | SUBCUTANEOUS | 11 refills | Status: DC

## 2021-03-06 MED ORDER — OXYCODONE-ACETAMINOPHEN 5-325 MG PO TABS
1.0000 | ORAL_TABLET | ORAL | 0 refills | Status: DC | PRN
  Filled 2021-03-06: qty 30, 3d supply, fill #0

## 2021-03-06 MED ORDER — TRAZODONE HCL 100 MG PO TABS: 100.0000 mg | ORAL_TABLET | Freq: Every day | ORAL | 0 refills | Status: AC

## 2021-03-06 MED ORDER — ATORVASTATIN CALCIUM 40 MG PO TABS
40.0000 mg | ORAL_TABLET | Freq: Every day | ORAL | 0 refills | Status: DC
  Filled 2021-03-06: qty 30, 30d supply, fill #0

## 2021-03-06 MED ORDER — ATORVASTATIN CALCIUM 40 MG PO TABS: 40.0000 mg | ORAL_TABLET | Freq: Every day | ORAL | 0 refills | Status: DC

## 2021-03-06 MED ORDER — METHOCARBAMOL 500 MG PO TABS: 500.0000 mg | ORAL_TABLET | Freq: Four times a day (QID) | ORAL | 0 refills | Status: DC | PRN

## 2021-03-06 MED ORDER — TRAZODONE HCL 100 MG PO TABS
100.0000 mg | ORAL_TABLET | Freq: Every day | ORAL | 0 refills | Status: DC
  Filled 2021-03-06: qty 30, 30d supply, fill #0

## 2021-03-06 MED ORDER — INSULIN GLARGINE 100 UNIT/ML SOLOSTAR PEN
10.0000 [IU] | PEN_INJECTOR | Freq: Every day | SUBCUTANEOUS | 11 refills | Status: DC
Start: 2021-03-06 — End: 2021-04-09
  Filled 2021-03-06: qty 15, 150d supply, fill #0

## 2021-03-06 MED ORDER — LURASIDONE HCL 20 MG PO TABS: 20.0000 mg | ORAL_TABLET | Freq: Every day | ORAL | 0 refills | Status: AC

## 2021-03-06 MED ORDER — CLOPIDOGREL BISULFATE 75 MG PO TABS
75.0000 mg | ORAL_TABLET | Freq: Every day | ORAL | 0 refills | Status: DC
  Filled 2021-03-06: qty 30, 30d supply, fill #0

## 2021-03-06 MED ORDER — POLYETHYLENE GLYCOL 3350 17 G PO PACK: 17.0000 g | PACK | Freq: Two times a day (BID) | ORAL | 0 refills | Status: AC | PRN

## 2021-03-06 MED ORDER — CLOPIDOGREL BISULFATE 75 MG PO TABS: 75.0000 mg | ORAL_TABLET | Freq: Every day | ORAL | 0 refills | Status: DC

## 2021-03-06 MED ORDER — AMLODIPINE BESYLATE 10 MG PO TABS: 10.0000 mg | ORAL_TABLET | Freq: Every day | ORAL | 0 refills | Status: DC

## 2021-03-06 MED ORDER — LURASIDONE HCL 20 MG PO TABS
20.0000 mg | ORAL_TABLET | Freq: Every day | ORAL | 0 refills | Status: DC
Start: 2021-03-06 — End: 2021-03-06
  Filled 2021-03-06: qty 30, 30d supply, fill #0

## 2021-03-06 MED ORDER — SENNOSIDES-DOCUSATE SODIUM 8.6-50 MG PO TABS: 2.0000 | ORAL_TABLET | Freq: Every day | ORAL | Status: AC

## 2021-03-06 NOTE — Progress Notes (Signed)
Patient slept well throughout the night. No c/o pain. Nurse will continue to monitor.

## 2021-03-06 NOTE — Progress Notes (Signed)
PROGRESS NOTE   Subjective/Complaints: Left foot with some drainage. Tender at times. No new issues otherwise.  ROS: Patient denies fever, rash, sore throat, blurred vision, nausea, vomiting, diarrhea, cough, shortness of breath or chest pain,  headache, or mood change.    Objective:   No results found. Recent Labs    03/05/21 0659  WBC 8.3  HGB 9.2*  HCT 27.2*  PLT 420*    Recent Labs    03/05/21 0659  NA 137  K 4.2  CL 103  CO2 23  GLUCOSE 115*  BUN 23  CREATININE 1.13  CALCIUM 9.0     Intake/Output Summary (Last 24 hours) at 03/06/2021 1109 Last data filed at 03/06/2021 0500 Gross per 24 hour  Intake 460 ml  Output 950 ml  Net -490 ml        Physical Exam: Vital Signs Blood pressure 109/75, pulse 90, temperature 97.7 F (36.5 C), temperature source Oral, resp. rate 18, height 5\' 7"  (1.702 m), weight 104.3 kg, SpO2 100 %. Constitutional: No distress . Vital signs reviewed. HEENT: NCAT, EOMI, oral membranes moist Neck: supple Cardiovascular: RRR without murmur. No JVD    Respiratory/Chest: CTA Bilaterally without wheezes or rales. Normal effort    GI/Abdomen: BS +, non-tender, non-distended Ext: no clubbing, cyanosis, or edema Psych: pleasant and cooperative  Skin: left foot recently bandaged this mornng. No visible draiange MS:  left leg tender with palpation Skin: No evidence of breakdown, no evidence of rash, right foot dry, left leg dressed/vac. Vac cannister almost empty Neurologic: Cranial nerves II through XII intact, motor strength is 5/5 in Right , 3- Left deltoid, bicep, tricep, grip, hip flexor, knee extensors    Assessment/Plan: 1. Functional deficits which require 3+ hours per day of interdisciplinary therapy in a comprehensive inpatient rehab setting. Physiatrist is providing close team supervision and 24 hour management of active medical problems listed below. Physiatrist and  rehab team continue to assess barriers to discharge/monitor patient progress toward functional and medical goals  Care Tool:  Bathing    Body parts bathed by patient: Right arm, Left arm, Chest, Abdomen, Front perineal area, Right upper leg, Left upper leg, Face, Buttocks, Right lower leg   Body parts bathed by helper: Buttocks, Right lower leg Body parts n/a: Left lower leg   Bathing assist Assist Level: Contact Guard/Touching assist     Upper Body Dressing/Undressing Upper body dressing   What is the patient wearing?: Pull over shirt    Upper body assist Assist Level: Set up assist    Lower Body Dressing/Undressing Lower body dressing      What is the patient wearing?: Pants     Lower body assist Assist for lower body dressing: Minimal Assistance - Patient > 75%     Toileting Toileting    Toileting assist Assist for toileting: Minimal Assistance - Patient > 75% Assistive Device Comment: on BSC, A with clothing management   Transfers Chair/bed transfer  Transfers assist     Chair/bed transfer assist level: Contact Guard/Touching assist Chair/bed transfer assistive device: Sliding board (To R side)   Locomotion Ambulation   Ambulation assist   Ambulation activity did not occur: Safety/medical  concerns          Walk 10 feet activity   Assist  Walk 10 feet activity did not occur: Safety/medical concerns        Walk 50 feet activity   Assist Walk 50 feet with 2 turns activity did not occur: Safety/medical concerns         Walk 150 feet activity   Assist Walk 150 feet activity did not occur: Safety/medical concerns         Walk 10 feet on uneven surface  activity   Assist Walk 10 feet on uneven surfaces activity did not occur: Safety/medical concerns         Wheelchair     Assist Is the patient using a wheelchair?: Yes Type of Wheelchair: Manual    Wheelchair assist level: Set up assist, Supervision/Verbal cueing Max  wheelchair distance: >200'    Wheelchair 50 feet with 2 turns activity    Assist        Assist Level: Supervision/Verbal cueing   Wheelchair 150 feet activity     Assist      Assist Level: Supervision/Verbal cueing   Blood pressure 109/75, pulse 90, temperature 97.7 F (36.5 C), temperature source Oral, resp. rate 18, height 5\' 7"  (1.702 m), weight 104.3 kg, SpO2 100 %.  Medical Problem List and Plan: 1. 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                -patient may shower if LLE is covered             -ELOS/Goals: 12/28 supervision to min assist goals with PT, OT  -Continue CIR therapies including PT and OT. Interdisciplinary team conference today to discuss goals, barriers to discharge, and dc planning.   2.  Impaired mobility: Continue Lovenox             -antiplatelet therapy: Aspirin 81 mg daily and Plavix 25 mg daily 3. Pain: continue Oxycodone/Robaxin as needed  12/27- pain controlled  4. Mood: Zoloft 150 mg daily, trazodone 100 mg nightly             -antipsychotic agents: Latuda 20 mg daily 5. Neuropsych: This patient is capable of making decisions on his own behalf. 6. Skin/Wound Care:  vac dc'ed. Local dressing, family ed  -f/u in 2 weeks with surgery 7. Fluids/Electrolytes/Nutrition: eating fairly well  12/27 BUN down to 23. Continue to push fluids 8.  Acute blood loss anemia.  Follow-up CBC 9.  Diabetes mellitus.  Hemoglobin A1c 6.1.  Semglee 10 units nightly.  Check blood sugars before meals and at bedtime.  Diabetic teaching CBG (last 3)  Recent Labs    03/05/21 1653 03/05/21 2144 03/06/21 0618  GLUCAP 114* 155* 117*  12/27- CBGs reasonably controlled   10.  Hypertension.  Norvasc 10 mg daily.  Monitor with increased mobility Vitals:   03/05/21 2024 03/06/21 0441  BP: 132/89 109/75  Pulse: 86 90   Resp: 18 18  Temp: 97.6 F (36.4 C) 97.7 F (36.5 C)  SpO2: 100% 100%   12/27 controlled. Avoid hypotension  11.  Hyperlipidemia.  Lipitor 12.  History of CVA with residual left-sided weakness.  Continue aspirin and Plavix 13.  Morbid obesity.  BMI 37.74.  Dietary follow-up 14.  Constipation.  MiraLAX twice daily as needed.  12/27 moving bowels 15. Right shoulder stiffness: added kpad     LOS: 11 days A FACE TO FACE EVALUATION WAS PERFORMED  Ranelle Oyster 03/06/2021, 11:09 AM

## 2021-03-06 NOTE — Progress Notes (Signed)
Inpatient Rehabilitation Discharge Medication Review by a Pharmacist  A complete drug regimen review was completed for this patient to identify any potential clinically significant medication issues.  High Risk Drug Classes Is patient taking? Indication by Medication  Antipsychotic Yes Latuda - PTSD  Anticoagulant No   Antibiotic No   Opioid Yes Percocet - pain  Antiplatelet Yes ASA/plavix - CVA  Hypoglycemics/insulin Yes Lantus - DM  Vasoactive Medication Yes Amlodipine - HTN  Chemotherapy No   Other Yes Atorvastatin - HLD Robaxin - muscle spasms Sertraline - depression Trazodone- sleep     Type of Medication Issue Identified Description of Issue Recommendation(s)  Drug Interaction(s) (clinically significant)     Duplicate Therapy     Allergy     No Medication Administration End Date     Incorrect Dose     Additional Drug Therapy Needed     Significant med changes from prior encounter (inform family/care partners about these prior to discharge).    Other       Clinically significant medication issues were identified that warrant physician communication and completion of prescribed/recommended actions by midnight of the next day:  No   Pharmacist comments:   Time spent performing this drug regimen review (minutes):  30   Roshard Rezabek BS, PharmD, BCPS Clinical Pharmacist 03/06/2021 10:50 AM

## 2021-03-06 NOTE — Progress Notes (Signed)
Physical Therapy Discharge Summary  Patient Details  Name: Adam Perez MRN: [id number] Date of Birth: 1955-05-03   Patient has met 7 of 7 long term goals due to improved activity tolerance, increased strength, decreased pain, ability to compensate for deficits, and improved coordination.  Patient to discharge at a wheelchair level Modified Independent and slide board transfer level CGA.   Patient's care partner is independent to provide the necessary physical and cognitive assistance at discharge and has participated in family training. However, patient has frequently refused or disregarded education from PT and is unlikely to adhere to recommendations at home for safe transfers and reduced fall risk, which caregiver is aware of.   Recommendation:  Patient will benefit from ongoing skilled PT services in home health setting to continue to advance safe functional mobility, address ongoing impairments in global deconditioning, safety awareness, L hemiplegia and minimize fall risk.  Equipment: 18x18 WC, slide board  Reasons for discharge: treatment goals met and discharge from hospital  Patient/family agrees with progress made and goals achieved: Yes  PT Discharge Precautions/Restrictions Precautions Precautions: Fall Precaution Comments: L sided hemi/spasticity from prior CVA Required Braces or Orthoses: Other Brace Other Brace: L darco Restrictions Weight Bearing Restrictions: Yes LLE Weight Bearing: Partial weight bearing (Through L heel) LLE Partial Weight Bearing Percentage or Pounds: Through L heel w/Darco shoe Vital Signs Therapy Vitals Temp: 98.1 F (36.7 C) Temp Source: Oral Pulse Rate: 95 Resp: 18 BP: 111/77 Patient Position (if appropriate): Sitting Oxygen Therapy SpO2: 100 % O2 Device: Room Air Pain Pain Assessment Pain Scale: 0-10 Pain Score: 0-No pain Pain Interference Pain Interference Pain Effect on Sleep: 3. Frequently Pain Interference with Therapy  Activities: 2. Occasionally Pain Interference with Day-to-Day Activities: 2. Occasionally Vision/Perception  Vision - History Ability to See in Adequate Light: 0 Adequate Perception Perception: Within Functional Limits Praxis Praxis: Intact  Cognition Overall Cognitive Status: Within Functional Limits for tasks assessed Arousal/Alertness: Awake/alert Orientation Level: Oriented X4 Year: 2022 Month: December Day of Week: Correct Memory: Appears intact Immediate Memory Recall: Sock;Blue;Bed Memory Recall Sock: Without Cue Memory Recall Blue: Without Cue Memory Recall Bed: Without Cue Awareness: Appears intact Problem Solving: Appears intact Safety/Judgment: Impaired (Difficulty adhering to weightbearing restrictions, adamant about walking despite heavy educationm regarding safety concerns) Sensation Sensation Light Touch: Impaired Detail Peripheral sensation comments: 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 Hot/Cold: Appears Intact Proprioception: Impaired by gross assessment Stereognosis: Impaired by gross assessment Additional Comments: prior L hemi Coordination Gross Motor Movements are Fluid and Coordinated: No Fine Motor Movements are Fluid and Coordinated: No Coordination and Movement Description: 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 Finger Nose Finger Test: decreased speed and ROM on the L side from premorbid weakess Heel Shin Test: unable to complete on the LLE due to flexor tone and hemiplegia Motor  Motor Motor: Hemiplegia Motor - Skilled Clinical Observations: baseline L sided hemiplegia and LLE flexor tone Motor - Discharge Observations: baseline L sided hemiplegia and LLE flexor tone  Mobility Bed Mobility Bed Mobility: Supine to Sit;Rolling Right;Rolling Left;Sit to Supine Rolling Right: Independent with assistive device Rolling Left: Independent with assistive device Supine to Sit: Independent with assistive device Sit to Supine:  Independent with assistive device Transfers Transfers: Lateral/Scoot Transfers;Sit to Stand;Stand to Sit Sit to Stand: Contact Guard/Touching assist (// bars) Stand to Sit: Minimal Assistance - Patient > 75% (// bars) Lateral/Scoot Transfers: Contact Guard/Touching assist Transfer (Assistive device): Other (Comment) (Slide board) Locomotion  Gait Ambulation: No Gait  Gait: No Gait velocity: decreased Stairs / Additional Locomotion Stairs: No Architect: Yes Wheelchair Assistance: Independent with Camera operator: Right upper extremity;Right lower extremity Wheelchair Parts Management: Independent Distance: >300'  Trunk/Postural Assessment  Cervical Assessment Cervical Assessment: Exceptions to [facility] (Forward head) Thoracic Assessment Thoracic Assessment: Exceptions to [facility] (Kyphtoic w/lean to L side) Lumbar Assessment Lumbar Assessment: Exceptions to [facility] (Posterior pelvic tilt, windwswept to R side) Postural Control Postural Control: Deficits on evaluation Trunk Control: limited due to prior CVA  Balance Balance Balance Assessed: Yes Static Sitting Balance Static Sitting - Balance Support: Feet supported;Right upper extremity supported Static Sitting - Level of Assistance: 7: Independent Dynamic Sitting Balance Dynamic Sitting - Balance Support: Feet unsupported;No upper extremity supported Dynamic Sitting - Level of Assistance: 6: Modified independent (Device/Increase time) Dynamic Sitting - Balance Activities: Lateral lean/weight shifting;Forward lean/weight shifting;Trunk control activities;Reaching for objects Static Standing Balance Static Standing - Balance Support: Right upper extremity supported Static Standing - Level of Assistance: 5: Stand by assistance Dynamic Standing Balance Dynamic Standing - Level of Assistance: 4: Min assist Extremity Assessment  RLE Assessment RLE Assessment: Exceptions to [facility] RLE  Strength RLE Overall Strength: Within Functional Limits for tasks assessed Right Hip Flexion: 4/5 Right Hip Extension: 4/5 Right Hip ABduction: 4/5 Right Hip ADduction: 4/5 Right Knee Flexion: 4/5 Right Knee Extension: 4/5 Right Ankle Dorsiflexion: 4/5 Right Ankle Plantar Flexion: 4/5 LLE Assessment LLE Assessment: Exceptions to [facility] LLE Strength LLE Overall Strength: Deficits;Due to premorbid status Left Hip Flexion: 2-/5 Left Hip Extension: 2/5 Left Hip ABduction: 2-/5 Left Hip ADduction: 2+/5 Left Knee Flexion: 2+/5 Left Knee Extension: 2+/5   Valery Chance E Elleanna Melling, PT, DPT 03/06/2021, 11:29 AM

## 2021-03-06 NOTE — Progress Notes (Addendum)
Patient ID: DUAYNE BRIDEAU, male   DOB: 1955-10-05, 65 y.o.   MRN: [id number]  Met with pt and ex-wife-[name] to inform team conference reaching goals and still set to discharge tomorrow. Pt feels he is ready and can do more than team thinks he can. Discussed again with both he and annie he is not always safe with his movements and is stubborn to not ask for assist and tends to throw himself around. Deneise Lever is aware of this and will do her best with his care. The [facility] set up for follow up and equipment to be delivered prior to discharge home  3:01 PM Pt to check with friend watching his home if [facility] wide drop-arm bedside commode to home. Aware tub bench ill take a little longer due to sent order today to Cassie at [place]. He will let this worker know

## 2021-03-06 NOTE — Progress Notes (Addendum)
Occupational Therapy Discharge Summary  Patient Details  Name: Adam Perez MRN: [id number] Date of Birth: 04/13/1955   Patient has met 5 of 6 long term goals due to improved activity tolerance and ability to compensate for deficits.  Patient to discharge at [facility] S[facility] Assist level.  Patient's care partner is independent to provide the necessary physical assistance at discharge.    Pt's wife came for education with PT to learn safe mobility skills.  She did not come for OT education but pt able to direct his own care.   Reasons goals not met:  tub shower transfer goal = not applicable  tub shower transfer not worked on due to have pt having wound vac until yesterday and he continues to have significant wound care needs.  Recommended pt to NOT shower until his foot is completely healed.  Recommended he do "dry run" transfers with HHOT to prepare for future transfers.   Recommendation:  Patient will benefit from ongoing skilled OT services in home health setting to continue to advance functional skills in the area of BADL.  Equipment: wide drop arm BSC - ordered through the [employer]  Reasons for discharge: treatment goals met  Patient/family agrees with progress made and goals achieved: Yes  OT Discharge Precautions/Restrictions  Precautions Precautions: Fall Precaution Comments: L sided hemi/spasticity from prior CVA Other Brace: L darco Restrictions LLE Weight Bearing: Partial weight bearing  ADL ADL Eating: Modified independent Where Assessed-Eating: Bed level, Wheelchair Grooming: Modified independent Where Assessed-Grooming: Edge of bed Upper Body Bathing: Setup Where Assessed-Upper Body Bathing: Sitting at sink Lower Body Bathing: Minimal assistance Where Assessed-Lower Body Bathing: Sitting at sink Upper Body Dressing: Setup Where Assessed-Upper Body Dressing: Edge of bed Lower Body Dressing: Minimal assistance Where Assessed-Lower Body Dressing: Edge of  bed Toileting: Contact guard Where Assessed-Toileting: Toilet, Bedside Commode Toilet Transfer: Therapist, music Method: Engineer, water: Grab bars, Extra wide drop arm bedside commode Tub/Shower Transfer: Not assessed Social research officer, government: Not assessed Vision Baseline Vision/History: 1 Wears glasses Patient Visual Report: No change from baseline Vision Assessment?: No apparent visual deficits Perception  Perception: Within Functional Limits Praxis Praxis: Intact Cognition Overall Cognitive Status: Within Functional Limits for tasks assessed Arousal/Alertness: Awake/alert Orientation Level: Oriented X4 Year: 2022 Month: December Day of Week: Correct Memory: Appears intact Immediate Memory Recall: Sock;Blue;Bed Memory Recall Sock: Without Cue Memory Recall Blue: Without Cue Memory Recall Bed: Without Cue Awareness: Appears intact Problem Solving: Appears intact Sensation Sensation Light Touch: Impaired Detail Peripheral sensation comments: 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 Hot/Cold: Appears Intact Proprioception: Impaired by gross assessment Stereognosis: Impaired by gross assessment Additional Comments: prior L hemi Coordination Gross Motor Movements are Fluid and Coordinated: No Fine Motor Movements are Fluid and Coordinated: No Coordination and Movement Description: 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 Finger Nose Finger Test: decreased speed and ROM on the L side from premorbid weakess Heel Shin Test: unable to complete on the LLE due to flexor tone and hemiplegia Motor  Motor Motor: Hemiplegia Motor - Skilled Clinical Observations: baseline L sided hemiplegia and LLE flexor tone Mobility  Transfers Sit to Stand: Contact Guard/Touching assist Stand to Sit: Minimal Assistance - Patient > 75%;Contact Guard/Touching assist  Trunk/Postural Assessment  Postural Control Postural Control: Deficits on  evaluation Trunk Control: limited due to prior CVA  Balance Static Sitting Balance Static Sitting - Level of Assistance: 7: Independent Dynamic Sitting Balance Dynamic Sitting - Level of Assistance: 6:  Modified independent (Device/Increase time) Static Standing Balance Static Standing - Level of Assistance: 5: Stand by assistance Dynamic Standing Balance Dynamic Standing - Level of Assistance: 4: Min assist Extremity/Trunk Assessment RUE Assessment RUE Assessment: Within Functional Limits LUE Assessment Active Range of Motion (AROM) Comments: >90 degrees in shoulder flexion General Strength Comments: 4-/5 in shoulder flexion Brunstrum level for arm: Stage III Synergy is performed voluntarily Brunstrum level for hand: Stage IV Movements deviating from synergies   Adam Perez 03/06/2021, 9:07 AM

## 2021-03-06 NOTE — Progress Notes (Signed)
Inpatient Rehabilitation Care Coordinator Discharge Note   Patient Details  Name: Adam Perez MRN: [id number] Date of Birth: Oct 15, 1955   Discharge location: HOME WITH EX-WIFE-[name] ASSIST HIM-24/7  Length of Stay:  12 DAYS  Discharge activity level: CGA-MIN TRANSFERS-WHEELCHAIR LEVEL  Home/community participation: ACTIVE  Patient response WU:[id number] Literacy - How often do you need to have someone help you when you read instructions, pamphlets, or other written material from your doctor or pharmacy?: Never  Patient response YN:WGNFAO Isolation - How often do you feel lonely or isolated from those around you?: Never  Services provided included: MD, RD, PT, OT, RN, CM, TR, Pharmacy, SW  Financial Services:  Financial Services Utilized: Medicare    Choices offered to/list presented to:    Follow-up services arranged:  Home Health, DME, Patient/Family has no preference for HH/DME agencies Home Health Agency: [facility]-PT, OT, RN    DME : [facility]-WHEELCHAIR AND TRANSFER BOARD [facility]-WIDE DROP-ARM BEDSIDE COMMODE AND TUB BENCH    Patient response to transportation need: Is the patient able to respond to transportation needs?: Yes In the past 12 months, has lack of transportation kept you from medical appointments or from getting medications?: No In the past 12 months, has lack of transportation kept you from meetings, work, or from getting things needed for daily living?: No    Comments (or additional information): [name] WAS HERE FOR FAMILY EDUCATION AND IS AWARE OF HOW STUBBORN PT IS AND NOT ALWAYS SAFE WITH HIS MOVEMENTS. SHE HAS FMLA PAPERS COMPLETED AND WILL DO HER BEST ASSISTING  Patient/Family verbalized understanding of follow-up arrangements:  Yes  Individual responsible for coordination of the follow-up plan: [name]-EX-WIFE [phone]  Confirmed correct DME delivered: Lucy Chris 03/06/2021    Myda Detwiler, Lemar Livings

## 2021-03-06 NOTE — Patient Care Conference (Signed)
Inpatient RehabilitationTeam Conference and Plan of Care Update Date: 03/06/2021   Time: 12:04 PM    Patient Name: Adam Perez      Medical Record Number: [id number]  Date of Birth: Sep 14, 1955 Sex: Male         Room/Bed: 4M05C/4M05C-01 Payor Info: Payor: MEDICARE / Plan: MEDICARE PART A AND B / Product Type: *No Product type* /    Admit Date/Time:  02/23/2021  4:15 PM  Primary Diagnosis:  History of transmetatarsal amputation of left foot [facility])  Hospital Problems: Principal Problem:   History of transmetatarsal amputation of left foot [facility])    Expected Discharge Date: Expected Discharge Date: 03/07/21  Team Members Present: Physician leading conference: Dr. Alger Simons Social Worker Present: Ovidio Kin, LCSW Nurse Present: Dorien Chihuahua, RN PT Present: Francena Hanly, PT OT Present: Meriel Pica, OT PPS Coordinator present : Ileana Ladd, PT     Current Status/Progress Goal Weekly Team Focus  Bowel/Bladder   Pt is continent of Bowels and Bladder  Continue current use of BSC,urinal  offer toileting mutliple times throughout shift and PRN   Swallow/Nutrition/ Hydration             ADL's   goals have been met. pt is CGA with self care and transfers to [facility] (did not do shower transfers due to wound care, will defer to [facility])  CGA bathing, standing and toileting; min A toilet and shower transfer  pt ready for discharge home tomorrow. Pt education only, no family ed   Mobility   S* WC mobility >200' hemi-technique, CGA-min A SB  CGA transfers w/SB, mod I WC  Pt/Fam edu, transfers, safety awareness   Communication             Safety/Cognition/ Behavioral Observations            Pain             Skin   Pt has staples on L foot from amputation.  Wound vac D/C on 03/05/21  Promote healing  assess skin qshift & PRN, Wound care M/W/F     Discharge Planning:  Home with ex-wife who can be there as long as FMLA paperwork completed. Will need education prior to DC due  to pt will require assist   Team Discussion: Patient is stubborn and set in his ways, also note impulsivity and poor safety awareness. Wife provided education on dressing changes/incision care. Reinforced use of Darco shoe and WB precautions. Patient on target to meet rehab goals: Yes; on target to meet close guard goals.  *See Care Plan and progress notes for long and short-term goals.   Revisions to Treatment Plan:  N/A   Teaching Needs: Incision care, safety, transfers, medications, etc.  Current Barriers to Discharge: Decreased caregiver support, Weight bearing restrictions, and Behavior  Possible Resolutions to Barriers: Family education HH follow up services     Medical Summary Current Status: pain improving. vac out. needsfamily ed. bp controlled  Barriers to Discharge: Medical stability   Possible Resolutions to [employer] Focus: daily assessment of labs and patient data   Continued Need for Acute Rehabilitation Level of Care: The patient requires daily medical management by a physician with specialized training in physical medicine and rehabilitation for the following reasons: Direction of a multidisciplinary physical rehabilitation program to maximize functional independence : Yes Medical management of patient stability for increased activity during participation in an intensive rehabilitation regime.: Yes Analysis of laboratory values and/or radiology reports with any subsequent need for  medication adjustment and/or medical intervention. : Yes   I attest that I was present, lead the team conference, and concur with the assessment and plan of the team.   Dorien Chihuahua B 03/06/2021, 12:36 PM

## 2021-03-06 NOTE — Progress Notes (Signed)
Occupational Therapy Session Note  Patient Details  Name: Adam Perez MRN: [id number] Date of Birth: March 02, 1956  Today's Date: 03/06/2021 OT Individual Time: 8299-3716 OT Individual Time Calculation (min): 45 min    Short Term Goals: Week 2:  OT Short Term Goal 1 (Week 2): STGs = LTGs  Skilled Therapeutic Interventions/Progress Updates:    Pt received in wc and eager to get to toilet. Pt positioned next to wide drop arm BSC, due to flat surface of commode, slide board not needed.  Pt able to transfer to [facility] with CGA.  He reverted back to his old technique of leaning into transfer and did not use methods of forward wt shift for head to be in opposition of hips for and even smoother transfer. Tried to cue pt but he was so anxious to get to toilet he was not listening to cues. Once on toilet, completed toileting with CGA to get pants down. Able to cleanse and redon pants without A.  Transferred to sink to finish bathing.  UB completed independently from wc level.  LB CGA when standing on R leg only to manage clothing and wash his bottom.  Pt participated well and did very well with safe sit to stands.  Pt resting in wc. PT arrived to room for his next session.  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 Required Braces or Orthoses: Other Brace Other Brace: L darco Restrictions Weight Bearing Restrictions: Yes LLE Weight Bearing: Partial weight bearing LLE Partial Weight Bearing Percentage or Pounds:  (with darco shoe)    Vital Signs: Therapy Vitals Temp: 97.7 F (36.5 C) Temp Source: Oral Pulse Rate: 90 Resp: 18 BP: 109/75 Patient Position (if appropriate): Lying Oxygen Therapy SpO2: 100 % Pain: Pain Assessment Pain Scale: 0-10 Pain Score: 0-No pain ADL: ADL Eating: Modified independent Where Assessed-Eating: Bed level, Wheelchair Grooming: Modified independent Where  Assessed-Grooming: Edge of bed Upper Body Bathing: Supervision/safety Where Assessed-Upper Body Bathing: Edge of bed Lower Body Bathing: Minimal assistance Where Assessed-Lower Body Bathing: Edge of bed Upper Body Dressing: Supervision/safety Where Assessed-Upper Body Dressing: Edge of bed Lower Body Dressing: Minimal assistance Where Assessed-Lower Body Dressing: Edge of bed Toileting: Minimal assistance Where Assessed-Toileting: Toilet, Psychiatrist Transfer: Minimal Dentist Method: Scientist, research (life sciences): Grab bars, Extra wide drop arm bedside commode Tub/Shower Transfer: Not assessed Film/video editor: Not assessed        Therapy/Group: Individual Therapy  Jama Mcmiller 03/06/2021, 8:26 AM

## 2021-03-06 NOTE — Progress Notes (Signed)
Physical Therapy Session Note  Patient Details  Name: Adam Perez MRN: [id number] Date of Birth: 1955/12/07  Today's Date: 03/06/2021 PT Individual Time: 0920-1030; 1330-1430 PT Individual Time Calculation (min): 70 min and 60 min  Short Term Goals: Week 1:  PT Short Term Goal 1 (Week 1): Pt will trasnfer to and from bed with min assist consistently with SB PT Short Term Goal 1 - Progress (Week 1): Met PT Short Term Goal 2 (Week 1): Pt will perform WC mobiltiy with supervision assist PT Short Term Goal 2 - Progress (Week 1): Met PT Short Term Goal 3 (Week 1): Pt will ambulate 37f with max assist and LRAD PT Short Term Goal 3 - Progress (Week 1): Discontinued (comment) (Pt not a functional ambulator, [facility] Week 2:  PT Short Term Goal 1 (Week 2): STG = LTG due to LOS  Skilled Therapeutic Interventions/Progress Updates:  Session 1 Pt received seated in WC in room, handoff w/OT. Pt reported 3/10 pain in L foot and was premedicated. Offered repositioning and distraction throughout session for pain modulation. Emphasis of session on sit <>stand transfers, BLE strength and safety awareness. Pt self-propelled >150' from room to main gym using hemi-technique mod I, pt demonstrated ability to manage brakes and leg rests independently.   In // bars for BLE strength, midline orientation and sit <>stand practice:  -Pt performed 5 sit <>stands w/CGA [facility]. Pt still demonstrates R trunk lean but it is improved, lengthy discussion regarding it being unsafe for pt to attempt to stand alone at home. Pt reported we kept him locked up day and night and that is why I cannot stand. Re-educated pt on CIR safety policies and reasons why pt is unsafe to stand alone. Pt frequently interrupted therapist and reported he will be able to walk when he goes home tomorrow. Informed pt that walking is unsafe at this time due to his pain, difficulty maintaining weightbearing  restrictions and global deconditioning, but pt verbalized disagreement.  -In standing, pt able to hold for 3-5 minutes at a time w/BUE support on rails. Noted mild L trunk lean, pelvic lean to R and maintained knee flexion of LLE w/sustained clonus. Pt repeatedly attempting to stand without BUE support and began to have posterior lean, requiring mi-mod A for correction. Educated pt on safety risks with letting go of support w/BUEs when he is unbalanced, pt reported that therapist does not like taking risks. Pt repeatedly requesting to remove Darco shoe so he can stand evenly. Informed pt that the Darco shoe is needed to protect his foot due to difficulty maintaining weightbearing restrictions in stance.  -Alt. quad sets in standing and sitting on LLE for improved knee extension in stance, 6x25 w/3s isometric hold. Pt initially lacking 25 degrees of knee extension of LLE and by end of session, noted lacking 15 degrees. Educated pt on performing LAQs w/3s isometric while sitting in WC and in bed for improved quad strength to promote knee extension.   The following exercises were completed while seated in WC for BLE strength:  -Seated marches, 1x35 and 2x15. Noted significant decrease in quality of movement past 15 reps, but pt refused to stop during first set. Lengthy discussion regarding long term benefits of progressive overload and strength, as pt wants to push it to the limit and quality of movement significantly deteriorates. Pt reported we push it in the mDozier -Banded hip abduction w/green theraband, 2x15   Pt  self-propelled >150' back to room mod I using hemi-technique and was left seated in WC in room, all needs in reach.   Session 2  Pt received seated in WC in room, reported 4/10 pain in L ankle and was premedicated. Offered repositioning and distraction throughout session for pain modulation. Emphasis of session on preparation for DC, WC management and transfers. Pt disgruntled when  therapist entered room and stated he has been strapped in this chair all day. Requested pt demonstrate bed mobility and pt attempted to perform lateral scoot from [facility] to bed without board, noted significant gap between chair and bed. Informed pt it would be safer to use board, in which pt replied it was easier without it. Lengthy conversation regarding safety concerns and recommendations for using equipment at home to reduce fall risk, in which pt replied well you are the doctor I guess. Pt performed lateral board transfer from [facility] to EOB on R side w/CGA and demonstrated bed mobility mod I w/bedrail. Pt then performed lateral board transfer to R side from EOB to [facility] w/CGA and self-propelled >500' from room to gift shop and back mod I for improved activity tolerance. Pt practiced self-propulsion in gift shop to manage navigating tight corners and obstacles in [facility] community reintegration mod I, noted good awareness of surroundings and navigation of sharp turns.   In // bars, pt practiced sit <>stands x3 w/CGA and held stand for 2-4 minutes w/RUE support for improved LLE weightbearing tolerance, midline orientation and BLE strength per pt request. Noted improved L knee extension but still lacking ~20 degrees of extension. Mod verbal and tactile cues for L quad activation to promote knee extension. Stand <>sits w/CGA as pt demonstrated good technique and eccentric control.  Pt self-propelled >150' back to room mod I using hemi-technique and was left seated in WC in room, all needs in reach.    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

## 2021-03-06 NOTE — Plan of Care (Signed)
Problem: RH Balance Goal: LTG Patient will maintain dynamic sitting balance (PT) Description: LTG:  Patient will maintain dynamic sitting balance with assistance during mobility activities (PT) Outcome: Completed/Met Goal: LTG Patient will maintain dynamic standing balance (PT) Description: LTG:  Patient will maintain dynamic standing balance with assistance during mobility activities (PT) Outcome: Completed/Met   Problem: RH Bed Mobility Goal: LTG Patient will perform bed mobility with assist (PT) Description: LTG: Patient will perform bed mobility with assistance, with/without cues (PT). Outcome: Completed/Met   Problem: RH Bed to Chair Transfers Goal: LTG Patient will perform bed/chair transfers w/assist (PT) Description: LTG: Patient will perform bed to chair transfers with assistance (PT). Outcome: Completed/Met   Problem: RH Car Transfers Goal: LTG Patient will perform car transfers with assist (PT) Description: LTG: Patient will perform car transfers with assistance (PT). Outcome: Completed/Met   Problem: RH Wheelchair Mobility Goal: LTG Patient will propel w/c in controlled environment (PT) Description: LTG: Patient will propel wheelchair in controlled environment, # of feet with assist (PT) Outcome: Completed/Met

## 2021-03-07 DIAGNOSIS — G8918 Other acute postprocedural pain: Secondary | ICD-10-CM

## 2021-03-07 LAB — GLUCOSE, CAPILLARY: Glucose-Capillary: 114 mg/dL — ABNORMAL HIGH (ref 70–99)

## 2021-03-07 NOTE — Plan of Care (Signed)
All goals met. Discharged at this time in care of family.

## 2021-03-07 NOTE — Consult Note (Signed)
WOC Nurse wound follow up Requested by Bedside nurse for assistance with teaching family member how to wrap the patients leg.  Patient had started bleeding at some point since I saw him on Monday and the dressing was reinforced. Dried blood all over the dressing. Used NS to assist with removal of dressing. The LLE medial wound is looking very good. 75% of the yellow slough has come off with good granulation tissue remaining. Applied a generous coat of Santyl to the remaining yellow slough followed by a moistened saline gauze and 2 ABD pads. Place a piece of petrolatum based gauze over the amputation surgical site on the foot then wrapped from the tip of the foot to just below the knee with Kerlix and then wrapped with Coban. The family member is to change this dressing on Friday and Sunday. He has a FU appt with [facility] on Tues 03/13/21 at that time the dressing will be removed and re-evaluated. The Santyl should be ready to DC at that time. Sent patient home with supplies to last until appt.   Renaldo Reel Katrinka Blazing, MSN, RN, CMSRN, Angus Seller, [facility] Wound Treatment Associate Pager [phone]

## 2021-03-07 NOTE — Progress Notes (Signed)
PROGRESS NOTE   Subjective/Complaints: Slept well. Thankful of team for their efforts.   ROS: Patient denies fever, rash, sore throat, blurred vision, nausea, vomiting, diarrhea, cough, shortness of breath or chest pain, headache, or mood change.    Objective:   No results found. Recent Labs    03/05/21 0659  WBC 8.3  HGB 9.2*  HCT 27.2*  PLT 420*    Recent Labs    03/05/21 0659  NA 137  K 4.2  CL 103  CO2 23  GLUCOSE 115*  BUN 23  CREATININE 1.13  CALCIUM 9.0     Intake/Output Summary (Last 24 hours) at 03/07/2021 0816 Last data filed at 03/07/2021 0419 Gross per 24 hour  Intake 560 ml  Output 1150 ml  Net -590 ml        Physical Exam: Vital Signs Blood pressure 109/70, pulse 94, temperature 97.8 F (36.6 C), resp. rate 18, height 5\' 7"  (1.702 m), weight 104.3 kg, SpO2 100 %. Constitutional: No distress . Vital signs reviewed. HEENT: NCAT, EOMI, oral membranes moist Neck: supple Cardiovascular: RRR without murmur. No JVD    Respiratory/Chest: CTA Bilaterally without wheezes or rales. Normal effort    GI/Abdomen: BS +, non-tender, non-distended Ext: no clubbing, cyanosis, or edema Psych: pleasant and cooperative \ Skin: left foot dressed MS:  left leg remains sl tender Neurologic: Cranial nerves II through XII intact, motor strength is 5/5 in Right , 3- Left deltoid, bicep, tricep, grip, hip flexor, knee extensors    Assessment/Plan: 1. Functional deficits which require 3+ hours per day of interdisciplinary therapy in a comprehensive inpatient rehab setting. Physiatrist is providing close team supervision and 24 hour management of active medical problems listed below. Physiatrist and rehab team continue to assess barriers to discharge/monitor patient progress toward functional and medical goals  Care Tool:  Bathing    Body parts bathed by patient: Right arm, Left arm, Chest, Abdomen, Front  perineal area, Right upper leg, Left upper leg, Face, Buttocks, Right lower leg   Body parts bathed by helper: Buttocks, Right lower leg Body parts n/a: Left lower leg   Bathing assist Assist Level: Contact Guard/Touching assist     Upper Body Dressing/Undressing Upper body dressing   What is the patient wearing?: Pull over shirt    Upper body assist Assist Level: Independent    Lower Body Dressing/Undressing Lower body dressing      What is the patient wearing?: Pants     Lower body assist Assist for lower body dressing: Contact Guard/Touching assist     Toileting Toileting    Toileting assist Assist for toileting: Supervision/Verbal cueing Assistive Device Comment: on BSC, A with clothing management   Transfers Chair/bed transfer  Transfers assist     Chair/bed transfer assist level: Contact Guard/Touching assist (Slide board) Chair/bed transfer assistive device: Sliding board (to R side)   Locomotion Ambulation   Ambulation assist   Ambulation activity did not occur: Safety/medical concerns (Poor adherence to weightbearing restrictions, L hemiplegia)          Walk 10 feet activity   Assist  Walk 10 feet activity did not occur: Safety/medical concerns (Poor adherence to weightbearing restrictions,  L hemiplegia)        Walk 50 feet activity   Assist Walk 50 feet with 2 turns activity did not occur: Safety/medical concerns (Poor adherence to weightbearing restrictions, L hemiplegia)         Walk 150 feet activity   Assist Walk 150 feet activity did not occur: Safety/medical concerns (Poor adherence to weightbearing restrictions, L hemiplegia)         Walk 10 feet on uneven surface  activity   Assist Walk 10 feet on uneven surfaces activity did not occur: Safety/medical concerns (Poor adherence to weightbearing restrictions, L hemiplegia)         Wheelchair     Assist Is the patient using a wheelchair?: Yes Type of  Wheelchair: Manual    Wheelchair assist level: Independent Max wheelchair distance: >200'    Wheelchair 50 feet with 2 turns activity    Assist        Assist Level: Independent   Wheelchair 150 feet activity     Assist      Assist Level: Independent   Blood pressure 109/70, pulse 94, temperature 97.8 F (36.6 C), resp. rate 18, height 5\' 7"  (1.702 m), weight 104.3 kg, SpO2 100 %.  Medical Problem List and Plan: 1. 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                -dc home today. F/u w/ surgery/pcp.    -discussed with pt that he needs to continue with techniques and strategies that he's worked on with therapies. 2.  Impaired mobility: Continue Lovenox             -antiplatelet therapy: Aspirin 81 mg daily and Plavix 25 mg daily 3. Pain: continue Oxycodone/Robaxin as needed  12/28- pain controlled  4. Mood: Zoloft 150 mg daily, trazodone 100 mg nightly             -antipsychotic agents: Latuda 20 mg daily 5. Neuropsych: This patient is capable of making decisions on his own behalf. 6. Skin/Wound Care:  vac dc'ed. Local dressing, family ed  -f/u in 2 weeks with surgery 7. Fluids/Electrolytes/Nutrition: eating fairly well  Needs to push fluids at home 8.  Acute blood loss anemia.  Follow-up CBC 9.  Diabetes mellitus.  Hemoglobin A1c 6.1.  Semglee 10 units nightly.  Check blood sugars before meals and at bedtime.  Diabetic teaching CBG (last 3)  Recent Labs    03/06/21 1130 03/06/21 2058 03/07/21 0533  GLUCAP 112* 118* 114*  12/28- CBGs reasonably controlled   10.  Hypertension.  Norvasc 10 mg daily.  Monitor with increased mobility Vitals:   03/06/21 1832 03/07/21 0420  BP: 113/87 109/70  Pulse: 93 94  Resp: 17 18  Temp: 98.2 F (36.8 C) 97.8 F (36.6 C)  SpO2: 100% 100%   12/28 controlled. Avoid  hypotension  11.  Hyperlipidemia.  Lipitor 12.  History of CVA with residual left-sided weakness.  Continue aspirin and Plavix 13.  Morbid obesity.  BMI 37.74.  Dietary follow-up 14.  Constipation.  MiraLAX twice daily as needed.              12/27 moving bowels 15. Right shoulder stiffness: added kpad     LOS: 12 days A FACE TO FACE EVALUATION WAS PERFORMED  1/28 03/07/2021, 8:16 AM

## 2021-03-07 NOTE — Progress Notes (Signed)
Patient rested well all night. No c/o pain or discomfort.

## 2021-03-07 NOTE — Plan of Care (Signed)
Problem: RH Wheelchair Mobility Goal: LTG Patient will propel w/c in home environment (PT) Description: LTG: Patient will propel wheelchair in home environment, # of feet with assistance (PT). Outcome: Completed/Met

## 2021-03-08 ENCOUNTER — Telehealth: Payer: Self-pay

## 2021-03-08 NOTE — Telephone Encounter (Signed)
Transitional Care Call--who you spoke with Adam Perez (wife).   Are you/is patient experiencing any problems since coming home? No.  Are there any questions regarding any aspect of care? No. Are there any questions regarding medications administration/dosing?No. Are meds being taken as prescribed?Yes. Patient should review meds with caller to confirm. Medications confirmed.  Have there been any falls? No. Has Home Health been to the house and/or have they contacted you? Yes. If not, have you tried to contact them? No need. Can we help you contact them? N/a Are bowels and bladder emptying properly? Yes. Are there any unexpected incontinence issues? No problem.  If applicable, is patient following bowel/bladder programs? N/a Any fevers, problems with breathing, unexpected pain? No problem. Are there any skin problems or new areas of breakdown? No.  Has the patient/family member arranged specialty MD follow up (ie cardiology/neurology/renal/surgical/etc)?  Appointments arranged.  Can we help arrange? N/a Does the patient need any other services or support that we can help arrange? No. Are caregivers following through as expected in assisting the patient? Yes.         11. Has the patient quit smoking, drinking alcohol, or using drugs as recommended? Patient does not indulge.   Appointment Date/Time/ Arrival time/ and who they are seeing Jacalyn Lefevre NP on 03/20/2021 @ 9:00 [address] [employer] [address]

## 2021-03-08 NOTE — Telephone Encounter (Signed)
ERROR

## 2021-03-09 ENCOUNTER — Telehealth: Payer: Self-pay | Admitting: *Deleted

## 2021-03-09 ENCOUNTER — Telehealth: Payer: Self-pay

## 2021-03-09 ENCOUNTER — Other Ambulatory Visit: Payer: Self-pay

## 2021-03-09 ENCOUNTER — Encounter (HOSPITAL_COMMUNITY): Payer: Self-pay

## 2021-03-09 ENCOUNTER — Emergency Department (HOSPITAL_COMMUNITY)
Admission: EM | Admit: 2021-03-09 | Discharge: 2021-03-09 | Disposition: A | Discharge status: Home / Self Care | Payer: Medicare Other | Attending: Emergency Medicine | Admitting: Emergency Medicine

## 2021-03-09 DIAGNOSIS — E1151 Type 2 diabetes mellitus with diabetic peripheral angiopathy without gangrene: Secondary | ICD-10-CM | POA: Diagnosis not present

## 2021-03-09 DIAGNOSIS — I10 Essential (primary) hypertension: Secondary | ICD-10-CM | POA: Diagnosis not present

## 2021-03-09 DIAGNOSIS — L03116 Cellulitis of left lower limb: Secondary | ICD-10-CM | POA: Insufficient documentation

## 2021-03-09 DIAGNOSIS — Z79899 Other long term (current) drug therapy: Secondary | ICD-10-CM | POA: Diagnosis not present

## 2021-03-09 DIAGNOSIS — Z7982 Long term (current) use of aspirin: Secondary | ICD-10-CM | POA: Diagnosis not present

## 2021-03-09 DIAGNOSIS — T8789 Other complications of amputation stump: Secondary | ICD-10-CM | POA: Diagnosis not present

## 2021-03-09 DIAGNOSIS — Y69 Unspecified misadventure during surgical and medical care: Secondary | ICD-10-CM | POA: Insufficient documentation

## 2021-03-09 DIAGNOSIS — Z7902 Long term (current) use of antithrombotics/antiplatelets: Secondary | ICD-10-CM | POA: Insufficient documentation

## 2021-03-09 DIAGNOSIS — Z794 Long term (current) use of insulin: Secondary | ICD-10-CM | POA: Insufficient documentation

## 2021-03-09 DIAGNOSIS — T8149XA Infection following a procedure, other surgical site, initial encounter: Secondary | ICD-10-CM

## 2021-03-09 LAB — CBC WITH DIFFERENTIAL/PLATELET
Abs Immature Granulocytes: 0.07 10*3/uL (ref 0.00–0.07)
Basophils Absolute: 0.1 10*3/uL (ref 0.0–0.1)
Basophils Relative: 1 %
Eosinophils Absolute: 0.1 10*3/uL (ref 0.0–0.5)
Eosinophils Relative: 1 %
HCT: 27.1 % — ABNORMAL LOW (ref 39.0–52.0)
Hemoglobin: 8.7 g/dL — ABNORMAL LOW (ref 13.0–17.0)
Immature Granulocytes: 1 %
Lymphocytes Relative: 17 %
Lymphs Abs: 1.7 10*3/uL (ref 0.7–4.0)
MCH: 28.2 pg (ref 26.0–34.0)
MCHC: 32.1 g/dL (ref 30.0–36.0)
MCV: 87.7 fL (ref 80.0–100.0)
Monocytes Absolute: 0.9 10*3/uL (ref 0.1–1.0)
Monocytes Relative: 9 %
Neutro Abs: 7.3 10*3/uL (ref 1.7–7.7)
Neutrophils Relative %: 71 %
Platelets: 514 10*3/uL — ABNORMAL HIGH (ref 150–400)
RBC: 3.09 MIL/uL — ABNORMAL LOW (ref 4.22–5.81)
RDW: 16 % — ABNORMAL HIGH (ref 11.5–15.5)
WBC: 10.2 10*3/uL (ref 4.0–10.5)
nRBC: 0 % (ref 0.0–0.2)

## 2021-03-09 LAB — COMPREHENSIVE METABOLIC PANEL
ALT: 49 U/L — ABNORMAL HIGH (ref 0–44)
AST: 25 U/L (ref 15–41)
Albumin: 3.6 g/dL (ref 3.5–5.0)
Alkaline Phosphatase: 87 U/L (ref 38–126)
Anion gap: 10 (ref 5–15)
BUN: 32 mg/dL — ABNORMAL HIGH (ref 8–23)
CO2: 23 mmol/L (ref 22–32)
Calcium: 9.2 mg/dL (ref 8.9–10.3)
Chloride: 102 mmol/L (ref 98–111)
Creatinine, Ser: 1.45 mg/dL — ABNORMAL HIGH (ref 0.61–1.24)
GFR, Estimated: 53 mL/min — ABNORMAL LOW (ref >60)
Glucose, Bld: 123 mg/dL — ABNORMAL HIGH (ref 70–99)
Potassium: 4.1 mmol/L (ref 3.5–5.1)
Sodium: 135 mmol/L (ref 135–145)
Total Bilirubin: 0.2 mg/dL — ABNORMAL LOW (ref 0.3–1.2)
Total Protein: 7.8 g/dL (ref 6.5–8.1)

## 2021-03-09 LAB — LACTIC ACID, PLASMA: Lactic Acid, Venous: 1.6 mmol/L (ref 0.5–1.9)

## 2021-03-09 MED ORDER — BACITRACIN ZINC 500 UNIT/GM EX OINT
1.0000 "application " | TOPICAL_OINTMENT | Freq: Two times a day (BID) | CUTANEOUS | Status: DC
  Filled 2021-03-09: qty 1.8
  Filled 2021-03-09: qty 0.9

## 2021-03-09 MED ORDER — DOXYCYCLINE HYCLATE 100 MG PO CAPS: 100.0000 mg | ORAL_CAPSULE | Freq: Two times a day (BID) | ORAL | 0 refills | Status: AC

## 2021-03-09 MED ORDER — DOXYCYCLINE HYCLATE 100 MG PO TABS
100.0000 mg | ORAL_TABLET | Freq: Once | ORAL | Status: AC
  Administered 2021-03-09: 20:00:00 100 mg via ORAL
  Filled 2021-03-09: qty 1

## 2021-03-09 NOTE — Telephone Encounter (Signed)
Needs to be seen by PCP or urgent care- not wait to Monday- thanks- ML

## 2021-03-09 NOTE — ED Notes (Signed)
Cleansed and dressed wound with ointment, Vaseline Gauze, and non-stick. Wrapped.  Gave abx tablet and d/c papers. All questions answered. Wheeled to lobby with family member.

## 2021-03-09 NOTE — Telephone Encounter (Signed)
Patient's wife calls today after the nurse was out to dress the wound. He is s/p TMA at the beginning of December. Nurse said there was an odor from the wound and needed antibiotics. Denies fever and chills or purulent drainage. Advised patient had appointment on Tuesday and we could not prescribe antibiotics without seeing him. They are going to go to [facility].

## 2021-03-09 NOTE — ED Triage Notes (Signed)
Pt presents to ED with complaints of possible infection to left foot. Home health nurse thought foot had an odor to it today. Denies fever or increased pain.

## 2021-03-09 NOTE — ED Provider Notes (Signed)
[facility] EMERGENCY DEPARTMENT Provider Note   CSN: [id number] Arrival date & time: 03/09/21  1702     History No chief complaint on file.   Adam Perez is a 64 y.o. male.  HPI  This patient is a 65 year old male, he is a diabetic, he has some hemiparesis from a prior stroke, he has hypertension and was recently admitted to the hospital where he was treated with a partial amputation of his foot secondary to ischemic digits.  He is also known to have a poorly healing wound on the back aspect posterior lateral of his right lower extremity below the knee.  He in fact had a wound care wound vacuum on this while he was in the hospital, he was on antibiotics at all time, he was discharged in good condition 2 days ago but when the home health nurse came by the house today they noticed that there was possibly a smell coming from the wound on his leg and wanted him to be seen in the emergency department after they recommended that he be seen by vascular.  The vascular office referred him here for evaluation.   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   Traumatic brain injury with loss of consciousness (HCC) 02/15/2021   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)    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)    PAD (peripheral artery disease) (HCC)    Infected wound 02/14/2021   Hyperglycemia 07/06/2019   History of ischemic  cerebrovascular accident (CVA) with residual deficit 07/06/2019   Diabetes mellitus type 2 in obese (HCC) 07/06/2019   DKA (diabetic ketoacidoses) 07/05/2019   Hemiparesis due to old brainstem infarction (HCC) 05/13/2016   Spastic hemiparesis due to cerebral infarction 03/08/2016   Essential hypertension 03/08/2016   Impaired mobility and ADLs 03/08/2016    Past Surgical History:  Procedure Laterality Date   ANGIOPLASTY Left 02/19/2021   Procedure: POPLITEAL ANGIOPLASTY;  Surgeon: Leonie Douglas, MD;  Location: [facility] OR;  Service: Vascular;  Laterality: Left;   LOWER EXTREMITY ANGIOGRAM Left 02/19/2021   Procedure: LEFT LOWER EXTREMITY ANGIOGRAM;  Surgeon: Leonie Douglas, MD;  Location: [facility] OR;  Service: Vascular;  Laterality: Left;   LOWER EXTREMITY ANGIOGRAPHY N/A 02/15/2021   Procedure: LOWER EXTREMITY ANGIOGRAPHY;  Surgeon: Leonie Douglas, MD;  Location: [facility] INVASIVE CV LAB;  Service: Cardiovascular;  Laterality: N/A;   TRANSMETATARSAL AMPUTATION Left 02/19/2021   Procedure: LEFT TRANSMETATARSAL AMPUTATION;  Surgeon: Leonie Douglas, MD;  Location: [facility] OR;  Service: Vascular;  Laterality: Left;   ULTRASOUND GUIDANCE FOR VASCULAR ACCESS Right 02/19/2021   Procedure: ULTRASOUND GUIDANCE FOR RIGHT COMMON FEMORAL ARTERY ACCESS;  Surgeon: Leonie Douglas, MD;  Location: [facility] OR;  Service: Vascular;  Laterality: Right;   WOUND DEBRIDEMENT Left 02/19/2021   Procedure: LEFT CALF VENOUS ULCER SHARP EXCISIONAL DEBRIDEMENT;  Surgeon: Leonie Douglas, MD;  Location: [facility] OR;  Service: Vascular;  Laterality: Left;  Family History  Problem Relation Age of Onset   Diabetes Brother    Diabetes Brother     Social History   Tobacco Use   Smoking status: Never   Smokeless tobacco: Never  Substance Use Topics   Alcohol use: No   Drug use: Never    Home Medications Prior to Admission medications   Medication Sig Start Date End Date Taking? Authorizing Provider  acetaminophen (TYLENOL)  325 MG tablet Take 2 tablets (650 mg total) by mouth every 6 (six) hours as needed for mild pain (or Fever >/= 101). 07/06/19   Emokpae, Courage, MD  amLODipine (NORVASC) 10 MG tablet Take 1 tablet (10 mg total) by mouth daily. 03/06/21   Angiulli, Mcarthur Rossetti, PA-C  aspirin EC 81 MG tablet Take 1 tablet (81 mg total) by mouth daily with breakfast. 07/06/19   Shon Hale, MD  atorvastatin (LIPITOR) 40 MG tablet Take 1 tablet (40 mg total) by mouth daily. 03/06/21   Angiulli, Mcarthur Rossetti, PA-C  clopidogrel (PLAVIX) 75 MG tablet Take 1 tablet (75 mg total) by mouth daily. 03/06/21   Angiulli, Mcarthur Rossetti, PA-C  insulin glargine (LANTUS) 100 UNIT/ML Solostar Pen Inject 10 Units into the skin at bedtime. 03/06/21   Angiulli, Mcarthur Rossetti, PA-C  insulin glargine (LANTUS) 100 UNIT/ML Solostar Pen Inject 10 Units into the skin daily. 03/06/21   Angiulli, Mcarthur Rossetti, PA-C  lurasidone (LATUDA) 20 MG TABS tablet Take 1 tablet (20 mg total) by mouth daily. 03/06/21   Angiulli, Mcarthur Rossetti, PA-C  methocarbamol (ROBAXIN) 500 MG tablet Take 1 tablet (500 mg total) by mouth every 6 (six) hours as needed for muscle spasms. 03/06/21   Angiulli, Mcarthur Rossetti, PA-C  Multiple Vitamin (MULTIVITAMIN WITH MINERALS) TABS tablet Take 1 tablet by mouth daily. 02/24/21   Kathlen Mody, MD  oxyCODONE-acetaminophen (PERCOCET/ROXICET) 5-325 MG tablet Take 1-2 tablets by mouth every 4 (four) hours as needed for moderate pain. 03/06/21   Angiulli, Mcarthur Rossetti, PA-C  polyethylene glycol (MIRALAX / GLYCOLAX) 17 g packet Take 17 g by mouth 2 (two) times daily as needed for mild constipation. 03/06/21   Angiulli, Mcarthur Rossetti, PA-C  senna-docusate (SENOKOT-S) 8.6-50 MG tablet Take 2 tablets by mouth at bedtime. 03/06/21   Angiulli, Mcarthur Rossetti, PA-C  sertraline (ZOLOFT) 100 MG tablet Take 1.5 tablets (150 mg total) by mouth daily. 03/06/21   Angiulli, Mcarthur Rossetti, PA-C  traZODone (DESYREL) 100 MG tablet Take 1 tablet (100 mg total) by mouth at bedtime. 03/06/21    Angiulli, Mcarthur Rossetti, PA-C    Allergies    Patient has no known allergies.  Review of Systems   Review of Systems  All other systems reviewed and are negative.  Physical Exam Updated Vital Signs BP 106/83    Pulse 96    Temp 98.6 F (37 C) (Oral)    Resp 20    Ht 1.702 m (5\' 7" )    Wt 103 kg    SpO2 100%    BMI 35.55 kg/m   Physical Exam Vitals and nursing note reviewed.  Constitutional:      General: He is not in acute distress.    Appearance: He is well-developed.  HENT:     Head: Normocephalic and atraumatic.     Mouth/Throat:     Pharynx: No oropharyngeal exudate.  Eyes:     General: No scleral icterus.       Right eye: No discharge.        Left eye: No discharge.  Conjunctiva/sclera: Conjunctivae normal.     Pupils: Pupils are equal, round, and reactive to light.  Neck:     Thyroid: No thyromegaly.     Vascular: No JVD.  Cardiovascular:     Rate and Rhythm: Normal rate and regular rhythm.     Heart sounds: Normal heart sounds. No murmur heard.   No friction rub. No gallop.  Pulmonary:     Effort: Pulmonary effort is normal. No respiratory distress.     Breath sounds: Normal breath sounds. No wheezing or rales.  Abdominal:     General: Bowel sounds are normal. There is no distension.     Palpations: Abdomen is soft. There is no mass.     Tenderness: There is no abdominal tenderness.  Musculoskeletal:        General: No tenderness. Normal range of motion.     Cervical back: Normal range of motion and neck supple.  Lymphadenopathy:     Cervical: No cervical adenopathy.  Skin:    General: Skin is warm and dry.     Findings: No erythema or rash.     Comments: There is a large granulated wound on the posterior lateral aspect of the left lower extremity of the calf.  The surgical site on the stump of the left foot is intact, there is no dehiscence no foul smell, no drainage no redness.  The patient has no foul smell or drainage or redness of the wound on the back  of the calf either.  Neurological:     Mental Status: He is alert.     Coordination: Coordination normal.  Psychiatric:        Behavior: Behavior normal.    ED Results / Procedures / Treatments   Labs (all labs ordered are listed, but only abnormal results are displayed) Labs Reviewed  COMPREHENSIVE METABOLIC PANEL - Abnormal; Notable for the following components:      Result Value   Glucose, Bld 123 (*)    BUN 32 (*)    Creatinine, Ser 1.45 (*)    ALT 49 (*)    Total Bilirubin 0.2 (*)    GFR, Estimated 53 (*)    All other components within normal limits  CBC WITH DIFFERENTIAL/PLATELET - Abnormal; Notable for the following components:   RBC 3.09 (*)    Hemoglobin 8.7 (*)    HCT 27.1 (*)    RDW 16.0 (*)    Platelets 514 (*)    All other components within normal limits  LACTIC ACID, PLASMA  LACTIC ACID, PLASMA    EKG None  Radiology No results found.  Procedures Procedures   Medications Ordered in ED Medications  bacitracin ointment 1 application (has no administration in time range)  doxycycline (VIBRA-TABS) tablet 100 mg (has no administration in time range)    ED Course  I have reviewed the triage vital signs and the nursing notes.  Pertinent labs & imaging results that were available during my care of the patient were reviewed by me and considered in my medical decision making (see chart for details).    MDM Rules/Calculators/A&P                          Lab work shows no leukocytosis, has a normal lactic acid of 1.6 and his blood work shows a slight renal insufficiency with a creatinine of 1.45 and a BUN of 32.  I have encouraged the patient to drink plenty of clear liquids, he  is agreeable.  He has absolutely no symptoms and wounds that are well-appearing.  I had a long discussion with the patient regarding the treatment of this wound versus observation and they are adamant that the wound care nurse felt like it was infected based on smell.  Though I do not  see any high risk features here he is high risk based on his peripheral vascular disease and thus I will apply topical antibiotic with a sterile dressing and start the patient on doxycycline, he has follow-up with vascular surgery on Tuesday for follow-up of his recent hospitalization     Final Clinical Impression(s) / ED Diagnoses Final diagnoses:  Cellulitis, wound, post-operative    Rx / DC Orders ED Discharge Orders          Ordered    doxycycline (VIBRAMYCIN) 100 MG capsule  2 times daily        03/09/21 1840             Eber Hong, MD 03/10/21 1128

## 2021-03-09 NOTE — Telephone Encounter (Signed)
I let Adam Perez know and she will call him to go get evaluated.

## 2021-03-09 NOTE — Telephone Encounter (Signed)
Adam Perez from [facility] called to report that Adam Perez's wound is having purulent drainage that has a strong odor. Dressing was saturated. Please advise.

## 2021-03-09 NOTE — Discharge Instructions (Signed)
Please drink plenty of clear liquids, your blood work shows that you may be a touch dehydrated.  Please make sure you are taking doxycycline twice a day for the next 7 days to treat for infection  See your doctor in the office on Tuesday at your appointment for a wound check  Emergency department for severe or worsening pain, swelling, fever or spreading redness or drainage

## 2021-03-13 ENCOUNTER — Other Ambulatory Visit: Payer: Self-pay

## 2021-03-13 ENCOUNTER — Encounter: Payer: Self-pay | Admitting: Physician Assistant

## 2021-03-13 ENCOUNTER — Ambulatory Visit (HOSPITAL_COMMUNITY)
Admission: RE | Admit: 2021-03-13 | Discharge: 2021-03-13 | Disposition: A | Discharge status: Home / Self Care | Payer: Medicare Other | Source: Ambulatory Visit | Attending: Vascular Surgery | Admitting: Vascular Surgery

## 2021-03-13 ENCOUNTER — Ambulatory Visit (INDEPENDENT_AMBULATORY_CARE_PROVIDER_SITE_OTHER): Payer: Medicare Other | Admitting: Physician Assistant

## 2021-03-13 VITALS — BP 129/87 | HR 85 | Temp 98.2°F | Resp 20 | Ht 67.0 in | Wt 227.0 lb

## 2021-03-13 DIAGNOSIS — I739 Peripheral vascular disease, unspecified: Secondary | ICD-10-CM | POA: Insufficient documentation

## 2021-03-13 NOTE — Progress Notes (Signed)
POST OPERATIVE OFFICE NOTE    CC:  F/u for surgery  HPI:  This is a 66 y.o. male who is s/p LLE angiogram, left popliteal angioplasty, left transmetatarsal amputation and left calf venous ulcer sharp excisional debridement with application of skin substitute to left calf on 02/19/21 by Dr. Stanford Breed. He did well post operatively and was discharged to CIR post op day 4. He remained in CIR until 03/07/21 and was discharged home from there. He was instructed to apply santyl to left lower extremity in a nickel thick layer, cover with moistened saline gauze, dry gauze, ABD pad, and then wrap with Kerlix and Coban- change every other day.   On 12/30 the home RN called with concerns about malodor from leg wound. Was advised we could not prescribe antibiotics without seeing the wound for evaluation. He and his wife decided they were going to go to [facility] ED for evaluation. ER Provider did not see any concerning signs of infection based on appearance of wound, but due to being high risk for infection sent him home on a 7 day course of Doxycycline.   He presents today with his wife. He says overall he is feeling well. He denies any fever or chills. They have been concerned with malodor of the wound. Unsure which wound has been bad smelling but home RN brought it to their attention. They have been using the Santyl and wet to dry dressings to lateral leg wound and then just washing the TMA site with wound cleanser. He has been doing PT but otherwise does not ambulate much on his own.   No Known Allergies  Current Outpatient Medications  Medication Sig Dispense Refill   acetaminophen (TYLENOL) 325 MG tablet Take 2 tablets (650 mg total) by mouth every 6 (six) hours as needed for mild pain (or Fever >/= 101). 30 tablet 3   amLODipine (NORVASC) 10 MG tablet Take 1 tablet (10 mg total) by mouth daily. 30 tablet 0   aspirin EC 81 MG tablet Take 1 tablet (81 mg total) by mouth daily with breakfast. 120 tablet 3    atorvastatin (LIPITOR) 40 MG tablet Take 1 tablet (40 mg total) by mouth daily. 30 tablet 0   clopidogrel (PLAVIX) 75 MG tablet Take 1 tablet (75 mg total) by mouth daily. 30 tablet 0   doxycycline (VIBRAMYCIN) 100 MG capsule Take 1 capsule (100 mg total) by mouth 2 (two) times daily for 7 days. 14 capsule 0   insulin glargine (LANTUS) 100 UNIT/ML Solostar Pen Inject 10 Units into the skin at bedtime. 15 mL 11   insulin glargine (LANTUS) 100 UNIT/ML Solostar Pen Inject 10 Units into the skin daily. 15 mL 11   lurasidone (LATUDA) 20 MG TABS tablet Take 1 tablet (20 mg total) by mouth daily. 30 tablet 0   methocarbamol (ROBAXIN) 500 MG tablet Take 1 tablet (500 mg total) by mouth every 6 (six) hours as needed for muscle spasms. 60 tablet 0   Multiple Vitamin (MULTIVITAMIN WITH MINERALS) TABS tablet Take 1 tablet by mouth daily.     oxyCODONE-acetaminophen (PERCOCET/ROXICET) 5-325 MG tablet Take 1-2 tablets by mouth every 4 (four) hours as needed for moderate pain. 30 tablet 0   polyethylene glycol (MIRALAX / GLYCOLAX) 17 g packet Take 17 g by mouth 2 (two) times daily as needed for mild constipation. 14 each 0   QUICKVUE AT-HOME COVID-19 TEST KIT See admin instructions.     senna-docusate (SENOKOT-S) 8.6-50 MG tablet Take 2 tablets by  mouth at bedtime.     sertraline (ZOLOFT) 100 MG tablet Take 1.5 tablets (150 mg total) by mouth daily. 45 tablet 0   traZODone (DESYREL) 100 MG tablet Take 1 tablet (100 mg total) by mouth at bedtime. 30 tablet 0   No current facility-administered medications for this visit.     ROS:  See HPI  Physical Exam:  Vitals:   03/13/21 1527  BP: 129/87  Pulse: 85  Resp: 20  Temp: 98.2 F (36.8 C)  TempSrc: Temporal  SpO2: 98%  Weight: 227 lb (103 kg)  Height: 5' 7"  (1.702 m)   General: well appearing, well nourished Incision:  left TMA staples barely intact. Staples removed. TMA incision with maceration and starting to dehisce. No purulence on compression  surrounding flaps. Posterior flap does not appear very healthy. Tender to palpation. Rest of foot appears viable.  Extremities:  lateral posterior venous leg wound with healthy appearing granulation tissue. Xeroform, ABD and kerlix wrap applied. ACE bandage applied to leg.  Neuro: alert and oriented   Non invasive vascular lab: +-------+-----------+-----------+------------+------------+   ABI/TBI Today's ABI Today's TBI Previous ABI Previous TBI   +-------+-----------+-----------+------------+------------+   Right   1.21        0.52        1.15                        +-------+-----------+-----------+------------+------------+   Left    [facility]          amputated   0.77                        +-------+-----------+-----------+------------+------------+   Assessment/Plan:  This is a 66 y.o. male who is s/p LLE angiogram, left popliteal angioplasty, left transmetatarsal amputation and left calf venous ulcer sharp excisional debridement with application of skin substitute to left calf on 02/19/21 by Dr. Stanford Breed. ABI today shows non compressible ABI on the Left. With known single vessel runoff this is not ideal for wound healing. He has however been optimized from a revascularization standpoint. We recommend diligent continued wound care to left lower extremity wound and TMA - paint betadine to left TMA stump daily - Try to keep both wounds clean and dry -Patient seen with Dr. Stanford Breed. Discussion was had with patient and his wife regarding debridement vs more proximal amputation vs watchful waiting with close follow up. Recommendation was made to try to continue close observation to see if over next several weeks there is any improvement. Patient and his wife understand he is at high risk for more proximal amputation - he will return in 4 weeks for wound check with Dr. Stanford Breed. They know to call sooner if the foot starts to get worse   Karoline Caldwell, PA-C [facility] [phone]  Clinic MD:  Roxanne Mins

## 2021-03-16 ENCOUNTER — Telehealth: Payer: Self-pay

## 2021-03-16 NOTE — Telephone Encounter (Signed)
Patient just seen at [facility] with malodorous non healing TMA wound. Completed 7 day course of doxycycline last night. Kristie from [facility] calls today to report that wound is still malodorous and draining pus. Patient has heater directed at foot wound and RN instructed against this. Patient is not currently having fever or chills and RN thinks patient can wait til Tuesday to be seen. Called and set up appt with patient and advised if he developed fever chills or general malaise over the weekend to report to urgent care/ED. Patient verbalizes understanding.

## 2021-03-19 NOTE — Progress Notes (Signed)
POST OPERATIVE OFFICE NOTE    CC:  F/u for surgery  HPI:  This is a 66 y.o. male who is s/p LLE angiogram, left popliteal angioplasty, left transmetatarsal amputation and left calf venous ulcer sharp excisional debridement with application of skin substitute to left calf on 02/19/21 by Dr. Stanford Breed. He did well post operatively and was discharged to CIR post op day 4. He remained in CIR until 03/07/21 and was discharged home from there. He was instructed to apply santyl to left lower extremity in a nickel thick layer, cover with moistened saline gauze, dry gauze, ABD pad, and then wrap with Kerlix and Coban- change every other day.   On 12/30 the home RN called with concerns about malodor from leg wound. Was advised we could not prescribe antibiotics without seeing the wound for evaluation. He and his wife decided they were going to go to [facility] ED for evaluation. ER Provider did not see any concerning signs of infection based on appearance of wound, but due to being high risk for infection sent him home on a 7 day course of Doxycycline.   At his last visit with PA Baglia, he says overall he is feeling well. He denies any fever or chills. They have been concerned with malodor of the wound. Unsure which wound has been bad smelling but home RN brought it to their attention. They have been using the Santyl and wet to dry dressings to lateral leg wound and then just washing the TMA site with wound cleanser. He has been doing PT but otherwise does not ambulate much on his own.   03/20/21: Patient returns for surveillance of his left transmetatarsal amputation.  Unfortunately this is continued to deteriorate.  I counseled him that conversion to an open transmetatarsal amputation had a high risk for failure.  He is not interested in pursuing this.  He would like to pursue a left below-knee amputation.  No Known Allergies  Current Outpatient Medications  Medication Sig Dispense Refill   acetaminophen (TYLENOL)  325 MG tablet Take 2 tablets (650 mg total) by mouth every 6 (six) hours as needed for mild pain (or Fever >/= 101). 30 tablet 3   amLODipine (NORVASC) 10 MG tablet Take 1 tablet (10 mg total) by mouth daily. 30 tablet 0   aspirin EC 81 MG tablet Take 1 tablet (81 mg total) by mouth daily with breakfast. 120 tablet 3   atorvastatin (LIPITOR) 40 MG tablet Take 1 tablet (40 mg total) by mouth daily. 30 tablet 0   clopidogrel (PLAVIX) 75 MG tablet Take 1 tablet (75 mg total) by mouth daily. 30 tablet 0   insulin glargine (LANTUS) 100 UNIT/ML Solostar Pen Inject 10 Units into the skin at bedtime. 15 mL 11   insulin glargine (LANTUS) 100 UNIT/ML Solostar Pen Inject 10 Units into the skin daily. 15 mL 11   lurasidone (LATUDA) 20 MG TABS tablet Take 1 tablet (20 mg total) by mouth daily. 30 tablet 0   methocarbamol (ROBAXIN) 500 MG tablet Take 1 tablet (500 mg total) by mouth every 6 (six) hours as needed for muscle spasms. 60 tablet 0   Multiple Vitamin (MULTIVITAMIN WITH MINERALS) TABS tablet Take 1 tablet by mouth daily.     oxyCODONE-acetaminophen (PERCOCET/ROXICET) 5-325 MG tablet Take 1-2 tablets by mouth every 4 (four) hours as needed for moderate pain. 30 tablet 0   polyethylene glycol (MIRALAX / GLYCOLAX) 17 g packet Take 17 g by mouth 2 (two) times daily as needed  for mild constipation. 14 each 0   QUICKVUE AT-HOME COVID-19 TEST KIT See admin instructions.     senna-docusate (SENOKOT-S) 8.6-50 MG tablet Take 2 tablets by mouth at bedtime.     sertraline (ZOLOFT) 100 MG tablet Take 1.5 tablets (150 mg total) by mouth daily. 45 tablet 0   traZODone (DESYREL) 100 MG tablet Take 1 tablet (100 mg total) by mouth at bedtime. 30 tablet 0   No current facility-administered medications for this visit.     ROS:  See HPI  Physical Exam:  There were no vitals filed for this visit.  General: well appearing, well nourished Incision:  left TMA staples barely intact. Staples removed. TMA incision with  maceration and starting to dehisce. No purulence on compression surrounding flaps. Posterior flap does not appear very healthy. Tender to palpation. Rest of foot appears viable.  Extremities:  lateral posterior venous leg wound with healthy appearing granulation tissue. Xeroform, ABD and kerlix wrap applied. ACE bandage applied to leg.  Neuro: alert and oriented   Non invasive vascular lab: +-------+-----------+-----------+------------+------------+   ABI/TBI Today's ABI Today's TBI Previous ABI Previous TBI   +-------+-----------+-----------+------------+------------+   Right   1.21        0.52        1.15                        +-------+-----------+-----------+------------+------------+   Left    [facility]          amputated   0.77                        +-------+-----------+-----------+------------+------------+   Assessment/Plan:  This is a 66 y.o. male who is s/p LLE angiogram, left popliteal angioplasty, left transmetatarsal amputation and left calf venous ulcer sharp excisional debridement with application of skin substitute to left calf on 02/19/21. His TMA is unfortunately not healing. Plan L BKA in near future.  Yevonne Aline. Stanford Breed, MD [facility] Phone Number: [phone] 03/20/2021 4:21 PM

## 2021-03-19 NOTE — H&P (View-Only) (Signed)
POST OPERATIVE OFFICE NOTE    CC:  F/u for surgery  HPI:  This is a 66 y.o. male who is s/p LLE angiogram, left popliteal angioplasty, left transmetatarsal amputation and left calf venous ulcer sharp excisional debridement with application of skin substitute to left calf on 02/19/21 by Dr. Stanford Breed. He did well post operatively and was discharged to CIR post op day 4. He remained in CIR until 03/07/21 and was discharged home from there. He was instructed to apply santyl to left lower extremity in a nickel thick layer, cover with moistened saline gauze, dry gauze, ABD pad, and then wrap with Kerlix and Coban- change every other day.   On 12/30 the home RN called with concerns about malodor from leg wound. Was advised we could not prescribe antibiotics without seeing the wound for evaluation. He and his wife decided they were going to go to [facility] for evaluation. ER Provider did not see any concerning signs of infection based on appearance of wound, but due to being high risk for infection sent him home on a 7 day course of Doxycycline.   At his last visit with PA Baglia, he says overall he is feeling well. He denies any fever or chills. They have been concerned with malodor of the wound. Unsure which wound has been bad smelling but home RN brought it to their attention. They have been using the Santyl and wet to dry dressings to lateral leg wound and then just washing the TMA site with wound cleanser. He has been doing PT but otherwise does not ambulate much on his own.   03/20/21: Patient returns for surveillance of his left transmetatarsal amputation.  Unfortunately this is continued to deteriorate.  I counseled him that conversion to an open transmetatarsal amputation had a high risk for failure.  He is not interested in pursuing this.  He would like to pursue a left below-knee amputation.  No Known Allergies  Current Outpatient Medications  Medication Sig Dispense Refill   acetaminophen (TYLENOL)  325 MG tablet Take 2 tablets (650 mg total) by mouth every 6 (six) hours as needed for mild pain (or Fever >/= 101). 30 tablet 3   amLODipine (NORVASC) 10 MG tablet Take 1 tablet (10 mg total) by mouth daily. 30 tablet 0   aspirin EC 81 MG tablet Take 1 tablet (81 mg total) by mouth daily with breakfast. 120 tablet 3   atorvastatin (LIPITOR) 40 MG tablet Take 1 tablet (40 mg total) by mouth daily. 30 tablet 0   clopidogrel (PLAVIX) 75 MG tablet Take 1 tablet (75 mg total) by mouth daily. 30 tablet 0   insulin glargine (LANTUS) 100 UNIT/ML Solostar Pen Inject 10 Units into the skin at bedtime. 15 mL 11   insulin glargine (LANTUS) 100 UNIT/ML Solostar Pen Inject 10 Units into the skin daily. 15 mL 11   lurasidone (LATUDA) 20 MG TABS tablet Take 1 tablet (20 mg total) by mouth daily. 30 tablet 0   methocarbamol (ROBAXIN) 500 MG tablet Take 1 tablet (500 mg total) by mouth every 6 (six) hours as needed for muscle spasms. 60 tablet 0   Multiple Vitamin (MULTIVITAMIN WITH MINERALS) TABS tablet Take 1 tablet by mouth daily.     oxyCODONE-acetaminophen (PERCOCET/ROXICET) 5-325 MG tablet Take 1-2 tablets by mouth every 4 (four) hours as needed for moderate pain. 30 tablet 0   polyethylene glycol (MIRALAX / GLYCOLAX) 17 g packet Take 17 g by mouth 2 (two) times daily as needed  for mild constipation. 14 each 0   QUICKVUE AT-HOME COVID-19 TEST KIT See admin instructions.     senna-docusate (SENOKOT-S) 8.6-50 MG tablet Take 2 tablets by mouth at bedtime.     sertraline (ZOLOFT) 100 MG tablet Take 1.5 tablets (150 mg total) by mouth daily. 45 tablet 0   traZODone (DESYREL) 100 MG tablet Take 1 tablet (100 mg total) by mouth at bedtime. 30 tablet 0   No current facility-administered medications for this visit.     ROS:  See HPI  Physical Exam:  There were no vitals filed for this visit.  General: well appearing, well nourished Incision:  left TMA staples barely intact. Staples removed. TMA incision with  maceration and starting to dehisce. No purulence on compression surrounding flaps. Posterior flap does not appear very healthy. Tender to palpation. Rest of foot appears viable.  Extremities:  lateral posterior venous leg wound with healthy appearing granulation tissue. Xeroform, ABD and kerlix wrap applied. ACE bandage applied to leg.  Neuro: alert and oriented   Non invasive vascular lab: +-------+-----------+-----------+------------+------------+   ABI/TBI Today's ABI Today's TBI Previous ABI Previous TBI   +-------+-----------+-----------+------------+------------+   Right   1.21        0.52        1.15                        +-------+-----------+-----------+------------+------------+   Left    [place]          amputated   0.77                        +-------+-----------+-----------+------------+------------+   Assessment/Plan:  This is a 66 y.o. male who is s/p LLE angiogram, left popliteal angioplasty, left transmetatarsal amputation and left calf venous ulcer sharp excisional debridement with application of skin substitute to left calf on 02/19/21. His TMA is unfortunately not healing. Plan L BKA in near future.  Yevonne Aline. Stanford Breed, MD [facility] Phone Number: [phone] 03/20/2021 4:21 PM

## 2021-03-20 ENCOUNTER — Other Ambulatory Visit: Payer: Self-pay

## 2021-03-20 ENCOUNTER — Encounter: Payer: Medicare Other | Admitting: Registered Nurse

## 2021-03-20 ENCOUNTER — Ambulatory Visit (INDEPENDENT_AMBULATORY_CARE_PROVIDER_SITE_OTHER): Payer: Self-pay | Admitting: Vascular Surgery

## 2021-03-20 ENCOUNTER — Encounter: Payer: Self-pay | Admitting: Vascular Surgery

## 2021-03-20 VITALS — BP 103/67 | HR 92 | Temp 98.5°F | Resp 20 | Ht 67.0 in | Wt 227.0 lb

## 2021-03-20 DIAGNOSIS — I739 Peripheral vascular disease, unspecified: Secondary | ICD-10-CM

## 2021-03-20 DIAGNOSIS — I70245 Atherosclerosis of native arteries of left leg with ulceration of other part of foot: Secondary | ICD-10-CM

## 2021-03-20 DIAGNOSIS — T8789 Other complications of amputation stump: Secondary | ICD-10-CM

## 2021-03-20 DIAGNOSIS — Z419 Encounter for procedure for purposes other than remedying health state, unspecified: Secondary | ICD-10-CM

## 2021-03-22 ENCOUNTER — Other Ambulatory Visit: Payer: Self-pay | Admitting: Vascular Surgery

## 2021-03-22 LAB — SARS CORONAVIRUS 2 (TAT 6-24 HRS): SARS Coronavirus 2: NEGATIVE

## 2021-03-22 NOTE — Addendum Note (Signed)
Addended by: Nicholas Lose on: 03/22/2021 10:03 AM   Modules accepted: Orders

## 2021-03-23 ENCOUNTER — Encounter (HOSPITAL_COMMUNITY): Payer: Self-pay | Admitting: Vascular Surgery

## 2021-03-23 ENCOUNTER — Other Ambulatory Visit: Payer: Self-pay

## 2021-03-23 NOTE — Progress Notes (Signed)
patient's wife voiced understanding of arrival time of 10

## 2021-03-23 NOTE — Progress Notes (Signed)
Spoke with pt's wife, Pattricia Boss for pre-op call. Pt was in the same room answering questions also. Pt denies any cardiac history. Pt is treated for HTN and Diabetes. Pattricia Boss states pt's fasting blood sugar is usually around  110-114. Pt's most recent A1C was 8.6 on 02/20/21. Instructed Pattricia Boss and pt to take 1/2 of his regular dose of Lantus Sunday PM, he will take 5 units. Instructed pt and Pattricia Boss to check his blood sugar when he gets up and every 2 hours until he leaves for the hospital. If blood sugar is 70 or below, treat with 1/2 cup of clear juice (apple or cranberry) and recheck blood sugar 15 minutes after drinking juice. If blood sugar continues to be 70 or below, call the Short Stay department and ask to speak to a nurse. Pattricia Boss voiced understanding.  Pattricia Boss states pt's last dose of Plavix was Wednesday, 03/20/21 PM dose.  Covid test done 03/22/21 and it's negative.

## 2021-03-26 ENCOUNTER — Inpatient Hospital Stay (HOSPITAL_COMMUNITY): Payer: Medicare Other

## 2021-03-26 ENCOUNTER — Inpatient Hospital Stay (HOSPITAL_COMMUNITY)
Admission: RE | Admit: 2021-03-26 | Discharge: 2021-03-30 | DRG: 240 | Disposition: A | Discharge status: Rehabilitation Facility | Payer: Medicare Other | Attending: Vascular Surgery | Admitting: Vascular Surgery

## 2021-03-26 ENCOUNTER — Encounter (HOSPITAL_COMMUNITY)
Admission: RE | Disposition: A | Discharge status: Rehabilitation Facility | Payer: Self-pay | Source: Home / Self Care | Attending: Vascular Surgery

## 2021-03-26 ENCOUNTER — Inpatient Hospital Stay (HOSPITAL_COMMUNITY): Payer: Medicare Other | Admitting: Certified Registered Nurse Anesthetist

## 2021-03-26 DIAGNOSIS — Z6831 Body mass index (BMI) 31.0-31.9, adult: Secondary | ICD-10-CM | POA: Diagnosis not present

## 2021-03-26 DIAGNOSIS — L97221 Non-pressure chronic ulcer of left calf limited to breakdown of skin: Secondary | ICD-10-CM | POA: Diagnosis present

## 2021-03-26 DIAGNOSIS — I69354 Hemiplegia and hemiparesis following cerebral infarction affecting left non-dominant side: Secondary | ICD-10-CM | POA: Diagnosis not present

## 2021-03-26 DIAGNOSIS — Z20822 Contact with and (suspected) exposure to covid-19: Secondary | ICD-10-CM | POA: Diagnosis present

## 2021-03-26 DIAGNOSIS — F4310 Post-traumatic stress disorder, unspecified: Secondary | ICD-10-CM | POA: Diagnosis present

## 2021-03-26 DIAGNOSIS — I70245 Atherosclerosis of native arteries of left leg with ulceration of other part of foot: Secondary | ICD-10-CM | POA: Diagnosis not present

## 2021-03-26 DIAGNOSIS — E1142 Type 2 diabetes mellitus with diabetic polyneuropathy: Secondary | ICD-10-CM | POA: Diagnosis present

## 2021-03-26 DIAGNOSIS — E1151 Type 2 diabetes mellitus with diabetic peripheral angiopathy without gangrene: Secondary | ICD-10-CM | POA: Diagnosis present

## 2021-03-26 DIAGNOSIS — I70248 Atherosclerosis of native arteries of left leg with ulceration of other part of lower left leg: Secondary | ICD-10-CM | POA: Diagnosis present

## 2021-03-26 DIAGNOSIS — D649 Anemia, unspecified: Secondary | ICD-10-CM | POA: Diagnosis present

## 2021-03-26 DIAGNOSIS — Z79899 Other long term (current) drug therapy: Secondary | ICD-10-CM

## 2021-03-26 DIAGNOSIS — E1152 Type 2 diabetes mellitus with diabetic peripheral angiopathy with gangrene: Secondary | ICD-10-CM | POA: Diagnosis present

## 2021-03-26 DIAGNOSIS — I739 Peripheral vascular disease, unspecified: Secondary | ICD-10-CM | POA: Diagnosis not present

## 2021-03-26 DIAGNOSIS — R7401 Elevation of levels of liver transaminase levels: Secondary | ICD-10-CM | POA: Diagnosis not present

## 2021-03-26 DIAGNOSIS — Z8616 Personal history of COVID-19: Secondary | ICD-10-CM | POA: Diagnosis not present

## 2021-03-26 DIAGNOSIS — I10 Essential (primary) hypertension: Secondary | ICD-10-CM | POA: Diagnosis present

## 2021-03-26 DIAGNOSIS — D62 Acute posthemorrhagic anemia: Secondary | ICD-10-CM | POA: Diagnosis not present

## 2021-03-26 DIAGNOSIS — I70262 Atherosclerosis of native arteries of extremities with gangrene, left leg: Secondary | ICD-10-CM | POA: Diagnosis present

## 2021-03-26 DIAGNOSIS — Z794 Long term (current) use of insulin: Secondary | ICD-10-CM | POA: Diagnosis not present

## 2021-03-26 DIAGNOSIS — Z7984 Long term (current) use of oral hypoglycemic drugs: Secondary | ICD-10-CM | POA: Diagnosis not present

## 2021-03-26 DIAGNOSIS — Z741 Need for assistance with personal care: Secondary | ICD-10-CM | POA: Diagnosis present

## 2021-03-26 DIAGNOSIS — Z7902 Long term (current) use of antithrombotics/antiplatelets: Secondary | ICD-10-CM | POA: Diagnosis not present

## 2021-03-26 DIAGNOSIS — Z7982 Long term (current) use of aspirin: Secondary | ICD-10-CM | POA: Diagnosis not present

## 2021-03-26 DIAGNOSIS — T8789 Other complications of amputation stump: Secondary | ICD-10-CM

## 2021-03-26 DIAGNOSIS — E11622 Type 2 diabetes mellitus with other skin ulcer: Secondary | ICD-10-CM | POA: Diagnosis present

## 2021-03-26 DIAGNOSIS — I959 Hypotension, unspecified: Secondary | ICD-10-CM | POA: Diagnosis not present

## 2021-03-26 DIAGNOSIS — E785 Hyperlipidemia, unspecified: Secondary | ICD-10-CM | POA: Diagnosis present

## 2021-03-26 DIAGNOSIS — M25511 Pain in right shoulder: Secondary | ICD-10-CM | POA: Diagnosis not present

## 2021-03-26 DIAGNOSIS — R609 Edema, unspecified: Secondary | ICD-10-CM | POA: Diagnosis present

## 2021-03-26 DIAGNOSIS — E1169 Type 2 diabetes mellitus with other specified complication: Secondary | ICD-10-CM | POA: Diagnosis not present

## 2021-03-26 DIAGNOSIS — G546 Phantom limb syndrome with pain: Secondary | ICD-10-CM | POA: Diagnosis present

## 2021-03-26 DIAGNOSIS — E669 Obesity, unspecified: Secondary | ICD-10-CM | POA: Diagnosis present

## 2021-03-26 DIAGNOSIS — Z833 Family history of diabetes mellitus: Secondary | ICD-10-CM

## 2021-03-26 DIAGNOSIS — Z6835 Body mass index (BMI) 35.0-35.9, adult: Secondary | ICD-10-CM | POA: Diagnosis not present

## 2021-03-26 DIAGNOSIS — Y835 Amputation of limb(s) as the cause of abnormal reaction of the patient, or of later complication, without mention of misadventure at the time of the procedure: Secondary | ICD-10-CM | POA: Diagnosis not present

## 2021-03-26 DIAGNOSIS — Z419 Encounter for procedure for purposes other than remedying health state, unspecified: Secondary | ICD-10-CM

## 2021-03-26 DIAGNOSIS — Z4781 Encounter for orthopedic aftercare following surgical amputation: Secondary | ICD-10-CM | POA: Diagnosis present

## 2021-03-26 DIAGNOSIS — Z89512 Acquired absence of left leg below knee: Secondary | ICD-10-CM | POA: Diagnosis not present

## 2021-03-26 DIAGNOSIS — M62838 Other muscle spasm: Secondary | ICD-10-CM | POA: Diagnosis not present

## 2021-03-26 HISTORY — DX: Anemia, unspecified: D64.9

## 2021-03-26 HISTORY — PX: AMPUTATION: SHX166

## 2021-03-26 HISTORY — DX: Post-traumatic stress disorder, unspecified: F43.10

## 2021-03-26 HISTORY — PX: APPLICATION OF WOUND VAC: SHX5189

## 2021-03-26 LAB — URINALYSIS, ROUTINE W REFLEX MICROSCOPIC
Bilirubin Urine: NEGATIVE
Glucose, UA: NEGATIVE mg/dL
Hgb urine dipstick: NEGATIVE
Ketones, ur: NEGATIVE mg/dL
Leukocytes,Ua: NEGATIVE
Nitrite: NEGATIVE
Protein, ur: NEGATIVE mg/dL
Specific Gravity, Urine: 1.015 (ref 1.005–1.030)
pH: 5.5 (ref 5.0–8.0)

## 2021-03-26 LAB — COMPREHENSIVE METABOLIC PANEL
ALT: 24 U/L (ref 0–44)
AST: 20 U/L (ref 15–41)
Albumin: 3.2 g/dL — ABNORMAL LOW (ref 3.5–5.0)
Alkaline Phosphatase: 97 U/L (ref 38–126)
Anion gap: 10 (ref 5–15)
BUN: 19 mg/dL (ref 8–23)
CO2: 23 mmol/L (ref 22–32)
Calcium: 9.1 mg/dL (ref 8.9–10.3)
Chloride: 105 mmol/L (ref 98–111)
Creatinine, Ser: 1.22 mg/dL (ref 0.61–1.24)
GFR, Estimated: >60 mL/min (ref >60)
Glucose, Bld: 134 mg/dL — ABNORMAL HIGH (ref 70–99)
Potassium: 3.8 mmol/L (ref 3.5–5.1)
Sodium: 138 mmol/L (ref 135–145)
Total Bilirubin: 0.4 mg/dL (ref 0.3–1.2)
Total Protein: 7.6 g/dL (ref 6.5–8.1)

## 2021-03-26 LAB — CBC
HCT: 28.7 % — ABNORMAL LOW (ref 39.0–52.0)
Hemoglobin: 9.5 g/dL — ABNORMAL LOW (ref 13.0–17.0)
MCH: 29.2 pg (ref 26.0–34.0)
MCHC: 33.1 g/dL (ref 30.0–36.0)
MCV: 88.3 fL (ref 80.0–100.0)
Platelets: 343 10*3/uL (ref 150–400)
RBC: 3.25 MIL/uL — ABNORMAL LOW (ref 4.22–5.81)
RDW: 15.6 % — ABNORMAL HIGH (ref 11.5–15.5)
WBC: 8.5 10*3/uL (ref 4.0–10.5)
nRBC: 0 % (ref 0.0–0.2)

## 2021-03-26 LAB — GLUCOSE, CAPILLARY
Glucose-Capillary: 120 mg/dL — ABNORMAL HIGH (ref 70–99)
Glucose-Capillary: 141 mg/dL — ABNORMAL HIGH (ref 70–99)
Glucose-Capillary: 140 mg/dL — ABNORMAL HIGH (ref 70–99)
Glucose-Capillary: 140 mg/dL — ABNORMAL HIGH (ref 70–99)
Glucose-Capillary: 141 mg/dL — ABNORMAL HIGH (ref 70–99)

## 2021-03-26 LAB — PROTIME-INR
INR: 1.1 (ref 0.8–1.2)
Prothrombin Time: 13.8 seconds (ref 11.4–15.2)

## 2021-03-26 LAB — APTT: aPTT: 25 seconds (ref 24–36)

## 2021-03-26 SURGERY — AMPUTATION BELOW KNEE
Anesthesia: General | Site: Leg Lower | Laterality: Left

## 2021-03-26 MED ORDER — METFORMIN HCL 500 MG PO TABS
500.0000 mg | ORAL_TABLET | Freq: Two times a day (BID) | ORAL | Status: DC
  Administered 2021-03-26 – 2021-03-30 (×8): 500 mg via ORAL
  Filled 2021-03-26 (×8): qty 1

## 2021-03-26 MED ORDER — SODIUM CHLORIDE 0.9% FLUSH
3.0000 mL | Freq: Two times a day (BID) | INTRAVENOUS | Status: DC
  Administered 2021-03-26 – 2021-03-30 (×7): 3 mL via INTRAVENOUS

## 2021-03-26 MED ORDER — ORAL CARE MOUTH RINSE: 15.0000 mL | Freq: Once | OROMUCOSAL | Status: AC

## 2021-03-26 MED ORDER — FENTANYL CITRATE (PF) 250 MCG/5ML IJ SOLN
INTRAMUSCULAR | Status: DC | PRN
  Administered 2021-03-26 (×3): 50 ug via INTRAVENOUS
  Administered 2021-03-26: 150 ug via INTRAVENOUS

## 2021-03-26 MED ORDER — FENTANYL CITRATE (PF) 250 MCG/5ML IJ SOLN
INTRAMUSCULAR | Status: AC
  Filled 2021-03-26: qty 5

## 2021-03-26 MED ORDER — 0.9 % SODIUM CHLORIDE (POUR BTL) OPTIME
TOPICAL | Status: DC | PRN
  Administered 2021-03-26: 1000 mL

## 2021-03-26 MED ORDER — METOPROLOL TARTRATE 5 MG/5ML IV SOLN: 2.0000 mg | INTRAVENOUS | Status: DC | PRN

## 2021-03-26 MED ORDER — PHENYLEPHRINE 40 MCG/ML (10ML) SYRINGE FOR IV PUSH (FOR BLOOD PRESSURE SUPPORT)
PREFILLED_SYRINGE | INTRAVENOUS | Status: AC
  Filled 2021-03-26: qty 10

## 2021-03-26 MED ORDER — HYDRALAZINE HCL 20 MG/ML IJ SOLN: 5.0000 mg | INTRAMUSCULAR | Status: DC | PRN

## 2021-03-26 MED ORDER — ASPIRIN EC 81 MG PO TBEC
81.0000 mg | DELAYED_RELEASE_TABLET | Freq: Every day | ORAL | Status: DC
  Administered 2021-03-27 – 2021-03-30 (×4): 81 mg via ORAL
  Filled 2021-03-26 (×4): qty 1

## 2021-03-26 MED ORDER — SODIUM CHLORIDE 0.9 % IV SOLN: 250.0000 mL | INTRAVENOUS | Status: DC | PRN

## 2021-03-26 MED ORDER — SENNOSIDES-DOCUSATE SODIUM 8.6-50 MG PO TABS: 1.0000 | ORAL_TABLET | Freq: Every evening | ORAL | Status: DC | PRN

## 2021-03-26 MED ORDER — ACETAMINOPHEN 500 MG PO TABS
500.0000 mg | ORAL_TABLET | Freq: Four times a day (QID) | ORAL | Status: AC
  Administered 2021-03-26 (×2): 500 mg via ORAL
  Filled 2021-03-26 (×2): qty 1

## 2021-03-26 MED ORDER — GUAIFENESIN-DM 100-10 MG/5ML PO SYRP: 15.0000 mL | ORAL_SOLUTION | ORAL | Status: DC | PRN

## 2021-03-26 MED ORDER — INSULIN ASPART 100 UNIT/ML IJ SOLN
0.0000 [IU] | Freq: Three times a day (TID) | INTRAMUSCULAR | Status: DC
  Administered 2021-03-26 – 2021-03-28 (×4): 2 [IU] via SUBCUTANEOUS
  Administered 2021-03-29: 3 [IU] via SUBCUTANEOUS
  Administered 2021-03-29 – 2021-03-30 (×3): 2 [IU] via SUBCUTANEOUS

## 2021-03-26 MED ORDER — MAGNESIUM SULFATE 2 GM/50ML IV SOLN: 2.0000 g | Freq: Every day | INTRAVENOUS | Status: DC | PRN

## 2021-03-26 MED ORDER — DEXMEDETOMIDINE (PRECEDEX) IN NS 20 MCG/5ML (4 MCG/ML) IV SYRINGE
PREFILLED_SYRINGE | INTRAVENOUS | Status: DC | PRN
  Administered 2021-03-26: 20 ug via INTRAVENOUS

## 2021-03-26 MED ORDER — LISINOPRIL-HYDROCHLOROTHIAZIDE 20-25 MG PO TABS: 1.0000 | ORAL_TABLET | Freq: Every day | ORAL | Status: DC

## 2021-03-26 MED ORDER — CLOPIDOGREL BISULFATE 75 MG PO TABS
75.0000 mg | ORAL_TABLET | Freq: Every day | ORAL | Status: DC
  Administered 2021-03-27 – 2021-03-30 (×4): 75 mg via ORAL
  Filled 2021-03-26 (×4): qty 1

## 2021-03-26 MED ORDER — FLEET ENEMA 7-19 GM/118ML RE ENEM: 1.0000 | ENEMA | Freq: Once | RECTAL | Status: DC | PRN

## 2021-03-26 MED ORDER — EPHEDRINE SULFATE-NACL 50-0.9 MG/10ML-% IV SOSY
PREFILLED_SYRINGE | INTRAVENOUS | Status: DC | PRN
  Administered 2021-03-26: 15 mg via INTRAVENOUS
  Administered 2021-03-26: 10 mg via INTRAVENOUS

## 2021-03-26 MED ORDER — PROPOFOL 10 MG/ML IV BOLUS
INTRAVENOUS | Status: DC | PRN
  Administered 2021-03-26: 200 mg via INTRAVENOUS
  Administered 2021-03-26 (×2): 100 mg via INTRAVENOUS

## 2021-03-26 MED ORDER — PHENYLEPHRINE 40 MCG/ML (10ML) SYRINGE FOR IV PUSH (FOR BLOOD PRESSURE SUPPORT)
PREFILLED_SYRINGE | INTRAVENOUS | Status: DC | PRN
  Administered 2021-03-26 (×2): 160 ug via INTRAVENOUS

## 2021-03-26 MED ORDER — SODIUM CHLORIDE 0.9% FLUSH: 3.0000 mL | INTRAVENOUS | Status: DC | PRN

## 2021-03-26 MED ORDER — DIPHENHYDRAMINE HCL 12.5 MG/5ML PO ELIX
12.5000 mg | ORAL_SOLUTION | ORAL | Status: DC | PRN
  Filled 2021-03-26: qty 10

## 2021-03-26 MED ORDER — LACTATED RINGERS IV SOLN: INTRAVENOUS | Status: DC

## 2021-03-26 MED ORDER — CHLORHEXIDINE GLUCONATE 0.12 % MT SOLN
15.0000 mL | Freq: Once | OROMUCOSAL | Status: AC
  Administered 2021-03-26: 15 mL via OROMUCOSAL
  Filled 2021-03-26: qty 15

## 2021-03-26 MED ORDER — HYDROCODONE-ACETAMINOPHEN 5-325 MG PO TABS
1.0000 | ORAL_TABLET | ORAL | Status: DC | PRN
  Administered 2021-03-26: 1 via ORAL
  Administered 2021-03-27 – 2021-03-28 (×5): 2 via ORAL
  Administered 2021-03-29: 1 via ORAL
  Administered 2021-03-29 – 2021-03-30 (×2): 2 via ORAL
  Filled 2021-03-26 (×4): qty 2
  Filled 2021-03-26: qty 1
  Filled 2021-03-26 (×2): qty 2
  Filled 2021-03-26: qty 1
  Filled 2021-03-26: qty 2
  Filled 2021-03-26: qty 1

## 2021-03-26 MED ORDER — HYDROCODONE-ACETAMINOPHEN 7.5-325 MG PO TABS
1.0000 | ORAL_TABLET | ORAL | Status: DC | PRN
  Administered 2021-03-26: 2 via ORAL
  Administered 2021-03-26: 1 via ORAL
  Administered 2021-03-27 (×3): 2 via ORAL
  Filled 2021-03-26 (×5): qty 2

## 2021-03-26 MED ORDER — PHENYLEPHRINE HCL-NACL 20-0.9 MG/250ML-% IV SOLN
INTRAVENOUS | Status: DC | PRN
  Administered 2021-03-26: 60 ug/min via INTRAVENOUS

## 2021-03-26 MED ORDER — SODIUM CHLORIDE 0.9 % IV SOLN: INTRAVENOUS | Status: DC

## 2021-03-26 MED ORDER — BISACODYL 10 MG RE SUPP: 10.0000 mg | Freq: Every day | RECTAL | Status: DC | PRN

## 2021-03-26 MED ORDER — MORPHINE SULFATE (PF) 2 MG/ML IV SOLN: 0.5000 mg | INTRAVENOUS | Status: DC | PRN

## 2021-03-26 MED ORDER — ACETAMINOPHEN 500 MG PO TABS
1000.0000 mg | ORAL_TABLET | Freq: Once | ORAL | Status: AC
  Administered 2021-03-26: 1000 mg via ORAL
  Filled 2021-03-26: qty 2

## 2021-03-26 MED ORDER — ROCURONIUM BROMIDE 10 MG/ML (PF) SYRINGE
PREFILLED_SYRINGE | INTRAVENOUS | Status: AC
  Filled 2021-03-26: qty 10

## 2021-03-26 MED ORDER — AMLODIPINE BESYLATE 10 MG PO TABS
10.0000 mg | ORAL_TABLET | Freq: Every day | ORAL | Status: DC
  Administered 2021-03-29 – 2021-03-30 (×2): 10 mg via ORAL
  Filled 2021-03-26 (×5): qty 1

## 2021-03-26 MED ORDER — PHENOL 1.4 % MT LIQD: 1.0000 | OROMUCOSAL | Status: DC | PRN

## 2021-03-26 MED ORDER — PANTOPRAZOLE SODIUM 40 MG PO TBEC
40.0000 mg | DELAYED_RELEASE_TABLET | Freq: Every day | ORAL | Status: DC
  Administered 2021-03-27 – 2021-03-30 (×4): 40 mg via ORAL
  Filled 2021-03-26 (×4): qty 1

## 2021-03-26 MED ORDER — HYDROCHLOROTHIAZIDE 25 MG PO TABS
25.0000 mg | ORAL_TABLET | Freq: Every day | ORAL | Status: DC
  Administered 2021-03-29: 25 mg via ORAL
  Filled 2021-03-26 (×4): qty 1

## 2021-03-26 MED ORDER — AMISULPRIDE (ANTIEMETIC) 5 MG/2ML IV SOLN: 10.0000 mg | Freq: Once | INTRAVENOUS | Status: DC | PRN

## 2021-03-26 MED ORDER — CHLORHEXIDINE GLUCONATE CLOTH 2 % EX PADS: 6.0000 | MEDICATED_PAD | Freq: Once | CUTANEOUS | Status: DC

## 2021-03-26 MED ORDER — LISINOPRIL 20 MG PO TABS
20.0000 mg | ORAL_TABLET | Freq: Every day | ORAL | Status: DC
  Administered 2021-03-29: 20 mg via ORAL
  Filled 2021-03-26 (×4): qty 1

## 2021-03-26 MED ORDER — CEFAZOLIN SODIUM-DEXTROSE 2-4 GM/100ML-% IV SOLN
2.0000 g | INTRAVENOUS | Status: AC
  Administered 2021-03-26: 2 g via INTRAVENOUS
  Filled 2021-03-26: qty 100

## 2021-03-26 MED ORDER — ONDANSETRON HCL 4 MG/2ML IJ SOLN
INTRAMUSCULAR | Status: DC | PRN
Start: 2021-03-26 — End: 2021-03-26
  Administered 2021-03-26: 4 mg via INTRAVENOUS

## 2021-03-26 MED ORDER — DOCUSATE SODIUM 100 MG PO CAPS
100.0000 mg | ORAL_CAPSULE | Freq: Every day | ORAL | Status: DC
  Administered 2021-03-27: 100 mg via ORAL
  Filled 2021-03-26 (×4): qty 1

## 2021-03-26 MED ORDER — POTASSIUM CHLORIDE CRYS ER 20 MEQ PO TBCR: 20.0000 meq | EXTENDED_RELEASE_TABLET | Freq: Every day | ORAL | Status: DC | PRN

## 2021-03-26 MED ORDER — ALUM & MAG HYDROXIDE-SIMETH 200-200-20 MG/5ML PO SUSP: 15.0000 mL | ORAL | Status: DC | PRN

## 2021-03-26 MED ORDER — DEXMEDETOMIDINE (PRECEDEX) IN NS 20 MCG/5ML (4 MCG/ML) IV SYRINGE
PREFILLED_SYRINGE | INTRAVENOUS | Status: AC
  Filled 2021-03-26: qty 5

## 2021-03-26 MED ORDER — LIDOCAINE 2% (20 MG/ML) 5 ML SYRINGE
INTRAMUSCULAR | Status: DC | PRN
Start: 2021-03-26 — End: 2021-03-26
  Administered 2021-03-26: 40 mg via INTRAVENOUS

## 2021-03-26 MED ORDER — ONDANSETRON HCL 4 MG/2ML IJ SOLN: 4.0000 mg | Freq: Four times a day (QID) | INTRAMUSCULAR | Status: DC | PRN

## 2021-03-26 MED ORDER — DEXAMETHASONE SODIUM PHOSPHATE 10 MG/ML IJ SOLN
INTRAMUSCULAR | Status: AC
  Filled 2021-03-26: qty 1

## 2021-03-26 MED ORDER — DEXAMETHASONE SODIUM PHOSPHATE 10 MG/ML IJ SOLN
INTRAMUSCULAR | Status: DC | PRN
  Administered 2021-03-26: 8 mg via INTRAVENOUS

## 2021-03-26 MED ORDER — ROPIVACAINE HCL 5 MG/ML IJ SOLN
INTRAMUSCULAR | Status: DC | PRN
  Administered 2021-03-26: 20 mL via PERINEURAL

## 2021-03-26 MED ORDER — FENTANYL CITRATE (PF) 100 MCG/2ML IJ SOLN: 25.0000 ug | INTRAMUSCULAR | Status: DC | PRN

## 2021-03-26 MED ORDER — CEFAZOLIN SODIUM-DEXTROSE 2-4 GM/100ML-% IV SOLN
2.0000 g | Freq: Three times a day (TID) | INTRAVENOUS | Status: AC
  Administered 2021-03-26 (×2): 2 g via INTRAVENOUS
  Filled 2021-03-26 (×2): qty 100

## 2021-03-26 MED ORDER — ALBUMIN HUMAN 5 % IV SOLN
12.5000 g | Freq: Once | INTRAVENOUS | Status: AC
  Administered 2021-03-26: 12.5 g via INTRAVENOUS

## 2021-03-26 MED ORDER — ALBUMIN HUMAN 5 % IV SOLN
INTRAVENOUS | Status: AC
  Filled 2021-03-26: qty 250

## 2021-03-26 MED ORDER — HEPARIN SODIUM (PORCINE) 5000 UNIT/ML IJ SOLN
5000.0000 [IU] | Freq: Three times a day (TID) | INTRAMUSCULAR | Status: DC
  Administered 2021-03-27 – 2021-03-30 (×9): 5000 [IU] via SUBCUTANEOUS
  Filled 2021-03-26 (×9): qty 1

## 2021-03-26 MED ORDER — BUPIVACAINE-EPINEPHRINE (PF) 0.5% -1:200000 IJ SOLN
INTRAMUSCULAR | Status: DC | PRN
  Administered 2021-03-26: 15 mL via PERINEURAL

## 2021-03-26 MED ORDER — ATORVASTATIN CALCIUM 40 MG PO TABS
40.0000 mg | ORAL_TABLET | Freq: Every day | ORAL | Status: DC
  Administered 2021-03-26 – 2021-03-29 (×4): 40 mg via ORAL
  Filled 2021-03-26 (×5): qty 1

## 2021-03-26 MED ORDER — BACLOFEN 10 MG PO TABS
10.0000 mg | ORAL_TABLET | Freq: Three times a day (TID) | ORAL | Status: DC | PRN
  Filled 2021-03-26: qty 1

## 2021-03-26 MED ORDER — ACETAMINOPHEN 325 MG PO TABS: 325.0000 mg | ORAL_TABLET | Freq: Four times a day (QID) | ORAL | Status: DC | PRN

## 2021-03-26 MED ORDER — BUPIVACAINE LIPOSOME 1.3 % IJ SUSP
INTRAMUSCULAR | Status: DC | PRN
  Administered 2021-03-26: 10 mL via PERINEURAL

## 2021-03-26 MED ORDER — LABETALOL HCL 5 MG/ML IV SOLN: 10.0000 mg | INTRAVENOUS | Status: DC | PRN

## 2021-03-26 MED ORDER — ONDANSETRON HCL 4 MG/2ML IJ SOLN
INTRAMUSCULAR | Status: AC
  Filled 2021-03-26: qty 2

## 2021-03-26 SURGICAL SUPPLY — 75 items
APL PRP STRL LF DISP 70% ISPRP (MISCELLANEOUS) ×2
BAG COUNTER SPONGE SURGICOUNT (BAG) ×3 IMPLANT
BAG SPNG CNTER NS LX DISP (BAG) ×2
BANDAGE ESMARK 6X9 LF (GAUZE/BANDAGES/DRESSINGS) ×1 IMPLANT
BLADE SAGITTAL 25.0X1.19X90 (BLADE) ×2 IMPLANT
BLADE SURG 21 STRL SS (BLADE) ×3 IMPLANT
BNDG CMPR 9X6 STRL LF SNTH (GAUZE/BANDAGES/DRESSINGS) ×2
BNDG COHESIVE 6X5 TAN STRL LF (GAUZE/BANDAGES/DRESSINGS) ×3 IMPLANT
BNDG ESMARK 6X9 LF (GAUZE/BANDAGES/DRESSINGS) ×3
CANISTER SUCT 3000ML PPV (MISCELLANEOUS) ×3 IMPLANT
CATH ROBINSON RED A/P 18FR (CATHETERS) ×2 IMPLANT
CHLORAPREP W/TINT 26 (MISCELLANEOUS) ×3 IMPLANT
CLIP LIGATING EXTRA MED SLVR (CLIP) ×2 IMPLANT
CLIP LIGATING EXTRA SM BLUE (MISCELLANEOUS) ×2 IMPLANT
COVER SURGICAL LIGHT HANDLE (MISCELLANEOUS) ×3 IMPLANT
CUFF TOURN SGL QUICK 42 (TOURNIQUET CUFF) ×2 IMPLANT
DRAIN CHANNEL 19F RND (DRAIN) IMPLANT
DRAPE C-ARM 42X72 X-RAY (DRAPES) ×3 IMPLANT
DRAPE DERMATAC (DRAPES) IMPLANT
DRAPE HALF SHEET 40X57 (DRAPES) ×3 IMPLANT
DRAPE INCISE IOBAN 66X45 STRL (DRAPES) ×2 IMPLANT
DRAPE ORTHO SPLIT 77X108 STRL (DRAPES) ×6
DRAPE SURG ORHT 6 SPLT 77X108 (DRAPES) ×4 IMPLANT
DRESSING PREVENA PLUS CUSTOM (GAUZE/BANDAGES/DRESSINGS) ×1 IMPLANT
DRSG DERMACEA 8X12 NADH (GAUZE/BANDAGES/DRESSINGS) ×4 IMPLANT
DRSG PREVENA PLUS CUSTOM (GAUZE/BANDAGES/DRESSINGS) ×3
ELECT REM PT RETURN 9FT ADLT (ELECTROSURGICAL) ×3
ELECTRODE REM PT RTRN 9FT ADLT (ELECTROSURGICAL) ×2 IMPLANT
EVACUATOR SILICONE 100CC (DRAIN) IMPLANT
GAUZE XEROFORM 5X9 LF (GAUZE/BANDAGES/DRESSINGS) ×1 IMPLANT
GLOVE SURG POLYISO LF SZ8 (GLOVE) ×3 IMPLANT
GOWN STRL REUS W/ TWL LRG LVL3 (GOWN DISPOSABLE) ×4 IMPLANT
GOWN STRL REUS W/ TWL XL LVL3 (GOWN DISPOSABLE) ×2 IMPLANT
GOWN STRL REUS W/TWL LRG LVL3 (GOWN DISPOSABLE) ×6
GOWN STRL REUS W/TWL XL LVL3 (GOWN DISPOSABLE) ×3
KIT BASIN OR (CUSTOM PROCEDURE TRAY) ×3 IMPLANT
KIT PALINDROME-P 55CM (CATHETERS) IMPLANT
KIT TURNOVER KIT B (KITS) ×3 IMPLANT
NDL 18GX1X1/2 (RX/OR ONLY) (NEEDLE) ×1 IMPLANT
NDL HYPO 25GX1X1/2 BEV (NEEDLE) ×1 IMPLANT
NEEDLE 18GX1X1/2 (RX/OR ONLY) (NEEDLE) ×3 IMPLANT
NEEDLE HYPO 25GX1X1/2 BEV (NEEDLE) ×3 IMPLANT
NS IRRIG 1000ML POUR BTL (IV SOLUTION) ×3 IMPLANT
PACK GENERAL/GYN (CUSTOM PROCEDURE TRAY) ×3 IMPLANT
PACK SURGICAL SETUP 50X90 (CUSTOM PROCEDURE TRAY) ×3 IMPLANT
PAD ARMBOARD 7.5X6 YLW CONV (MISCELLANEOUS) ×6 IMPLANT
PADDING CAST ABS 4INX4YD NS (CAST SUPPLIES) ×1
PADDING CAST ABS COTTON 4X4 ST (CAST SUPPLIES) ×1 IMPLANT
PENCIL SMOKE EVACUATOR (MISCELLANEOUS) ×3 IMPLANT
PREVENA RESTOR AXIOFORM 29X28 (GAUZE/BANDAGES/DRESSINGS) ×2 IMPLANT
SPONGE T-LAP 18X18 ~~LOC~~+RFID (SPONGE) ×6 IMPLANT
STAPLER SKIN 35 REG (STAPLE) ×3 IMPLANT
STAPLER VISISTAT 35W (STAPLE) ×3 IMPLANT
STOCKINETTE IMPERVIOUS LG (DRAPES) ×3 IMPLANT
STRIP CLOSURE SKIN 1/2X4 (GAUZE/BANDAGES/DRESSINGS) ×3 IMPLANT
SUT ETHILON 2 0 PSLX (SUTURE) ×8 IMPLANT
SUT ETHILON 2 LR (SUTURE) ×6 IMPLANT
SUT FIBERWIRE #5 38 CONV NDL (SUTURE) ×3
SUT SILK 0 TIES 10X30 (SUTURE) ×3 IMPLANT
SUT SILK 2 0 (SUTURE) ×3
SUT SILK 2 0 SH CR/8 (SUTURE) ×3 IMPLANT
SUT SILK 2-0 18XBRD TIE 12 (SUTURE) ×2 IMPLANT
SUT SILK 3 0 (SUTURE) ×3
SUT SILK 3-0 18XBRD TIE 12 (SUTURE) ×1 IMPLANT
SUT VIC AB 0 CT1 18XCR BRD 8 (SUTURE) ×4 IMPLANT
SUT VIC AB 0 CT1 8-18 (SUTURE) ×6
SUTURE FIBERWR #5 38 CONV NDL (SUTURE) ×1 IMPLANT
SYR 10ML LL (SYRINGE) ×3 IMPLANT
SYR 20ML LL LF (SYRINGE) ×6 IMPLANT
SYR 5ML LL (SYRINGE) ×3 IMPLANT
SYR CONTROL 10ML LL (SYRINGE) ×3 IMPLANT
TOWEL GREEN STERILE (TOWEL DISPOSABLE) ×6 IMPLANT
TOWEL GREEN STERILE FF (TOWEL DISPOSABLE) ×6 IMPLANT
UNDERPAD 30X36 HEAVY ABSORB (UNDERPADS AND DIAPERS) ×3 IMPLANT
WATER STERILE IRR 1000ML POUR (IV SOLUTION) ×3 IMPLANT

## 2021-03-26 NOTE — Anesthesia Preprocedure Evaluation (Signed)
Anesthesia Evaluation  Patient identified by MRN, date of birth, ID band Patient awake    Reviewed: Allergy & Precautions, NPO status , Patient's Chart, lab work & pertinent test results  Airway Mallampati: II  TM Distance: >3 FB Neck ROM: Full    Dental  (+) Dental Advisory Given   Pulmonary neg pulmonary ROS,    breath sounds clear to auscultation       Cardiovascular hypertension, Pt. on medications + Peripheral Vascular Disease   Rhythm:Regular Rate:Normal     Neuro/Psych CVA    GI/Hepatic negative GI ROS, Neg liver ROS,   Endo/Other  diabetes, Type 2, Insulin Dependent  Renal/GU Renal InsufficiencyRenal disease     Musculoskeletal   Abdominal   Peds  Hematology  (+) anemia ,   Anesthesia Other Findings   Reproductive/Obstetrics                             Anesthesia Physical Anesthesia Plan  ASA: 3  Anesthesia Plan: General   Post-op Pain Management: Regional block and Tylenol PO (pre-op)   Induction:   PONV Risk Score and Plan: 2 and Dexamethasone, Ondansetron and Treatment may vary due to age or medical condition  Airway Management Planned: LMA  Additional Equipment:   Intra-op Plan:   Post-operative Plan: Extubation in OR  Informed Consent: I have reviewed the patients History and Physical, chart, labs and discussed the procedure including the risks, benefits and alternatives for the proposed anesthesia with the patient or authorized representative who has indicated his/her understanding and acceptance.     Dental advisory given  Plan Discussed with: CRNA  Anesthesia Plan Comments:         Anesthesia Quick Evaluation

## 2021-03-26 NOTE — Anesthesia Procedure Notes (Signed)
Anesthesia Regional Block: Popliteal block   Pre-Anesthetic Checklist: , timeout performed,  Correct Patient, Correct Site, Correct Laterality,  Correct Procedure, Correct Position, site marked,  Risks and benefits discussed,  Surgical consent,  Pre-op evaluation,  At surgeon's request and post-op pain management  Laterality: Left  Prep: chloraprep       Needles:  Injection technique: Single-shot  Needle Type: Echogenic Stimulator Needle     Needle Length: 9cm  Needle Gauge: 21     Additional Needles:   Procedures:, nerve stimulator,,, ultrasound used (permanent image in chart),,     Nerve Stimulator or Paresthesia:  Response: plantar flexion and eversion, 1 mA  Additional Responses:   Narrative:  Start time: 03/26/2021 7:07 AM End time: 03/26/2021 7:15 AM Injection made incrementally with aspirations every 5 mL.  Performed by: Personally  Anesthesiologist: Suzette Battiest, MD

## 2021-03-26 NOTE — Transfer of Care (Signed)
Immediate Anesthesia Transfer of Care Note  Patient: Adam Perez  Procedure(s) Performed: LEFT BELOW KNEE AMPUTATION (Left: Leg Lower) APPLICATION OF INCISIONAL WOUND VAC (Left: Leg Lower)  Patient Location: PACU  Anesthesia Type:GA combined with regional for post-op pain  Level of Consciousness: awake, alert  and oriented  Airway & Oxygen Therapy: Patient Spontanous Breathing  Post-op Assessment: Report given to RN and Post -op Vital signs reviewed and stable  Post vital signs: Reviewed and stable  Last Vitals:  Vitals Value Taken Time  BP    Temp    Pulse 101 03/26/21 0923  Resp 15 03/26/21 0923  SpO2 100 % 03/26/21 0923  Vitals shown include unvalidated device data.  Last Pain:  Vitals:   03/26/21 0623  TempSrc:   PainSc: 3       Patients Stated Pain Goal: 2 ([id number])  Complications: No notable events documented.

## 2021-03-26 NOTE — Anesthesia Procedure Notes (Signed)
Anesthesia Regional Block: Adductor canal block   Pre-Anesthetic Checklist: , timeout performed,  Correct Patient, Correct Site, Correct Laterality,  Correct Procedure, Correct Position, site marked,  Risks and benefits discussed,  Surgical consent,  Pre-op evaluation,  At surgeon's request and post-op pain management  Laterality: Left  Prep: chloraprep       Needles:  Injection technique: Single-shot  Needle Type: Echogenic Needle     Needle Length: 9cm  Needle Gauge: 21     Additional Needles:   Procedures:,,,, ultrasound used (permanent image in chart),,    Narrative:  Start time: 03/26/2021 7:15 AM End time: 03/26/2021 7:19 AM Injection made incrementally with aspirations every 5 mL.  Performed by: Personally  Anesthesiologist: Suzette Battiest, MD

## 2021-03-26 NOTE — Progress Notes (Signed)
Patient arrived to 4E from PACU. Vital signs taken and stable. Patient placed on tele and CCMD notified. Patient assessed and wound vac on continuous at 125. Patient oriented to unit and staff. Call bell within reach Wife at bedside.  Adam Perez

## 2021-03-26 NOTE — Progress Notes (Signed)
Orthopedic Tech Progress Note Patient Details:  Adam Perez 30-Dec-1955 [id number]  Called in order to [facility] for a VIVE PROTOCOL BK  Patient ID: Adam Perez, male   DOB: 03/05/1956, 66 y.o.   MRN: [id number]  Adam Perez 03/26/2021, 9:45 AM

## 2021-03-26 NOTE — Anesthesia Procedure Notes (Signed)
Procedure Name: LMA Insertion Date/Time: 03/26/2021 7:45 AM Performed by: Darryl Nestle, CRNA Pre-anesthesia Checklist: Patient identified, Emergency Drugs available, Suction available and Patient being monitored Patient Re-evaluated:Patient Re-evaluated prior to induction Oxygen Delivery Method: Circle system utilized Preoxygenation: Pre-oxygenation with 100% oxygen Induction Type: IV induction Ventilation: Mask ventilation without difficulty LMA Size: 4.0 Tube type: Oral Number of attempts: 1 Placement Confirmation: positive ETCO2 and breath sounds checked- equal and bilateral Tube secured with: Tape Dental Injury: Teeth and Oropharynx as per pre-operative assessment

## 2021-03-26 NOTE — Progress Notes (Addendum)
Inpatient Rehab Admissions Coordinator:   CIR consult received. I await PT/OT evaluations to make a assessment  of candidacy.   Megan Salon, MS, CCC-SLP Rehab Admissions Coordinator  [phone] (celll) [phone] (office)

## 2021-03-26 NOTE — Op Note (Signed)
DATE OF SERVICE: 03/26/2021  PATIENT:  Adam Perez  66 y.o. male  PRE-OPERATIVE DIAGNOSIS: atherosclerosis of native arteries of left lower extremity causing ulceration; no further options for revascularization  POST-OPERATIVE DIAGNOSIS:  Same  PROCEDURE:   Left below knee amputation  SURGEON:  Surgeon(s) and Role:    * Leonie Douglas, MD - Primary  ASSISTANT: Nathanial Rancher, PA-C  An assistant was required to facilitate exposure and expedite the case.  ANESTHESIA:   regional and general  EBL:  BLOOD ADMINISTERED:none  DRAINS: none   LOCAL MEDICATIONS USED:  NONE  SPECIMEN:  residual left lower extremity  COUNTS: confirmed correct.  TOURNIQUET:    Total Tourniquet Time Documented: Thigh (Left) - 9 minutes Total: Thigh (Left) - 9 minutes   PATIENT DISPOSITION:  PACU - hemodynamically stable.   Delay start of Pharmacological VTE agent (>24hrs) due to surgical blood loss or risk of bleeding: no  INDICATION FOR PROCEDURE: Adam Perez is a 66 y.o. male with nonhealing left transmetatarsal amputation after successful endovascular revascularization of the popliteal artery stenosis.  I counseled the patient that he was unlikely to heal his transmetatarsal amputation, even with diligent wound care. After careful discussion of risks, benefits, and alternatives the patient was offered left below-knee amputation. The patient understood and wished to proceed.  OPERATIVE FINDINGS: Woody, edematous skin of the left lower extremity made reconstruction difficult.  Otherwise unremarkable below-knee amputation.  DESCRIPTION OF PROCEDURE: After identification of the patient in the pre-operative holding area, the patient was transferred to the operating room. The patient was positioned supine on the operating room table. Anesthesia was induced. The left leg was prepped and draped in standard fashion. A surgical pause was performed confirming correct patient, procedure, and  operative location.  A tourniquet was placed on the left thigh. The skin of the leg was marked to plan the anterior flap 10 cm distal to the tibial tuberosity. The anterior flap measured two thirds the circumference of the calf. The posterior flap was measured to be one third of the circumference of the calf in length.   The leg was exsanguinated with an Esmarch tourniquet. The pneumatic tourniquet was inflated to 250 mm Hg. The flaps were created using pre-made marks using a 21 blade. The incision was carried down through subcutaneous tissue, fascia, and muscle anteriorly. The periosteal tissue was elevated anteriorly so that the tibia was about 3 cm shorter than the anterior skin flap.  The tibia was transected with a power saw. An anterior wedge was created with the power saw.  Then I smoothed out the rough edges with a rasp.  In a similar fashion, I cut back the fibula about two centimeters higher than the level of the tibia with an angled bone cutter.  The posterior flap was completed with an amputation knife.  The specimen was passed off the field. All visible arteries and veins were clamped and ligated using silk suture.  The tourniquet was deflated.  Hemostasis was achieved in the flaps using electrocautery and silk suture.  The stump was washed with sterile normal saline and no further active bleeding was noted.    I reapproximated the anterior and posterior fascia  with interrupted stitches of 2-0 Vicryl.  This was completed along the entire length of anterior and posterior fascia until there was no more loose space in the fascial line.  The skin was then reapproximated with staples and multiple nylon sutures.  The stump was washed off and dried.  A Prevena VAC system was applied to the stump.  This was secured with derma tack and Ioban.  A good seal was achieved.  A stump shrinker was applied.  A stump protector was applied.  Upon completion of the case instrument and sharps counts were  confirmed correct. The patient was transferred to the PACU in good condition. I was present for all portions of the procedure.  Adam Perez. Adam Antu, MD [facility] Phone Number: [phone] 03/26/2021 9:29 AM

## 2021-03-26 NOTE — Interval H&P Note (Signed)
History and Physical Interval Note:  03/26/2021 7:21 AM  Adam Perez  has presented today for surgery, with the diagnosis of PAD.  The various methods of treatment have been discussed with the patient and family. After consideration of risks, benefits and other options for treatment, the patient has consented to  Procedure(s): LEFT BELOW KNEE AMPUTATION (Left) APPLICATION OF INCISIONAL WOUND VAC (Left) as a surgical intervention.  The patient's history has been reviewed, patient examined, no change in status, stable for surgery.  I have reviewed the patient's chart and labs.  Questions were answered to the patient's satisfaction.     Leonie Douglas

## 2021-03-27 ENCOUNTER — Encounter (HOSPITAL_COMMUNITY): Payer: Self-pay | Admitting: Vascular Surgery

## 2021-03-27 LAB — BASIC METABOLIC PANEL
Anion gap: 9 (ref 5–15)
BUN: 17 mg/dL (ref 8–23)
CO2: 24 mmol/L (ref 22–32)
Calcium: 9.1 mg/dL (ref 8.9–10.3)
Chloride: 107 mmol/L (ref 98–111)
Creatinine, Ser: 1.09 mg/dL (ref 0.61–1.24)
GFR, Estimated: >60 mL/min (ref >60)
Glucose, Bld: 116 mg/dL — ABNORMAL HIGH (ref 70–99)
Potassium: 4.1 mmol/L (ref 3.5–5.1)
Sodium: 140 mmol/L (ref 135–145)

## 2021-03-27 LAB — GLUCOSE, CAPILLARY
Glucose-Capillary: 109 mg/dL — ABNORMAL HIGH (ref 70–99)
Glucose-Capillary: 134 mg/dL — ABNORMAL HIGH (ref 70–99)
Glucose-Capillary: 140 mg/dL — ABNORMAL HIGH (ref 70–99)
Glucose-Capillary: 108 mg/dL — ABNORMAL HIGH (ref 70–99)

## 2021-03-27 LAB — CBC
HCT: 20 % — ABNORMAL LOW (ref 39.0–52.0)
HCT: 19 % — ABNORMAL LOW (ref 39.0–52.0)
Hemoglobin: 6.6 g/dL — CL (ref 13.0–17.0)
Hemoglobin: 6.5 g/dL — CL (ref 13.0–17.0)
MCH: 28.6 pg (ref 26.0–34.0)
MCH: 29.4 pg (ref 26.0–34.0)
MCHC: 33 g/dL (ref 30.0–36.0)
MCHC: 34.2 g/dL (ref 30.0–36.0)
MCV: 86.6 fL (ref 80.0–100.0)
MCV: 86 fL (ref 80.0–100.0)
Platelets: 266 10*3/uL (ref 150–400)
Platelets: 242 10*3/uL (ref 150–400)
RBC: 2.31 MIL/uL — ABNORMAL LOW (ref 4.22–5.81)
RBC: 2.21 MIL/uL — ABNORMAL LOW (ref 4.22–5.81)
RDW: 15.6 % — ABNORMAL HIGH (ref 11.5–15.5)
RDW: 15.7 % — ABNORMAL HIGH (ref 11.5–15.5)
WBC: 10.1 10*3/uL (ref 4.0–10.5)
WBC: 8.6 10*3/uL (ref 4.0–10.5)
nRBC: 0 % (ref 0.0–0.2)
nRBC: 0 % (ref 0.0–0.2)

## 2021-03-27 LAB — PREPARE RBC (CROSSMATCH)

## 2021-03-27 LAB — HEMOGLOBIN AND HEMATOCRIT, BLOOD
HCT: 21.2 % — ABNORMAL LOW (ref 39.0–52.0)
Hemoglobin: 7.3 g/dL — ABNORMAL LOW (ref 13.0–17.0)

## 2021-03-27 LAB — SURGICAL PATHOLOGY

## 2021-03-27 MED ORDER — SODIUM CHLORIDE 0.9% IV SOLUTION: Freq: Once | INTRAVENOUS | Status: AC

## 2021-03-27 NOTE — Progress Notes (Addendum)
°  Progress Note    03/27/2021 7:52 AM 1 Day Post-Op  Subjective:  pain overnight but better with pain medication  Afebrile   Vitals:   03/27/21 0540 03/27/21 0724  BP: 91/67 102/79  Pulse: 89 92  Resp: 19 14  Temp:  97.9 F (36.6 C)  SpO2: 96% 98%    Physical Exam: Incisions:  knee immobilizer in place   CBC    Component Value Date/Time   WBC 8.6 03/27/2021 0409   RBC 2.21 (L) 03/27/2021 0409   HGB 6.5 (LL) 03/27/2021 0409   HCT 19.0 (L) 03/27/2021 0409   PLT 242 03/27/2021 0409   MCV 86.0 03/27/2021 0409   MCH 29.4 03/27/2021 0409   MCHC 34.2 03/27/2021 0409   RDW 15.7 (H) 03/27/2021 0409   LYMPHSABS 1.7 03/09/2021 1724   MONOABS 0.9 03/09/2021 1724   EOSABS 0.1 03/09/2021 1724   BASOSABS 0.1 03/09/2021 1724    BMET    Component Value Date/Time   NA 140 03/27/2021 0151   K 4.1 03/27/2021 0151   CL 107 03/27/2021 0151   CO2 24 03/27/2021 0151   GLUCOSE 116 (H) 03/27/2021 0151   BUN 17 03/27/2021 0151   CREATININE 1.09 03/27/2021 0151   CALCIUM 9.1 03/27/2021 0151   GFRNONAA >60 03/27/2021 0151   GFRAA >60 07/06/2019 0407   GFRAA >60 07/06/2019 0407    INR    Component Value Date/Time   INR 1.1 03/26/2021 0555     Intake/Output Summary (Last 24 hours) at 03/27/2021 0752 Last data filed at 03/27/2021 0725 Gross per 24 hour  Intake 2031 ml  Output 1800 ml  Net 231 ml     Assessment/Plan:  66 y.o. male is s/p left below knee amputation  1 Day Post-Op  -pt with pain overnight but states pain medication is easing his pain.  -acute blood loss anemia-required prbc per primary team overnight.  -dressing in place with knee immobilizer.  Will keep this in place for a week or until pt is discharged.   -f/u in 4 weeks for staple removal. Our office will arrange appt.    Doreatha Massed, PA-C [facility] [phone] 03/27/2021 7:52 AM  VASCULAR STAFF ADDENDUM: I have independently interviewed and examined the patient. I  agree with the above.  Edematous stump at operation. Needs VAC x 7 days total. Mobilize as able with PT/OT. Inpatient rehab would be ideal for this patient going forward.  [name]. [name], MD [facility] Phone Number: [phone] 03/27/2021 11:27 AM

## 2021-03-27 NOTE — Progress Notes (Signed)
Inpatient Rehab Admissions Coordinator:    Following for potential CIR admit, but continue to await OT/PT notes in order to make a full assessment of candidacy.  Megan Salon, MS, CCC-SLP Rehab Admissions Coordinator  [phone] (celll) [phone] (office)

## 2021-03-27 NOTE — Evaluation (Signed)
Occupational Therapy Evaluation Patient Details Name: Adam Perez MRN: [id number] DOB: Feb 23, 1956 Today's Date: 03/27/2021   History of Present Illness Pt is a 66 y.o. male with recent L transmetatarsal amputation (02/19/21; with d/c home from CIR on 03/07/21), now admitted 03/26/21 for L BKA and wound vac placement. Other PMH includes DM, HTN, CVA (residual L-side weakness), PTSD.   Clinical Impression   PTA pt living at home with assistance as needed from ex-wife @ RW level since DC from inpatient rehab. Pt demonstrates s significant functional decline as indicated below s/p L BKA and will greatly benefit from rehab at AIR to maximize functional level of independence to facilitate safe DC home @ wc level. Acute OT to follow.      Recommendations for follow up therapy are one component of a multi-disciplinary discharge planning process, led by the attending physician.  Recommendations may be updated based on patient status, additional functional criteria and insurance authorization.   Follow Up Recommendations  Acute inpatient rehab (3hours/day)    Assistance Recommended at Discharge Intermittent Supervision/Assistance  Patient can return home with the following A little help with walking and/or transfers;A little help with bathing/dressing/bathroom;Help with stairs or ramp for entrance;Assist for transportation    Functional Status Assessment     Equipment Recommendations       Recommendations for Other Services Rehab consult     Precautions / Restrictions Precautions Precautions: Fall Precaution Comments: L sided hemi/spasticity from prior CVA Required Braces or Orthoses: Other Brace Other Brace: limb protector Restrictions LLE Weight Bearing: Non weight bearing      Mobility Bed Mobility Overal bed mobility: Needs Assistance Bed Mobility: Supine to Sit     Supine to sit: Min assist          Transfers Overall transfer level: Needs assistance Equipment used:  Rolling walker (2 wheels) Transfers: Bed to chair/wheelchair/BSC, Sit to/from Stand Sit to Stand: Max assist, +2 safety/equipment          Lateral/Scoot Transfers: Min assist (toward R)   Transfer via Lift Equipment:  (min A to CGA to power up)    Balance Overall balance assessment: Needs assistance   Sitting balance-Leahy Scale: Fair       Standing balance-Leahy Scale: Poor                             ADL either performed or assessed with clinical judgement   ADL                                               Vision Baseline Vision/History: 1 Wears glasses Ability to See in Adequate Light: 0 Adequate Patient Visual Report: No change from baseline Vision Assessment?: No apparent visual deficits     Perception     Praxis      Pertinent Vitals/Pain Pain Assessment Pain Assessment: Faces Faces Pain Scale: Hurts even more Pain Location: ::E Pain Descriptors / Indicators: Discomfort, Grimacing, Guarding Pain Intervention(s): Premedicated before session     Hand Dominance Right (was L handed prior to stroke)   Extremity/Trunk Assessment Upper Extremity Assessment Upper Extremity Assessment: LUE deficits/detail LUE Deficits / Details: residual hemiparesis; uses LUE as funcitonal assist; holds in flexion but able to demonstrate isolated movements LUE Coordination: decreased fine motor;decreased gross motor   Lower Extremity Assessment Lower Extremity Assessment:  Defer to PT evaluation   Cervical / Trunk Assessment Cervical / Trunk Assessment: Normal (R bias)   Communication Communication Communication: Expressive difficulties   Cognition Arousal/Alertness: Awake/alert Behavior During Therapy: Anxious Overall Cognitive Status: History of cognitive impairments - at baseline                                 General Comments: difficulty maintaining attentio to task; internally distrated; easily able to redirect;  perseverating on topics     General Comments  Began education on limb desensitization ; began education on use of limb protector adn positioning of LLE VSS throughout session   Exercises     Shoulder Instructions      Home Living Family/patient expects to be discharged to:: Private residence Living Arrangements: Spouse/significant other Available Help at Discharge: Family;Available PRN/intermittently (wife (separated) to move in with pt upon DC) Type of Home: House Home Access: Level entry     Home Layout: One level     Bathroom Shower/Tub: Chief Strategy OfficerTub/shower unit   Bathroom Toilet: Standard Bathroom Accessibility: Yes How Accessible: Accessible via wheelchair Home Equipment: Wheelchair - manual;Electric scooter;Cane - quad;Rollator (4 wheels);Rolling Walker (2 wheels)   Additional Comments: Pt lives alone but has family nearby if he needs help. [facility] him [facility] walk in shower.  Lives With: Alone    Prior Functioning/Environment Prior Level of Function : Independent/Modified Independent;Driving             Mobility Comments: Ambulated in home and community with quad cane ADLs Comments: Independent in ADL, IADL, driving        OT Problem List: Decreased strength;Decreased range of motion;Impaired balance (sitting and/or standing);Decreased safety awareness;Decreased knowledge of use of DME or AE;Decreased knowledge of precautions;Impaired tone;Impaired UE functional use;Pain      OT Treatment/Interventions: Self-care/ADL training;Therapeutic exercise;DME and/or AE instruction;Therapeutic activities;Cognitive remediation/compensation;Patient/family education;Balance training    OT Goals(Current goals can be found in the care plan section) Acute Rehab OT Goals Patient Stated Goal: to be able to take care of himself OT Goal Formulation: With patient Time For Goal Achievement: 04/10/21 Potential to Achieve Goals: Good  OT Frequency: Min 2X/week     Co-evaluation PT/OT/SLP Co-Evaluation/Treatment: Yes Reason for Co-Treatment: To address functional/ADL transfers;For patient/therapist safety   OT goals addressed during session: ADL's and self-care      AM-PAC OT "6 Clicks" Daily Activity     Outcome Measure Help from another person eating meals?: None Help from another person taking care of personal grooming?: A Little Help from another person toileting, which includes using toliet, bedpan, or urinal?: A Lot Help from another person bathing (including washing, rinsing, drying)?: A Lot Help from another person to put on and taking off regular upper body clothing?: A Little Help from another person to put on and taking off regular lower body clothing?: A Lot 6 Click Score: 16   End of Session Equipment Utilized During Treatment: Gait belt;Rolling walker (2 wheels) Nurse Communication: Mobility status;Weight bearing status  Activity Tolerance: Patient tolerated treatment well Patient left: in chair;with call bell/phone within reach;with chair alarm set  OT Visit Diagnosis: Unsteadiness on feet (R26.81);Other abnormalities of gait and mobility (R26.89);Muscle weakness (generalized) (M62.81);Other symptoms and signs involving cognitive function;Pain Pain - Right/Left: Left Pain - part of body: Leg                Time: [id number] OT Time Calculation (  min): 25 min Charges:  OT General Charges $OT Visit: 1 Visit OT Evaluation $OT Eval Moderate Complexity: 1 Mod  Linford Quintela, OT/L   Acute OT Clinical Specialist Acute Rehabilitation Services Pager [phone] Office [phone]   [facility] 03/27/2021, 11:30 AM

## 2021-03-27 NOTE — Progress Notes (Signed)
Lab reported critical result Hb 6.5, Dr. Randie Heinz was notified. Order was received for 1 unit of PRBC transfusion.    03/27/21 0454  Provider Notification  Provider Name/Title Dr. Randie Heinz  Date Provider Notified 03/27/21  Time Provider Notified [phone]  Notification Type Page  Notification Reason Critical result  Test performed and critical result Hb 6.5  Date Critical Result Received 03/27/21  Time Critical Result Received 0454  Provider response Evaluate remotely;See new orders  Date of Provider Response 03/27/21  Time of Provider Response 0501    Filiberto Pinks, RN

## 2021-03-27 NOTE — Progress Notes (Signed)
Mobility Specialist: Progress Note   03/27/21 1640  Mobility  Bed Position Chair  Activity  (STS x3 from recliner)  Level of Assistance Minimal assist, patient does 75% or more  Assistive Device Stedy  Activity Response Tolerated well  $Mobility charge 1 Mobility   Post-Mobility: 126 HR, 120/93 BP  Pt performed STS x3 from recliner with minA on last standing bout. Pt able to power up using contact guard the first two attempts. Pt to [facility] after session, BM successful, then back to recliner. Call bell at his side.   [facility] Khadeja Abt Mobility Specialist Mobility Specialist 4 [place]: [phone] Mobility Specialist 2 [place] and [place]: [phone]

## 2021-03-27 NOTE — Evaluation (Signed)
Physical Therapy Evaluation Patient Details Name: Adam Perez MRN: [id number] DOB: September 30, 1955 Today's Date: 03/27/2021  History of Present Illness  Pt is a 66 y.o. male with recent L transmetatarsal amputation (s/p 02/19/21; with d/c home from CIR on 03/07/21), now admitted 03/26/21 for L BKA and wound vac placement. Other PMH includes DM, HTN, CVA (residual L-side weakness), PTSD.   Clinical Impression  Pt presents with an overall decrease in functional mobility secondary to above. PTA, pt recently d/c home from CIR (03/07/21) with assist from wife, reports using wheelchair and walker for mobility; pt states ex-wife can continue providing assist upon d/c. Initiated amputee educ, including precautions, positioning, limb guard wear, therex, activity recommendations. Today, pt tolerated initiation of transfer training with up to heavy modA+2 to stand. Pt would benefit from intensive AIR-level therapies to maximize functional mobility and independence prior to return home.     Supine BP 105/71, HR 96 Sitting BP 112/79, HR 104 Post-transfer BP 130/76, HR 120s    Recommendations for follow up therapy are one component of a multi-disciplinary discharge planning process, led by the attending physician.  Recommendations may be updated based on patient status, additional functional criteria and insurance authorization.  Follow Up Recommendations Acute inpatient rehab (3hours/day)    Assistance Recommended at Discharge Intermittent Supervision/Assistance  Patient can return home with the following  A little help with walking and/or transfers;A little help with bathing/dressing/bathroom;Assistance with cooking/housework;Assist for transportation;Help with stairs or ramp for entrance    Equipment Recommendations  (TBD)  Recommendations for Other Services       Functional Status Assessment Patient has had a recent decline in their functional status and demonstrates the ability to make significant  improvements in function in a reasonable and predictable amount of time.     Precautions / Restrictions Precautions Precautions: Fall;Other (comment) Precaution Comments: L residual limb wound vac; h/o L sided hemi/spasticity from prior CVA Required Braces or Orthoses: Other Brace Other Brace: L BKA limb protector      Mobility  Bed Mobility Overal bed mobility: Needs Assistance Bed Mobility: Supine to Sit     Supine to sit: Min assist, HOB elevated     General bed mobility comments: MinA for trunk elevation, use of bed rail    Transfers Overall transfer level: Needs assistance Equipment used: None, Rolling walker (2 wheels) Transfers: Bed to chair/wheelchair/BSC, Sit to/from Stand Sit to Stand: Mod assist, +2 safety/equipment          Lateral/Scoot Transfers: Min assist General transfer comment: initial lateral scoot from bed to drop arm recliner towards R-side, pt requiring assist for setup, cues for sequencing, minA for stability; performed stand from recliner requiring heavy modA+2 for trunk elevation and stability, especially on L-side    Ambulation/Gait               General Gait Details: deferred secondary to fatigue with static standing trial  Stairs            Wheelchair Mobility    Modified Rankin (Stroke Patients Only)       Balance Overall balance assessment: Needs assistance   Sitting balance-Leahy Scale: Fair     Standing balance support: Bilateral upper extremity supported, Reliant on assistive device for balance Standing balance-Leahy Scale: Poor Standing balance comment: reliant on BUE support and external assist                             Pertinent Vitals/Pain  Pain Assessment Pain Assessment: Faces Faces Pain Scale: Hurts little more Pain Location: LLE Pain Descriptors / Indicators: Discomfort, Grimacing, Guarding Pain Intervention(s): Monitored during session, Premedicated before session    Home Living  Family/patient expects to be discharged to:: Private residence Living Arrangements: Spouse/significant other Available Help at Discharge: Family;Available PRN/intermittently Type of Home: House Home Access: Level entry       Home Layout: One level Home Equipment: Wheelchair - Occupational psychologist (4 wheels);Rolling Walker (2 wheels) Additional Comments: Pt lives alone but has family nearby if he needs help; reports ex-wife could likely move in at d/c if needed. Says [facility] him install a walk in shower.    Prior Function Prior Level of Function : Independent/Modified Independent;Driving             Mobility Comments: Difficult historian - since recent d/c home from AIR s/p L transmet amputation, pt typically using w/c, some standing and walking with darco shoe and assist ADLs Comments: Difficult historian - prior to admission for L transmet amputation, pt independent with ADL/iADLs and driving; since recent d/c home from AIR, requiring assist from ex-wife for thsi     Hand Dominance        Extremity/Trunk Assessment   Upper Extremity Assessment Upper Extremity Assessment: LUE deficits/detail LUE Deficits / Details: residual hemiparesis; uses LUE as functional assist; holds in flexion but able to demonstrate isolated movements LUE Coordination: decreased fine motor;decreased gross motor    Lower Extremity Assessment Lower Extremity Assessment: LLE deficits/detail LLE Deficits / Details: s/p L BKA; hip flex 3/5, knee flex limited by post-op pain weakness, demonstrates good ext and quad set       Communication   Communication: Expressive difficulties  Cognition Arousal/Alertness: Awake/alert Behavior During Therapy: Anxious Overall Cognitive Status: History of cognitive impairments - at baseline                                 General Comments: difficulty maintaining attention to task; internally distrated; easily able  to redirect; verbose, perseverating on topics        General Comments General comments (skin integrity, edema, etc.): Limb protector removed for skin check and L knee AROM, replaced and educ pt on don/doffing; initiated amputee education, including desensitization, importance of resting L knee in extension    Exercises Amputee Exercises Quad Sets: Left, Seated Hip Flexion/Marching: Left, Seated Knee Flexion: Left, Seated Knee Extension: Left, Seated Straight Leg Raises: Both, Seated   Assessment/Plan    PT Assessment Patient needs continued PT services  PT Problem List Decreased strength;Decreased range of motion;Decreased activity tolerance;Decreased balance;Decreased mobility;Decreased cognition;Decreased knowledge of use of DME;Decreased safety awareness;Decreased knowledge of precautions;Cardiopulmonary status limiting activity;Pain       PT Treatment Interventions DME instruction;Gait training;Functional mobility training;Therapeutic activities;Therapeutic exercise;Balance training;Patient/family education;Wheelchair mobility training;Cognitive remediation    PT Goals (Current goals can be found in the Care Plan section)  Acute Rehab PT Goals Patient Stated Goal: "When will I be able to walk again?" PT Goal Formulation: With patient Time For Goal Achievement: 04/10/21 Potential to Achieve Goals: Good    Frequency Min 3X/week     Co-evaluation               AM-PAC PT "6 Clicks" Mobility  Outcome Measure Help needed turning from your back to your side while in a flat bed without using bedrails?: A Little Help needed moving  from lying on your back to sitting on the side of a flat bed without using bedrails?: A Little Help needed moving to and from a bed to a chair (including a wheelchair)?: A Lot Help needed standing up from a chair using your arms (e.g., wheelchair or bedside chair)?: Total Help needed to walk in hospital room?: Total Help needed climbing 3-5  steps with a railing? : Total 6 Click Score: 11    End of Session Equipment Utilized During Treatment: Gait belt Activity Tolerance: Patient tolerated treatment well Patient left: in chair;with call bell/phone within reach;with chair alarm set Nurse Communication: Mobility status PT Visit Diagnosis: Other abnormalities of gait and mobility (R26.89);Muscle weakness (generalized) (M62.81);Pain    Time: 6283-1517 PT Time Calculation (min) (ACUTE ONLY): 28 min   Charges:   PT Evaluation $PT Eval Moderate Complexity: 1 Mod        Ina Homes, PT, DPT Acute Rehabilitation Services  Pager [phone] Office [phone]  Malachy Chamber 03/27/2021, 1:19 PM

## 2021-03-27 NOTE — Anesthesia Postprocedure Evaluation (Signed)
Anesthesia Post Note  Patient: Adam Perez  Procedure(s) Performed: LEFT BELOW KNEE AMPUTATION (Left: Leg Lower) APPLICATION OF INCISIONAL WOUND VAC (Left: Leg Lower)     Patient location during evaluation: PACU Anesthesia Type: General Level of consciousness: awake and alert Pain management: pain level controlled Vital Signs Assessment: post-procedure vital signs reviewed and stable Respiratory status: spontaneous breathing, nonlabored ventilation, respiratory function stable and patient connected to nasal cannula oxygen Cardiovascular status: blood pressure returned to baseline and stable Postop Assessment: no apparent nausea or vomiting Anesthetic complications: no   No notable events documented.  Last Vitals:  Vitals:   03/27/21 0724 03/27/21 1132  BP: 102/79 103/76  Pulse: 92 98  Resp: 14 16  Temp: 36.6 C (!) 36.3 C  SpO2: 98% 97%    Last Pain:  Vitals:   03/27/21 1132  TempSrc: Oral  PainSc:                  Tiajuana Amass

## 2021-03-28 LAB — TYPE AND SCREEN
ABO/RH(D): O POS
Antibody Screen: NEGATIVE
Unit division: 0

## 2021-03-28 LAB — GLUCOSE, CAPILLARY
Glucose-Capillary: 117 mg/dL — ABNORMAL HIGH (ref 70–99)
Glucose-Capillary: 120 mg/dL — ABNORMAL HIGH (ref 70–99)
Glucose-Capillary: 121 mg/dL — ABNORMAL HIGH (ref 70–99)
Glucose-Capillary: 124 mg/dL — ABNORMAL HIGH (ref 70–99)

## 2021-03-28 LAB — HEMOGLOBIN AND HEMATOCRIT, BLOOD
HCT: 22.6 % — ABNORMAL LOW (ref 39.0–52.0)
Hemoglobin: 7.4 g/dL — ABNORMAL LOW (ref 13.0–17.0)

## 2021-03-28 LAB — CBC
HCT: 22.3 % — ABNORMAL LOW (ref 39.0–52.0)
Hemoglobin: 7.7 g/dL — ABNORMAL LOW (ref 13.0–17.0)
MCH: 29.5 pg (ref 26.0–34.0)
MCHC: 34.5 g/dL (ref 30.0–36.0)
MCV: 85.4 fL (ref 80.0–100.0)
Platelets: 260 10*3/uL (ref 150–400)
RBC: 2.61 MIL/uL — ABNORMAL LOW (ref 4.22–5.81)
RDW: 15.5 % (ref 11.5–15.5)
WBC: 9.5 10*3/uL (ref 4.0–10.5)
nRBC: 0 % (ref 0.0–0.2)

## 2021-03-28 LAB — BPAM RBC
Blood Product Expiration Date: 202301222359
ISSUE DATE / TIME: 202301170511
Unit Type and Rh: 9500

## 2021-03-28 LAB — BASIC METABOLIC PANEL
Anion gap: 8 (ref 5–15)
BUN: 14 mg/dL (ref 8–23)
CO2: 24 mmol/L (ref 22–32)
Calcium: 9 mg/dL (ref 8.9–10.3)
Chloride: 104 mmol/L (ref 98–111)
Creatinine, Ser: 1 mg/dL (ref 0.61–1.24)
GFR, Estimated: >60 mL/min (ref >60)
Glucose, Bld: 118 mg/dL — ABNORMAL HIGH (ref 70–99)
Potassium: 3.6 mmol/L (ref 3.5–5.1)
Sodium: 136 mmol/L (ref 135–145)

## 2021-03-28 NOTE — Progress Notes (Signed)
Mobility Specialist: Progress Note   03/28/21 1140  Mobility  Bed Position Chair  Activity  (Stood from chair)  Level of Assistance Minimal assist, patient does 75% or more  Assistive Device Stedy  Activity Response Tolerated well  $Mobility charge 1 Mobility   Pt assisted to stand using the stedy to change pads out. Pt required minA to stand but overall does a good job of powering up. Pt back to recliner after with call bell in reach. Will f/u later today to work on transfers using RW.  [facility] Adam Perez Mobility Specialist Mobility Specialist 4 [place]: [phone] Mobility Specialist 2 [place] and [place]: [phone]

## 2021-03-28 NOTE — Progress Notes (Signed)
Mobility Specialist: Progress Note   03/28/21 1544  Mobility  Bed Position Chair  Activity Transferred from chair to bed;Transferred from bed to chair  Level of Assistance +2 (takes two people)  Press photographer wheel walker  Distance Ambulated (ft) 4 ft (2'x2)  Activity Response Tolerated fair  $Mobility charge 1 Mobility   Pt required minA to stand from recliner as well as verbal cues for hand placement and direction. +2 for safety with balance. Pt back to recliner after session with call bell and phone in reach. Family present in the room.   [facility] Leinaala Catanese Mobility Specialist Mobility Specialist 4 [place]: [phone] Mobility Specialist 2 [place] and [place]: [phone]

## 2021-03-28 NOTE — Progress Notes (Addendum)
°  Progress Note    03/28/2021 7:50 AM 2 Days Post-Op  Subjective:  appreciative to [place] for saving his life.  RN reports increased drainage in vac.   Pt served in [place] in the 90's.   Afebrile HR 90's-110's  110's-120's systolic 98% RA  Vitals:   03/27/21 2321 03/28/21 0435  BP: 120/71 120/71  Pulse: (!) 105 (!) 104  Resp: 14 20  Temp: 98.9 F (37.2 C) 98.6 F (37 C)  SpO2: 99% 98%    Physical Exam: Incisions:  limb guard in place.    CBC    Component Value Date/Time   WBC 9.5 03/28/2021 0212   RBC 2.61 (L) 03/28/2021 0212   HGB 7.7 (L) 03/28/2021 0212   HCT 22.3 (L) 03/28/2021 0212   PLT 260 03/28/2021 0212   MCV 85.4 03/28/2021 0212   MCH 29.5 03/28/2021 0212   MCHC 34.5 03/28/2021 0212   RDW 15.5 03/28/2021 0212   LYMPHSABS 1.7 03/09/2021 1724   MONOABS 0.9 03/09/2021 1724   EOSABS 0.1 03/09/2021 1724   BASOSABS 0.1 03/09/2021 1724    BMET    Component Value Date/Time   NA 136 03/28/2021 0212   K 3.6 03/28/2021 0212   CL 104 03/28/2021 0212   CO2 24 03/28/2021 0212   GLUCOSE 118 (H) 03/28/2021 0212   BUN 14 03/28/2021 0212   CREATININE 1.00 03/28/2021 0212   CALCIUM 9.0 03/28/2021 0212   GFRNONAA >60 03/28/2021 0212   GFRAA >60 07/06/2019 0407   GFRAA >60 07/06/2019 0407    INR    Component Value Date/Time   INR 1.1 03/26/2021 0555     Intake/Output Summary (Last 24 hours) at 03/28/2021 0750 Last data filed at 03/28/2021 0547 Gross per 24 hour  Intake 480 ml  Output 1800 ml  Net -1320 ml     Assessment/Plan:  66 y.o. male is s/p left below knee amputation  2 Days Post-Op   -limb guard in place-increased drainage in vac per RN.   -Will d/w Dr. [name] plan.   -acute blood loss anemia improved with one unit PRBC -PT/OT recommending CIR-consult pending   Doreatha Massed, PA-C [facility] [phone] 03/28/2021 7:50 AM   VASCULAR STAFF ADDENDUM: I have independently interviewed and examined the patient. I  agree with the above.  Dressing taken down. Stumps appear healthy. Re-dressed with dry dressing. Change daily.   [name]. [name], MD [facility] Phone Number: [phone] 03/28/2021 12:18 PM

## 2021-03-28 NOTE — Progress Notes (Addendum)
Patient left leg dressing found with copious amount of sanguinous drainage. Dressing changed. Gauze, abdominal pads, and compression wrap used. Patient tolerated well. H&H lab placed. MD notified.  Kenard Gower, RN

## 2021-03-28 NOTE — Plan of Care (Signed)
°  Problem: Education: Goal: Knowledge of General Education information will improve Description: Including pain rating scale, medication(s)/side effects and non-pharmacologic comfort measures Outcome: Progressing   Problem: Clinical Measurements: Goal: Ability to maintain clinical measurements within normal limits will improve Outcome: Progressing   Problem: Clinical Measurements: Goal: Cardiovascular complication will be avoided Outcome: Progressing   Problem: Clinical Measurements: Goal: Respiratory complications will improve Outcome: Progressing   Problem: Pain Managment: Goal: General experience of comfort will improve Outcome: Progressing   Problem: Safety: Goal: Ability to remain free from injury will improve Outcome: Progressing   Problem: Skin Integrity: Goal: Risk for impaired skin integrity will decrease Outcome: Progressing

## 2021-03-28 NOTE — Progress Notes (Signed)
Inpatient Rehab Admissions Coordinator:   I spoke with pt. Regarding potential CIR admit. He is interested once medically ready and states he has adequate support form his ex wife and brother. I will continue to follow for potential admit once medically ready.   Megan Salon, MS, CCC-SLP Rehab Admissions Coordinator  [phone] (celll) [phone] (office)

## 2021-03-28 NOTE — PMR Pre-admission (Signed)
PMR Admission Coordinator Pre-Admission Assessment  Patient: Adam Perez is an 66 y.o., male MRN: [id number] DOB: 02/05/1956 Height: 5' 7"  (170.2 cm) Weight: 102.1 kg  Insurance Information HMO:     PPO:      PCP:      IPA:      80/20: yes     OTHER:  PRIMARY: Medicare A and B       Policy#: [id number]      Subscriber: pt Phone#: Verified online    Fax#:  Pre-Cert#:       Employer:  Benefits:  Phone #:      Name:  Eff. Date: Parts A ad B effective  08/10/2018 Deduct: $1556      Out of Pocket Max:  None      Life Max: N/A  CIR: 100%      SNF: 100 days Outpatient: 80%     Co-Pay: 20% Home Health: 100%      Co-Pay: none DME: 80%     Co-Pay: 20% Providers: patient's choice SECONDARY: T[employer]       Policy#: [id number]     Phone#:   Financial Counselor:       Phone#:   The Data Collection Information Summary for patients in Inpatient Rehabilitation Facilities with attached Privacy Act [place] Records was provided and verbally reviewed with: Patient  Emergency Contact Information Contact Information     Name Relation Home Work Mobile   Pottle,[name] Spouse   [phone]       Current Medical History  Patient Admitting Diagnosis: Left BKA History of Present Illness:Adam Perez is a 66 year old right-handed male with history of diabetes mellitus, CVA with left-sided residual weakness, hypertension, PTSD, PVD with left popliteal angioplasty, left transmetatarsal amputation and left calf venous ulcer Sharp excisional debridement application of skin substitute 02/19/2021 receiving inpatient rehab services 02/23/2021 - 03/07/2021 .[facility] Kitchen  He was discharged home with dressing changes as indicated.  Per chart review patient currently living with his wife.  Used a wheelchair and walker prior to admission.  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 Stanford Breed.  Wound  VAC was applied.  Patient remains on aspirin and Plavix as prior to admission.  Acute on chronic anemia and monitored with latest hemoglobin 8.6.  He was cleared to begin subcutaneous heparin for DVT prophylaxis.  Therapy evaluations completed due to patient decreased functional mobility was admitted for a comprehensive rehab program.  Patient's medical record from [facility]  has been reviewed by the rehabilitation admission coordinator and physician.  Past Medical History  Past Medical History:  Diagnosis Date   Anemia    COVID 2022   mild case   Diabetes mellitus without complication (HCC)    Hemiparesis (HCC)    left side; residual from prior acute ischemic CVA   HTN (hypertension)    Ischemic cerebrovascular accident (CVA) ([place])    with residual left hemiparesis   PTSD (post-traumatic stress disorder)     Has the patient had major surgery during 100 days prior to admission? Yes  Family History   family history includes Diabetes in his brother and brother.  Current Medications  Current Facility-Administered Medications:    0.9 %  sodium chloride infusion, 250 mL, Intravenous, PRN, Baglia, Corrina, PA-C   acetaminophen (TYLENOL) tablet 325-650 mg, 325-650 mg, Oral, Q6H PRN, Baglia, Corrina, PA-C   alum & mag hydroxide-simeth (MAALOX/MYLANTA) 200-200-20 MG/5ML suspension  15-30 mL, 15-30 mL, Oral, Q2H PRN, Baglia, Corrina, PA-C   amLODipine (NORVASC) tablet 10 mg, 10 mg, Oral, Daily, Baglia, Corrina, PA-C   aspirin EC tablet 81 mg, 81 mg, Oral, Q breakfast, Baglia, Corrina, PA-C, 81 mg at 03/28/21 0805   atorvastatin (LIPITOR) tablet 40 mg, 40 mg, Oral, q1800, Baglia, Corrina, PA-C, 40 mg at 03/27/21 1656   baclofen (LIORESAL) tablet 10 mg, 10 mg, Oral, TID PRN, Baglia, Corrina, PA-C   bisacodyl (DULCOLAX) suppository 10 mg, 10 mg, Rectal, Daily PRN, Baglia, Corrina, PA-C   clopidogrel (PLAVIX) tablet 75 mg, 75 mg, Oral, Daily, Baglia, Corrina, PA-C, 75 mg at  03/28/21 0806   diphenhydrAMINE (BENADRYL) 12.5 MG/5ML elixir 12.5-25 mg, 12.5-25 mg, Oral, Q4H PRN, Baglia, Corrina, PA-C   docusate sodium (COLACE) capsule 100 mg, 100 mg, Oral, Daily, Baglia, Corrina, PA-C, 100 mg at 03/27/21 0859   guaiFENesin-dextromethorphan (ROBITUSSIN DM) 100-10 MG/5ML syrup 15 mL, 15 mL, Oral, Q4H PRN, Baglia, Corrina, PA-C   heparin injection 5,000 Units, 5,000 Units, Subcutaneous, Q8H, Baglia, Corrina, PA-C, 5,000 Units at 03/28/21 1515   hydrALAZINE (APRESOLINE) injection 5 mg, 5 mg, Intravenous, Q20 Min PRN, Baglia, Corrina, PA-C   lisinopril (ZESTRIL) tablet 20 mg, 20 mg, Oral, Daily **AND** hydrochlorothiazide (HYDRODIURIL) tablet 25 mg, 25 mg, Oral, Daily, Cherre Robins, MD   HYDROcodone-acetaminophen (NORCO) 7.5-325 MG per tablet 1-2 tablet, 1-2 tablet, Oral, Q4H PRN, Baglia, Corrina, PA-C, 2 tablet at 03/27/21 1958   HYDROcodone-acetaminophen (NORCO/VICODIN) 5-325 MG per tablet 1-2 tablet, 1-2 tablet, Oral, Q4H PRN, Baglia, Corrina, PA-C, 2 tablet at 03/28/21 1019   insulin aspart (novoLOG) injection 0-15 Units, 0-15 Units, Subcutaneous, TID WC, Baglia, Corrina, PA-C, 2 Units at 03/27/21 1658   labetalol (NORMODYNE) injection 10 mg, 10 mg, Intravenous, Q10 min PRN, Baglia, Corrina, PA-C   magnesium sulfate IVPB 2 g 50 mL, 2 g, Intravenous, Daily PRN, Baglia, Corrina, PA-C   metFORMIN (GLUCOPHAGE) tablet 500 mg, 500 mg, Oral, BID WC, Baglia, Corrina, PA-C, 500 mg at 03/28/21 1514   metoprolol tartrate (LOPRESSOR) injection 2-5 mg, 2-5 mg, Intravenous, Q2H PRN, Baglia, Corrina, PA-C   morphine 2 MG/ML injection 0.5-1 mg, 0.5-1 mg, Intravenous, Q2H PRN, Baglia, Corrina, PA-C   ondansetron (ZOFRAN) injection 4 mg, 4 mg, Intravenous, Q6H PRN, Baglia, Corrina, PA-C   pantoprazole (PROTONIX) EC tablet 40 mg, 40 mg, Oral, Daily, Baglia, Corrina, PA-C, 40 mg at 03/28/21 0805   phenol (CHLORASEPTIC) mouth spray 1 spray, 1 spray, Mouth/Throat, PRN, Baglia, Corrina, PA-C    potassium chloride SA (KLOR-CON M) CR tablet 20-40 mEq, 20-40 mEq, Oral, Daily PRN, Baglia, Corrina, PA-C   senna-docusate (Senokot-S) tablet 1 tablet, 1 tablet, Oral, QHS PRN, Baglia, Corrina, PA-C   sodium chloride flush (NS) 0.9 % injection 3 mL, 3 mL, Intravenous, Q12H, Baglia, Corrina, PA-C, 3 mL at 03/28/21 9476   sodium chloride flush (NS) 0.9 % injection 3 mL, 3 mL, Intravenous, PRN, Baglia, Corrina, PA-C   sodium phosphate (FLEET) 7-19 GM/118ML enema 1 enema, 1 enema, Rectal, Once PRN, Baglia, Corrina, PA-C  Patients Current Diet:  Diet Order             Diet heart healthy/carb modified Room service appropriate? Yes with Assist; Fluid consistency: Thin  Diet effective now                   Precautions / Restrictions Precautions Precautions: Fall, Other (comment) Precaution Comments: L residual limb wound vac; h/o L sided hemi/spasticity from prior CVA Other  Brace: L BKA limb protector Restrictions Weight Bearing Restrictions: Yes LLE Weight Bearing: Non weight bearing   Has the patient had 2 or more falls or a fall with injury in the past year? Yes  Prior Activity Level    Prior Functional Level Self Care: Did the patient need help bathing, dressing, using the toilet or eating? Needed some help  Indoor Mobility: Did the patient need assistance with walking from room to room (with or without device)? Needed some help  Stairs: Did the patient need assistance with internal or external stairs (with or without device)? Needed some help  Functional Cognition: Did the patient need help planning regular tasks such as shopping or remembering to take medications? Needed some help  Patient Information Are you of Hispanic, Latino/a,or Spanish origin?: A. No, not of Hispanic, Latino/a, or Spanish origin What is your race?: B. Black or African American Do you need or want an interpreter to communicate with a doctor or health care staff?: 0. No  Patient's Response To:   Health Literacy and Transportation Is the patient able to respond to health literacy and transportation needs?: Yes Health Literacy - How often do you need to have someone help you when you read instructions, pamphlets, or other written material from your doctor or pharmacy?: Never In the past 12 months, has lack of transportation kept you from medical appointments or from getting medications?: No In the past 12 months, has lack of transportation kept you from meetings, work, or from getting things needed for daily living?: No  Development worker, international aid / [place]: Wheelchair - manual, IT trainer scooter, Radio producer - quad, Radiation protection practitioner (4 wheels), Conservation officer, nature (2 wheels)  Prior Device Use: Indicate devices/aids used by the patient prior to current illness, exacerbation or injury? Walker  Current Functional Level Cognition  Overall Cognitive Status: History of cognitive impairments - at baseline Orientation Level: Oriented X4 General Comments: difficulty maintaining attention to task; internally distrated; easily able to redirect; verbose, perseverating on topics    Extremity Assessment (includes Sensation/Coordination)  Upper Extremity Assessment: LUE deficits/detail LUE Deficits / Details: residual hemiparesis; uses LUE as functional assist; holds in flexion but able to demonstrate isolated movements LUE Coordination: decreased fine motor, decreased gross motor  Lower Extremity Assessment: LLE deficits/detail LLE Deficits / Details: s/p L BKA; hip flex 3/5, knee flex limited by post-op pain weakness, demonstrates good ext and quad set    ADLs       Mobility  Overal bed mobility: Needs Assistance Bed Mobility: Supine to Sit Supine to sit: Min assist, HOB elevated General bed mobility comments: MinA for trunk elevation, use of bed rail    Transfers  Overall transfer level: Needs assistance Equipment used: None, Rolling walker (2 wheels) Transfers: Bed to  chair/wheelchair/BSC, Sit to/from Stand Sit to Stand: Mod assist, +2 safety/equipment Bed to/from chair/wheelchair/BSC transfer type:: Lateral/scoot transfer  Lateral/Scoot Transfers: Min assist Transfer via Lift Equipment:  (min A to CGA to power up) General transfer comment: initial lateral scoot from bed to drop arm recliner towards R-side, pt requiring assist for setup, cues for sequencing, minA for stability; performed stand from recliner requiring heavy modA+2 for trunk elevation and stability, especially on L-side    Ambulation / Gait / Stairs / Wheelchair Mobility  Ambulation/Gait General Gait Details: deferred secondary to fatigue with static standing trial    Posture / Balance Balance Overall balance assessment: Needs assistance Sitting balance-Leahy Scale: Fair Standing balance support: Bilateral upper extremity supported, Reliant on assistive device  for balance Standing balance-Leahy Scale: Poor Standing balance comment: reliant on BUE support and external assist    Special needs/care consideration Skin surgical site, with wound vac   Previous Home Environment (from acute therapy documentation) Living Arrangements: Spouse/significant other  Lives With: Alone Available Help at Discharge: Family, Available PRN/intermittently Type of Home: House Home Layout: One level Home Access: Level entry Bathroom Shower/Tub: Chiropodist: Standard Bathroom Accessibility: Yes How Accessible: Accessible via wheelchair Home Care Services: Yes Type of Home Care Services: [place] (if known): out of  Additional Comments: Pt lives alone but has family nearby if he needs help; reports ex-wife could likely move in at d/c if needed. Says the [place] is going to help him install a walk in shower.  Discharge Living Setting Plans for Discharge Living Setting: Patient's home Type of Home at Discharge: House Discharge Home Layout: One level Discharge  Home Access: Level entry Discharge Bathroom Shower/Tub: Tub/shower unit Discharge Bathroom Toilet: Standard Discharge Bathroom Accessibility: Yes How Accessible: Accessible via wheelchair, Accessible via walker Does the patient have any problems obtaining your medications?: No  Social/Family/Support Systems Patient Roles: Spouse, Other (Comment) Contact Information: [phone] Anticipated Caregiver: Deneise Lever (ex wife,, was caring for Pt. PTA) Discharge Plan Discussed with Primary Caregiver: Yes Is Caregiver In Agreement with Plan?: Yes  Goals Patient/Family Goal for Rehab: PT/OT Supervision Expected length of stay: 14-16 days Pt/Family Agrees to Admission and willing to participate: Yes Program Orientation Provided & Reviewed with Pt/Caregiver Including Roles  & Responsibilities: No  Decrease burden of Care through IP rehab admission: Specialzed equipment needs, Decrease number of caregivers, and Patient/family education  Possible need for SNF placement upon discharge: not anticipated   Patient Condition: I have reviewed medical records from [facility], spoken with CM, and patient. I met with patient at the bedside for inpatient rehabilitation assessment.  Patient will benefit from ongoing PT and OT, can actively participate in 3 hours of therapy a day 5 days of the week, and can make measurable gains during the admission.  Patient will also benefit from the coordinated team approach during an Inpatient Acute Rehabilitation admission.  The patient will receive intensive therapy as well as Rehabilitation physician, nursing, social worker, and care management interventions.  Due to safety, skin/wound care, disease management, medication administration, pain management, and patient education the patient requires 24 hour a day rehabilitation nursing.  The patient is currently min A with mobility and basic ADLs.  Discharge setting and therapy post discharge at home with home  health is anticipated.  Patient has agreed to participate in the Acute Inpatient Rehabilitation Program and will admit today.  Preadmission Screen Completed By:  Genella Mech, 03/28/2021 3:19 PM ______________________________________________________________________   Discussed status with Dr. Ranell Patrick  on 03/30/21 at 74 and received approval for admission today.  Admission Coordinator:  Genella Mech, CCC-SLP, time 1113/Date 03/30/21   Assessment/Plan: Diagnosis: L BKA Does the need for close, 24 hr/day Medical supervision in concert with the patient's rehab needs make it unreasonable for this patient to be served in a less intensive setting? 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 Yes Does the patient require coordinated care of a physician, rehab nurse, PT, OT to address physical and functional deficits in the context  of the above medical diagnosis(es)? Yes Addressing deficits in the following areas: balance, endurance, locomotion, strength, transferring, bowel/bladder control, bathing, dressing, feeding, grooming, toileting, and psychosocial support Can the patient actively participate in an intensive therapy program of at least 3 hrs of therapy 5 days a week? Yes The potential for patient to make measurable gains while on inpatient rehab is excellent Anticipated functional outcomes upon discharge from inpatient rehab: modified independent PT, modified independent OT, independent SLP Estimated rehab length of stay to reach the above functional goals is: 10-12 days Anticipated discharge destination: Home 10. Overall Rehab/Functional Prognosis: excellent   MD Signature: Leeroy Cha,  MD

## 2021-03-28 NOTE — Progress Notes (Signed)
Orthopedic Tech Progress Note Patient Details:  GRADEN SANDMEYER [birth year] [id number]  RN called requesting for a new SHRINKER/SOCK for patient BKA. So I called it into [facility] for 2 of those [facility]   Patient ID: [name], male   DOB: [birth year], 66 y.o.   MRN: [id number]  Janit Pagan 03/28/2021, 12:03 PM

## 2021-03-28 NOTE — Progress Notes (Signed)
Patient left leg draining copious amounts of sanguinous blood. MD notified. MD removed wound vac. Dressed with gauze, abdominal pads and compression wrap by MD. Patient tolerated well.  Kenard Gower, RN

## 2021-03-29 LAB — GLUCOSE, CAPILLARY
Glucose-Capillary: 127 mg/dL — ABNORMAL HIGH (ref 70–99)
Glucose-Capillary: 191 mg/dL — ABNORMAL HIGH (ref 70–99)
Glucose-Capillary: 124 mg/dL — ABNORMAL HIGH (ref 70–99)
Glucose-Capillary: 112 mg/dL — ABNORMAL HIGH (ref 70–99)

## 2021-03-29 LAB — CBC
HCT: 20.3 % — ABNORMAL LOW (ref 39.0–52.0)
Hemoglobin: 6.9 g/dL — CL (ref 13.0–17.0)
MCH: 29 pg (ref 26.0–34.0)
MCHC: 34 g/dL (ref 30.0–36.0)
MCV: 85.3 fL (ref 80.0–100.0)
Platelets: 328 10*3/uL (ref 150–400)
RBC: 2.38 MIL/uL — ABNORMAL LOW (ref 4.22–5.81)
RDW: 15.3 % (ref 11.5–15.5)
WBC: 13.5 10*3/uL — ABNORMAL HIGH (ref 4.0–10.5)
nRBC: 0 % (ref 0.0–0.2)

## 2021-03-29 LAB — HEMOGLOBIN AND HEMATOCRIT, BLOOD
HCT: 24.9 % — ABNORMAL LOW (ref 39.0–52.0)
Hemoglobin: 8.6 g/dL — ABNORMAL LOW (ref 13.0–17.0)

## 2021-03-29 LAB — PREPARE RBC (CROSSMATCH)

## 2021-03-29 MED ORDER — SODIUM CHLORIDE 0.9 % IV BOLUS
1000.0000 mL | Freq: Once | INTRAVENOUS | Status: AC
  Administered 2021-03-29: 1000 mL via INTRAVENOUS

## 2021-03-29 NOTE — Progress Notes (Signed)
Mobility Specialist: Progress Note   03/29/21 1556  Mobility  Bed Position Chair  Activity Transferred from chair to bed;Transferred from bed to chair  Level of Assistance Minimal assist, patient does 75% or more  Assistive Device Front wheel walker  Distance Ambulated (ft) 2 ft  Activity Response Tolerated well  $Mobility charge 1 Mobility   Pre-Mobility:    Seated: 103 HR, 97/69 (79) BP During-Mobility:    Standing: 120 HR, 127/83 (90) BP Post-Mobility: 114 HR, 98/67 (77) BP  Pt asymptomatic upon entering room and agreeable to stand at the [place]. BP while standing improved and pt remained asymptomatic so attempted pivot transfers from chair to bed and then back to the recliner, no c/o throughout. Pt back to chair with alarm on and call bell in reach.   [facility] Kailey Esquilin Mobility Specialist Mobility Specialist 4 [place]: [phone] Mobility Specialist 2 [place] and [place]: [phone]

## 2021-03-29 NOTE — Progress Notes (Signed)
Lab called nurse with HEM 6.9. MD notified Netta Corrigan, RN

## 2021-03-29 NOTE — Care Management Important Message (Signed)
Important Message  Patient Details  Name: Adam Perez MRN: [id number] Date of Birth: 26-Mar-1955   Medicare Important Message Given:  Yes     Renie Ora 03/29/2021, 7:26 AM

## 2021-03-29 NOTE — Progress Notes (Signed)
Physical Therapy Treatment Patient Details Name: Adam Perez MRN: [id number] DOB: 1955/12/13 Today's Date: 03/29/2021   History of Present Illness Pt is a 66 y.o. male with recent L transmetatarsal amputation (s/p 02/19/21; with d/c home from CIR on 03/07/21), now admitted 03/26/21 for L BKA and wound vac placement. Other PMH includes DM, HTN, CVA (residual L-side weakness), PTSD.   PT Comments    Pt progressing with mobility. Pt with symptomatic hypotension this session (MD/RN aware, giving fluids). Today's session focused on seated therex and brief bout of standing activity with RW; pt requiring modA, unable to clear foot in order to hop on RLE. Pt remains limited by generalized weakness, decreased activity tolerance, poor balance strategies/postural reactions and impaired cognition. Pt with improving attention, motivated to participate and regain independence. Continue to recommend intensive AIR-level therapies to maximize functional mobility and independence prior to return home.  Sitting - BP 84/59 Post-seated therex - BP 94/65 Post-standing activity - BP 79/53    Recommendations for follow up therapy are one component of a multi-disciplinary discharge planning process, led by the attending physician.  Recommendations may be updated based on patient status, additional functional criteria and insurance authorization.  Follow Up Recommendations  Acute inpatient rehab (3hours/day)     Assistance Recommended at Discharge Intermittent Supervision/Assistance  Patient can return home with the following A little help with walking and/or transfers;A little help with bathing/dressing/bathroom;Assistance with cooking/housework;Assist for transportation;Help with stairs or ramp for entrance   Equipment Recommendations   (TBD)    Recommendations for Other Services       Precautions / Restrictions Precautions Precautions: Fall;Other (comment) Precaution Comments: L residual limb wound vac;  h/o L sided hemi/spasticity from prior CVA Required Braces or Orthoses: Other Brace Other Brace: L BKA limb protector     Mobility  Bed Mobility               General bed mobility comments: Received sitting in recliner    Transfers Overall transfer level: Needs assistance Equipment used: Rolling walker (2 wheels) Transfers: Sit to/from Stand Sit to Stand: Min assist           General transfer comment: MinA for trunk elevation and stability to stand from recliner, heavy reliance on RUE support    Ambulation/Gait             Pre-gait activities: pt attempting to hop forwards/backwards with RW, difficulty clearing R foot from ground, modA for stability; further standing activity deferred secondary to symptomatic hypotension     Stairs             Wheelchair Mobility    Modified Rankin (Stroke Patients Only)       Balance Overall balance assessment: Needs assistance   Sitting balance-Leahy Scale: Fair     Standing balance support: Bilateral upper extremity supported, Reliant on assistive device for balance Standing balance-Leahy Scale: Poor Standing balance comment: reliant on BUE support and external assist                            Cognition Arousal/Alertness: Awake/alert Behavior During Therapy: WFL for tasks assessed/performed, Anxious Overall Cognitive Status: History of cognitive impairments - at baseline                                 General Comments: improving ability to attend to task, still requiring intermittent redirection  Exercises Amputee Exercises Gluteal Sets: Both, 10 reps, Seated Hip ABduction/ADduction: AROM, Left, 10 reps, Seated Knee Flexion: AROM, Left, Seated Knee Extension: AROM, Left, Seated Straight Leg Raises: AROM, Left, 10 reps, Seated (partial range with limb protector donned, difficult) Chair Push Up: Strengthening, 5 reps, Seated, Limitations Chair Push Up Limitations:  limited by h/o residual LUE weakness, heavy reliance on RUE/RLE and anterior lean in order to clear buttocks - more like a "preparing to stand" than an isolated chair push-up Other Exercises Other Exercises: Med[employer] HEP handout (Access Code [email or web address]) provided    General Comments General comments (skin integrity, edema, etc.): Pt reports limb protector off since increased bleeding/drainage from L BKA incision yesterday; pt required assist to don while seated in recliner. BP down to 79/53 post-standing activity      Pertinent Vitals/Pain Pain Assessment Pain Assessment: Faces Faces Pain Scale: Hurts little more Pain Location: L residual limb Pain Descriptors / Indicators: Discomfort, Guarding Pain Intervention(s): Monitored during session, Limited activity within patient's tolerance    Home Living                          Prior Function            PT Goals (current goals can now be found in the care plan section) Progress towards PT goals: Progressing toward goals    Frequency    Min 3X/week      PT Plan Current plan remains appropriate    Co-evaluation              AM-PAC PT "6 Clicks" Mobility   Outcome Measure  Help needed turning from your back to your side while in a flat bed without using bedrails?: A Little Help needed moving from lying on your back to sitting on the side of a flat bed without using bedrails?: A Little Help needed moving to and from a bed to a chair (including a wheelchair)?: A Lot Help needed standing up from a chair using your arms (e.g., wheelchair or bedside chair)?: A Little Help needed to walk in hospital room?: Total Help needed climbing 3-5 steps with a railing? : Total 6 Click Score: 13    End of Session   Activity Tolerance: Patient tolerated treatment well;Treatment limited secondary to medical complications (Comment) (symptomatic hypotension) Patient left: in chair;with call bell/phone within reach;with chair  alarm set Nurse Communication: Mobility status PT Visit Diagnosis: Other abnormalities of gait and mobility (R26.89);Muscle weakness (generalized) (M62.81);Pain Pain - Right/Left: Left Pain - part of body: Leg     Time: 1120-1150 PT Time Calculation (min) (ACUTE ONLY): 30 min  Charges:  $Therapeutic Exercise: 8-22 mins $Therapeutic Activity: 8-22 mins            Ina Homes, PT, DPT Acute Rehabilitation Services  Pager [phone] Office [phone]  Malachy Chamber 03/29/2021, 1:26 PM

## 2021-03-29 NOTE — Progress Notes (Signed)
Patient hypotensive and complaining of lightheadedness. RN notified vascular PA and was given an order for 1 L NS bolus. Will continue to monitor patient's symptoms and vital signs. Netta Corrigan, RN

## 2021-03-29 NOTE — Progress Notes (Signed)
[facility]  PROGRESS NOTE  ASSESSMENT / PLAN: TEIGEN BORELLO is a 66 y.o. 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 Incisional ooze yesterday required removal of prevena. Continue compression dressing to help reduce swelling. Mobilize with PT/OT. Would benefit from CIR.   SUBJECTIVE: No complaints. Pain well controlled. No strikethrough on bandage.  OBJECTIVE: BP 100/71 (BP Location: Right Arm)    Pulse (!) 104    Temp 98.3 F (36.8 C) (Oral)    Resp 19    Ht 5\' 7"  (1.702 m)    Wt 102.1 kg    SpO2 98%    BMI 35.24 kg/m   Intake/Output Summary (Last 24 hours) at 03/29/2021 0910 Last data filed at 03/29/2021 0818 Gross per 24 hour  Intake 600 ml  Output 750 ml  Net -150 ml    No distress Regular rate and rhythm Unlabored breathing Left leg bandage without strikethrough.  CBC Latest Ref Rng & Units 03/28/2021 03/28/2021 03/27/2021  WBC 4.0 - 10.5 K/uL - 9.5 -  Hemoglobin 13.0 - 17.0 g/dL 7.4(L) 7.7(L) 7.3(L)  Hematocrit 39.0 - 52.0 % 22.6(L) 22.3(L) 21.2(L)  Platelets 150 - 400 K/uL - 260 -     CMP Latest Ref Rng & Units 03/28/2021 03/27/2021 03/26/2021  Glucose 70 - 99 mg/dL 118(H) 116(H) 134(H)  BUN 8 - 23 mg/dL 14 17 19   Creatinine 0.61 - 1.24 mg/dL 1.00 1.09 1.22  Sodium 135 - 145 mmol/L 136 140 138  Potassium 3.5 - 5.1 mmol/L 3.6 4.1 3.8  Chloride 98 - 111 mmol/L 104 107 105  CO2 22 - 32 mmol/L 24 24 23   Calcium 8.9 - 10.3 mg/dL 9.0 9.1 9.1  Total Protein 6.5 - 8.1 g/dL - - 7.6  Total Bilirubin 0.3 - 1.2 mg/dL - - 0.4  Alkaline Phos 38 - 126 U/L - - 97  AST 15 - 41 U/L - - 20  ALT 0 - 44 U/L - - 24    Estimated Creatinine Clearance: 82.7 mL/min (by C-G formula based on SCr of 1 mg/dL).  Yevonne Aline. Stanford Breed, MD [facility] Phone Number: [phone] 03/29/2021 9:10 AM

## 2021-03-30 ENCOUNTER — Encounter: Payer: Medicare Other | Admitting: Registered Nurse

## 2021-03-30 ENCOUNTER — Inpatient Hospital Stay (HOSPITAL_COMMUNITY)
Admission: RE | Admit: 2021-03-30 | Discharge: 2021-04-09 | DRG: 560 | Disposition: A | Discharge status: Home / Self Care | Payer: Medicare Other | Source: Intra-hospital | Attending: Physical Medicine and Rehabilitation | Admitting: Physical Medicine and Rehabilitation

## 2021-03-30 ENCOUNTER — Other Ambulatory Visit: Payer: Self-pay

## 2021-03-30 ENCOUNTER — Encounter (HOSPITAL_COMMUNITY): Payer: Self-pay | Admitting: Physical Medicine and Rehabilitation

## 2021-03-30 DIAGNOSIS — Z794 Long term (current) use of insulin: Secondary | ICD-10-CM

## 2021-03-30 DIAGNOSIS — M25511 Pain in right shoulder: Secondary | ICD-10-CM | POA: Diagnosis not present

## 2021-03-30 DIAGNOSIS — Z89512 Acquired absence of left leg below knee: Secondary | ICD-10-CM

## 2021-03-30 DIAGNOSIS — Z79899 Other long term (current) drug therapy: Secondary | ICD-10-CM | POA: Diagnosis not present

## 2021-03-30 DIAGNOSIS — Z7902 Long term (current) use of antithrombotics/antiplatelets: Secondary | ICD-10-CM

## 2021-03-30 DIAGNOSIS — I69354 Hemiplegia and hemiparesis following cerebral infarction affecting left non-dominant side: Secondary | ICD-10-CM

## 2021-03-30 DIAGNOSIS — F4310 Post-traumatic stress disorder, unspecified: Secondary | ICD-10-CM | POA: Diagnosis present

## 2021-03-30 DIAGNOSIS — Z7984 Long term (current) use of oral hypoglycemic drugs: Secondary | ICD-10-CM | POA: Diagnosis not present

## 2021-03-30 DIAGNOSIS — E1142 Type 2 diabetes mellitus with diabetic polyneuropathy: Secondary | ICD-10-CM | POA: Diagnosis present

## 2021-03-30 DIAGNOSIS — G546 Phantom limb syndrome with pain: Secondary | ICD-10-CM | POA: Diagnosis present

## 2021-03-30 DIAGNOSIS — E1151 Type 2 diabetes mellitus with diabetic peripheral angiopathy without gangrene: Secondary | ICD-10-CM | POA: Diagnosis present

## 2021-03-30 DIAGNOSIS — D649 Anemia, unspecified: Secondary | ICD-10-CM | POA: Diagnosis present

## 2021-03-30 DIAGNOSIS — Z8616 Personal history of COVID-19: Secondary | ICD-10-CM

## 2021-03-30 DIAGNOSIS — M62838 Other muscle spasm: Secondary | ICD-10-CM | POA: Diagnosis not present

## 2021-03-30 DIAGNOSIS — Z833 Family history of diabetes mellitus: Secondary | ICD-10-CM | POA: Diagnosis not present

## 2021-03-30 DIAGNOSIS — Z7982 Long term (current) use of aspirin: Secondary | ICD-10-CM

## 2021-03-30 DIAGNOSIS — Z4781 Encounter for orthopedic aftercare following surgical amputation: Secondary | ICD-10-CM | POA: Diagnosis present

## 2021-03-30 DIAGNOSIS — I739 Peripheral vascular disease, unspecified: Secondary | ICD-10-CM

## 2021-03-30 DIAGNOSIS — I10 Essential (primary) hypertension: Secondary | ICD-10-CM | POA: Diagnosis present

## 2021-03-30 DIAGNOSIS — E1169 Type 2 diabetes mellitus with other specified complication: Secondary | ICD-10-CM | POA: Diagnosis not present

## 2021-03-30 DIAGNOSIS — Z6831 Body mass index (BMI) 31.0-31.9, adult: Secondary | ICD-10-CM | POA: Diagnosis not present

## 2021-03-30 DIAGNOSIS — E669 Obesity, unspecified: Secondary | ICD-10-CM | POA: Diagnosis present

## 2021-03-30 DIAGNOSIS — Z741 Need for assistance with personal care: Secondary | ICD-10-CM | POA: Diagnosis present

## 2021-03-30 DIAGNOSIS — R609 Edema, unspecified: Secondary | ICD-10-CM | POA: Diagnosis present

## 2021-03-30 DIAGNOSIS — E785 Hyperlipidemia, unspecified: Secondary | ICD-10-CM | POA: Diagnosis present

## 2021-03-30 DIAGNOSIS — R7401 Elevation of levels of liver transaminase levels: Secondary | ICD-10-CM | POA: Diagnosis not present

## 2021-03-30 LAB — TYPE AND SCREEN
ABO/RH(D): O POS
Antibody Screen: NEGATIVE
Unit division: 0
Unit division: 0

## 2021-03-30 LAB — CBC
HCT: 25.1 % — ABNORMAL LOW (ref 39.0–52.0)
Hemoglobin: 8.5 g/dL — ABNORMAL LOW (ref 13.0–17.0)
MCH: 28.9 pg (ref 26.0–34.0)
MCHC: 33.9 g/dL (ref 30.0–36.0)
MCV: 85.4 fL (ref 80.0–100.0)
Platelets: 290 10*3/uL (ref 150–400)
RBC: 2.94 MIL/uL — ABNORMAL LOW (ref 4.22–5.81)
RDW: 15.2 % (ref 11.5–15.5)
WBC: 10.2 10*3/uL (ref 4.0–10.5)
nRBC: 0 % (ref 0.0–0.2)

## 2021-03-30 LAB — BPAM RBC
Blood Product Expiration Date: 202302112359
Blood Product Expiration Date: 202302142359
ISSUE DATE / TIME: 202301191716
ISSUE DATE / TIME: 202301191841
Unit Type and Rh: 5100
Unit Type and Rh: 5100

## 2021-03-30 LAB — GLUCOSE, CAPILLARY
Glucose-Capillary: 111 mg/dL — ABNORMAL HIGH (ref 70–99)
Glucose-Capillary: 137 mg/dL — ABNORMAL HIGH (ref 70–99)
Glucose-Capillary: 120 mg/dL — ABNORMAL HIGH (ref 70–99)
Glucose-Capillary: 103 mg/dL — ABNORMAL HIGH (ref 70–99)

## 2021-03-30 MED ORDER — HEPARIN SODIUM (PORCINE) 5000 UNIT/ML IJ SOLN
5000.0000 [IU] | Freq: Three times a day (TID) | INTRAMUSCULAR | Status: DC
  Administered 2021-03-30 – 2021-04-09 (×28): 5000 [IU] via SUBCUTANEOUS
  Filled 2021-03-30 (×25): qty 1

## 2021-03-30 MED ORDER — METFORMIN HCL 850 MG PO TABS
850.0000 mg | ORAL_TABLET | Freq: Two times a day (BID) | ORAL | Status: DC
  Administered 2021-03-31 – 2021-04-03 (×7): 850 mg via ORAL
  Filled 2021-03-30 (×7): qty 1

## 2021-03-30 MED ORDER — PANTOPRAZOLE SODIUM 40 MG PO TBEC
40.0000 mg | DELAYED_RELEASE_TABLET | Freq: Every day | ORAL | Status: DC
  Administered 2021-03-31 – 2021-04-09 (×10): 40 mg via ORAL
  Filled 2021-03-30 (×10): qty 1

## 2021-03-30 MED ORDER — CLOPIDOGREL BISULFATE 75 MG PO TABS
75.0000 mg | ORAL_TABLET | Freq: Every day | ORAL | Status: DC
  Administered 2021-03-31 – 2021-04-09 (×10): 75 mg via ORAL
  Filled 2021-03-30 (×10): qty 1

## 2021-03-30 MED ORDER — ASPIRIN EC 81 MG PO TBEC
81.0000 mg | DELAYED_RELEASE_TABLET | Freq: Every day | ORAL | Status: DC
  Administered 2021-03-31 – 2021-04-09 (×10): 81 mg via ORAL
  Filled 2021-03-30 (×10): qty 1

## 2021-03-30 MED ORDER — METFORMIN HCL 500 MG PO TABS
500.0000 mg | ORAL_TABLET | Freq: Two times a day (BID) | ORAL | Status: DC
Start: 2021-03-30 — End: 2021-03-30
  Administered 2021-03-30: 500 mg via ORAL
  Filled 2021-03-30: qty 1

## 2021-03-30 MED ORDER — DOCUSATE SODIUM 100 MG PO CAPS
100.0000 mg | ORAL_CAPSULE | Freq: Every day | ORAL | Status: DC
  Administered 2021-03-31 – 2021-04-09 (×10): 100 mg via ORAL
  Filled 2021-03-30 (×10): qty 1

## 2021-03-30 MED ORDER — AMLODIPINE BESYLATE 10 MG PO TABS: 10.0000 mg | ORAL_TABLET | Freq: Every day | ORAL | Status: DC

## 2021-03-30 MED ORDER — BISACODYL 10 MG RE SUPP: 10.0000 mg | Freq: Every day | RECTAL | Status: DC | PRN

## 2021-03-30 MED ORDER — ACETAMINOPHEN 325 MG PO TABS: 325.0000 mg | ORAL_TABLET | Freq: Four times a day (QID) | ORAL | Status: DC | PRN

## 2021-03-30 MED ORDER — BACLOFEN 10 MG PO TABS: 10.0000 mg | ORAL_TABLET | Freq: Three times a day (TID) | ORAL | Status: DC | PRN

## 2021-03-30 MED ORDER — INSULIN ASPART 100 UNIT/ML IJ SOLN
0.0000 [IU] | Freq: Three times a day (TID) | INTRAMUSCULAR | Status: DC
  Administered 2021-03-31 – 2021-04-01 (×2): 2 [IU] via SUBCUTANEOUS

## 2021-03-30 MED ORDER — SODIUM CHLORIDE 0.9 % IV SOLN: INTRAVENOUS | Status: DC

## 2021-03-30 MED ORDER — HEPARIN SODIUM (PORCINE) 5000 UNIT/ML IJ SOLN: 5000.0000 [IU] | Freq: Three times a day (TID) | INTRAMUSCULAR | Status: DC

## 2021-03-30 MED ORDER — HYDROCODONE-ACETAMINOPHEN 5-325 MG PO TABS
1.0000 | ORAL_TABLET | ORAL | Status: DC | PRN
  Administered 2021-03-30 – 2021-04-09 (×12): 2 via ORAL
  Filled 2021-03-30 (×12): qty 2

## 2021-03-30 MED ORDER — AMLODIPINE BESYLATE 5 MG PO TABS
5.0000 mg | ORAL_TABLET | Freq: Every day | ORAL | Status: DC
  Administered 2021-03-31 – 2021-04-09 (×10): 5 mg via ORAL
  Filled 2021-03-30 (×10): qty 1

## 2021-03-30 MED ORDER — ATORVASTATIN CALCIUM 40 MG PO TABS
40.0000 mg | ORAL_TABLET | Freq: Every day | ORAL | Status: DC
  Administered 2021-03-30 – 2021-04-08 (×10): 40 mg via ORAL
  Filled 2021-03-30 (×10): qty 1

## 2021-03-30 NOTE — H&P (Signed)
Physical Medicine and Rehabilitation Admission H&P  CC: L BKA  HPI: Adam Perez is a 66 year old right-handed male with history of diabetes mellitus, CVA with left-sided residual weakness, hypertension, PTSD, PVD with left popliteal angioplasty, left transmetatarsal amputation and left calf venous ulcer Sharp excisional debridement application of skin substitute 02/19/2021 receiving inpatient rehab services 02/23/2021 - 03/07/2021 .Marland Kitchen  He was discharged home with dressing changes as indicated.  Per chart review patient currently living with his wife.  Used a wheelchair and walker prior to admission.  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 Stanford Breed.  Wound VAC was applied.  Patient remains on aspirin and Plavix as prior to admission.  Acute on chronic anemia and monitored with latest hemoglobin 8.6.  He was cleared to begin subcutaneous heparin for DVT prophylaxis.  Therapy evaluations completed due to patient decreased functional mobility was admitted for a comprehensive rehab program. Was feeling lightheaded yesterday, but better today.  Review of Systems  Constitutional:  Negative for chills and fever.  HENT:  Negative for hearing loss.   Eyes:  Negative for blurred vision and double vision.  Respiratory:  Negative for cough and shortness of breath.   Cardiovascular:  Positive for leg swelling. Negative for chest pain and palpitations.  Gastrointestinal:  Positive for constipation. Negative for heartburn, nausea and vomiting.  Genitourinary:  Negative for dysuria, flank pain and hematuria.  Musculoskeletal:  Positive for myalgias.  Skin:  Negative for rash.  Psychiatric/Behavioral:         PTSD  All other systems reviewed and are negative. Past Medical History:  Diagnosis Date   Anemia    COVID 2022   mild case   Diabetes mellitus without complication (HCC)     Hemiparesis (HCC)    left side; residual from prior acute ischemic CVA   HTN (hypertension)    Ischemic cerebrovascular accident (CVA) ([place])    with residual left hemiparesis   PTSD (post-traumatic stress disorder)    Past Surgical History:  Procedure Laterality Date   AMPUTATION Left 03/26/2021   Procedure: LEFT BELOW KNEE AMPUTATION;  Surgeon: Cherre Robins, MD;  Location: [place];  Service: Vascular;  Laterality: Left;   ANGIOPLASTY Left 02/19/2021   Procedure: POPLITEAL ANGIOPLASTY;  Surgeon: Cherre Robins, MD;  Location: [place];  Service: Vascular;  Laterality: Left;   APPLICATION OF WOUND VAC Left 03/26/2021   Procedure: APPLICATION OF INCISIONAL WOUND [place];  Surgeon: Cherre Robins, MD;  Location: [place];  Service: Vascular;  Laterality: Left;   LOWER EXTREMITY ANGIOGRAM Left 02/19/2021   Procedure: LEFT LOWER EXTREMITY ANGIOGRAM;  Surgeon: Cherre Robins, MD;  Location: [place];  Service: Vascular;  Laterality: Left;   LOWER EXTREMITY ANGIOGRAPHY N/A 02/15/2021   Procedure: LOWER EXTREMITY ANGIOGRAPHY;  Surgeon: Cherre Robins, MD;  Location: [place] [facility];  Service: Cardiovascular;  Laterality: N/A;   TRANSMETATARSAL AMPUTATION Left 02/19/2021   Procedure: LEFT TRANSMETATARSAL AMPUTATION;  Surgeon: Cherre Robins, MD;  Location: [place];  Service: Vascular;  Laterality: Left;   ULTRASOUND GUIDANCE FOR VASCULAR ACCESS Right 02/19/2021   Procedure: ULTRASOUND GUIDANCE FOR RIGHT COMMON FEMORAL ARTERY ACCESS;  Surgeon: Cherre Robins, MD;  Location: [place];  Service: Vascular;  Laterality: Right;   WOUND DEBRIDEMENT Left 02/19/2021   Procedure: LEFT CALF VENOUS ULCER SHARP EXCISIONAL DEBRIDEMENT;  Surgeon: Cherre Robins, MD;  Location: [place];  Service: Vascular;  Laterality: Left;   Family History  Problem Relation Age of Onset   Diabetes Brother    Diabetes Brother    Social History:  reports that he has never smoked. He has never used smokeless tobacco. He reports  that he does not drink alcohol and does not use drugs. Allergies: No Known Allergies Medications Prior to Admission  Medication Sig Dispense Refill   amLODipine (NORVASC) 10 MG tablet Take 1 tablet (10 mg total) by mouth daily. 30 tablet 0   aspirin EC 81 MG tablet Take 1 tablet (81 mg total) by mouth daily with breakfast. 120 tablet 3   atorvastatin (LIPITOR) 40 MG tablet Take 1 tablet (40 mg total) by mouth daily. (Patient taking differently: Take 40 mg by mouth daily at 6 PM.) 30 tablet 0   baclofen (LIORESAL) 10 MG tablet Take 10 mg by mouth 3 (three) times daily as needed for muscle spasms.     clopidogrel (PLAVIX) 75 MG tablet Take 1 tablet (75 mg total) by mouth daily. 30 tablet 0   insulin glargine (LANTUS) 100 UNIT/ML Solostar Pen Inject 10 Units into the skin at bedtime. (Patient not taking: Reported on 03/23/2021) 15 mL 11   insulin glargine (LANTUS) 100 UNIT/ML Solostar Pen Inject 10 Units into the skin daily. 15 mL 11   lisinopril-hydrochlorothiazide (ZESTORETIC) 20-25 MG tablet Take 1 tablet by mouth daily.     lurasidone (LATUDA) 20 MG TABS tablet Take 1 tablet (20 mg total) by mouth daily. (Patient not taking: Reported on 03/23/2021) 30 tablet 0   metFORMIN (GLUCOPHAGE) 500 MG tablet Take 500 mg by mouth 2 (two) times daily with a meal.     methocarbamol (ROBAXIN) 500 MG tablet Take 1 tablet (500 mg total) by mouth every 6 (six) hours as needed for muscle spasms. (Patient not taking: Reported on 03/23/2021) 60 tablet 0   Multiple Vitamin (MULTIVITAMIN WITH MINERALS) TABS tablet Take 1 tablet by mouth daily. (Patient not taking: Reported on 03/23/2021)     oxyCODONE-acetaminophen (PERCOCET/ROXICET) 5-325 MG tablet Take 1-2 tablets by mouth every 4 (four) hours as needed for moderate pain. (Patient not taking: Reported on 03/23/2021) 30 tablet 0   polyethylene glycol (MIRALAX / GLYCOLAX) 17 g packet Take 17 g by mouth 2 (two) times daily as needed for mild constipation. 14 each 0    senna-docusate (SENOKOT-S) 8.6-50 MG tablet Take 2 tablets by mouth at bedtime. (Patient not taking: Reported on 03/23/2021)     sertraline (ZOLOFT) 100 MG tablet Take 1.5 tablets (150 mg total) by mouth daily. (Patient not taking: Reported on 03/23/2021) 45 tablet 0   traZODone (DESYREL) 100 MG tablet Take 1 tablet (100 mg total) by mouth at bedtime. (Patient taking differently: Take 100 mg by mouth at bedtime as needed for sleep.) 30 tablet 0    Drug Regimen Review Drug regimen was reviewed and remains appropriate with no significant issues identified  Home: Home Living Family/patient expects to be discharged to:: Private residence Living Arrangements: Spouse/significant other Available Help at Discharge: Family, Available PRN/intermittently Type of Home: House Home Access: Level entry Home Layout: One level Bathroom Shower/Tub: Chiropodist: Standard Bathroom Accessibility: Yes Home Equipment: Wheelchair - manual, Transport planner, Radio producer - quad, Radiation protection practitioner (4 wheels), Conservation officer, nature (2 wheels) Additional Comments: Pt lives alone but has family nearby if he needs help; reports ex-wife could likely move in at d/c if needed. Says the [place] is going to help him install a walk in shower.  Lives With: Alone   Functional History: Prior Function Prior Level of Function : Independent/Modified Independent, Driving Mobility Comments: Difficult historian - since recent d/c home from [facility] s/p L transmet amputation, pt typically using w/c, some standing and walking with darco shoe and assist ADLs Comments: Difficult historian - prior to admission for L transmet amputation, pt independent with ADL/iADLs and driving; since recent d/c home from [facility], requiring assist from ex-wife for thsi   Functional Status:  Mobility: Bed Mobility Overal bed mobility: Needs Assistance Bed Mobility: Supine to Sit Supine to sit: Min assist, HOB elevated General bed mobility comments: Received sitting in  recliner Transfers Overall transfer level: Needs assistance Equipment used: Rolling walker (2 wheels) Transfers: Sit to/from Stand Sit to Stand: Min assist Bed to/from chair/wheelchair/BSC transfer type:: Lateral/scoot transfer  Lateral/Scoot Transfers: Personal assistant:  (min A to CGA to power up) General transfer comment: MinA for trunk elevation and stability to stand from recliner, heavy reliance on RUE support Ambulation/Gait General Gait Details: deferred secondary to fatigue with static standing trial Pre-gait activities: pt attempting to hop forwards/backwards with RW, difficulty clearing R foot from ground, modA for stability; further standing activity deferred secondary to symptomatic hypotension   ADL:   Cognition: Cognition Overall Cognitive Status: History of cognitive impairments - at baseline Orientation Level: Oriented X4, Oriented to person, Oriented to place, Oriented to time, Oriented to situation Cognition Arousal/Alertness: Awake/alert Behavior During Therapy: WFL for tasks assessed/performed, Anxious Overall Cognitive Status: History of cognitive impairments - at baseline General Comments: improving ability to attend to task, still requiring intermittent redirection  Physical Exam: Blood pressure 99/64, pulse 89, temperature 98.3 F (36.8 C), temperature source Oral, resp. rate 17, height 5\' 7"  (1.702 m), weight 91.1 kg, SpO2 100 %. Gen: no distress, normal appearing HEENT: oral mucosa pink and moist, NCAT Cardio: Reg rate Chest: normal effort, normal rate of breathing Abd: soft, non-distended Ext: no edema Psych: pleasant, normal affect Skin:    Comments: Left BKA site with limb guard protector as well as wound VAC.  Neurological:     Comments: Patient is alert and a bit anxious.  Makes eye contact with examiner.  He does recall his latest CIR admission.  Oriented x3.   Results for orders placed or performed during the hospital  encounter of 03/30/21 (from the past 48 hour(s))  Glucose, capillary     Status: Abnormal   Collection Time: 03/30/21  5:49 PM  Result Value Ref Range   Glucose-Capillary 120 (H) 70 - 99 mg/dL    Comment: Glucose reference range applies only to samples taken after fasting for at least 8 hours.   No results found.     Medical Problem List and Plan: 1. 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   -ELOS/Goals: 10-14 days S  Admit to CIR 2.  Antithrombotics: -DVT/anticoagulation:  Pharmaceutical: Heparin  -antiplatelet therapy: Aspirin 81 mg daily and Plavix 75 mg daily 3. Pain Management: Baclofen 10 mg 3 times daily as needed, hydrocodone as needed 4. Mood/PTSD: Provide emotional support  -antipsychotic agents: N/A 5. Neuropsych: This patient is capable of making decisions on his own behalf. 6. Skin/Wound Care: Routine skin checks 7. Fluids/Electrolytes/Nutrition: Routine in and outs with follow-up chemistries 8.  Acute on chronic anemia.  Follow-up CBC 9.  Hypertension.  Norvasc 10 mg daily.  Monitor with increased mobility 10.  Diabetes mellitus with peripheral  neuropathy.  Latest hemoglobin A1c 6.1.  Increase Glucophage 850 mg twice daily.  Check blood sugars before meals and at bedtime 11.  Hyperlipidemia.  Continue Lipitor 12.  History of CVA with left-sided residual weakness.  Continue aspirin  I have personally performed a face to face diagnostic evaluation, including, but not limited to relevant history and physical exam findings, of this patient and developed relevant assessment and plan.  Additionally, I have reviewed and concur with the physician assistant's documentation above.  Leeroy Cha, MD  Lavon Paganini Johnson, PA-C 03/30/2021

## 2021-03-30 NOTE — H&P (Signed)
Physical Medicine and Rehabilitation Admission H&P     HPI: Adam Sheard. Suderman is a 66 year old right-handed male with history of diabetes mellitus, CVA with left-sided residual weakness, hypertension, PTSD, PVD with left popliteal angioplasty, left transmetatarsal amputation and left calf venous ulcer Sharp excisional debridement application of skin substitute 02/19/2021 receiving inpatient rehab services 02/23/2021 - 03/07/2021 .Marland Kitchen  He was discharged home with dressing changes as indicated.  Per chart review patient currently living with his wife.  Used a wheelchair and walker prior to admission.  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 Stanford Breed.  Wound VAC was applied.  Patient remains on aspirin and Plavix as prior to admission.  Acute on chronic anemia and monitored with latest hemoglobin 8.6.  He was cleared to begin subcutaneous heparin for DVT prophylaxis.  Therapy evaluations completed due to patient decreased functional mobility was admitted for a comprehensive rehab program. Was feeling lightheaded yesterday, but better today.  Review of Systems  Constitutional:  Negative for chills and fever.  HENT:  Negative for hearing loss.   Eyes:  Negative for blurred vision and double vision.  Respiratory:  Negative for cough and shortness of breath.   Cardiovascular:  Positive for leg swelling. Negative for chest pain and palpitations.  Gastrointestinal:  Positive for constipation. Negative for heartburn, nausea and vomiting.  Genitourinary:  Negative for dysuria, flank pain and hematuria.  Musculoskeletal:  Positive for myalgias.  Skin:  Negative for rash.  Psychiatric/Behavioral:         PTSD  All other systems reviewed and are negative. Past Medical History:  Diagnosis Date   Anemia    COVID 2022   mild case   Diabetes mellitus without complication (HCC)    Hemiparesis  (HCC)    left side; residual from prior acute ischemic CVA   HTN (hypertension)    Ischemic cerebrovascular accident (CVA) ([place])    with residual left hemiparesis   PTSD (post-traumatic stress disorder)    Past Surgical History:  Procedure Laterality Date   AMPUTATION Left 03/26/2021   Procedure: LEFT BELOW KNEE AMPUTATION;  Surgeon: Cherre Robins, MD;  Location: [facility];  Service: Vascular;  Laterality: Left;   ANGIOPLASTY Left 02/19/2021   Procedure: POPLITEAL ANGIOPLASTY;  Surgeon: Cherre Robins, MD;  Location: [facility];  Service: Vascular;  Laterality: Left;   APPLICATION OF WOUND VAC Left 03/26/2021   Procedure: APPLICATION OF INCISIONAL WOUND [place];  Surgeon: Cherre Robins, MD;  Location: [facility];  Service: Vascular;  Laterality: Left;   LOWER EXTREMITY ANGIOGRAM Left 02/19/2021   Procedure: LEFT LOWER EXTREMITY ANGIOGRAM;  Surgeon: Cherre Robins, MD;  Location: [facility];  Service: Vascular;  Laterality: Left;   LOWER EXTREMITY ANGIOGRAPHY N/A 02/15/2021   Procedure: LOWER EXTREMITY ANGIOGRAPHY;  Surgeon: Cherre Robins, MD;  Location: [facility];  Service: Cardiovascular;  Laterality: N/A;   TRANSMETATARSAL AMPUTATION Left 02/19/2021   Procedure: LEFT TRANSMETATARSAL AMPUTATION;  Surgeon: Cherre Robins, MD;  Location: [facility];  Service: Vascular;  Laterality: Left;   ULTRASOUND GUIDANCE FOR VASCULAR ACCESS Right 02/19/2021   Procedure: ULTRASOUND GUIDANCE FOR RIGHT COMMON FEMORAL ARTERY ACCESS;  Surgeon: Cherre Robins, MD;  Location: [facility];  Service: Vascular;  Laterality: Right;   WOUND DEBRIDEMENT Left 02/19/2021   Procedure: LEFT CALF VENOUS ULCER SHARP EXCISIONAL DEBRIDEMENT;  Surgeon: Cherre Robins, MD;  Location: [facility];  Service:  Vascular;  Laterality: Left;   Family History  Problem Relation Age of Onset   Diabetes Brother    Diabetes Brother    Social History:  reports that he has never smoked. He has never used smokeless tobacco. He reports that he does  not drink alcohol and does not use drugs. Allergies: No Known Allergies Medications Prior to Admission  Medication Sig Dispense Refill   amLODipine (NORVASC) 10 MG tablet Take 1 tablet (10 mg total) by mouth daily. 30 tablet 0   aspirin EC 81 MG tablet Take 1 tablet (81 mg total) by mouth daily with breakfast. 120 tablet 3   atorvastatin (LIPITOR) 40 MG tablet Take 1 tablet (40 mg total) by mouth daily. (Patient taking differently: Take 40 mg by mouth daily at 6 PM.) 30 tablet 0   baclofen (LIORESAL) 10 MG tablet Take 10 mg by mouth 3 (three) times daily as needed for muscle spasms.     insulin glargine (LANTUS) 100 UNIT/ML Solostar Pen Inject 10 Units into the skin daily. 15 mL 11   lisinopril-hydrochlorothiazide (ZESTORETIC) 20-25 MG tablet Take 1 tablet by mouth daily.     metFORMIN (GLUCOPHAGE) 500 MG tablet Take 500 mg by mouth 2 (two) times daily with a meal.     polyethylene glycol (MIRALAX / GLYCOLAX) 17 g packet Take 17 g by mouth 2 (two) times daily as needed for mild constipation. 14 each 0   clopidogrel (PLAVIX) 75 MG tablet Take 1 tablet (75 mg total) by mouth daily. 30 tablet 0   insulin glargine (LANTUS) 100 UNIT/ML Solostar Pen Inject 10 Units into the skin at bedtime. (Patient not taking: Reported on 03/23/2021) 15 mL 11   lurasidone (LATUDA) 20 MG TABS tablet Take 1 tablet (20 mg total) by mouth daily. (Patient not taking: Reported on 03/23/2021) 30 tablet 0   methocarbamol (ROBAXIN) 500 MG tablet Take 1 tablet (500 mg total) by mouth every 6 (six) hours as needed for muscle spasms. (Patient not taking: Reported on 03/23/2021) 60 tablet 0   Multiple Vitamin (MULTIVITAMIN WITH MINERALS) TABS tablet Take 1 tablet by mouth daily. (Patient not taking: Reported on 03/23/2021)     oxyCODONE-acetaminophen (PERCOCET/ROXICET) 5-325 MG tablet Take 1-2 tablets by mouth every 4 (four) hours as needed for moderate pain. (Patient not taking: Reported on 03/23/2021) 30 tablet 0   senna-docusate  (SENOKOT-S) 8.6-50 MG tablet Take 2 tablets by mouth at bedtime. (Patient not taking: Reported on 03/23/2021)     sertraline (ZOLOFT) 100 MG tablet Take 1.5 tablets (150 mg total) by mouth daily. (Patient not taking: Reported on 03/23/2021) 45 tablet 0   traZODone (DESYREL) 100 MG tablet Take 1 tablet (100 mg total) by mouth at bedtime. (Patient taking differently: Take 100 mg by mouth at bedtime as needed for sleep.) 30 tablet 0    Drug Regimen Review Drug regimen was reviewed and remains appropriate with no significant issues identified  Home: Home Living Family/patient expects to be discharged to:: Private residence Living Arrangements: Spouse/significant other Available Help at Discharge: Family, Available PRN/intermittently Type of Home: House Home Access: Level entry Home Layout: One level Bathroom Shower/Tub: Chiropodist: Standard Bathroom Accessibility: Yes Home Equipment: Wheelchair - manual, Transport planner, Radio producer - quad, Radiation protection practitioner (4 wheels), Conservation officer, nature (2 wheels) Additional Comments: Pt lives alone but has family nearby if he needs help; reports ex-wife could likely move in at d/c if needed. Says the [facility] is going to help him install a walk in shower.  Lives With: Alone   Functional History: Prior Function Prior Level of Function : Independent/Modified Independent, Driving Mobility Comments: Difficult historian - since recent d/c home from [facility] s/p L transmet amputation, pt typically using w/c, some standing and walking with darco shoe and assist ADLs Comments: Difficult historian - prior to admission for L transmet amputation, pt independent with ADL/iADLs and driving; since recent d/c home from [facility], requiring assist from ex-wife for thsi  Functional Status:  Mobility: Bed Mobility Overal bed mobility: Needs Assistance Bed Mobility: Supine to Sit Supine to sit: Min assist, HOB elevated General bed mobility comments: Received sitting in  recliner Transfers Overall transfer level: Needs assistance Equipment used: Rolling walker (2 wheels) Transfers: Sit to/from Stand Sit to Stand: Min assist Bed to/from chair/wheelchair/BSC transfer type:: Lateral/scoot transfer  Lateral/Scoot Transfers: Personal assistant:  (min A to CGA to power up) General transfer comment: MinA for trunk elevation and stability to stand from recliner, heavy reliance on RUE support Ambulation/Gait General Gait Details: deferred secondary to fatigue with static standing trial Pre-gait activities: pt attempting to hop forwards/backwards with RW, difficulty clearing R foot from ground, modA for stability; further standing activity deferred secondary to symptomatic hypotension    ADL:    Cognition: Cognition Overall Cognitive Status: History of cognitive impairments - at baseline Orientation Level: Oriented X4, Oriented to person, Oriented to place, Oriented to time, Oriented to situation Cognition Arousal/Alertness: Awake/alert Behavior During Therapy: WFL for tasks assessed/performed, Anxious Overall Cognitive Status: History of cognitive impairments - at baseline General Comments: improving ability to attend to task, still requiring intermittent redirection  Physical Exam: Blood pressure 112/77, pulse 89, temperature (!) 97.2 F (36.2 C), temperature source Oral, resp. rate 20, height 5\' 7"  (1.702 m), weight 102.1 kg, SpO2 98 %. Gen: no distress, normal appearing HEENT: oral mucosa pink and moist, NCAT Cardio: Reg rate Chest: normal effort, normal rate of breathing Abd: soft, non-distended Ext: no edema Psych: pleasant, normal affect Skin:    Comments: Left BKA site with limb guard protector as well as wound VAC.  Neurological:     Comments: Patient is alert and a bit anxious.  Makes eye contact with examiner.  He does recall his latest CIR admission.  Oriented x3.   Results for orders placed or performed during the  hospital encounter of 03/26/21 (from the past 48 hour(s))  Glucose, capillary     Status: Abnormal   Collection Time: 03/28/21 11:05 AM  Result Value Ref Range   Glucose-Capillary 120 (H) 70 - 99 mg/dL    Comment: Glucose reference range applies only to samples taken after fasting for at least 8 hours.  Hemoglobin and hematocrit, blood     Status: Abnormal   Collection Time: 03/28/21  5:02 PM  Result Value Ref Range   Hemoglobin 7.4 (L) 13.0 - 17.0 g/dL   HCT 22.6 (L) 39.0 - 52.0 %    Comment: Performed at [facility] [address], [place], [place] [address]  Glucose, capillary     Status: Abnormal   Collection Time: 03/28/21  5:47 PM  Result Value Ref Range   Glucose-Capillary 121 (H) 70 - 99 mg/dL    Comment: Glucose reference range applies only to samples taken after fasting for at least 8 hours.  Glucose, capillary     Status: Abnormal   Collection Time: 03/28/21  9:15 PM  Result Value Ref Range   Glucose-Capillary 124 (H) 70 - 99 mg/dL  Comment: Glucose reference range applies only to samples taken after fasting for at least 8 hours.  Glucose, capillary     Status: Abnormal   Collection Time: 03/29/21  6:17 AM  Result Value Ref Range   Glucose-Capillary 127 (H) 70 - 99 mg/dL    Comment: Glucose reference range applies only to samples taken after fasting for at least 8 hours.  Glucose, capillary     Status: Abnormal   Collection Time: 03/29/21 11:10 AM  Result Value Ref Range   Glucose-Capillary 191 (H) 70 - 99 mg/dL    Comment: Glucose reference range applies only to samples taken after fasting for at least 8 hours.  CBC     Status: Abnormal   Collection Time: 03/29/21 12:19 PM  Result Value Ref Range   WBC 13.5 (H) 4.0 - 10.5 K/uL   RBC 2.38 (L) 4.22 - 5.81 MIL/uL   Hemoglobin 6.9 (LL) 13.0 - 17.0 g/dL    Comment: REPEATED TO VERIFY THIS CRITICAL RESULT HAS VERIFIED AND BEEN CALLED TO KAITLIN BEACHEM, RN BY DAVID APPIAH ON 01 19 2023 AT 1435, AND HAS BEEN READ  BACK.     HCT 20.3 (L) 39.0 - 52.0 %   MCV 85.3 80.0 - 100.0 fL   MCH 29.0 26.0 - 34.0 pg   MCHC 34.0 30.0 - 36.0 g/dL   RDW 15.3 11.5 - 15.5 %   Platelets 328 150 - 400 K/uL   nRBC 0.0 0.0 - 0.2 %    Comment: Performed at [facility], [place] [address], [place], [place] [address]  Glucose, capillary     Status: Abnormal   Collection Time: 03/29/21  3:54 PM  Result Value Ref Range   Glucose-Capillary 124 (H) 70 - 99 mg/dL    Comment: Glucose reference range applies only to samples taken after fasting for at least 8 hours.  Prepare RBC (crossmatch)     Status: None   Collection Time: 03/29/21  3:56 PM  Result Value Ref Range   Order Confirmation      ORDER PROCESSED BY BLOOD BANK Performed at [facility], [address] [place], [place] [address]   Type and screen [place]     Status: None   Collection Time: 03/29/21  3:56 PM  Result Value Ref Range   ABO/RH(D) O POS    Antibody Screen NEG    Sample Expiration 04/01/2021,2359    Unit Number [id number]    Blood Component Type RED CELLS,LR    Unit division 00    Status of Unit ISSUED,FINAL    Transfusion Status OK TO TRANSFUSE    Crossmatch Result Compatible    Unit Number [id number]    Blood Component Type RED CELLS,LR    Unit division 00    Status of Unit ISSUED,FINAL    Transfusion Status OK TO TRANSFUSE    Crossmatch Result      Compatible Performed at [facility], [place] [address] [place], [place] [address]   Glucose, capillary     Status: Abnormal   Collection Time: 03/29/21  9:46 PM  Result Value Ref Range   Glucose-Capillary 112 (H) 70 - 99 mg/dL    Comment: Glucose reference range applies only to samples taken after fasting for at least 8 hours.  Hemoglobin and hematocrit, blood     Status: Abnormal   Collection Time: 03/29/21 10:23 PM  Result Value Ref Range   Hemoglobin 8.6 (L) 13.0 - 17.0 g/dL  HCT 24.9 (L) 39.0 - 52.0 %    Comment: Performed at [facility], [place] [address], [place], [place] [address]  Glucose, capillary     Status: Abnormal   Collection Time: 03/30/21  6:05 AM  Result Value Ref Range   Glucose-Capillary 111 (H) 70 - 99 mg/dL    Comment: Glucose reference range applies only to samples taken after fasting for at least 8 hours.   No results found.     Medical Problem List and Plan: 1. 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   -ELOS/Goals: 10-14 days S  Admit to CIR 2.  Antithrombotics: -DVT/anticoagulation:  Pharmaceutical: Heparin  -antiplatelet therapy: Aspirin 81 mg daily and Plavix 75 mg daily 3. Pain Management: Baclofen 10 mg 3 times daily as needed, hydrocodone as needed 4. Mood/PTSD: Provide emotional support  -antipsychotic agents: N/A 5. Neuropsych: This patient is capable of making decisions on his own behalf. 6. Skin/Wound Care: Routine skin checks 7. Fluids/Electrolytes/Nutrition: Routine in and outs with follow-up chemistries 8.  Acute on chronic anemia.  Follow-up CBC 9.  Hypertension.  Norvasc 10 mg daily.  Monitor with increased mobility 10.  Diabetes mellitus with peripheral neuropathy.  Latest hemoglobin A1c 6.1.  Increase Glucophage 850 mg twice daily.  Check blood sugars before meals and at bedtime 11.  Hyperlipidemia.  Continue Lipitor 12.  History of CVA with left-sided residual weakness.  Continue aspirin  I have personally performed a face to face diagnostic evaluation, including, but not limited to relevant history and physical exam findings, of this patient and developed relevant assessment and plan.  Additionally, I have reviewed and concur with the physician assistant's documentation above.  Leeroy Cha, MD  Lavon Paganini LeRoy, PA-C 03/30/2021

## 2021-03-30 NOTE — Progress Notes (Signed)
Patient ID: Adam Perez, male   DOB: 1956-03-11, 66 y.o.   MRN: [id number] Met with the patient and ex-wife to review role, rehab process, team conference and plan of care. Discussed skin care, secondary risks and safety. Continue to follow along to discharge to address educational needs and facilitate preparation for discharge. Margarito Liner

## 2021-03-30 NOTE — Progress Notes (Signed)
INPATIENT REHABILITATION ADMISSION NOTE     Arrival Method: Transport via Nurse       Mental Orientation: AOx4     Assessment: completed     Skin: Dry and intact     IV'S: 22G R. Hand   Pain: No pain reported     Tubes and Drains: none     Safety Measures:  Bed in lowest position; Bed alarm set     Vital Signs: completed     Height and Weight: 5'7 and 91.3 kg   Rehab Orientation: completed     Family: Wife at bedside       Notes:  Patient has pleasant affect. Oriented to unit.

## 2021-03-30 NOTE — TOC Transition Note (Signed)
Transition of Care (TOC) - CM/SW Discharge Note Marvetta Gibbons RN, BSN Transitions of Care Unit 4E- RN Case Manager See Treatment Team for direct phone #    Patient Details  Name: Adam Perez MRN: [id number] Date of Birth: 1955/09/19  Transition of Care [facility]) CM/SW Contact:  Dawayne Patricia, RN Phone Number: 03/30/2021, 10:38 AM   Clinical Narrative:    Pt from home w/ spouse, s/p Left BKA. Was active with [facility] for RN/PT/OT prior to admit. CIR consulted post op for potential rehab, pt and wife agreeable. Per Karolee Ohs with [facility]-  pt may have CIR bed available later today vs tomorrow. If pt does not admit today then they will plan for admission tomorrow. Pt is stable for transition to [facility] when bed available.    Final next level of care: IP Rehab Facility Barriers to Discharge: No Barriers Identified   Patient Goals and CMS Choice Patient states their goals for this hospitalization and ongoing recovery are:: rehab then return home CMS Medicare.gov Compare Post Acute Care list provided to:: Patient Choice offered to / list presented to : Patient, Spouse  Discharge Placement               [facility].         Discharge Plan and Services   Discharge Planning Services: CM Consult Post Acute Care Choice: IP Rehab          DME Arranged: N/A DME Agency: NA         HH Agency: [facility] ([facility])        Social Determinants of Health (SDOH) Interventions     Readmission Risk Interventions Readmission Risk Prevention Plan 03/30/2021 02/23/2021  Transportation Screening Complete Complete  PCP or Specialist Appt within 3-5 Days Complete -  HRI or [place] Complete Complete  Social Work Consult for [place] Planning/Counseling Complete Complete  Palliative Care Screening Not Applicable Not Applicable  Medication Review Press photographer) Complete Complete  Some recent data might be hidden

## 2021-03-30 NOTE — Progress Notes (Signed)
Inpatient Rehabilitation Admission Medication Review by a Pharmacist  A complete drug regimen review was completed for this patient to identify any potential clinically significant medication issues.  High Risk Drug Classes Is patient taking? Indication by Medication  Antipsychotic No   Anticoagulant Yes Heparin - DVT px  Antibiotic No   Opioid Yes Vicodin for pain  Antiplatelet Yes ASA/plavix - CVA  Hypoglycemics/insulin Yes SSI/metformin - DM  Vasoactive Medication Yes Amlodipine - HTN  Chemotherapy No   Other Yes Protonix- GERD Baclofen - spasms Atorvastatin - HLD/PAD     Type of Medication Issue Identified Description of Issue Recommendation(s)  Drug Interaction(s) (clinically significant)     Duplicate Therapy     Allergy     No Medication Administration End Date     Incorrect Dose     Additional Drug Therapy Needed  Lantus, lisinopril-hydrochlorothiazide, lurasidone, percocet, zoloft, trazodone Message Dan Angiulli, PA  Significant med changes from prior encounter (inform family/care partners about these prior to discharge).    Other       Clinically significant medication issues were identified that warrant physician communication and completion of prescribed/recommended actions by midnight of the next day:  Yes  Name of provider notified for urgent issues identified: Deatra Ina, PA  Provider Method of Notification: Secure chat    Pharmacist comments:   Time spent performing this drug regimen review (minutes):  20   Ulyses Southward, PharmD, [place], AAHIVP, CPP Infectious Disease Pharmacist 03/30/2021 4:02 PM

## 2021-03-30 NOTE — Progress Notes (Signed)
PMR Admission Coordinator Pre-Admission Assessment ° °Patient: Adam Perez is an 65 y.o., male °MRN: [id number] °DOB: 05/28/1955 °Height: 5' 7" (170.2 cm) °Weight: 102.1 kg ° °Insurance Information °HMO:     PPO:      PCP:      IPA:      80/20: yes     OTHER:  °PRIMARY: Medicare A and B       Policy#: [id number]      Subscriber: pt °Phone#: Verified online    Fax#:  °Pre-Cert#:       Employer:  °Benefits:  Phone #:      Name:  °Eff. Date: Parts A ad B effective  08/10/2018 Deduct: $1556      Out of Pocket Max:  None      Life Max: N/A  °CIR: 100%      SNF: 100 days °Outpatient: 80%     Co-Pay: 20% °Home Health: 100%      Co-Pay: none °DME: 80%     Co-Pay: 20% °Providers: patient's choice °SECONDARY: [employer]       Policy#: [id number]     Phone#:  ° °Financial Counselor:       Phone#:  ° °The “Data Collection Information Summary” for patients in Inpatient Rehabilitation Facilities with attached “Privacy Act Statement-Health Care Records” was provided and verbally reviewed with: Patient ° °Emergency Contact Information °Contact Information   ° ° Name Relation Home Work Mobile  ° Ferrebee,[name] Spouse   [phone]  ° °  ° ° °Current Medical History  °Patient Admitting Diagnosis: Left BKA °History of Present Illness:Adam Perez is a 65-year-old right-handed male with history of diabetes mellitus, CVA with left-sided residual weakness, hypertension, PTSD, PVD with left popliteal angioplasty, left transmetatarsal amputation and left calf venous ulcer Sharp excisional debridement application of skin substitute 02/19/2021 receiving inpatient rehab services 02/23/2021 - 03/07/2021 ..  He was discharged home with dressing changes as indicated.  Per chart review patient currently living with his wife.  Used a wheelchair and walker prior to admission.  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 Hawken.  Wound  VAC was applied.  Patient remains on aspirin and Plavix as prior to admission.  Acute on chronic anemia and monitored with latest hemoglobin 8.6.  He was cleared to begin subcutaneous heparin for DVT prophylaxis.  Therapy evaluations completed due to patient decreased functional mobility was admitted for a comprehensive rehab program. ° °Patient's medical record from [facility]  has been reviewed by the rehabilitation admission coordinator and physician. ° °Past Medical History  °Past Medical History:  °Diagnosis Date  ° Anemia   ° COVID 2022  ° mild case  ° Diabetes mellitus without complication (HCC)   ° Hemiparesis (HCC)   ° left side; residual from prior acute ischemic CVA  ° HTN (hypertension)   ° Ischemic cerebrovascular accident (CVA) (HCC)   ° with residual left hemiparesis  ° PTSD (post-traumatic stress disorder)   ° ° °Has the patient had major surgery during 100 days prior to admission? Yes ° °Family History   °family history includes Diabetes in his brother and brother. ° °Current Medications ° °Current Facility-Administered Medications:  °  0.9 %  sodium chloride infusion, 250 mL, Intravenous, PRN, Baglia, Corrina, PA-C °  acetaminophen (TYLENOL) tablet 325-650 mg, 325-650 mg, Oral, Q6H PRN, Baglia, Corrina, PA-C °  alum & mag hydroxide-simeth (MAALOX/MYLANTA) 200-200-20 MG/5ML suspension   15-30 mL, 15-30 mL, Oral, Q2H PRN, Baglia, Corrina, PA-C °  amLODipine (NORVASC) tablet 10 mg, 10 mg, Oral, Daily, Baglia, Corrina, PA-C °  aspirin EC tablet 81 mg, 81 mg, Oral, Q breakfast, Baglia, Corrina, PA-C, 81 mg at 03/28/21 0805 °  atorvastatin (LIPITOR) tablet 40 mg, 40 mg, Oral, q1800, Baglia, Corrina, PA-C, 40 mg at 03/27/21 1656 °  baclofen (LIORESAL) tablet 10 mg, 10 mg, Oral, TID PRN, Baglia, Corrina, PA-C °  bisacodyl (DULCOLAX) suppository 10 mg, 10 mg, Rectal, Daily PRN, Baglia, Corrina, PA-C °  clopidogrel (PLAVIX) tablet 75 mg, 75 mg, Oral, Daily, Baglia, Corrina, PA-C, 75 mg at  03/28/21 0806 °  diphenhydrAMINE (BENADRYL) 12.5 MG/5ML elixir 12.5-25 mg, 12.5-25 mg, Oral, Q4H PRN, Baglia, Corrina, PA-C °  docusate sodium (COLACE) capsule 100 mg, 100 mg, Oral, Daily, Baglia, Corrina, PA-C, 100 mg at 03/27/21 0859 °  guaiFENesin-dextromethorphan (ROBITUSSIN DM) 100-10 MG/5ML syrup 15 mL, 15 mL, Oral, Q4H PRN, Baglia, Corrina, PA-C °  heparin injection 5,000 Units, 5,000 Units, Subcutaneous, Q8H, Baglia, Corrina, PA-C, 5,000 Units at 03/28/21 1515 °  hydrALAZINE (APRESOLINE) injection 5 mg, 5 mg, Intravenous, Q20 Min PRN, Baglia, Corrina, PA-C °  lisinopril (ZESTRIL) tablet 20 mg, 20 mg, Oral, Daily **AND** hydrochlorothiazide (HYDRODIURIL) tablet 25 mg, 25 mg, Oral, Daily, Hawken, Thomas N, MD °  HYDROcodone-acetaminophen (NORCO) 7.5-325 MG per tablet 1-2 tablet, 1-2 tablet, Oral, Q4H PRN, Baglia, Corrina, PA-C, 2 tablet at 03/27/21 1958 °  HYDROcodone-acetaminophen (NORCO/VICODIN) 5-325 MG per tablet 1-2 tablet, 1-2 tablet, Oral, Q4H PRN, Baglia, Corrina, PA-C, 2 tablet at 03/28/21 1019 °  insulin aspart (novoLOG) injection 0-15 Units, 0-15 Units, Subcutaneous, TID WC, Baglia, Corrina, PA-C, 2 Units at 03/27/21 1658 °  labetalol (NORMODYNE) injection 10 mg, 10 mg, Intravenous, Q10 min PRN, Baglia, Corrina, PA-C °  magnesium sulfate IVPB 2 g 50 mL, 2 g, Intravenous, Daily PRN, Baglia, Corrina, PA-C °  metFORMIN (GLUCOPHAGE) tablet 500 mg, 500 mg, Oral, BID WC, Baglia, Corrina, PA-C, 500 mg at 03/28/21 1514 °  metoprolol tartrate (LOPRESSOR) injection 2-5 mg, 2-5 mg, Intravenous, Q2H PRN, Baglia, Corrina, PA-C °  morphine 2 MG/ML injection 0.5-1 mg, 0.5-1 mg, Intravenous, Q2H PRN, Baglia, Corrina, PA-C °  ondansetron (ZOFRAN) injection 4 mg, 4 mg, Intravenous, Q6H PRN, Baglia, Corrina, PA-C °  pantoprazole (PROTONIX) EC tablet 40 mg, 40 mg, Oral, Daily, Baglia, Corrina, PA-C, 40 mg at 03/28/21 0805 °  phenol (CHLORASEPTIC) mouth spray 1 spray, 1 spray, Mouth/Throat, PRN, Baglia, Corrina, PA-C °   potassium chloride SA (KLOR-CON M) CR tablet 20-40 mEq, 20-40 mEq, Oral, Daily PRN, Baglia, Corrina, PA-C °  senna-docusate (Senokot-S) tablet 1 tablet, 1 tablet, Oral, QHS PRN, Baglia, Corrina, PA-C °  sodium chloride flush (NS) 0.9 % injection 3 mL, 3 mL, Intravenous, Q12H, Baglia, Corrina, PA-C, 3 mL at 03/28/21 0808 °  sodium chloride flush (NS) 0.9 % injection 3 mL, 3 mL, Intravenous, PRN, Baglia, Corrina, PA-C °  sodium phosphate (FLEET) 7-19 GM/118ML enema 1 enema, 1 enema, Rectal, Once PRN, Baglia, Corrina, PA-C ° °Patients Current Diet:  °Diet Order   ° °       °  Diet heart healthy/carb modified Room service appropriate? Yes with Assist; Fluid consistency: Thin  Diet effective now       °  ° °  °  ° °  ° ° °Precautions / Restrictions °Precautions °Precautions: Fall, Other (comment) °Precaution Comments: L residual limb wound vac; h/o L sided hemi/spasticity from prior CVA °Other   Brace: L BKA limb protector °Restrictions °Weight Bearing Restrictions: Yes °LLE Weight Bearing: Non weight bearing  ° °Has the patient had 2 or more falls or a fall with injury in the past year? Yes ° °Prior Activity Level °  ° °Prior Functional Level °Self Care: Did the patient need help bathing, dressing, using the toilet or eating? Needed some help ° °Indoor Mobility: Did the patient need assistance with walking from room to room (with or without device)? Needed some help ° °Stairs: Did the patient need assistance with internal or external stairs (with or without device)? Needed some help ° °Functional Cognition: Did the patient need help planning regular tasks such as shopping or remembering to take medications? Needed some help ° °Patient Information °Are you of Hispanic, Latino/a,or Spanish origin?: A. No, not of Hispanic, Latino/a, or Spanish origin °What is your race?: B. Black or African American °Do you need or want an interpreter to communicate with a doctor or health care staff?: 0. No ° °Patient's Response To:   °Health Literacy and Transportation °Is the patient able to respond to health literacy and transportation needs?: Yes °Health Literacy - How often do you need to have someone help you when you read instructions, pamphlets, or other written material from your doctor or pharmacy?: Never °In the past 12 months, has lack of transportation kept you from medical appointments or from getting medications?: No °In the past 12 months, has lack of transportation kept you from meetings, work, or from getting things needed for daily living?: No ° °Home Assistive Devices / Equipment °Home Equipment: Wheelchair - manual, Electric scooter, Cane - quad, Rollator (4 wheels), Rolling Walker (2 wheels) ° °Prior Device Use: Indicate devices/aids used by the patient prior to current illness, exacerbation or injury? Walker ° °Current Functional Level °Cognition ° Overall Cognitive Status: History of cognitive impairments - at baseline °Orientation Level: Oriented X4 °General Comments: difficulty maintaining attention to task; internally distrated; easily able to redirect; verbose, perseverating on topics °   °Extremity Assessment °(includes Sensation/Coordination) ° Upper Extremity Assessment: LUE deficits/detail °LUE Deficits / Details: residual hemiparesis; uses LUE as functional assist; holds in flexion but able to demonstrate isolated movements °LUE Coordination: decreased fine motor, decreased gross motor  °Lower Extremity Assessment: LLE deficits/detail °LLE Deficits / Details: s/p L BKA; hip flex 3/5, knee flex limited by post-op pain weakness, demonstrates good ext and quad set  °  °ADLs °    °  °Mobility ° Overal bed mobility: Needs Assistance °Bed Mobility: Supine to Sit °Supine to sit: Min assist, HOB elevated °General bed mobility comments: MinA for trunk elevation, use of bed rail  °  °Transfers ° Overall transfer level: Needs assistance °Equipment used: None, Rolling walker (2 wheels) °Transfers: Bed to  chair/wheelchair/BSC, Sit to/from Stand °Sit to Stand: Mod assist, +2 safety/equipment °Bed to/from chair/wheelchair/BSC transfer type:: Lateral/scoot transfer ° Lateral/Scoot Transfers: Min assist °Transfer via Lift Equipment:  (min A to CGA to power up) °General transfer comment: initial lateral scoot from bed to drop arm recliner towards R-side, pt requiring assist for setup, cues for sequencing, minA for stability; performed stand from recliner requiring heavy modA+2 for trunk elevation and stability, especially on L-side  °  °Ambulation / Gait / Stairs / Wheelchair Mobility ° Ambulation/Gait °General Gait Details: deferred secondary to fatigue with static standing trial  °  °Posture / Balance Balance °Overall balance assessment: Needs assistance °Sitting balance-Leahy Scale: Fair °Standing balance support: Bilateral upper extremity supported, Reliant on assistive device   for balance °Standing balance-Leahy Scale: Poor °Standing balance comment: reliant on BUE support and external assist  °  °Special needs/care consideration Skin surgical site, with wound vac  ° °Previous Home Environment (from acute therapy documentation) °Living Arrangements: Spouse/significant other ° Lives With: Alone °Available Help at Discharge: Family, Available PRN/intermittently °Type of Home: House °Home Layout: One level °Home Access: Level entry °Bathroom Shower/Tub: Tub/shower unit °Bathroom Toilet: Standard °Bathroom Accessibility: Yes °How Accessible: Accessible via wheelchair °Home Care Services: Yes °Type of Home Care Services: Home RN °Home Care Agency (if known): out of [place] °Additional Comments: Pt lives alone but has family nearby if he needs help; reports ex-wife could likely move in at d/c if needed. [facility] walk in shower. ° °Discharge Living Setting °Plans for Discharge Living Setting: Patient's home °Type of Home at Discharge: House °Discharge Home Layout: One level °Discharge  Home Access: Level entry °Discharge Bathroom Shower/Tub: Tub/shower unit °Discharge Bathroom Toilet: Standard °Discharge Bathroom Accessibility: Yes °How Accessible: Accessible via wheelchair, Accessible via walker °Does the patient have any problems obtaining your medications?: No ° °Social/Family/Support Systems °Patient Roles: Spouse, Other (Comment) °Contact Information: [phone] °Anticipated Caregiver: [name] (ex wife,, was caring for Pt. PTA) °Discharge Plan Discussed with Primary Caregiver: Yes °Is Caregiver In Agreement with Plan?: Yes ° °Goals °Patient/Family Goal for Rehab: PT/OT Supervision °Expected length of stay: 14-16 days °Pt/Family Agrees to Admission and willing to participate: Yes °Program Orientation Provided & Reviewed with Pt/Caregiver Including Roles  & Responsibilities: No ° °Decrease burden of Care through IP rehab admission: Specialzed equipment needs, Decrease number of caregivers, and Patient/family education ° °Possible need for SNF placement upon discharge: not anticipated  ° °Patient Condition: I have reviewed medical records from [facility], spoken with CM, and patient. I met with patient at the bedside for inpatient rehabilitation assessment.  Patient will benefit from ongoing PT and OT, can actively participate in 3 hours of therapy a day 5 days of the week, and can make measurable gains during the admission.  Patient will also benefit from the coordinated team approach during an Inpatient Acute Rehabilitation admission.  The patient will receive intensive therapy as well as Rehabilitation physician, nursing, social worker, and care management interventions.  Due to safety, skin/wound care, disease management, medication administration, pain management, and patient education the patient requires 24 hour a day rehabilitation nursing.  The patient is currently min A with mobility and basic ADLs.  Discharge setting and therapy post discharge at home with home  health is anticipated.  Patient has agreed to participate in the Acute Inpatient Rehabilitation Program and will admit today. ° °Preadmission Screen Completed By:  Swara Donze B Marbella Markgraf, 03/28/2021 3:19 PM °______________________________________________________________________   °Discussed status with Dr. Raulkar  on 03/30/21 at 930 and received approval for admission today. ° °Admission Coordinator:  Farah Benish B Shaguana Love, CCC-SLP, time 1113/Date 03/30/21  ° °Assessment/Plan: °Diagnosis: L BKA °Does the need for close, 24 hr/day Medical supervision in concert with the patient's rehab needs make it unreasonable for this patient to be served in a less intensive setting? 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 Yes °Does the patient require coordinated care of a physician, rehab nurse, PT, OT to address physical and functional deficits in the context   to cerebral infarction, DKA, GAD Due to bladder management, bowel management, safety, skin/wound care, disease management, medication administration, pain management, and patient education, does the patient require 24 hr/day rehab nursing? Yes Does the patient require coordinated care of a physician, rehab nurse, PT, OT to address physical and functional deficits in the context of the above medical diagnosis(es)? Yes Addressing deficits in the following areas: balance, endurance, locomotion, strength, transferring, bowel/bladder control, bathing, dressing, feeding, grooming, toileting, and psychosocial support Can the patient actively participate in an intensive therapy program of at least 3 hrs of therapy 5 days a week? Yes The potential for patient to make measurable gains while on inpatient rehab is excellent Anticipated functional outcomes upon discharge from inpatient rehab: modified independent PT, modified independent OT, independent SLP Estimated rehab length of stay to reach the above functional goals is: 10-12 days Anticipated discharge destination: Home 10. Overall Rehab/Functional  Prognosis: excellent     MD Signature: Leeroy Cha, MD

## 2021-03-30 NOTE — Discharge Summary (Signed)
Discharge Summary  Patient ID: Adam Perez [id number] 65 y.o. 06-Nov-1955  Admit date: 03/26/2021  Discharge date and time: 03/30/21   Admitting Physician: Adam Robins, MD   Discharge Physician: same  Admission Diagnoses: PAD (peripheral artery disease) [facility]) [I73.9]  Discharge Diagnoses: same  Admission Condition: fair  Discharged Condition: fair  Indication for Admission: Left below the knee amputation  Hospital Course: [name] is a 66 year old male who had undergone percutaneous intervention and wound debridement of left foot and ankle during past hospitalization.  Wound deteriorated despite optimized circulation.  He was brought back to the operating room on 03/26/2021 by Dr. Stanford Perez and underwent left below the knee amputation.  He tolerated the procedure well and was admitted to the hospital postoperatively.  He experienced some postoperative bleeding from below the knee amputation incision requiring transfusion on 2 separate occasions.  On 03/29/2021 he required 2 additional units of packed red blood cells and 1 L saline bolus due to hypotension.  Today he is asymptomatic and feels much better after fluid intake.  Left below the knee amputation is not actively bleeding.  He is ready for discharge to inpatient rehabilitation.  We can follow BKA incision intermittently while there.  Sutures and staples will be removed in 4 to 6 weeks from surgery.  He will be discharged to CIR in stable condition.  Consults: None  Treatments: surgery: Left below the knee amputation by Dr. Stanford Perez on 03/26/2021  Discharge Exam: See progress note 03/30/21 Vitals:   03/30/21 0344 03/30/21 0808  BP: 94/65 112/77  Pulse: 98 89  Resp: 18 20  Temp: (!) 97.4 F (36.3 C) (!) 97.2 F (36.2 C)  SpO2: 97% 98%     Disposition: Discharge disposition: 90-DC/txfr to inpt rehab facility with planned acute care hosp IP admission       Patient Instructions:  Allergies as of 03/30/2021    No Known Allergies      Medication List     TAKE these medications    amLODipine 10 MG tablet Commonly known as: NORVASC Take 1 tablet (10 mg total) by mouth daily.   aspirin EC 81 MG tablet Take 1 tablet (81 mg total) by mouth daily with breakfast.   atorvastatin 40 MG tablet Commonly known as: LIPITOR Take 1 tablet (40 mg total) by mouth daily. What changed: when to take this   baclofen 10 MG tablet Commonly known as: LIORESAL Take 10 mg by mouth 3 (three) times daily as needed for muscle spasms.   clopidogrel 75 MG tablet Commonly known as: PLAVIX Take 1 tablet (75 mg total) by mouth daily.   insulin glargine 100 UNIT/ML Solostar Pen Commonly known as: LANTUS Inject 10 Units into the skin at bedtime.   insulin glargine 100 UNIT/ML Solostar Pen Commonly known as: LANTUS Inject 10 Units into the skin daily.   lisinopril-hydrochlorothiazide 20-25 MG tablet Commonly known as: ZESTORETIC Take 1 tablet by mouth daily.   lurasidone 20 MG Tabs tablet Commonly known as: LATUDA Take 1 tablet (20 mg total) by mouth daily.   metFORMIN 500 MG tablet Commonly known as: GLUCOPHAGE Take 500 mg by mouth 2 (two) times daily with a meal.   methocarbamol 500 MG tablet Commonly known as: ROBAXIN Take 1 tablet (500 mg total) by mouth every 6 (six) hours as needed for muscle spasms.   multivitamin with minerals Tabs tablet Take 1 tablet by mouth daily.   oxyCODONE-acetaminophen 5-325 MG tablet Commonly known as: PERCOCET/ROXICET Take 1-2 tablets  by mouth every 4 (four) hours as needed for moderate pain.   polyethylene glycol 17 g packet Commonly known as: MIRALAX / GLYCOLAX Take 17 g by mouth 2 (two) times daily as needed for mild constipation.   senna-docusate 8.6-50 MG tablet Commonly known as: Senokot-S Take 2 tablets by mouth at bedtime.   sertraline 100 MG tablet Commonly known as: ZOLOFT Take 1.5 tablets (150 mg total) by mouth daily.   traZODone 100 MG  tablet Commonly known as: DESYREL Take 1 tablet (100 mg total) by mouth at bedtime. What changed:  when to take this reasons to take this       Activity: activity as tolerated Diet: regular diet Wound Care: keep wound clean and dry  Follow-up with [facility] in 4 weeks.  Signed: Dagoberto Ligas, PA-C 03/30/2021 9:31 AM [facility] Office: [phone]

## 2021-03-30 NOTE — Progress Notes (Signed)
Inpatient Rehab Admissions Coordinator:  ? ?I have a bed for this Pt. On CIR today. RN may call report to [phone]. ? ?Toluwani Ruder, MS, CCC-SLP ?Rehab Admissions Coordinator  ?[phone] (celll) ?[phone] (office) ?

## 2021-03-30 NOTE — Progress Notes (Signed)
°  Progress Note    03/30/2021 7:48 AM 4 Days Post-Op  Subjective:  light headed, hypotensive, anemic yesterday.  Feeling much better today   Vitals:   03/29/21 2338 03/30/21 0344  BP: 98/72 94/65  Pulse: 100 98  Resp: 20 18  Temp: 98.1 F (36.7 C) (!) 97.4 F (36.3 C)  SpO2: 100% 97%   Physical Exam: Lungs:  non labored Incisions:  L BKA incision without bleeding; some fullness to medial incision Neurologic: A&O  CBC    Component Value Date/Time   WBC 13.5 (H) 03/29/2021 1219   RBC 2.38 (L) 03/29/2021 1219   HGB 8.6 (L) 03/29/2021 2223   HCT 24.9 (L) 03/29/2021 2223   PLT 328 03/29/2021 1219   MCV 85.3 03/29/2021 1219   MCH 29.0 03/29/2021 1219   MCHC 34.0 03/29/2021 1219   RDW 15.3 03/29/2021 1219   LYMPHSABS 1.7 03/09/2021 1724   MONOABS 0.9 03/09/2021 1724   EOSABS 0.1 03/09/2021 1724   BASOSABS 0.1 03/09/2021 1724    BMET    Component Value Date/Time   NA 136 03/28/2021 0212   K 3.6 03/28/2021 0212   CL 104 03/28/2021 0212   CO2 24 03/28/2021 0212   GLUCOSE 118 (H) 03/28/2021 0212   BUN 14 03/28/2021 0212   CREATININE 1.00 03/28/2021 0212   CALCIUM 9.0 03/28/2021 0212   GFRNONAA >60 03/28/2021 0212   GFRAA >60 07/06/2019 0407   GFRAA >60 07/06/2019 0407    INR    Component Value Date/Time   INR 1.1 03/26/2021 0555     Intake/Output Summary (Last 24 hours) at 03/30/2021 0748 Last data filed at 03/30/2021 0600 Gross per 24 hour  Intake 110 ml  Output 1375 ml  Net -1265 ml     Assessment/Plan:  66 y.o. male is s/p L BKA 4 Days Post-Op   -Subjectively feeling much better after fluid and pRBC transfusion yesterday. Check CBC this morning, continue maintenance fluids as he is still borderline hypotensive -Dressing changed this morning, no active bleeding from stump; fullness to medial incision suggesting some hematoma, continue to monitor -Ok to participate in therapy    Dagoberto Ligas, PA-C [facility] [phone] 03/30/2021 7:48 AM

## 2021-03-30 NOTE — Progress Notes (Signed)
Mobility Specialist: Progress Note   03/30/21 1412  Mobility  Bed Position Chair  Activity Ambulated with assistance in room  Level of Assistance Minimal assist, patient does 75% or more  Assistive Device Front wheel walker  Distance Ambulated (ft) 16 ft (8'x2)  Activity Response Tolerated fair  $Mobility charge 1 Mobility   Pre-Mobility: 99 HR, 101/68 BP During Mobility: 140 HR Post-Mobility: 110 HR, 109/70 BP  Pt required minA to stand and min guard during ambulation. Pt able to perform hop steps from the recliner to the end of the bed before needing to sit. Pt has tendency to sit prematurely despite verbal cues. Pt back in recliner with call bell in reach and chair alarm on.   [facility] Keslie Gritz Mobility Specialist Mobility Specialist [address] [place]: [phone] Mobility Specialist 2 [place]: [phone]

## 2021-03-30 NOTE — Progress Notes (Signed)
Report given and Pt transferred to [address].

## 2021-03-31 DIAGNOSIS — Z89512 Acquired absence of left leg below knee: Secondary | ICD-10-CM

## 2021-03-31 LAB — GLUCOSE, CAPILLARY
Glucose-Capillary: 105 mg/dL — ABNORMAL HIGH (ref 70–99)
Glucose-Capillary: 119 mg/dL — ABNORMAL HIGH (ref 70–99)
Glucose-Capillary: 125 mg/dL — ABNORMAL HIGH (ref 70–99)
Glucose-Capillary: 132 mg/dL — ABNORMAL HIGH (ref 70–99)

## 2021-03-31 MED ORDER — ENSURE MAX PROTEIN PO LIQD
11.0000 [oz_av] | Freq: Two times a day (BID) | ORAL | Status: DC
  Administered 2021-04-01 – 2021-04-09 (×16): 11 [oz_av] via ORAL

## 2021-03-31 NOTE — Evaluation (Signed)
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, and muscle weakness (generalized) Rehab Potential: Rehab Potential (ACUTE ONLY): Good ELOS: 10-12 days   Today's Date: 03/31/2021 OT Individual Time: 1105-1200 OT Individual Time Calculation (min): 55 min     Hospital Problem: Principal Problem:   Left below-knee amputee [facility])   Past Medical History:  Past Medical History:  Diagnosis Date   Anemia    COVID 2022   mild case   Diabetes mellitus without complication ([place])    Hemiparesis ([place])    left side; residual from prior acute ischemic CVA   HTN (hypertension)    Ischemic cerebrovascular accident (CVA) ([place])    with residual left hemiparesis   PTSD (post-traumatic stress disorder)    Past Surgical History:  Past Surgical History:  Procedure Laterality Date   AMPUTATION Left 03/26/2021   Procedure: LEFT BELOW KNEE AMPUTATION;  Surgeon: Cherre Robins, MD;  Location: [place];  Service: Vascular;  Laterality: Left;   ANGIOPLASTY Left 02/19/2021   Procedure: POPLITEAL ANGIOPLASTY;  Surgeon: Cherre Robins, MD;  Location: [place];  Service: Vascular;  Laterality: Left;   APPLICATION OF WOUND VAC Left 03/26/2021   Procedure: APPLICATION OF INCISIONAL WOUND [place];  Surgeon: Cherre Robins, MD;  Location: [place];  Service: Vascular;  Laterality: Left;   LOWER EXTREMITY ANGIOGRAM Left 02/19/2021   Procedure: LEFT LOWER EXTREMITY ANGIOGRAM;  Surgeon: Cherre Robins, MD;  Location: [place];  Service: Vascular;  Laterality: Left;   LOWER EXTREMITY ANGIOGRAPHY N/A 02/15/2021   Procedure: LOWER EXTREMITY ANGIOGRAPHY;  Surgeon: Cherre Robins, MD;  Location: [place] CV LAB;  Service: Cardiovascular;  Laterality: N/A;   TRANSMETATARSAL AMPUTATION Left 02/19/2021   Procedure: LEFT TRANSMETATARSAL AMPUTATION;   Surgeon: Cherre Robins, MD;  Location: [facility] OR;  Service: Vascular;  Laterality: Left;   ULTRASOUND GUIDANCE FOR VASCULAR ACCESS Right 02/19/2021   Procedure: ULTRASOUND GUIDANCE FOR RIGHT COMMON FEMORAL ARTERY ACCESS;  Surgeon: Cherre Robins, MD;  Location: [facility] OR;  Service: Vascular;  Laterality: Right;   WOUND DEBRIDEMENT Left 02/19/2021   Procedure: LEFT CALF VENOUS ULCER SHARP EXCISIONAL DEBRIDEMENT;  Surgeon: Cherre Robins, MD;  Location: [facility] OR;  Service: Vascular;  Laterality: Left;    Assessment & Plan Clinical Impression: Patient is a 66 y.o. year old male with recent admission to the hospital on 03/26/2021 with malodorous left leg wound and follow-up vascular surgery no options for revascularization and underwent left BKA 03/26/2021 per Dr. Stanford Breed.  Wound VAC was applied.  Patient transferred to CIR on 03/30/2021 .    Patient currently requires min with basic self-care skills secondary to muscle weakness, muscle joint tightness, and muscle paralysis, impaired timing and sequencing, unbalanced muscle activation, and decreased coordination, decreased awareness, and decreased standing balance, decreased postural control, hemiplegia, and decreased balance strategies.  Prior to hospitalization, patient could complete ADLs with modified independent .  Patient will benefit from skilled intervention to decrease level of assist with basic self-care skills and increase independence with basic self-care skills prior to discharge home with care partner.  Anticipate patient will require 24 hour supervision and follow up home health.  OT - End of Session Activity Tolerance: Tolerates 30+ min activity with multiple rests Endurance Deficit: Yes Endurance Deficit Description: required breaks OT Assessment Rehab Potential (ACUTE ONLY): Good OT Barriers to Discharge: Decreased  caregiver support OT Patient demonstrates impairments in the following area(s): Balance;Endurance;Motor;Pain;Safety OT Basic  ADL's Functional Problem(s): Grooming;Bathing;Dressing;Toileting OT Advanced ADL's Functional Problem(s): Simple Meal Preparation OT Transfers Functional Problem(s): Toilet;Tub/Shower OT Additional Impairment(s): Fuctional Use of Upper Extremity;None OT Plan OT Intensity: Minimum of 1-2 x/day, 45 to 90 minutes OT Frequency: 5 out of 7 days OT Duration/Estimated Length of Stay: 10-12 days OT Treatment/Interventions: Balance/vestibular training;Disease mangement/prevention;Neuromuscular re-education;Self Care/advanced ADL retraining;Therapeutic Exercise;Wheelchair propulsion/positioning;UE/LE Strength taining/ROM;Skin care/wound managment;Pain management;DME/adaptive equipment instruction;Cognitive remediation/compensation;Community reintegration;Functional electrical stimulation;Patient/family education;Splinting/orthotics;UE/LE Coordination activities;Visual/perceptual remediation/compensation;Therapeutic Activities;Psychosocial support;Functional mobility training;Discharge planning OT Self Feeding Anticipated Outcome(s): independent OT Basic Self-Care Anticipated Outcome(s): supervision level OT Toileting Anticipated Outcome(s): supervision level OT Bathroom Transfers Anticipated Outcome(s): supervision OT Recommendation Recommendations for Other Services: Therapeutic Recreation consult Therapeutic Recreation Interventions: Stress management Patient destination: Home Follow Up Recommendations: Home health OT;24 hour supervision/assistance Equipment Recommended: None recommended by OT   OT Evaluation Precautions/Restrictions  Precautions Precautions: Fall Precaution Comments: LLE NWB Required Braces or Orthoses: Other Brace Other Brace: limb protector Restrictions Weight Bearing Restrictions: Yes LLE Weight Bearing: Non weight bearing   Pain Pain Assessment Pain Scale: 0-10 Pain Score: 5  Pain Type: Surgical pain Pain Location: Leg Pain Orientation: Left Pain Descriptors /  Indicators: Discomfort Pain Onset: With Activity Pain Intervention(s): Repositioned Home Living/Prior Functioning Home Living Family/patient expects to be discharged to:: Private residence Living Arrangements: Spouse/significant other Available Help at Discharge: Family, Available 24 hours/day Type of Home: House Home Access: Level entry Home Layout: One level Bathroom Shower/Tub: Chiropodist: Standard Bathroom Accessibility: Yes Additional Comments: Recent stay in [place] (discharged 12/28) LLE was not healing and turning black  Lives With: Alone, Other (Comment) IADL History Homemaking Responsibilities: Yes Meal Prep Responsibility: Primary Laundry Responsibility: Primary Cleaning Responsibility: Primary Bill Paying/Finance Responsibility: Primary Shopping Responsibility: Primary Current License: Yes 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, Cytogeneticist Level of Independence: [place] for independence, Needs assistance with [place] to [place]?: No Driving: No Vocation: On disability Vocation Requirements: CVA 5 years prior (12/17) Vision Baseline Vision/History: 1 Wears glasses (near vision only) Ability to See in Adequate Light: 0 Adequate Patient Visual Report: No change from baseline Vision Assessment?: No apparent visual deficits Perception  Perception: Within Functional Limits Praxis Praxis: Intact Cognition Overall Cognitive Status: History of cognitive impairments - at baseline Arousal/Alertness: Awake/alert Orientation Level: Person;Place;Situation Person: Oriented Place: Oriented Situation: Oriented Year: 2023 Month: January Day of Week: Correct Memory: Appears intact Immediate Memory Recall: Sock;Blue;Bed Memory Recall Sock: Without Cue Memory Recall Blue: Without Cue Memory Recall Bed: Without Cue Attention: Sustained Sustained Attention: Appears intact Awareness:  Impaired Awareness Impairment: Anticipatory impairment Problem Solving: Impaired Safety/Judgment: Impaired Comments: unsafe transfer method requring cues for safety Sensation Sensation Light Touch: Appears Intact Hot/Cold: Appears Intact Proprioception: Not tested Stereognosis: Not tested Additional Comments: BUE light touch intact per report as well as hot cold. Coordination Gross Motor Movements are Fluid and Coordinated: No Fine Motor Movements are Fluid and Coordinated: No Coordination and Movement Description: Pt with hx of LUE hemiparesis secondary to CVA.  Synergy pattern noted with functional use at a diminshed level for bathing, dressing, and transfers with use of the RW fo support. Motor  Motor Motor: Hemiplegia;Abnormal postural alignment and control Motor - Skilled Clinical Observations: LUE hemiparesis from previous CVA with increased tone noted as well.  Trunk/Postural Assessment  Cervical Assessment Cervical Assessment: Exceptions to [facility] (forward head) Thoracic Assessment Thoracic Assessment: Exceptions to [facility] (thoracic flexion) Lumbar  Assessment Lumbar Assessment: Exceptions to [facility] (posterior pelvic tilt in sitting) Postural Control Postural Control: Deficits on evaluation Protective Responses: delayed  Balance Balance Balance Assessed: Yes Static Sitting Balance Static Sitting - Balance Support: Feet supported;Bilateral upper extremity supported Static Sitting - Level of Assistance: 5: Stand by assistance Dynamic Sitting Balance Dynamic Sitting - Balance Support: No upper extremity supported Dynamic Sitting - Level of Assistance: 5: Stand by assistance Static Standing Balance Static Standing - Balance Support: Bilateral upper extremity supported Static Standing - Level of Assistance: 4: Min assist Dynamic Standing Balance Dynamic Standing - Balance Support: Bilateral upper extremity supported Dynamic Standing - Level of Assistance: 4: Min assist Dynamic  Standing - Comments: with transfers Extremity/Trunk Assessment RUE Assessment RUE Assessment: Within Functional Limits LUE Assessment LUE Assessment: Exceptions to [facility] Passive Range of Motion (PROM) Comments: shoulder flexion 0-110 degrees, -20 degrees elbow extension end range General Strength Comments: Brunnstrum stage V movement in the arm with stage VI in the hand.  Increased tone also noted in the elbow and wrist flexors.  Uses it at a diminshed level for selfcare tasks.  Care Tool Care Tool Self Care Eating   Eating Assist Level: Independent    Oral Care    Oral Care Assist Level: Set up assist    Bathing   Body parts bathed by patient: Right arm;Left arm;Chest;Abdomen;Front perineal area;Right upper leg;Left upper leg;Face;Buttocks;Right lower leg   Body parts n/a: Left lower leg (amputation) Assist Level: Minimal Assistance - Patient > 75%    Upper Body Dressing(including orthotics)   What is the patient wearing?: Pull over shirt   Assist Level: Set up assist    Lower Body Dressing (excluding footwear)   What is the patient wearing?: Pants Assist for lower body dressing: Minimal Assistance - Patient > 75%    Putting on/Taking off footwear   What is the patient wearing?: Non-skid slipper socks (right sock only, did not attempt doffin limb guard this session as he was already wearing it) Assist for footwear: Set up assist       Care Tool Toileting Toileting activity   Assist for toileting: Minimal Assistance - Patient > 75%     Care Tool Bed Mobility Roll left and right activity   Roll left and right assist level: Supervision/Verbal cueing Roll left and right assistive device comment: flat bed using bedrail  Sit to lying activity        Lying to sitting on side of bed activity   Lying to sitting on side of bed assist level: the ability to move from lying on the back to sitting on the side of the bed with no back support.: Supervision/Verbal cueing Lying to  sitting on side of bed assist device comment: the ability to move from lying on the back to sitting on the side of the bed with no back support.: flat bed using bedrail   Care Tool Transfers Sit to stand transfer   Sit to stand assist level: Minimal Assistance - Patient > 75%    Chair/bed transfer   Chair/bed transfer assist level: Minimal Assistance - Patient > 75% Chair/bed transfer assistive device: Barrister's clerk transfer   Assist Level: Minimal Assistance - Patient > 75%     Care Tool Cognition  Expression of Ideas and Wants Expression of Ideas and Wants: 3. Some difficulty - exhibits some difficulty with expressing needs and ideas (e.g, some words or finishing thoughts) or speech is not clear  Understanding Verbal and Non-Verbal Content  Understanding Verbal and Non-Verbal Content: 3. Usually understands - understands most conversations, but misses some part/intent of message. Requires cues at times to understand   Memory/Recall Ability Memory/Recall Ability : Current season;That he or she is in a hospital/hospital unit   Refer to Care Plan for [place] 1 OT Short Term Goal 1 (Week 1): Pt will complete LB bathing sit to stand with min guard assist for two consecutive sessions. OT Short Term Goal 2 (Week 1): Pt will complete LB dressing with min guard sit to stand including donning limb guard on the LLE. OT Short Term Goal 3 (Week 1): Pt will complete toilet transfer at min guard assist to the 3:1 with use of the RW for support.  Recommendations for other services: Therapeutic Recreation  Stress management   Skilled Therapeutic Intervention ADL ADL Eating: Independent Where Assessed-Eating: Wheelchair Grooming: Setup Where Assessed-Grooming: Wheelchair Upper Body Bathing: Setup Where Assessed-Upper Body Bathing: Wheelchair;Sitting at sink Lower Body Bathing: Minimal assistance Where Assessed-Lower Body Bathing: Sitting at sink;Standing at  sink Upper Body Dressing: Setup Where Assessed-Upper Body Dressing: Wheelchair Lower Body Dressing: Minimal assistance Where Assessed-Lower Body Dressing: Wheelchair Toileting: Minimal assistance Where Assessed-Toileting: Bedside Commode Toilet Transfer: Minimal assistance Armed forces technical officer Method: Arts development officer: Radiographer, therapeutic: Not assessed Social research officer, government: Not assessed Mobility  Bed Mobility Bed Mobility: Rolling Right;Rolling Left;Supine to Sit Rolling Right: Supervision/verbal cueing Rolling Left: Supervision/Verbal cueing Supine to Sit: Supervision/Verbal cueing Transfers Sit to Stand: Minimal Assistance - Patient > 75% Stand to Sit: Minimal Assistance - Patient > 75%  Session Note:  Pt up in wheelchair to start with agreement to complete grooming tasks at the sink as well as bathing and dressing.  He was setup in sitting for completion of oral hygiene and shaving.  He was able to perform UB bathing and dressing at supervision level from the wheelchair, with LB bathing and dressing at min assist level sit to stand with use of the RW for support.  He did not doff his left limb guard this session, which was in place to start.  Pt with increased LUE hemiparesis requiring placement of the hand on the RW prior to standing with some difficulty maintaining grip.  He was then able to complete stand pivot transfer to the 3:1 with the RW using heel to toe method along with small hops.  Min assist for clothing management and toilet hygiene as well.  Discussed need for 24 hr supervision initially at discharge and pt reports that between his brother who lives nearby, neighbors, and his ex-wife, this will be provided.  Finished session with pt in the wheelchair and with the call button and phone in reach.   Discharge Criteria: Patient will be discharged from OT if patient refuses treatment 3 consecutive times without medical reason, if treatment goals  not met, if there is a change in medical status, if patient makes no progress towards goals or if patient is discharged from hospital.  The above assessment, treatment plan, treatment alternatives and goals were discussed and mutually agreed upon: by patient  Mycheal Veldhuizen OTR/L 03/31/2021, 12:54 PM

## 2021-03-31 NOTE — Evaluation (Addendum)
Physical Therapy Assessment and Plan  Patient Details  Name: Adam Perez MRN: [id number] Date of Birth: 1955/05/31  PT Diagnosis: Abnormal posture, Abnormality of gait, Coordination disorder, Difficulty walking, Edema, Hemiparesis non-dominant, Muscle spasms, Muscle weakness, and Pain in L residual limb Rehab Potential: Fair ELOS: 7-10 days   Today's Date: 03/31/2021 PT Individual Time: 0800-0857 PT Individual Time Calculation (min): 57 min    Hospital Problem: Principal Problem:   Left below-knee amputee [facility])   Past Medical History:  Past Medical History:  Diagnosis Date   Anemia    COVID 2022   mild case   Diabetes mellitus without complication ([place])    Hemiparesis ([place])    left side; residual from prior acute ischemic CVA   HTN (hypertension)    Ischemic cerebrovascular accident (CVA) ([place])    with residual left hemiparesis   PTSD (post-traumatic stress disorder)    Past Surgical History:  Past Surgical History:  Procedure Laterality Date   AMPUTATION Left 03/26/2021   Procedure: LEFT BELOW KNEE AMPUTATION;  Surgeon: Cherre Robins, MD;  Location: [place];  Service: Vascular;  Laterality: Left;   ANGIOPLASTY Left 02/19/2021   Procedure: POPLITEAL ANGIOPLASTY;  Surgeon: Cherre Robins, MD;  Location: [place];  Service: Vascular;  Laterality: Left;   APPLICATION OF WOUND VAC Left 03/26/2021   Procedure: APPLICATION OF INCISIONAL WOUND VAC;  Surgeon: Cherre Robins, MD;  Location: [place];  Service: Vascular;  Laterality: Left;   LOWER EXTREMITY ANGIOGRAM Left 02/19/2021   Procedure: LEFT LOWER EXTREMITY ANGIOGRAM;  Surgeon: Cherre Robins, MD;  Location: [place];  Service: Vascular;  Laterality: Left;   LOWER EXTREMITY ANGIOGRAPHY N/A 02/15/2021   Procedure: LOWER EXTREMITY ANGIOGRAPHY;  Surgeon: Cherre Robins, MD;  Location: [place] [facility];  Service: Cardiovascular;  Laterality: N/A;   TRANSMETATARSAL AMPUTATION Left 02/19/2021   Procedure: LEFT TRANSMETATARSAL  AMPUTATION;  Surgeon: Cherre Robins, MD;  Location: [facility] OR;  Service: Vascular;  Laterality: Left;   ULTRASOUND GUIDANCE FOR VASCULAR ACCESS Right 02/19/2021   Procedure: ULTRASOUND GUIDANCE FOR RIGHT COMMON FEMORAL ARTERY ACCESS;  Surgeon: Cherre Robins, MD;  Location: [facility] OR;  Service: Vascular;  Laterality: Right;   WOUND DEBRIDEMENT Left 02/19/2021   Procedure: LEFT CALF VENOUS ULCER SHARP EXCISIONAL DEBRIDEMENT;  Surgeon: Cherre Robins, MD;  Location: [facility] OR;  Service: Vascular;  Laterality: Left;    Assessment & Plan Clinical Impression: Patient is a 66 y.o. year old male with history of diabetes mellitus, CVA with left-sided residual weakness, hypertension, PTSD, PVD with left popliteal angioplasty, left transmetatarsal amputation and left calf venous ulcer Sharp excisional debridement application of skin substitute 02/19/2021 receiving inpatient rehab services 02/23/2021 - 03/07/2021 .Marland Kitchen  He was discharged home with dressing changes as indicated.  Per chart review patient currently living with his wife.  Used a wheelchair and walker prior to admission.  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 Stanford Breed.  Wound VAC was applied.  Patient remains on aspirin and Plavix as prior to admission.  Acute on chronic anemia and monitored with latest hemoglobin 8.6.  He was cleared to begin subcutaneous heparin for DVT prophylaxis.  Therapy evaluations completed due to patient decreased functional mobility was admitted for a comprehensive rehab program. Was feeling lightheaded yesterday, but better today.    Patient currently requires min with mobility secondary to muscle weakness and muscle joint tightness, decreased cardiorespiratoy  endurance, abnormal tone, unbalanced muscle activation, decreased coordination, and decreased motor planning, and decreased standing balance, decreased postural  control, hemiplegia, decreased balance strategies, and difficulty maintaining precautions.  Prior to hospitalization, patient was modified independent  with mobility and lived with Alone, Other (Comment) (ex-wife recently moved in and brother lives across the street) in a House home.  Home access is  Level entry.  Patient will benefit from skilled PT intervention to maximize safe functional mobility, minimize fall risk, and decrease caregiver burden for planned discharge home with 24 hour supervision.  Anticipate patient will benefit from follow up [place] at discharge.  PT - End of Session Activity Tolerance: Tolerates 30+ min activity with multiple rests Endurance Deficit: Yes Endurance Deficit Description: required breaks PT Assessment Rehab Potential (ACUTE/IP ONLY): Fair PT Barriers to Discharge: Home environment access/layout;Weight bearing restrictions;Behavior;Wound Care PT Barriers to Discharge Comments: pain, previous CVA, weakness/deconditoning, decreased safety awareness and understanding of deficits PT Patient demonstrates impairments in the following area(s): Balance;Edema;Endurance;Motor;Nutrition;Pain;Perception;Safety;Skin Integrity;Behavior PT Transfers Functional Problem(s): Bed Mobility;Bed to Chair;Car;Furniture PT Locomotion Functional Problem(s): Ambulation;Wheelchair Mobility;Stairs PT Plan PT Intensity: Minimum of 1-2 x/day ,45 to 90 minutes PT Frequency: 5 out of 7 days PT Duration Estimated Length of Stay: 7-10 days PT Treatment/Interventions: Ambulation/gait training;Balance/vestibular training;Cognitive remediation/compensation;Disease management/prevention;Discharge planning;Community reintegration;DME/adaptive equipment instruction;Functional mobility training;Patient/family education;Pain management;Neuromuscular re-education;Psychosocial support;Skin care/wound management;Splinting/orthotics;Therapeutic Exercise;Therapeutic Activities;Stair training;UE/LE Strength  taining/ROM;UE/LE Coordination activities;Visual/perceptual remediation/compensation;Wheelchair propulsion/positioning;Functional electrical stimulation PT Transfers Anticipated Outcome(s): supervision with LRAD PT Locomotion Anticipated Outcome(s): min A with LRAD PT Recommendation Follow Up Recommendations: Home health PT Patient destination: Home Equipment Recommended: To be determined   PT Evaluation Precautions/Restrictions Precautions Precautions: Fall Precaution Comments: LLE NWB Required Braces or Orthoses: Other Brace Other Brace: limb protector Restrictions Weight Bearing Restrictions: Yes LLE Weight Bearing: Non weight bearing Pain Interference Pain Interference Pain Effect on Sleep: 4. Almost constantly Pain Interference with Therapy Activities: 1. Rarely or not at all Pain Interference with Day-to-Day Activities: 1. Rarely or not at all Home Living/Prior [place] Living Arrangements: Alone Available Help at Discharge: Family;Available 24 hours/day Type of Home: House Home Access: Level entry Home Layout: One level Bathroom Shower/Tub: Chiropodist: Standard Bathroom Accessibility: Yes Additional Comments: Recent stay in CIR (discharged 12/28) LLE was not healing and turning black  Lives With: Alone;Other (Comment) Prior Function Level of Independence: Requires assistive device for independence;Needs assistance with homemaking  Able to Take Stairs?: No Driving: No Vocation: On disability Vocation Requirements: CVA 5 years prior (12/17) Vision/Perception  Vision - History Ability to See in Adequate Light: 0 Adequate Perception Perception: Within Functional Limits Praxis Praxis: Intact  Cognition Overall Cognitive Status: No family/caregiver present to determine baseline cognitive functioning Arousal/Alertness: Awake/alert Orientation Level: Oriented X4 Memory: Appears intact Awareness: Impaired Problem Solving:  Impaired Safety/Judgment: Impaired Comments: unsafe transfer method requring cues for safety Sensation Sensation Light Touch: Appears Intact Proprioception: Impaired by gross assessment Additional Comments: Pt c/o phantom pain "spasms" on L resiudal limb Coordination Gross Motor Movements are Fluid and Coordinated: No Fine Motor Movements are Fluid and Coordinated: No Coordination and Movement Description: grossly uncoordinated due to altered balance strategies, pain, weakness, decreased balance, and previous CVA resulting in L hemiparesis Finger Nose Finger Test: prior CVA - dysmetria LUE Heel Shin Test: WFL on RLE, decreased ROM and coordination on LLE Motor  Motor Motor: Hemiplegia;Abnormal postural alignment and control Motor - Skilled Clinical Observations: altered balance strategies due to L BKA, pain, weakness, decreased balance, and previous CVA resulting in L  hemiparesis  Trunk/Postural Assessment  Cervical Assessment Cervical Assessment: Exceptions to [facility] (forward head) Thoracic Assessment Thoracic Assessment: Exceptions to [facility] (kyphosis) Lumbar Assessment Lumbar Assessment: Exceptions to [facility] (posterior pelvic tilt) Postural Control Postural Control: Deficits on evaluation Protective Responses: delayed  Balance Balance Balance Assessed: Yes Static Sitting Balance Static Sitting - Balance Support: Feet supported;Bilateral upper extremity supported Static Sitting - Level of Assistance: 5: Stand by assistance (supervision) Dynamic Sitting Balance Dynamic Sitting - Balance Support: Feet supported;No upper extremity supported Dynamic Sitting - Level of Assistance: 5: Stand by assistance (supervision) Static Standing Balance Static Standing - Balance Support: Bilateral upper extremity supported (RW) Static Standing - Level of Assistance: 5: Stand by assistance (CGA) Dynamic Standing Balance Dynamic Standing - Balance Support: Bilateral upper extremity supported  (RW) Dynamic Standing - Level of Assistance: 4: Min assist Dynamic Standing - Comments: with transfers Extremity Assessment  RLE Assessment RLE Assessment: Exceptions to [facility] General Strength Comments: grossly generalized to 4-/5 LLE Assessment LLE Assessment: Exceptions to [facility] General Strength Comments: grossly generalized to 3+/5 ( hip flexion, hip abd/add) - limited by pain and limb guard  Care Tool Care Tool Bed Mobility Roll left and right activity   Roll left and right assist level: Supervision/Verbal cueing Roll left and right assistive device comment: flat bed using bedrail  Sit to lying activity        Lying to sitting on side of bed activity   Lying to sitting on side of bed assist level: the ability to move from lying on the back to sitting on the side of the bed with no back support.: Supervision/Verbal cueing Lying to sitting on side of bed assist device comment: the ability to move from lying on the back to sitting on the side of the bed with no back support.: flat bed using bedrail   Care Tool Transfers Sit to stand transfer   Sit to stand assist level: Minimal Assistance - Patient > 75%    Chair/bed transfer   Chair/bed transfer assist level: Minimal Assistance - Patient > 75% Chair/bed transfer assistive device: Engineering geologist transfer activity did not occur: Safety/medical concerns (pain, weakness)        Care Tool Locomotion Ambulation Ambulation activity did not occur: Safety/medical concerns (pain, weakness)        Walk 10 feet activity Walk 10 feet activity did not occur: Safety/medical concerns (pain, weakness)       Walk 50 feet with 2 turns activity Walk 50 feet with 2 turns activity did not occur: Safety/medical concerns (pain, weakness)      Walk 150 feet activity Walk 150 feet activity did not occur: Safety/medical concerns (pain, weakness)      Walk 10 feet on uneven surfaces activity Walk 10 feet on  uneven surfaces activity did not occur: Safety/medical concerns (pain, weakness)      Stairs Stair activity did not occur: Safety/medical concerns (pain, weakness)        Walk up/down 1 step activity Walk up/down 1 step or curb (drop down) activity did not occur: Safety/medical concerns (pain, weakness)      Walk up/down 4 steps activity Walk up/down 4 steps activity did not occur: Safety/medical concerns (pain, weakness)      Walk up/down 12 steps activity Walk up/down 12 steps activity did not occur: Safety/medical concerns (pain, weakness)      Pick up small objects from floor Pick up small object from  the floor (from standing position) activity did not occur: Safety/medical concerns (pain, weakness)      Wheelchair Is the patient using a wheelchair?: Yes Type of Wheelchair: Manual   Wheelchair assist level: Dependent - Patient 0%    Wheel 50 feet with 2 turns activity   Assist Level: Dependent - Patient 0%  Wheel 150 feet activity   Assist Level: Dependent - Patient 0%    Refer to Care Plan for Long Term Goals  SHORT TERM GOAL WEEK 1 PT Short Term Goal 1 (Week 1): STG=LTG due to LOS  Recommendations for other services: None   Skilled Therapeutic Intervention Evaluation completed (see details above and below) with education on PT POC and goals and individual treatment initiated with focus on functional mobility/transfers, generalized strengthening, and amputee education. Received pt semi-reclined in bed, pt educated on PT evaluation, CIR polices, and therapy schedule and agreeable. Pt reported pain 4/10 in L residual limb increasing to 5/10 with mobility but declined pain medication. Of note, pt extremely hyperverbal, requiring significantly increased time with all mobility. Re-wrapped L residual limb and donned limb guard with max A. Educated pt on importance of keeping limb in extension to prevent contractures, then provided pt with 18x18 manual WC, cushion, and L amputee  support pad. Pt transferred supine<>sitting EOB from flat bed using bedrails with supervision. Pt requested to "show" therapist how he got up with nursing this morning, turning RW sideways, holding on with 1 hand, and trying to hop. Returned to sitting and educated pt on safety concerns of this technique and demonstrated proper technique. Pt transferred bed<>WC stand<>pivot with RW and min A and concluded session with pt sitting in WC, needs within reach, and chair pad alarm on. Safety plan updated.   Mobility Bed Mobility Bed Mobility: Rolling Right;Rolling Left;Supine to Sit Rolling Right: Supervision/verbal cueing Rolling Left: Supervision/Verbal cueing Supine to Sit: Supervision/Verbal cueing Transfers Transfers: Sit to Stand;Stand to Sit;Stand Pivot Transfers Sit to Stand: Minimal Assistance - Patient > 75% Stand to Sit: Contact Guard/Touching assist Stand Pivot Transfers: Minimal Assistance - Patient > 75% Stand Pivot Transfer Details: Verbal cues for technique;Verbal cues for precautions/safety;Verbal cues for safe use of DME/AE Stand Pivot Transfer Details (indicate cue type and reason): verbal cues for AD safety when transferring as pt initally trying to turn RW sideways, hold on with 1 hand, and hop to [facility] Transfer (Assistive device): Rolling walker Locomotion  Gait Ambulation: No Gait Gait: No Stairs / Additional Locomotion Stairs: No Wheelchair Mobility Wheelchair Mobility: No   Discharge Criteria: Patient will be discharged from PT if patient refuses treatment 3 consecutive times without medical reason, if treatment goals not met, if there is a change in medical status, if patient makes no progress towards goals or if patient is discharged from hospital.  The above assessment, treatment plan, treatment alternatives and goals were discussed and mutually agreed upon: by patient  Alfonse Alpers PT, DPT  03/31/2021, 12:12 PM

## 2021-03-31 NOTE — Progress Notes (Signed)
PROGRESS NOTE   Subjective/Complaints: No new complaints Discussed increasing metformin to 650mg  BID for better CBG control.  Asks for Ensure BID order  ROS: pain well controlled  Objective:   No results found. Recent Labs    03/29/21 1219 03/29/21 2223 03/30/21 0837  WBC 13.5*  --  10.2  HGB 6.9* 8.6* 8.5*  HCT 20.3* 24.9* 25.1*  PLT 328  --  290   No results for input(s): NA, K, CL, CO2, GLUCOSE, BUN, CREATININE, CALCIUM in the last 72 hours.  Intake/Output Summary (Last 24 hours) at 03/31/2021 1932 Last data filed at 03/31/2021 1800 Gross per 24 hour  Intake 960 ml  Output 2925 ml  Net -1965 ml        Physical Exam: Vital Signs Blood pressure 115/72, pulse 86, temperature 97.9 F (36.6 C), temperature source Oral, resp. rate 17, height 5\' 7"  (1.702 m), weight 91.1 kg, SpO2 100 %.  Gen: no distress, normal appearing HEENT: oral mucosa pink and moist, NCAT Cardio: Reg rate Chest: normal effort, normal rate of breathing Abd: soft, non-distended Ext: no edema Psych: pleasant, normal affect Skin:    Comments: Left BKA site with limb guard protector as well as wound VAC.  Neurological:     Comments: Patient is alert and a bit anxious.  Makes eye contact with examiner.  He does recall his latest CIR admission.  Oriented x3.   Assessment/Plan: 1. Functional deficits which require 3+ hours per day of interdisciplinary therapy in a comprehensive inpatient rehab setting. Physiatrist is providing close team supervision and 24 hour management of active medical problems listed below. Physiatrist and rehab team continue to assess barriers to discharge/monitor patient progress toward functional and medical goals  Care Tool:  Bathing    Body parts bathed by patient: Right arm, Left arm, Chest, Abdomen, Front perineal area, Right upper leg, Left upper leg, Face, Buttocks, Right lower leg     Body parts n/a: Left  lower leg (amputation)   Bathing assist Assist Level: Minimal Assistance - Patient > 75%     Upper Body Dressing/Undressing Upper body dressing   What is the patient wearing?: Pull over shirt    Upper body assist Assist Level: Set up assist    Lower Body Dressing/Undressing Lower body dressing      What is the patient wearing?: Pants     Lower body assist Assist for lower body dressing: Minimal Assistance - Patient > 75%     Toileting Toileting    Toileting assist Assist for toileting: Minimal Assistance - Patient > 75% Assistive Device Comment:  (urinal)   Transfers Chair/bed transfer  Transfers assist     Chair/bed transfer assist level: Minimal Assistance - Patient > 75% Chair/bed transfer assistive device: Programmer, multimedia   Ambulation assist   Ambulation activity did not occur: Safety/medical concerns (pain, weakness)          Walk 10 feet activity   Assist  Walk 10 feet activity did not occur: Safety/medical concerns (pain, weakness)        Walk 50 feet activity   Assist Walk 50 feet with 2 turns activity did not occur: Safety/medical concerns (  pain, weakness)         Walk 150 feet activity   Assist Walk 150 feet activity did not occur: Safety/medical concerns (pain, weakness)         Walk 10 feet on uneven surface  activity   Assist Walk 10 feet on uneven surfaces activity did not occur: Safety/medical concerns (pain, weakness)         Wheelchair     Assist Is the patient using a wheelchair?: Yes Type of Wheelchair: Manual    Wheelchair assist level: Dependent - Patient 0%      Wheelchair 50 feet with 2 turns activity    Assist        Assist Level: Dependent - Patient 0%   Wheelchair 150 feet activity     Assist      Assist Level: Dependent - Patient 0%   Blood pressure 115/72, pulse 86, temperature 97.9 F (36.6 C), temperature source Oral, resp. rate 17, height 5\' 7"  (1.702  m), weight 91.1 kg, SpO2 100 %.  Medical Problem List and Plan: 1. 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              -ELOS/Goals: 10-14 days S             Initial CIR evaluations today.   Add Ensure BID as per patient's request for wound healing 2.  Antithrombotics: -DVT/anticoagulation:  Pharmaceutical: Heparin             -antiplatelet therapy: Aspirin 81 mg daily and Plavix 75 mg daily 3. Pain Management: Baclofen 10 mg 3 times daily as needed, hydrocodone as needed 4. Mood/PTSD: Provide emotional support             -antipsychotic agents: N/A 5. Neuropsych: This patient is capable of making decisions on his own behalf. 6. Skin/Wound Care: Routine skin checks 7. Fluids/Electrolytes/Nutrition: Routine in and outs with follow-up chemistries 8.  Acute on chronic anemia.  Follow-up CBC 9.  Hypertension.  Decrease Norvasc to 5 mg daily.  Monitor with increased mobility 10.  Diabetes mellitus with peripheral neuropathy.  Latest hemoglobin A1c 6.1.  Increase Glucophage 850 mg twice daily.  Discussed with patient. Check blood sugars before meals and at bedtime 11.  Hyperlipidemia.  Continue Lipitor 12.  History of CVA with left-sided residual weakness.  Continue aspirin    LOS: 1 days A FACE TO FACE EVALUATION WAS PERFORMED  Izora Ribas 03/31/2021, 7:32 PM

## 2021-03-31 NOTE — Progress Notes (Signed)
Inpatient Rehabilitation Admission Medication Review by a Pharmacist - Follow-Up  A complete drug regimen review was completed for this patient to identify any potential clinically significant medication issues.  High Risk Drug Classes Is patient taking? Indication by Medication  Antipsychotic No   Anticoagulant Yes Heparin - DVT px  Antibiotic No   Opioid Yes Vicodin for pain  Antiplatelet Yes ASA/plavix - CVA  Hypoglycemics/insulin Yes SSI/metformin - DM  Vasoactive Medication Yes Amlodipine - HTN  Chemotherapy No   Other Yes Protonix- GERD Baclofen - spasms Atorvastatin - HLD/PAD     Type of Medication Issue Identified Description of Issue Recommendation(s)  Drug Interaction(s) (clinically significant)     Duplicate Therapy     Allergy     No Medication Administration End Date     Incorrect Dose     Additional Drug Therapy Needed  Lantus, lisinopril-hydrochlorothiazide, lurasidone, percocet, zoloft, trazodone Discussed with Dr. Carlis Abbott - will resume as appropriate in CIR.   Significant med changes from prior encounter (inform family/care partners about these prior to discharge).    Other       Clinically significant medication issues were identified that warrant physician communication and completion of prescribed/recommended actions by midnight of the next day:  Yes  Name of provider notified for urgent issues identified: Dr. Sula Soda   Provider Method of Notification: Secure chat  Pharmacist comments: Messaged Dr. Carlis Abbott regarding PTA medications noted in the discharge summary (lantus, lisinopril-hydrochlorothiazide, lurasidone, percocet, zoloft, trazodone) that have not been restarted in CIR - plans to discuss with patient and resume as appropriate.   Time spent performing this drug regimen review (minutes):  20   [name], PharmD PGY1 Pharmacy Resident 03/31/2021 2:38 PM   Please check AMION for all [facility] Pharm[facility] phone numbers After 10:00 PM, call  Main Pharmacy [phone]

## 2021-03-31 NOTE — Progress Notes (Signed)
Physical Therapy Session Note  Patient Details  Name: Adam Perez MRN: [id number] Date of Birth: 03-28-1955  Today's Date: 03/31/2021 PT Individual Time: 1415-1454 PT Individual Time Calculation (min): 39 min   Short Term Goals: Week 1:  PT Short Term Goal 1 (Week 1): STG=LTG due to LOS  Skilled Therapeutic Interventions/Progress Updates:   Received pt sitting in WC, pt agreeable to PT treatment, and reported pain 3/10 in L residual limb increasing to 5/10 with exercises - RN present to provide pain medication at end of session. Session with emphasis on functional mobility/transfers, generalized strengthening, dynamic standing balance/coordination, amputee education, and improved endurance. Pt requested to use urinal and able to do so mod I from seated position. Pt performed WC mobility 185ft x 2 trials using RUE/RLE with supervision to/from dayroom and transferred WC<>mat stand<>pivot with RW and min A and sit<>supine with supervision. Pt performed the following exercises with supervision and verbal cues for technique with emphasis on LE strength/ROM and contracture prevention: -LLE SLR 2x15 -RLE single leg bridge 2x10 -prone RLE hamstring curls 2x10 -supine L hip abduction 2x8 Of note, pt with noticeable L hip flexion contracture with difficulty resting LLE down on mat, actually holding it 1in above mat. Attempted prone LLE hip extension to further stretch hip flexors, however pt unable to lift LLE due to weakness and previous L hemiparesis. Also attempted getting into R sidelying, but pt unable to roll into position due to weakness and L hemiparesis. Pt only able to tolerate lying in prone for 1 minute before reporting increased cramping in L hip flexor - emphasized importance of lying flat in bed and continuing to practice lying in prone during therapy sessions to prevent further contracture. Supine<>sitting EOM with supervision and mat<>WC stand<>pivot with RW and min A. Pt performed WC  mobility 179ft using RUE/RLE and supervision back to room. Concluded session with pt sitting in WC, needs within reach, and chair pad alarm on.   Therapy Documentation Precautions:  Restrictions Weight Bearing Restrictions: Yes LLE Weight Bearing: Non weight bearing  Therapy/Group: Individual Therapy Alfonse Alpers PT, DPT   03/31/2021, 7:17 AM

## 2021-04-01 LAB — GLUCOSE, CAPILLARY
Glucose-Capillary: 103 mg/dL — ABNORMAL HIGH (ref 70–99)
Glucose-Capillary: 136 mg/dL — ABNORMAL HIGH (ref 70–99)
Glucose-Capillary: 116 mg/dL — ABNORMAL HIGH (ref 70–99)
Glucose-Capillary: 112 mg/dL — ABNORMAL HIGH (ref 70–99)

## 2021-04-01 NOTE — Discharge Instructions (Addendum)
Inpatient Rehab Discharge Instructions  [name] Discharge date and time: No discharge date for patient encounter.   Activities/Precautions/ Functional Status: Activity: As tolerated Diet: Diabetic diet Wound Care: Routine skin checks Functional status:  ___ No restrictions     ___ Walk up steps independently ___ 24/7 supervision/assistance   ___ Walk up steps with assistance ___ Intermittent supervision/assistance  ___ Bathe/dress independently ___ Walk with walker     _x__ Bathe/dress with assistance ___ Walk Independently    ___ Shower independently ___ Walk with assistance    ___ Shower with assistance ___ No alcohol     ___ Return to work/school ________  COMMUNITY REFERRALS UPON DISCHARGE:    Home Health:   PT     OT                     Agency: TBD  Phone:    Medical Equipment/Items Ordered: Arts development officer                                                  Agency/Supplier: [employer]    Special Instructions: No driving smoking or alcohol   My questions have been answered and I understand these instructions. I will adhere to these goals and the provided educational materials after my discharge from the hospital.  Patient/Caregiver Signature _______________________________ Date __________  Clinician Signature _______________________________________ Date __________  Please bring this form and your medication list with you to all your follow-up doctor's appointments.

## 2021-04-02 LAB — COMPREHENSIVE METABOLIC PANEL
ALT: 55 U/L — ABNORMAL HIGH (ref 0–44)
AST: 42 U/L — ABNORMAL HIGH (ref 15–41)
Albumin: 2.8 g/dL — ABNORMAL LOW (ref 3.5–5.0)
Alkaline Phosphatase: 72 U/L (ref 38–126)
Anion gap: 9 (ref 5–15)
BUN: 15 mg/dL (ref 8–23)
CO2: 24 mmol/L (ref 22–32)
Calcium: 9 mg/dL (ref 8.9–10.3)
Chloride: 105 mmol/L (ref 98–111)
Creatinine, Ser: 1 mg/dL (ref 0.61–1.24)
GFR, Estimated: >60 mL/min (ref >60)
Glucose, Bld: 101 mg/dL — ABNORMAL HIGH (ref 70–99)
Potassium: 4 mmol/L (ref 3.5–5.1)
Sodium: 138 mmol/L (ref 135–145)
Total Bilirubin: 0.4 mg/dL (ref 0.3–1.2)
Total Protein: 6.4 g/dL — ABNORMAL LOW (ref 6.5–8.1)

## 2021-04-02 LAB — CBC WITH DIFFERENTIAL/PLATELET
Abs Immature Granulocytes: 0.06 10*3/uL (ref 0.00–0.07)
Basophils Absolute: 0 10*3/uL (ref 0.0–0.1)
Basophils Relative: 1 %
Eosinophils Absolute: 0.1 10*3/uL (ref 0.0–0.5)
Eosinophils Relative: 1 %
HCT: 26 % — ABNORMAL LOW (ref 39.0–52.0)
Hemoglobin: 8.9 g/dL — ABNORMAL LOW (ref 13.0–17.0)
Immature Granulocytes: 1 %
Lymphocytes Relative: 22 %
Lymphs Abs: 1.9 10*3/uL (ref 0.7–4.0)
MCH: 29.7 pg (ref 26.0–34.0)
MCHC: 34.2 g/dL (ref 30.0–36.0)
MCV: 86.7 fL (ref 80.0–100.0)
Monocytes Absolute: 0.8 10*3/uL (ref 0.1–1.0)
Monocytes Relative: 10 %
Neutro Abs: 5.7 10*3/uL (ref 1.7–7.7)
Neutrophils Relative %: 65 %
Platelets: 359 10*3/uL (ref 150–400)
RBC: 3 MIL/uL — ABNORMAL LOW (ref 4.22–5.81)
RDW: 15.4 % (ref 11.5–15.5)
WBC: 8.6 10*3/uL (ref 4.0–10.5)
nRBC: 0 % (ref 0.0–0.2)

## 2021-04-02 LAB — GLUCOSE, CAPILLARY
Glucose-Capillary: 104 mg/dL — ABNORMAL HIGH (ref 70–99)
Glucose-Capillary: 117 mg/dL — ABNORMAL HIGH (ref 70–99)
Glucose-Capillary: 118 mg/dL — ABNORMAL HIGH (ref 70–99)
Glucose-Capillary: 126 mg/dL — ABNORMAL HIGH (ref 70–99)

## 2021-04-02 NOTE — Progress Notes (Signed)
PROGRESS NOTE   Subjective/Complaints: No new complaints this morning.  Discussed lab results from today Advised that CBGs are a little elevated, and he should continue to avoid foods with added sugar.   ROS: pain well controlled, denies constipation, last BM 1/22.   Objective:   No results found. Recent Labs    04/02/21 0640  WBC 8.6  HGB 8.9*  HCT 26.0*  PLT 359   Recent Labs    04/02/21 0640  NA 138  K 4.0  CL 105  CO2 24  GLUCOSE 101*  BUN 15  CREATININE 1.00  CALCIUM 9.0    Intake/Output Summary (Last 24 hours) at 04/02/2021 1635 Last data filed at 04/02/2021 1300 Gross per 24 hour  Intake 893 ml  Output 1025 ml  Net -132 ml        Physical Exam: Vital Signs Blood pressure 113/67, pulse 86, temperature 98.3 F (36.8 C), resp. rate 18, height 5\' 7"  (1.702 m), weight 91.1 kg, SpO2 94 %. Gen: no distress, normal appearing HEENT: oral mucosa pink and moist, NCAT Cardio: Reg rate Chest: normal effort, normal rate of breathing Abd: soft, non-distended Ext: no edema Psych: pleasant, normal affect Skin:    Comments: Left BKA site with limb guard protector as well as wound VAC.  Neurological:     Comments: Patient is alert and a bit anxious.  Makes eye contact with examiner.  He does recall his latest CIR admission.  Oriented x3.   Assessment/Plan: 1. Functional deficits which require 3+ hours per day of interdisciplinary therapy in a comprehensive inpatient rehab setting. Physiatrist is providing close team supervision and 24 hour management of active medical problems listed below. Physiatrist and rehab team continue to assess barriers to discharge/monitor patient progress toward functional and medical goals  Care Tool:  Bathing    Body parts bathed by patient: Right arm, Left arm, Chest, Abdomen, Right upper leg, Left upper leg, Face, Right lower leg     Body parts n/a: Left lower leg  (amputation)   Bathing assist Assist Level: Set up assist (seated only at sink)     Upper Body Dressing/Undressing Upper body dressing   What is the patient wearing?: Pull over shirt    Upper body assist Assist Level: Independent with assistive device    Lower Body Dressing/Undressing Lower body dressing      What is the patient wearing?: Pants     Lower body assist Assist for lower body dressing: Minimal Assistance - Patient > 75%     Toileting Toileting    Toileting assist Assist for toileting: Minimal Assistance - Patient > 75% Assistive Device Comment:  (urinal)   Transfers Chair/bed transfer  Transfers assist     Chair/bed transfer assist level: Minimal Assistance - Patient > 75% Chair/bed transfer assistive device:   Ambulation assist   Ambulation activity did not occur: Safety/medical concerns (pain, weakness)          Walk 10 feet activity   Assist  Walk 10 feet activity did not occur: Safety/medical concerns (pain, weakness)        Walk 50 feet activity   Assist Walk 50  feet with 2 turns activity did not occur: Safety/medical concerns (pain, weakness)         Walk 150 feet activity   Assist Walk 150 feet activity did not occur: Safety/medical concerns (pain, weakness)         Walk 10 feet on uneven surface  activity   Assist Walk 10 feet on uneven surfaces activity did not occur: Safety/medical concerns (pain, weakness)         Wheelchair     Assist Is the patient using a wheelchair?: Yes Type of Wheelchair: Manual    Wheelchair assist level: Supervision/Verbal cueing Max wheelchair distance: 114ft    Wheelchair 50 feet with 2 turns activity    Assist        Assist Level: Supervision/Verbal cueing   Wheelchair 150 feet activity     Assist      Assist Level: Supervision/Verbal cueing   Blood pressure 113/67, pulse 86, temperature 98.3 F (36.8 C), resp. rate  18, height 5\' 7"  (1.702 m), weight 91.1 kg, SpO2 94 %.  Medical Problem List and Plan: 1. 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              -ELOS/Goals: 10-14 days S            Continue CIR  Add Ensure BID as per patient's request for wound healing 2.  Antithrombotics: -DVT/anticoagulation:  Pharmaceutical: Heparin             -antiplatelet therapy: Aspirin 81 mg daily and Plavix 75 mg daily 3. Pain Management: Baclofen 10 mg 3 times daily as needed, hydrocodone as needed 4. Mood/PTSD: Provide emotional support             -antipsychotic agents: N/A 5. Neuropsych: This patient is capable of making decisions on his own behalf. 6. Skin/Wound Care: Routine skin checks 7. Fluids/Electrolytes/Nutrition: Routine in and outs with follow-up chemistries 8.  Acute on chronic anemia.  Hgb improving, discussed with patient.  9.  Hypertension.  Decrease Norvasc to 5 mg daily.  Monitor with increased mobility 10.  Diabetes mellitus with peripheral neuropathy.  Latest hemoglobin A1c 6.1.  Increase Glucophage 850 mg twice daily.  Discussed with patient. Check blood sugars before meals and at bedtime. Recommended avoiding foods with added sugars.  11.  Hyperlipidemia.  Continue Lipitor 12.  History of CVA with left-sided residual weakness.  Continue aspirin 13. Transaminitis: repeat CMP next week 14. Low protein: recommended choosing high protein foods.     LOS: 3 days A FACE TO FACE EVALUATION WAS PERFORMED  03/28/2021 04/02/2021, 4:35 PM

## 2021-04-02 NOTE — Progress Notes (Signed)
°  Progress Note    04/02/2021 7:47 AM * No surgery found *  Subjective:  no complaints   Vitals:   04/01/21 2010 04/02/21 0604  BP: 115/79 110/74  Pulse: 95 85  Resp: 16 19  Temp: 98.3 F (36.8 C) 98.3 F (36.8 C)  SpO2: 100% 99%    Physical Exam: Incisions:  L BKA edematous but dry; viable flap   CBC    Component Value Date/Time   WBC 8.6 04/02/2021 0640   RBC 3.00 (L) 04/02/2021 0640   HGB 8.9 (L) 04/02/2021 0640   HCT 26.0 (L) 04/02/2021 0640   PLT 359 04/02/2021 0640   MCV 86.7 04/02/2021 0640   MCH 29.7 04/02/2021 0640   MCHC 34.2 04/02/2021 0640   RDW 15.4 04/02/2021 0640   LYMPHSABS 1.9 04/02/2021 0640   MONOABS 0.8 04/02/2021 0640   EOSABS 0.1 04/02/2021 0640   BASOSABS 0.0 04/02/2021 0640    BMET    Component Value Date/Time   NA 136 03/28/2021 0212   K 3.6 03/28/2021 0212   CL 104 03/28/2021 0212   CO2 24 03/28/2021 0212   GLUCOSE 118 (H) 03/28/2021 0212   BUN 14 03/28/2021 0212   CREATININE 1.00 03/28/2021 0212   CALCIUM 9.0 03/28/2021 0212   GFRNONAA >60 03/28/2021 0212   GFRAA >60 07/06/2019 0407   GFRAA >60 07/06/2019 0407    INR    Component Value Date/Time   INR 1.1 03/26/2021 0555     Intake/Output Summary (Last 24 hours) at 04/02/2021 0747 Last data filed at 04/02/2021 0606 Gross per 24 hour  Intake 1196 ml  Output 1375 ml  Net -179 ml     Assessment/Plan:  66 y.o. male is s/p left below knee amputation  - Dressing changed today, stump improved from last week.  Less edematous.  Continue daily dressing changes with kerlex and ace wrap or compression sock to improve edema - Vascular will continue to follow intermittently   Emilie Rutter, PA-C [facility] [phone] 04/02/2021 7:47 AM

## 2021-04-02 NOTE — IPOC Note (Signed)
Overall Plan of Care [facility]) Patient Details Name: Adam Perez MRN: [id number] DOB: 10-10-1955  Admitting Diagnosis: Left below-knee amputee [facility])  Hospital Problems: Principal Problem:   Left below-knee amputee [facility])     Functional Problem List: Nursing Bowel, Endurance, Pain, Medication Management, Skin Integrity, Edema  PT Balance, Edema, Endurance, Motor, Nutrition, Pain, Perception, Safety, Skin Integrity, Behavior  OT Balance, Endurance, Motor, Pain, Safety  SLP    TR         Basic ADLs: OT Grooming, Bathing, Dressing, Toileting     Advanced  ADLs: OT Simple Meal Preparation     Transfers: PT Bed Mobility, Bed to Chair, Car, Manufacturing systems engineer, Metallurgist: PT Ambulation, Emergency planning/management officer, Stairs     Additional Impairments: OT Fuctional Use of Upper Extremity, None  SLP        TR      Anticipated Outcomes Item Anticipated Outcome  Self Feeding independent  Swallowing      Basic self-care  supervision level  Toileting  supervision level   Bathroom Transfers supervision  Bowel/Bladder  mod I for bowel management  Transfers  supervision with LRAD  Locomotion  min A with LRAD  Communication     Cognition     Pain  at or below level 4 with prns  Safety/Judgment  maintain safety with cues   Therapy Plan: PT Intensity: Minimum of 1-2 x/day ,45 to 90 minutes PT Frequency: 5 out of 7 days PT Duration Estimated Length of Stay: 7-10 days OT Intensity: Minimum of 1-2 x/day, 45 to 90 minutes OT Frequency: 5 out of 7 days OT Duration/Estimated Length of Stay: 10-12 days     Due to the current state of emergency, patients may not be receiving their 3-hours of Medicare-mandated therapy.   Team Interventions: Nursing Interventions Disease Management/Prevention, Medication Management, Discharge Planning, Skin Care/Wound Management, Pain Management, Bowel Management, Patient/Family Education  PT interventions Ambulation/gait  training, Training and development officer, Cognitive remediation/compensation, Disease management/prevention, Discharge planning, Community reintegration, DME/adaptive equipment instruction, Functional mobility training, Patient/family education, Pain management, Neuromuscular re-education, Psychosocial support, Skin care/wound management, Splinting/orthotics, Therapeutic Exercise, Therapeutic Activities, Stair training, UE/LE Strength taining/ROM, UE/LE Coordination activities, Visual/perceptual remediation/compensation, Wheelchair propulsion/positioning, Functional electrical stimulation  OT Interventions Balance/vestibular training, Disease mangement/prevention, Neuromuscular re-education, Self Care/advanced ADL retraining, Therapeutic Exercise, Wheelchair propulsion/positioning, UE/LE Strength taining/ROM, Skin care/wound managment, Pain management, DME/adaptive equipment instruction, Cognitive remediation/compensation, Community reintegration, Functional electrical stimulation, Patient/family education, Splinting/orthotics, UE/LE Coordination activities, Visual/perceptual remediation/compensation, Therapeutic Activities, Psychosocial support, Functional mobility training, Discharge planning  SLP Interventions    TR Interventions    SW/CM Interventions Discharge Planning, Psychosocial Support, Patient/Family Education, Disease Management/Prevention   Barriers to Discharge MD  Medical stability  Nursing Lack of/limited family support, Wound Care, Weight bearing restrictions lives alone, ex wife to assist at discharge if needed, 1 level, level entry  PT Home environment access/layout, Weight bearing restrictions, Behavior, Wound Care pain, previous CVA, weakness/deconditoning, decreased safety awareness and understanding of deficits  OT Decreased caregiver support    SLP      SW Lack of/limited family support, Wound Care     Team Discharge Planning: Destination: PT-Home ,OT- Home , SLP-  Projected  Follow-up: PT-Home health PT, OT-  Home health OT, 24 hour supervision/assistance, SLP-  Projected Equipment Needs: PT-To be determined, OT- None recommended by OT, SLP-  Equipment Details: PT- , OT-  Patient/family involved in discharge planning: PT- Patient,  OT-Patient, SLP-   MD ELOS: 10-14 days S Medical  Rehab Prognosis:  Excellent Assessment: Adam Perez is a 66 year old man admitted to CIR with functional deficits secondary to 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 Medications are being managed, and labs and vitals are being monitored regularly.     See Team Conference Notes for weekly updates to the plan of care

## 2021-04-02 NOTE — Progress Notes (Signed)
Inpatient Rehabilitation  Patient information reviewed and entered into eRehab system by Ruthene Methvin M. Amere Iott, M.A., CCC/SLP, PPS Coordinator.  Information including medical coding, functional ability and quality indicators will be reviewed and updated through discharge.    

## 2021-04-02 NOTE — Progress Notes (Signed)
Inpatient Rehabilitation Center Individual Statement of Services  Patient Name:  Adam Perez  Date:  04/02/2021  Welcome to the Inpatient Rehabilitation Center.  Our goal is to provide you with an individualized program based on your diagnosis and situation, designed to meet your specific needs.  With this comprehensive rehabilitation program, you will be expected to participate in at least 3 hours of rehabilitation therapies Monday-Friday, with modified therapy programming on the weekends.  Your rehabilitation program will include the following services:  Physical Therapy (PT), Occupational Therapy (OT), Speech Therapy (ST), 24 hour per day rehabilitation nursing, Therapeutic Recreaction (TR), Neuropsychology, Care Coordinator, Rehabilitation Medicine, Pharmacy Services, and Other  Weekly team conferences will be held on Wednesdays to discuss your progress.  Your Inpatient Rehabilitation Care Coordinator will talk with you frequently to get your input and to update you on team discussions.  Team conferences with you and your family in attendance may also be held.  Expected length of stay:  14-16 Days  Overall anticipated outcome:  Supervision  Depending on your progress and recovery, your program may change. Your Inpatient Rehabilitation Care Coordinator will coordinate services and will keep you informed of any changes. Your Inpatient Rehabilitation Care Coordinator's name and contact numbers are listed  below.  The following services may also be recommended but are not provided by the Inpatient Rehabilitation Center:   Home Health Rehabiltiation Services Outpatient Rehabilitation Services    Arrangements will be made to provide these services after discharge if needed.  Arrangements include referral to agencies that provide these services.  Your insurance has been verified to be:   Medicare A & B Your primary doctor is:  [facility]  Pertinent information will be shared with  your doctor and your insurance company.  Inpatient Rehabilitation Care Coordinator:  Lavera Guise, [place] [phone] or [phone]  Information discussed with and copy given to patient by: Andria Rhein, 04/02/2021, 10:15 AM

## 2021-04-02 NOTE — Progress Notes (Signed)
Inpatient Rehabilitation Care Coordinator Assessment and Plan Patient Details  Name: ASLAN HIMES MRN: [id number] Date of Birth: [birth year]  Today's Date: 04/02/2021  Hospital Problems: Principal Problem:   Left below-knee amputee [facility]  Past Medical History:  Past Medical History:  Diagnosis Date   Anemia    COVID 2022   mild case   Diabetes mellitus without complication ([place])    Hemiparesis ([place])    left side; residual from prior acute ischemic CVA   HTN (hypertension)    Ischemic cerebrovascular accident (CVA) ([place])    with residual left hemiparesis   PTSD (post-traumatic stress disorder)    Past Surgical History:  Past Surgical History:  Procedure Laterality Date   AMPUTATION Left 03/26/2021   Procedure: LEFT BELOW KNEE AMPUTATION;  Surgeon: Cherre Robins, MD;  Location: [place];  Service: Vascular;  Laterality: Left;   ANGIOPLASTY Left 02/19/2021   Procedure: POPLITEAL ANGIOPLASTY;  Surgeon: Cherre Robins, MD;  Location: [place];  Service: Vascular;  Laterality: Left;   APPLICATION OF WOUND VAC Left 03/26/2021   Procedure: APPLICATION OF INCISIONAL WOUND [place];  Surgeon: Cherre Robins, MD;  Location: [place];  Service: Vascular;  Laterality: Left;   LOWER EXTREMITY ANGIOGRAM Left 02/19/2021   Procedure: LEFT LOWER EXTREMITY ANGIOGRAM;  Surgeon: Cherre Robins, MD;  Location: [place];  Service: Vascular;  Laterality: Left;   LOWER EXTREMITY ANGIOGRAPHY N/A 02/15/2021   Procedure: LOWER EXTREMITY ANGIOGRAPHY;  Surgeon: Cherre Robins, MD;  Location: [place] CV [facility];  Service: Cardiovascular;  Laterality: N/A;   TRANSMETATARSAL AMPUTATION Left 02/19/2021   Procedure: LEFT TRANSMETATARSAL AMPUTATION;  Surgeon: Cherre Robins, MD;  Location: [place];  Service: Vascular;  Laterality: Left;   ULTRASOUND GUIDANCE FOR VASCULAR ACCESS Right 02/19/2021   Procedure: ULTRASOUND GUIDANCE FOR RIGHT COMMON FEMORAL ARTERY ACCESS;  Surgeon: Cherre Robins, MD;  Location: [place];   Service: Vascular;  Laterality: Right;   WOUND DEBRIDEMENT Left 02/19/2021   Procedure: LEFT CALF VENOUS ULCER SHARP EXCISIONAL DEBRIDEMENT;  Surgeon: Cherre Robins, MD;  Location: [place];  Service: Vascular;  Laterality: Left;   Social History:  reports that he has never smoked. He has never used smokeless tobacco. He reports that he does not drink alcohol and does not use drugs.  Family / Support Systems Patient Roles: Spouse Spouse/Significant Other: Deneise Lever Anticipated Caregiver: Deneise Lever (Ex Wife) Ability/Limitations of Caregiver: none Caregiver Availability: 24/7 Family Dynamics: support from spouse and friends  Social History Preferred language: English Religion:  Health Literacy - How often do you need to have someone help you when you read instructions, pamphlets, or other written material from your doctor or pharmacy?: Never Writes: Yes   Abuse/Neglect Abuse/Neglect Assessment Can Be Completed: Yes Physical Abuse: Denies Verbal Abuse: Denies Sexual Abuse: Denies Exploitation of patient/patient's resources: Denies Self-Neglect: Denies  Patient response to: Social Isolation - How often do you feel lonely or isolated from those around you?: Never  Emotional Status Recent Psychosocial Issues: hx of PTSD, Coping Psychiatric History: hx of PTSD Substance Abuse History: n/a  Patient / Family Perceptions, Expectations & Goals Pt/Family understanding of illness & functional limitations: yes Premorbid pt/family roles/activities: required some assistance overall previously Anticipated changes in roles/activities/participation: spouse able to provide assistance PTA Pt/family expectations/goals: supervision  [place] Agencies: None Premorbid Home Care/DME Agencies: Other (Comment) ([facility], [employer], [employer], [place], [employer]) Transportation available at discharge: spouse able to transport Is the patient able to respond to transportation needs?: Yes In  the past  12 months, has lack of transportation kept you from medical appointments or from getting medications?: No In the past 12 months, has lack of transportation kept you from meetings, work, or from getting things needed for daily living?: No Resource referrals recommended: Neuropsychology  Discharge Planning Living Arrangements: Alone Support Systems: Spouse/significant other Type of Residence: Private residence Insurance Resources: Chartered certified accountant Resources: Family Support Financial Screen Referred: No Living Expenses: Lives with family Money Management: Patient, Spouse Does the patient have any problems obtaining your medications?: No Home Management: spouse assisted Patient/Family Preliminary Plans: spouse able to cont. Care Coordinator Barriers to Discharge: Lack of/limited family support, Wound Care Care Coordinator Anticipated Follow Up Needs: HH/OP DC Planning Additional Notes/Comments: lives alone, spouse able to assit if needed at d/c Expected length of stay: 14-16 Days  Clinical Impression SW met with patient, introduced self, explained role and addressed questions. No concerns currently, sw will cont to follow up with patient on Wednesday  Dyanne Iha 04/02/2021, 12:52 PM

## 2021-04-02 NOTE — Progress Notes (Signed)
Occupational Therapy Session Note  Patient Details  Name: Adam Perez MRN: [id number] Date of Birth: 08-Jul-1955  Today's Date: 04/02/2021 OT Individual Time: 4098-1191 & 4782-9562 OT Individual Time Calculation (min): 54 min & 68 min   Short Term Goals: Week 1:  OT Short Term Goal 1 (Week 1): Pt will complete LB bathing sit to stand with min guard assist for two consecutive sessions. OT Short Term Goal 2 (Week 1): Pt will complete LB dressing with min guard sit to stand including donning limb guard on the LLE. OT Short Term Goal 3 (Week 1): Pt will complete toilet transfer at min guard assist to the 3:1 with use of the RW for support.  Skilled Therapeutic Interventions/Progress Updates:  Session 1 Skilled OT intervention completed with focus on ADL retraining, activity tolerance, functional transfers. Pt received seated in w/c, agreeable to session. Note- pt very tangential during session, requiring re-directive cues throughout. Pt requesting to bathe, shave and brush teeth. Pt set up at sink seated in w/c, able to complete oral care with mod I, shaving of face with supervision to ensure no cuts or active bleeding. Pt completed bathing with set up A for seated bathing only, with pt declining to remove pants during bathing or to change into fresh clothes this session. Pt re-donned shirt with mod I. Note- pt found to have IV removed on R hand, with pt stating "I didn't want to tell them because it's uncomfortable being in" with pt reporting being unaware that it had come out. LPN made aware. Therapist applied lotion and donned new sock with total A to R foot per request. Pt with questions about contractures/prosthetics, with therapist educating on why PT had him lay flat and other methods to prevent worsening contractures. Pt left seated in w/c, with chair alarm on, and all needs in reach at end of session.  Session 2 Skilled OT intervention completed with focus on dynamic standing balance, mass  practice sit <> stands, LUE active wrist supination and in hand manipulation tasks. Pt received seated in w/c, agreeable to session. Pt self-propelled in w/c with R hand and R leg <> therapy gym with supervision. Pt completed sit > stand with CGA using RW, then stand pivot <> EOM with CGA with pt needing cues for pivoting foot vs hopping for safety due to decreased stability on LUE from previous stroke which impacts pt's ability to grip walker. Pt participated in dynamic standing balance and mass practice sit <> stands with focus on descent control on sitting to promote increased stability needed for self-care and functional transfers during corn hole toss game. Pt initially requiring min A to correct balance while tossing with R hand and using RW with L, however with cues for balance strategies, assist fading to CGA. Pt required seated rest breaks due to fatigue during sit <> stand trials before tossing of bean bags. Note- pt with limited supination of wrist due to flexor tone from prior stroke, with several hand over hand cues needed when pt placing L hand on walker prior to standing. While seated EOM, pt participated in table top tasks using cards to promote supination of wrist during card flipping, with light facilitation provided at elbow and wrist, with pt demonstrating improved movement. Pt also completed palm to finger in hand manipulation using playing card to separate all cards into matching piles, to promote in hand manipulation skills needed during self-care. Pt required verbal cues to start palmar then shift to finger vs compensating. Issued pt  an Designer, multimedia with brief education provided on importance of skin inspection following an amputation to ensure proper wound healing. Pt left seated in w/c with chair alarm on and all needs in reach at end of session.    Therapy Documentation Precautions:  Precautions Precautions: Fall Precaution Comments: LLE NWB Required Braces or Orthoses: Other  Brace Other Brace: limb protector Restrictions Weight Bearing Restrictions: Yes LLE Weight Bearing: Non weight bearing  Pain: 4/10 pain in residual limb, premedicated, repositioned limb-guard and limb on leg rest   Therapy/Group: Individual Therapy  Yifan Auker E Catheryn Slifer 04/02/2021, 7:31 AM

## 2021-04-02 NOTE — Progress Notes (Signed)
Physical Therapy Session Note  Patient Details  Name: Adam Perez MRN: [id number] Date of Birth: Dec 25, 1955  Today's Date: 04/02/2021 PT Individual Time: 4627-0350 PT Individual Time Calculation (min): 68 min   Short Term Goals: Week 1:  PT Short Term Goal 1 (Week 1): STG=LTG due to LOS  Skilled Therapeutic Interventions/Progress Updates:   Received pt sitting in WC, pt agreeable to PT treatment, and denied any pain at rest, increasing slightly during session - unrated. Noted blood on pt's shirt from getting blood drawn this morning. Therefore doffed dirty shirt, applied Band-Aid, and donned clean one with supervision. Session with emphasis on functional mobility/transfers, generalized strengthening and ROM, dynamic standing balance/coordination, and improved activity tolerance. Pt performed WC mobility 141ft x 2 trials using RUE/LE and supervision to/from main therapy gym. Pt transferred WC<>mat stand<>pivot with RW and CGA/min A and worked on dynamic standing balance tossing horseshoes using RUE x 2 trials with CGA/min A for balance. Pt then performed 2x5 "hops" in place on RLE with BUE support and CGA/min A - noted decreased RLE foot clearance. Pt with questions regarding ambulation. Explained recommendation to avoid ambulating long distances due to repetitive force on R knee as well as current L sided hemiparesis, thus affecting pt's grip strength on LUE and concern regarding LUE slipping off handgrip causing pt to lose balance and fall. Sit<>stand with RW and CGA and performed hip abduction LLE 2x10 and hip flexion LLE 2x10 with BUE support on RW and CGA/min A for balance. Sit<>supine with supervision and performed the following exercises with supervision and verbal cues for technique: -supine LLE SLR 2x10 -supine LLE hip abduction 2x10 -supine RLE single leg bridges 2x8 -supine chest press with 5lb dowel 3x10 -supine overhead shoulder flexion with 1lb dowel 3x10 -R sidelying L hip  abduction 1x15 Pt transferred supine<>prone rolling to R with supervision and cues for technique and stretched hip flexors for 3 minutes with emphasis on contracture prevention - limited by pain and tightness in hip however pt able to tolerate lying in prone for longer today compared to previous session. Supine<>sitting EOM with supervision and stand<>pivot mat<>WC with RW and min A - cues to stay within RW BOS. Concluded session with pt sitting in WC, needs within reach, and chair pad alarm on. Assisted pt with locating lost phone and provided pt with fresh ice water.   Therapy Documentation Precautions:  Precautions Precautions: Fall Precaution Comments: LLE NWB Required Braces or Orthoses: Other Brace Other Brace: limb protector Restrictions Weight Bearing Restrictions: Yes LLE Weight Bearing: Non weight bearing  Therapy/Group: Individual Therapy Martin Majestic PT, DPT   04/02/2021, 7:21 AM

## 2021-04-02 NOTE — Progress Notes (Signed)
Inpatient Rehabilitation Care Coordinator Assessment and Plan Patient Details  Name: Adam Perez MRN: [id number] Date of Birth: [birth year]  Today's Date: 04/02/2021  Hospital Problems: Principal Problem:   Left below-knee amputee [facility]  Past Medical History:  Past Medical History:  Diagnosis Date   Anemia    COVID 2022   mild case   Diabetes mellitus without complication ([place])    Hemiparesis ([place])    left side; residual from prior acute ischemic CVA   HTN (hypertension)    Ischemic cerebrovascular accident (CVA) ([place])    with residual left hemiparesis   PTSD (post-traumatic stress disorder)    Past Surgical History:  Past Surgical History:  Procedure Laterality Date   AMPUTATION Left 03/26/2021   Procedure: LEFT BELOW KNEE AMPUTATION;  Surgeon: Cherre Robins, MD;  Location: [place];  Service: Vascular;  Laterality: Left;   ANGIOPLASTY Left 02/19/2021   Procedure: POPLITEAL ANGIOPLASTY;  Surgeon: Cherre Robins, MD;  Location: [place];  Service: Vascular;  Laterality: Left;   APPLICATION OF WOUND VAC Left 03/26/2021   Procedure: APPLICATION OF INCISIONAL WOUND [place];  Surgeon: Cherre Robins, MD;  Location: [place];  Service: Vascular;  Laterality: Left;   LOWER EXTREMITY ANGIOGRAM Left 02/19/2021   Procedure: LEFT LOWER EXTREMITY ANGIOGRAM;  Surgeon: Cherre Robins, MD;  Location: [place];  Service: Vascular;  Laterality: Left;   LOWER EXTREMITY ANGIOGRAPHY N/A 02/15/2021   Procedure: LOWER EXTREMITY ANGIOGRAPHY;  Surgeon: Cherre Robins, MD;  Location: [place] [facility];  Service: Cardiovascular;  Laterality: N/A;   TRANSMETATARSAL AMPUTATION Left 02/19/2021   Procedure: LEFT TRANSMETATARSAL AMPUTATION;  Surgeon: Cherre Robins, MD;  Location: [place];  Service: Vascular;  Laterality: Left;   ULTRASOUND GUIDANCE FOR VASCULAR ACCESS Right 02/19/2021   Procedure: ULTRASOUND GUIDANCE FOR RIGHT COMMON FEMORAL ARTERY ACCESS;  Surgeon: Cherre Robins, MD;  Location: [place];   Service: Vascular;  Laterality: Right;   WOUND DEBRIDEMENT Left 02/19/2021   Procedure: LEFT CALF VENOUS ULCER SHARP EXCISIONAL DEBRIDEMENT;  Surgeon: Cherre Robins, MD;  Location: [place];  Service: Vascular;  Laterality: Left;   Social History:  reports that he has never smoked. He has never used smokeless tobacco. He reports that he does not drink alcohol and does not use drugs.  Family / Support Systems Patient Roles: Spouse Spouse/Significant Other: Deneise Lever Anticipated Caregiver: Deneise Lever (Ex Wife) Ability/Limitations of Caregiver: none Caregiver Availability: 24/7 Family Dynamics: support from spouse and friends  Social History Preferred language: English Religion:  Health Literacy - How often do you need to have someone help you when you read instructions, pamphlets, or other written material from your doctor or pharmacy?: Never Writes: Yes   Abuse/Neglect Abuse/Neglect Assessment Can Be Completed: Yes Physical Abuse: Denies Verbal Abuse: Denies Sexual Abuse: Denies Exploitation of patient/patient's resources: Denies Self-Neglect: Denies  Patient response to: Social Isolation - How often do you feel lonely or isolated from those around you?: Never  Emotional Status Recent Psychosocial Issues: hx of PTSD, Coping Psychiatric History: hx of PTSD Substance Abuse History: n/a  Patient / Family Perceptions, Expectations & Goals Pt/Family understanding of illness & functional limitations: yes Premorbid pt/family roles/activities: required some assistance overall previously Anticipated changes in roles/activities/participation: spouse able to provide assistance PTA Pt/family expectations/goals: supervision  [place] Agencies: None Premorbid Home Care/DME Agencies: Other (Comment) ([facility], [employer], [employer], [place], [employer]) Transportation available at discharge: spouse able to transport Is the patient able to respond to transportation needs?: Yes In  the past  12 months, has lack of transportation kept you from medical appointments or from getting medications?: No In the past 12 months, has lack of transportation kept you from meetings, work, or from getting things needed for daily living?: No Resource referrals recommended: Neuropsychology  Discharge Planning Living Arrangements: Alone Support Systems: Spouse/significant other Type of Residence: Private residence Insurance Resources: Chartered certified accountant Resources: Family Support Financial Screen Referred: No Living Expenses: Lives with family Money Management: Patient, Spouse Does the patient have any problems obtaining your medications?: No Home Management: spouse assisted Patient/Family Preliminary Plans: spouse able to cont. Care Coordinator Barriers to Discharge: Lack of/limited family support, Wound Care Care Coordinator Anticipated Follow Up Needs: [place] Additional Notes/Comments: lives alone, spouse able to assit if needed at d/c Expected length of stay: 14-16 Days  Clinical Impression Patient readmit. Previous SW, [place] D. Sw met with patient introduced self, explained role and addressed questions and concerns.  Dyanne Iha 04/02/2021, 10:14 AM

## 2021-04-03 LAB — GLUCOSE, CAPILLARY
Glucose-Capillary: 92 mg/dL (ref 70–99)
Glucose-Capillary: 132 mg/dL — ABNORMAL HIGH (ref 70–99)
Glucose-Capillary: 121 mg/dL — ABNORMAL HIGH (ref 70–99)
Glucose-Capillary: 114 mg/dL — ABNORMAL HIGH (ref 70–99)

## 2021-04-03 MED ORDER — BACLOFEN 5 MG HALF TABLET
5.0000 mg | ORAL_TABLET | Freq: Every day | ORAL | Status: DC
  Administered 2021-04-03 – 2021-04-05 (×3): 5 mg via ORAL
  Filled 2021-04-03 (×3): qty 1

## 2021-04-03 MED ORDER — METFORMIN HCL 500 MG PO TABS
1000.0000 mg | ORAL_TABLET | Freq: Two times a day (BID) | ORAL | Status: DC
  Administered 2021-04-03 – 2021-04-09 (×12): 1000 mg via ORAL
  Filled 2021-04-03 (×12): qty 2

## 2021-04-03 NOTE — Progress Notes (Signed)
Occupational Therapy Session Note  Patient Details  Name: Adam Perez MRN: [id number] Date of Birth: 1955/10/14  Today's Date: 04/03/2021 OT Individual Time: 1700-1749 OT Individual Time Calculation (min): 54 min    Short Term Goals: Week 1:  OT Short Term Goal 1 (Week 1): Pt will complete LB bathing sit to stand with min guard assist for two consecutive sessions. OT Short Term Goal 2 (Week 1): Pt will complete LB dressing with min guard sit to stand including donning limb guard on the LLE. OT Short Term Goal 3 (Week 1): Pt will complete toilet transfer at min guard assist to the 3:1 with use of the RW for support.  Skilled Therapeutic Interventions/Progress Updates:  Skilled OT intervention completed with focus on limb care education, mirror therapy and phantom limb education, functional transfers. Pt received seated in w/c, agreeable to session. Pt reporting increased spasms in L knee and mod phantom pain in LLE with pt reporting he made the MD aware this AM. Pt sit > stand using RW with CGA, then stand pivot with CGA to EOB, completed bed mobility to long sitting with supervision. Pt able to doff limb guard with cues for technique. Provided education on mirror therapy technique for pain management and desensitization with pt using long mirror with cues needed to attend to the reflective picture of the leg vs residual limb, with therapist covering residual limb with towel for enhanced experience. Pt reporting improvement in "ankle pain on the L" with pt also reporting it's kind of an eye opener to see his two legs again. Ace wrap on residual limb noted to be falling off, with therapist re-wrapping using figure 8 technique and educating pt on technique and purpose for independence with limb care. Issued pt an amputation booklet to promote increased knowledge with self-inspection and warning signs, and to prevent further surgical intervention due to pt's history with poor limb care. Pt able to  teach back 3 warning signs, including foul odor, color of discharge and discoloration. Pt encouraged to lay semi-supine in bed for passive stretch in L knee and hip to prevent further contraction with pt agreeable. Pt left semi-supine in bed, with bed alarm on and all needs in reach at end of session.   Therapy Documentation Precautions:  Precautions Precautions: Fall Precaution Comments: LLE NWB Required Braces or Orthoses: Other Brace Other Brace: limb protector Restrictions Weight Bearing Restrictions: Yes LLE Weight Bearing: Non weight bearing LLE Partial Weight Bearing Percentage or Pounds: \  Pain: Unrated phantom pain- completed mirror therapy with pt reporting improvement   Therapy/Group: Individual Therapy  Shakesha Soltau E Lynzie Cliburn 04/03/2021, 7:38 AM

## 2021-04-03 NOTE — Progress Notes (Signed)
PROGRESS NOTE   Subjective/Complaints: Complains of spasms in his left knee like used to be present prior to his amputation post-stroke.  He has no other complaints.  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    ROS: pain well controlled, denies constipation, last BM 1/22.   Objective:   No results found. Recent Labs    04/02/21 0640  WBC 8.6  HGB 8.9*  HCT 26.0*  PLT 359   Recent Labs    04/02/21 0640  NA 138  K 4.0  CL 105  CO2 24  GLUCOSE 101*  BUN 15  CREATININE 1.00  CALCIUM 9.0    Intake/Output Summary (Last 24 hours) at 04/03/2021 1015 Last data filed at 04/03/2021 0745 Gross per 24 hour  Intake 236 ml  Output 1175 ml  Net -939 ml        Physical Exam: Vital Signs Blood pressure 109/71, pulse 85, temperature 98.2 F (36.8 C), resp. rate 15, height 5\' 7"  (1.702 m), weight 91.1 kg, SpO2 98 %. Gen: no distress, normal appearing HEENT: oral mucosa pink and moist, NCAT Cardio: Reg rate Chest: normal effort, normal rate of breathing Abd: soft, non-distended Ext: no edema Psych: pleasant, normal affect Skin:    Comments: Left BKA site with limb guard protector as well as wound VAC.  Neurological:     Comments: Patient is alert and a bit anxious.  Makes eye contact with examiner.  He does recall his latest CIR admission.  Oriented x3.   Assessment/Plan: 1. Functional deficits which require 3+ hours per day of interdisciplinary therapy in a comprehensive inpatient rehab setting. Physiatrist is providing close team supervision and 24 hour management of active medical problems listed below. Physiatrist and rehab team continue to assess barriers to discharge/monitor patient progress toward functional and medical goals  Care Tool:  Bathing    Body parts bathed by patient: Right arm, Left arm, Chest, Abdomen, Right upper leg, Left upper leg, Face, Right lower leg     Body parts n/a: Left lower leg  (amputation)   Bathing assist Assist Level: Set up assist (seated only at sink)     Upper Body Dressing/Undressing Upper body dressing   What is the patient wearing?: Pull over shirt    Upper body assist Assist Level: Independent with assistive device    Lower Body Dressing/Undressing Lower body dressing      What is the patient wearing?: Pants     Lower body assist Assist for lower body dressing: Minimal Assistance - Patient > 75%     Toileting Toileting    Toileting assist Assist for toileting: Minimal Assistance - Patient > 75% Assistive Device Comment:  (urinal)   Transfers Chair/bed transfer  Transfers assist     Chair/bed transfer assist level: Minimal Assistance - Patient > 75% Chair/bed transfer assistive device:   Ambulation assist   Ambulation activity did not occur: Safety/medical concerns (pain, weakness)          Walk 10 feet activity   Assist  Walk 10 feet activity did not occur: Safety/medical concerns (pain, weakness)        Walk 50 feet activity  Assist Walk 50 feet with 2 turns activity did not occur: Safety/medical concerns (pain, weakness)         Walk 150 feet activity   Assist Walk 150 feet activity did not occur: Safety/medical concerns (pain, weakness)         Walk 10 feet on uneven surface  activity   Assist Walk 10 feet on uneven surfaces activity did not occur: Safety/medical concerns (pain, weakness)         Wheelchair     Assist Is the patient using a wheelchair?: Yes Type of Wheelchair: Manual    Wheelchair assist level: Supervision/Verbal cueing Max wheelchair distance: 112ft    Wheelchair 50 feet with 2 turns activity    Assist        Assist Level: Supervision/Verbal cueing   Wheelchair 150 feet activity     Assist      Assist Level: Supervision/Verbal cueing   Blood pressure 109/71, pulse 85, temperature 98.2 F (36.8 C), resp. rate  15, height 5\' 7"  (1.702 m), weight 91.1 kg, SpO2 98 %.  Medical Problem List and Plan: 1. 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              -ELOS/Goals: 10-14 days S            Continue CIR  Add Ensure BID as per patient's request for wound healing 2.  Impaired mobility: continue Heparin             -antiplatelet therapy: Aspirin 81 mg daily and Plavix 75 mg daily 3. Pain Management: Baclofen 10 mg 3 times daily as needed, hydrocodone as needed 4. Mood/PTSD: Provide emotional support             -antipsychotic agents: N/A 5. Neuropsych: This patient is capable of making decisions on his own behalf. 6. Skin/Wound Care: Routine skin checks 7. Fluids/Electrolytes/Nutrition: Routine in and outs with follow-up chemistries 8.  Acute on chronic anemia.  Hgb improving, discussed with patient.  9.  Hypertension.  Decrease Norvasc to 5 mg daily.  Monitor with increased mobility 10.  Diabetes mellitus with peripheral neuropathy.  Latest hemoglobin A1c 6.1.  Increase Glucophage 1,000 mg twice daily.  Discussed with patient. Check blood sugars before meals and at bedtime. Recommended avoiding foods with added sugars.  11.  Hyperlipidemia.  Continue Lipitor 12.  History of CVA with left-sided residual weakness.  Continue aspirin 13. Transaminitis: repeat CMP next week 14. Low protein: recommended choosing high protein foods.  15. LLE muscle spasms: add baclofen 5mg  HS 16. PTSD: discussed his traumatic experiences in the war.     LOS: 4 days A FACE TO FACE EVALUATION WAS PERFORMED  Kaitlan Bin P Meet [place] 04/03/2021, 10:15 AM

## 2021-04-03 NOTE — Progress Notes (Signed)
Physical Therapy Session Note  Patient Details  Name: Adam Perez MRN: [id number] Date of Birth: June 21, 1955  Today's Date: 04/03/2021 PT Individual Time: 1345-1456 PT Individual Time Calculation (min): 71 min   Short Term Goals: Week 1:  PT Short Term Goal 1 (Week 1): STG=LTG due to LOS  Skilled Therapeutic Interventions/Progress Updates:   Received pt sitting in WC, pt agreeable to PT treatment, and reported pain 3/10 in L residual limb. Pt extremely hyperverbal today requiring significantly increased time with mobility and cues for attention/re-direction. Session with emphasis on functional mobility/transfers, generalized strengthening, dynamic standing balance/coordination, amputee education, simulated car transfers, and improved activity tolerance. Pt performed WC mobility >325ft x 2 trials using RUE/RLE to/from ortho gym with a few rest breaks due to fatigue with emphasis on cardiovascular endurance. Pt performed simulated car transfer with RW and min A - cues for RW safety and turning technique. In dayroom, pt performed RLE strengthening on Kinetron at 20 cm/sec for 1 minute x 4 trials with therapist providing manual counter resistance with emphasis on glute/quad strength. Sit<>stand with RW and CGA and performed 3x10 L hip flexion and 3x10 L hip abduction with BUE support and CGA for balance. Pt then performed the following exercises sitting in [facility] with supervision and verbal cues for technique with emphasis on LE strength/ROM:  -hip abduction with yellow TB 3x10 -LAQ 2x15 with 2lb ankle weight on RLE -hip flexion 2x15 with 2lb ankle weight on RLE Pt performed WC mobility 48ft using RUE/LE back to room. Concluded session with pt sitting in WC, needs within reach, and chair pad alarm on.   Therapy Documentation Precautions:  Precautions Precautions: Fall Precaution Comments: LLE NWB Required Braces or Orthoses: Other Brace Other Brace: limb protector Restrictions Weight Bearing  Restrictions: Yes LLE Weight Bearing: Non weight bearing LLE Partial Weight Bearing Percentage or Pounds: \  Therapy/Group: Individual Therapy Martin Majestic PT, DPT   04/03/2021, 7:38 AM

## 2021-04-03 NOTE — Progress Notes (Signed)
Occupational Therapy Session Note  Patient Details  Name: Adam Perez MRN: [id number] Date of Birth: 22-Jun-1955  Today's Date: 04/03/2021 OT Individual Time: 1100-1200 OT Individual Time Calculation (min): 60 min    Short Term Goals: Week 1:  OT Short Term Goal 1 (Week 1): Pt will complete LB bathing sit to stand with min guard assist for two consecutive sessions. OT Short Term Goal 2 (Week 1): Pt will complete LB dressing with min guard sit to stand including donning limb guard on the LLE. OT Short Term Goal 3 (Week 1): Pt will complete toilet transfer at min guard assist to the 3:1 with use of the RW for support.  Skilled Therapeutic Interventions/Progress Updates:  Pt greeted supine in bed  agreeable to OT intervention. Session focus on BADL reeducation, functional mobility,and decreasing overall caregiver burden. Pt completed supine>sit with CGA. Pt needed MAX A to don limb guard with education provided on how to don. Pt completed toileting with urinal MOD I from EOB ( recorded in flowsheets).Pt completed sit<>stand from EOB with rw and CGA and CGA for stand pivot from EOB>w/c. Pt completed w/c propulsion from EOB to sink for ADLs MOD I.  Pt completed UB bathing and dressing from sink with overall supervision- set- up assist. Pt reports need to void bowels urgently. MIN A for stand pivot from w/c>BSC with Rw, +BM ( in flowsheet) pt did complete lateral leans for posterior pericare but needed MAX A in standing for cleanliness.  Pt c/o pain in R shoulder provided heat packs secured to RUE with pt completing light w/c propulsion around unit to provided gentle ROM to RUE. Pt returned to room with supervision where  pt left up in w/c with all needs within reach and chair alarm activated.                        Therapy Documentation Precautions:  Precautions Precautions: Fall Precaution Comments: LLE NWB Required Braces or Orthoses: Other Brace Other Brace: limb  protector Restrictions Weight Bearing Restrictions: Yes LLE Weight Bearing: Non weight bearing LLE Partial Weight Bearing Percentage or Pounds: \      Therapy/Group: Individual Therapy  Barron Schmid 04/03/2021, 12:18 PM

## 2021-04-04 DIAGNOSIS — Z89512 Acquired absence of left leg below knee: Secondary | ICD-10-CM | POA: Diagnosis not present

## 2021-04-04 LAB — GLUCOSE, CAPILLARY
Glucose-Capillary: 106 mg/dL — ABNORMAL HIGH (ref 70–99)
Glucose-Capillary: 121 mg/dL — ABNORMAL HIGH (ref 70–99)
Glucose-Capillary: 121 mg/dL — ABNORMAL HIGH (ref 70–99)
Glucose-Capillary: 122 mg/dL — ABNORMAL HIGH (ref 70–99)

## 2021-04-04 NOTE — Progress Notes (Signed)
Occupational Therapy Session Note  Patient Details  Name: Adam Perez MRN: [id number] Date of Birth: [birth year]  Today's Date: 04/04/2021 OT Individual Time: 6834-1962 OT Individual Time Calculation (min): 53 min    Short Term Goals: Week 1:  OT Short Term Goal 1 (Week 1): Pt will complete LB bathing sit to stand with min guard assist for two consecutive sessions. OT Short Term Goal 2 (Week 1): Pt will complete LB dressing with min guard sit to stand including donning limb guard on the LLE. OT Short Term Goal 3 (Week 1): Pt will complete toilet transfer at min guard assist to the 3:1 with use of the RW for support.  Skilled Therapeutic Interventions/Progress Updates:  Skilled OT intervention completed with focus on self-care, functional transfers, dynamic balance . Pt received seated in w/c agreeable to session. Pt found with small heat pack applied to R trap, with pt interested in dry needling technique, after primary therapist discussed with previous PTA that pt was having chronic consistent pain. Therapist messaged scheduling to arrange for certified therapist to see pt for future treatment. Pt requesting to complete self-care, with pt able to complete grooming with mod I and UB bathe/dress with supervision seated at sink, LB bathing with supervision, min A for LB dressing and balance in stance using RW. Pt requiring cues to donn L side before R, as well as technique for preventing pants falling to ankles when standing. Pt requesting to use toilet, with cues needed for positioning w/c in better placement for stand pivot transfer due to safety. Pt's friend entered room, and while present, pt's concentration and command following where impacted with pt resistant to therapist staying in room during toileting. Education provided on the requirement of at least close but modest supervision while on commode, with therapist standing behind door with frequent checking in for safety concern. Pt continent  of BM. Pt sit > stand with CGA using RW, then able to complete pericare with CGA for balance only, however therapist ensuring cleanliness but was able to donn LB clothing with min A. Therapist encouraged pt to take seated rest break, however pt partially moving against therapist recommendations and during stand pivot required total A to pivot into w/c to prevent fall as pt's R leg "gave out from being tired" however pt never made contact with anything but w/c seat.Therapist educated pt on the need for concentration despite visitors, slowing movements down, and safety considerations as pt increasingly impulsive and defiant of commands this session. Transported pt in w/c with total A to 53M6 (per nursing pt is being transferred to different room) with 4W and 53M nurses aware of pt's location. Pt left seated in w/c with chair alarm on, all needs in reach and pt's friend in room at end of session.   Therapy Documentation Precautions:  Precautions Precautions: Fall Precaution Comments: LLE NWB Required Braces or Orthoses: Other Brace Other Brace: limb protector Restrictions Weight Bearing Restrictions: Yes LLE Weight Bearing: Non weight bearing LLE Partial Weight Bearing Percentage or Pounds: \  Pain: 2/10 pain in L trap, heat already applied, declined further intervention   Therapy/Group: Individual Therapy  [name] Chain 04/04/2021, 7:54 AM

## 2021-04-04 NOTE — Patient Care Conference (Cosign Needed)
Inpatient RehabilitationTeam Conference and Plan of Care Update Date: 04/04/2021   Time: 12:10 PM    Patient Name: Adam Perez      Medical Record Number: [id number]  Date of Birth: 1955-10-05 Sex: Male         Room/Bed: 4W12C/4W12C-02 Payor Info: Payor: MEDICARE / Plan: MEDICARE PART A AND B / Product Type: *No Product type* /    Admit Date/Time:  03/30/2021  3:13 PM  Primary Diagnosis:  Left below-knee amputee [facility])  Hospital Problems: Principal Problem:   Left below-knee amputee [facility])    Expected Discharge Date: Expected Discharge Date: 04/09/21  Team Members Present: Physician leading conference: Dr. Sula Soda Social Worker Present: Lavera Guise, BSW Nurse Present: Other (comment) Regino Schultze) PT Present: Raechel Chute, PT OT Present: Other (comment) [facility] Cheyenne Adas) SLP Present: Colin Benton, SLP PPS Coordinator present : Fae Pippin, SLP     Current Status/Progress Goal Weekly Team Focus  Bowel/Bladder   Pt. is continent of B/B  Remain continent of B/B  Assess qshift and PRN   Swallow/Nutrition/ Hydration             ADL's   supervision for seated bathing at sink, min A LB dressing, min A toileting, CGA-min A stand pivot using RW  supervision  BUE strengthening, dynamic balance, donning of limb guard independence   Mobility   bed mobility supervision, transfers with RW CGA/min A, currently unsafe to ambulate  supervision/mod I  functional mobility/transfers, safety awareness, generalized strengthening, dynamic standing balance/coordination, D/C planning   Communication             Safety/Cognition/ Behavioral Observations            Pain   Pt. has intermittent pain rated 6/10 on numeric pain scale  Pt reports goal is <3  Assess pain qshift and PRN   Skin   Pt. has ace wrap around BKA.  Promote healing  Assess qshift and PRN, wound care M/W/F.     Discharge Planning:  discharging home with ex-wife (retired last week), able to move in.  24/7 supervision   Team Discussion: Plans to d/c home, ex-wife to provide 24/7 supervision as needed. Lacking awareness to deficits. Skin care education provided and patient able to verbalize understanding through teach back. Patient on target to meet rehab goals: yes  *See Care Plan and progress notes for long and short-term goals.   Revisions to Treatment Plan:  D/C goals for ambulation, wheelchair level  Teaching Needs: Continue skin care education, diabetes management, medication, etc  Current Barriers to Discharge: Decreased caregiver support  Possible Resolutions to Barriers: Ex wife agree to assist     Medical Summary Current Status: phantom limb spasms, decrease awareness of deficits, left sided hemiparesis, obesity, hypertension, PTSD, uncontrolled type 2 diabetes melitus  Barriers to Discharge: Medical stability  Barriers to Discharge Comments: phantom limb spasms, decrease awareness of deficits, left sided hemiparesis, obesity, hypertension, PTSD, uncontrolled type 2 diabetes melitus Possible Resolutions to [employer]: add additional baclofen for him at night, decrease norvasc and monitor blood pressure TID, discussed his PTSD, increase metformin to 1000mg  BID   Continued Need for Acute Rehabilitation Level of Care: The patient requires daily medical management by a physician with specialized training in physical medicine and rehabilitation for the following reasons: Direction of a multidisciplinary physical rehabilitation program to maximize functional independence : Yes Medical management of patient stability for increased activity during participation in an intensive rehabilitation regime.: Yes Analysis of laboratory values and/or  radiology reports with any subsequent need for medication adjustment and/or medical intervention. : Yes   I attest that I was present, lead the team conference, and concur with the assessment and plan of the team.   Bluford Kaufmann 04/04/2021, 12:10 PM

## 2021-04-04 NOTE — Progress Notes (Signed)
Physical Therapy Session Note  Patient Details  Name: Adam Perez MRN: [id number] Date of Birth: 16-Jan-1956  Today's Date: 04/04/2021 PT Individual Time: 0932-3557 and 3220-2542  PT Individual Time Calculation (min): 68 min and 54 min  Short Term Goals: Week 1:  PT Short Term Goal 1 (Week 1): STG=LTG due to LOS  Skilled Therapeutic Interventions/Progress Updates: pt presented in w/c agreeable to therapy. Pt states pain "manageable" and no intervention requested. Rest breaks provided as needed as pt c/o pain in L hip with therex. PTA donned limb guard total A for time management. Pt propelled to rehab gym with supervision via hemi technique. Performed stand pivot transfer to high/low mat with CGA. Pt participated in residual limb therex all performed to fatigue ~x25 with rest breaks provided as needed. Pt was supervision EOM to supine and performe SLR, hip abd/add, and quad sets with pillow under residual limb for increased knee extension. Pt then rolled to sidelying and performed hip abd/add to fatigue x~15. PTA then performed hip extension with ART for hip flexor stretch. Pt also performed ~x10 active hip extension with PTA blocking low back to stabilize and prevent rolling. Pt was able to get to prone and tolerate passive hip flexor stretch for approx 5 min. With increased time pt was able to return to sitting EOB via sidelying then supine. Pt then performed Sit to stand x 5 for RLE strengthening, Pt c/o increased R shoulder pain while pushing up to stand. Pt was able to tolerate stand pivot back to w/c however requesting to be transported back to room due to pain. Once back in room PTA provided pt with hot pack and placed with sling support. Pt left in w/c at end of session with seat alarm on, call bell within reach and needs met.   Tx2: Pt presented in bed agreeable to therapy. Pt denies pain initially during session and states pain in R shoulder has improved from this am. Pt propelled to rehab  gym with supervision and hemi technique. Pt participated in standing in parallel bars performing Sit to stand with CGA and pt was able to perform hip flexion, hip abd/add, and hip extension x 15 each bout. Pt then participated in gait in parallel bars for 2 bouts of ~40f each. On first bout pt with significant decreased foot clearance, on second bout pt demonstrated improved technique and was able to perform several hops clearing ground. Pt then participated in seated LE therex with RLE for improved standing tolerance. Pt performed LAQ, hip flexion, hip abd/add 2 x 10 with 4lb cuff and hamstring pulls with green resistance band 2 x 10. Pt then propelled to day room and participated in Cybex Kinetron 40cm/sec x 2 min and 30cm/sec x 2 min. Pt with notable fatigue after second bout. Pt transported back to room at end of session and requesting to remain in w/c. Pt left in w/c with seat alarm on, call bell within reach and wife present with current needs met.      Therapy Documentation Precautions:  Precautions Precautions: Fall Precaution Comments: LLE NWB Required Braces or Orthoses: Other Brace Other Brace: limb protector Restrictions Weight Bearing Restrictions: Yes LLE Weight Bearing: Non weight bearing LLE Partial Weight Bearing Percentage or Pounds: \  Therapy/Group: Individual Therapy  Rinda Rollyson Dvon Jiles, PTA  04/04/2021, 1:38 PM

## 2021-04-04 NOTE — Progress Notes (Signed)
PROGRESS NOTE   Subjective/Complaints: Spasms are better today but still present.  Team conference held today Notes some right shoulder pain due to overuse in lifting his left upper extremity   ROS:  +right shoulder pain  Objective:   No results found. Recent Labs    04/02/21 0640  WBC 8.6  HGB 8.9*  HCT 26.0*  PLT 359   Recent Labs    04/02/21 0640  NA 138  K 4.0  CL 105  CO2 24  GLUCOSE 101*  BUN 15  CREATININE 1.00  CALCIUM 9.0    Intake/Output Summary (Last 24 hours) at 04/04/2021 1230 Last data filed at 04/04/2021 0734 Gross per 24 hour  Intake 240 ml  Output 900 ml  Net -660 ml        Physical Exam: Vital Signs Blood pressure 102/67, pulse 95, temperature 97.9 F (36.6 C), temperature source Oral, resp. rate (!) 9, height 5\' 7"  (1.702 m), weight 91.1 kg, SpO2 99 %. Gen: no distress, normal appearing HEENT: oral mucosa pink and moist, NCAT Cardio: Reg rate Chest: normal effort, normal rate of breathing Abd: soft, non-distended Ext: no edema Psych: pleasant, normal affect Skin:    Comments: Left BKA site with limb guard protector as well as wound VAC.  Neurological:     Comments: Patient is alert and a bit anxious.  Makes eye contact with examiner.  He does recall his latest CIR admission.  Oriented x3.  MSK:+ right upper back muscle tightness  Assessment/Plan: 1. Functional deficits which require 3+ hours per day of interdisciplinary therapy in a comprehensive inpatient rehab setting. Physiatrist is providing close team supervision and 24 hour management of active medical problems listed below. Physiatrist and rehab team continue to assess barriers to discharge/monitor patient progress toward functional and medical goals  Care Tool:  Bathing    Body parts bathed by patient: Right arm, Left arm, Chest, Abdomen     Body parts n/a: Left lower leg (amputation)   Bathing assist Assist  Level: Supervision/Verbal cueing (UB only)     Upper Body Dressing/Undressing Upper body dressing   What is the patient wearing?: Pull over shirt    Upper body assist Assist Level: Set up assist    Lower Body Dressing/Undressing Lower body dressing      What is the patient wearing?: Pants     Lower body assist Assist for lower body dressing: Minimal Assistance - Patient > 75%     Toileting Toileting    Toileting assist Assist for toileting: Maximal Assistance - Patient 25 - 49% Assistive Device Comment:  (urinal)   Transfers Chair/bed transfer  Transfers assist     Chair/bed transfer assist level: Contact Guard/Touching assist Chair/bed transfer assistive device:   Ambulation assist   Ambulation activity did not occur: Safety/medical concerns (pain, weakness)          Walk 10 feet activity   Assist  Walk 10 feet activity did not occur: Safety/medical concerns (pain, weakness)        Walk 50 feet activity   Assist Walk 50 feet with 2 turns activity did not occur: Safety/medical concerns (pain, weakness)  Walk 150 feet activity   Assist Walk 150 feet activity did not occur: Safety/medical concerns (pain, weakness)         Walk 10 feet on uneven surface  activity   Assist Walk 10 feet on uneven surfaces activity did not occur: Safety/medical concerns (pain, weakness)         Wheelchair     Assist Is the patient using a wheelchair?: Yes Type of Wheelchair: Manual    Wheelchair assist level: Supervision/Verbal cueing Max wheelchair distance: >365ft    Wheelchair 50 feet with 2 turns activity    Assist        Assist Level: Supervision/Verbal cueing   Wheelchair 150 feet activity     Assist      Assist Level: Supervision/Verbal cueing   Blood pressure 102/67, pulse 95, temperature 97.9 F (36.6 C), temperature source Oral, resp. rate (!) 9, height 5\' 7"  (1.702 m),  weight 91.1 kg, SpO2 99 %.  Medical Problem List and Plan: 1. 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              -ELOS/Goals: 10-14 days S            Continue CIR  Add Ensure BID as per patient's request for wound healing  HFU scheduled 07/24/21 at 1:40pm 2.  Impaired mobility: continue Heparin             -antiplatelet therapy: Aspirin 81 mg daily and Plavix 75 mg daily 3. Pain Management: Baclofen 10 mg 3 times daily as needed, hydrocodone as needed 4. Mood/PTSD: Provide emotional support             -antipsychotic agents: N/A 5. Neuropsych: This patient is capable of making decisions on his own behalf. 6. Skin/Wound Care: Routine skin checks 7. Fluids/Electrolytes/Nutrition: Routine in and outs with follow-up chemistries 8.  Acute on chronic anemia.  Hgb improving, discussed with patient.  9.  Hypertension.  Decrease Norvasc to 5 mg daily.  Monitor with increased mobility 10.  Diabetes mellitus with peripheral neuropathy.  Latest hemoglobin A1c 6.1.  Increase Glucophage 1,000 mg twice daily.  Discussed with patient. Check blood sugars before meals and at bedtime. Recommended avoiding foods with added sugars.  11.  Hyperlipidemia.  Continue Lipitor 12.  History of CVA with left-sided residual weakness.  Continue aspirin 13. Transaminitis: repeat CMP next week 14. Low protein: recommended choosing high protein foods.  15. LLE muscle spasms: add baclofen 5mg  HS 16. PTSD: discussed his traumatic experiences in the war.  17. Right shoulder pain: add kpad 18. Decreased awareness of deficits: will need 24/7 supervision at home.     LOS: 5 days A FACE TO FACE EVALUATION WAS PERFORMED  07/26/21 Adam Perez 04/04/2021, 12:30 PM

## 2021-04-05 LAB — GLUCOSE, CAPILLARY
Glucose-Capillary: 119 mg/dL — ABNORMAL HIGH (ref 70–99)
Glucose-Capillary: 113 mg/dL — ABNORMAL HIGH (ref 70–99)
Glucose-Capillary: 109 mg/dL — ABNORMAL HIGH (ref 70–99)

## 2021-04-05 NOTE — Progress Notes (Signed)
Physical Therapy Session Note  Patient Details  Name: Adam Perez MRN: [id number] Date of Birth: [birth year]  Today's Date: 04/05/2021 PT Individual Time: 1620-1730 PT Individual Time Calculation (min): 70 min   Short Term Goals: Week 1:  PT Short Term Goal 1 (Week 1): STG=LTG due to LOS   Skilled Therapeutic Interventions/Progress Updates:   Pt received sitting in WC and agreeable to PT. Pt performed WC mobility through hall with hemi technique 2 x 163f to and from gym. Cues for safety through door and [facility] the [facility].  [facility] and cues for improved AD position in transfer. Sit<>supine with supervision assist with cues for safety and position of the LLE.   PT instructed pt in supine and sidelying therex. SAQ x 10 , SLR x 10, bridge through thighs x 6, sidelying hip extension x 10 sustained hip extension in prone x 2 min. performed x 2 bouts with moderate cues for decreased speed and full ROM for all therex.   [facility] returned to room and [facility] in reach and all needs met.        Therapy Documentation Precautions:  Precautions Precautions: Fall Precaution Comments: LLE NWB Required Braces or Orthoses: Other Brace Other Brace: limb protector Restrictions Weight Bearing Restrictions: Yes LLE Weight Bearing: Non weight bearing LLE Partial Weight Bearing Percentage or Pounds: \    Pain: Pain Assessment Pain Score: 0-No pain    Therapy/Group: Individual Therapy  ALorie Phenix1/26/2023, 6:10 PM

## 2021-04-05 NOTE — Progress Notes (Signed)
Occupational Therapy Session Note  Patient Details  Name: Adam Perez MRN: [id number] Date of Birth: [birth year]  Today's Date: 04/05/2021 OT Individual Time: 6144-3246 OT Individual Time Calculation (min): 56 min    Short Term Goals: Week 1:  OT Short Term Goal 1 (Week 1): Pt will complete LB bathing sit to stand with min guard assist for two consecutive sessions. OT Short Term Goal 1 - Progress (Week 1): Met OT Short Term Goal 2 (Week 1): Pt will complete LB dressing with min guard sit to stand including donning limb guard on the LLE. OT Short Term Goal 2 - Progress (Week 1): Met OT Short Term Goal 3 (Week 1): Pt will complete toilet transfer at min guard assist to the 3:1 with use of the RW for support. OT Short Term Goal 3 - Progress (Week 1): Met OT Short Term Goal 4 (Week 1): Pt will complete standing grooming task with no more than min cues to adhere to LLE PWB precautions. Week 2:  OT Short Term Goal 1 (Week 2): STG = LTG due to ELOS   Skilled Therapeutic Interventions/Progress Updates:    Pt greeted at time of session sitting up in wheelchair agreeable to OT session, no pain. Pt completing ADL routine at sink level for bathing tasks, set up/Supervision for UB and Supervision/CGA for LB bathing as well for buttocks in standing. Cues for safety and compensatory techniques throughout. UB dress set up and LB dress shorts only with CGA, donning over hips in standing. Pt hyperverbal throughout session and therapist providing overpressure for residual limb while up in wheelchair with education on preventing knee contractures for future prosthetic. Re-wrapped ace wrap for residual limb as well before donning limb guard. Pt set up in wheelchair alarm on call bell in reach.    Therapy Documentation Precautions:  Precautions Precautions: Fall Precaution Comments: LLE NWB Required Braces or Orthoses: Other Brace Other Brace: limb protector Restrictions Weight Bearing Restrictions:  Yes LLE Weight Bearing: Non weight bearing LLE Partial Weight Bearing Percentage or Pounds: \     Therapy/Group: Individual Therapy  Viona Gilmore 04/05/2021, 7:17 AM

## 2021-04-05 NOTE — Progress Notes (Deleted)
PROGRESS NOTE   Subjective/Complaints: Asks why he has suffered a stroke and amputation when his blood pressure and CBGs have been good. Asks what he can do to stay healthy Notes that his spasms are still present, but less painful  ROS:  +right shoulder pain, phantom limb pain spasms  Objective:   No results found. No results for input(s): WBC, HGB, HCT, PLT in the last 72 hours.  No results for input(s): NA, K, CL, CO2, GLUCOSE, BUN, CREATININE, CALCIUM in the last 72 hours.   Intake/Output Summary (Last 24 hours) at 04/05/2021 1851 Last data filed at 04/05/2021 1542 Gross per 24 hour  Intake 480 ml  Output 1425 ml  Net -945 ml        Physical Exam: Vital Signs Blood pressure 99/71, pulse 96, temperature 98.4 F (36.9 C), temperature source Oral, resp. rate 18, height 5\' 7"  (1.702 m), weight 91.1 kg, SpO2 100 %. Gen: no distress, normal appearing, BMI 31.46 HEENT: oral mucosa pink and moist, NCAT Cardio: Reg rate Chest: normal effort, normal rate of breathing Abd: soft, non-distended Ext: no edema Psych: pleasant, normal affect Skin:    Comments: Left BKA site with limb guard protector as well as wound VAC.  Neurological:     Comments: Patient is alert and a bit anxious.  Makes eye contact with examiner.  He does recall his latest CIR admission.  Oriented x3.  MSK:+ right upper back muscle tightness  Assessment/Plan: 1. Functional deficits which require 3+ hours per day of interdisciplinary therapy in a comprehensive inpatient rehab setting. Physiatrist is providing close team supervision and 24 hour management of active medical problems listed below. Physiatrist and rehab team continue to assess barriers to discharge/monitor patient progress toward functional and medical goals  Care Tool:  Bathing    Body parts bathed by patient: Right arm, Left arm, Chest, Abdomen, Front perineal area, Buttocks, Right upper  leg, Left upper leg, Face, Right lower leg     Body parts n/a: Left lower leg   Bathing assist Assist Level: Supervision/Verbal cueing     Upper Body Dressing/Undressing Upper body dressing   What is the patient wearing?: Pull over shirt    Upper body assist Assist Level: Set up assist    Lower Body Dressing/Undressing Lower body dressing      What is the patient wearing?: Pants     Lower body assist Assist for lower body dressing: Minimal Assistance - Patient > 75%     Toileting Toileting    Toileting assist Assist for toileting: Contact Guard/Touching assist Assistive Device Comment:  (urinal)   Transfers Chair/bed transfer  Transfers assist     Chair/bed transfer assist level: Contact Guard/Touching assist Chair/bed transfer assistive device: Walker, Armrests   Locomotion Ambulation   Ambulation assist   Ambulation activity did not occur: Safety/medical concerns (pain, weakness)  Assist level: Minimal Assistance - Patient > 75% Assistive device: Other (comment) (// bars) Max distance: 43ft   Walk 10 feet activity   Assist  Walk 10 feet activity did not occur: Safety/medical concerns (pain, weakness)        Walk 50 feet activity   Assist Walk 50 feet with  2 turns activity did not occur: Safety/medical concerns (pain, weakness)         Walk 150 feet activity   Assist Walk 150 feet activity did not occur: Safety/medical concerns (pain, weakness)         Walk 10 feet on uneven surface  activity   Assist Walk 10 feet on uneven surfaces activity did not occur: Safety/medical concerns (pain, weakness)         Wheelchair     Assist Is the patient using a wheelchair?: Yes Type of Wheelchair: Manual    Wheelchair assist level: Supervision/Verbal cueing Max wheelchair distance: >334ft    Wheelchair 50 feet with 2 turns activity    Assist        Assist Level: Supervision/Verbal cueing   Wheelchair 150 feet activity      Assist      Assist Level: Supervision/Verbal cueing   Blood pressure 99/71, pulse 96, temperature 98.4 F (36.9 C), temperature source Oral, resp. rate 18, height 5\' 7"  (1.702 m), weight 91.1 kg, SpO2 100 %.  Medical Problem List and Plan: 1. 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              -ELOS/Goals: 10-14 days S           Continue CIR  Add Ensure BID as per patient's request for wound healing  HFU scheduled 07/24/21 at 1:40pm 2.  Impaired mobility: continue Heparin             -antiplatelet therapy: Aspirin 81 mg daily and Plavix 75 mg daily 3. Pain: continue Baclofen 10 mg 3 times daily as needed, hydrocodone as needed 4. Mood/PTSD: Provide emotional support             -antipsychotic agents: N/A 5. Neuropsych: This patient is capable of making decisions on his own behalf. 6. Skin/Wound Care: Routine skin checks 7. Fluids/Electrolytes/Nutrition: Routine in and outs with follow-up chemistries 8.  Acute on chronic anemia.  Hgb improving, discussed with patient.  9.  Hypertension.  Decrease Norvasc to 5 mg daily.  Monitor with increased mobility 10.  Diabetes mellitus with peripheral neuropathy.  Latest hemoglobin A1c 6.1.  Increase Glucophage 1,000 mg twice daily.  Discussed with patient. Check blood sugars before meals and at bedtime. Recommended avoiding foods with added sugars. Discussed foods that are beneficial for him to eat.  11.  Hyperlipidemia.  Continue Lipitor 12.  History of CVA with left-sided residual weakness.  Continue aspirin 13. Transaminitis: repeat CMP next week 14. Low protein: recommended choosing high protein foods. Discussed healthy sources such as nuts and organic meats and eggs.  15. LLE muscle spasms: add baclofen 5mg  HS 16. PTSD: discussed his traumatic experiences in the war.  17. Right shoulder pain: add kpad 18. Decreased  awareness of deficits: will need 24/7 supervision at home.     LOS: 6 days A FACE TO FACE EVALUATION WAS PERFORMED  07/26/21 04/05/2021, 6:51 PM

## 2021-04-05 NOTE — Progress Notes (Signed)
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   ROS:  +right shoulder pain, phantom limb pain  Objective:   No results found. No results for input(s): WBC, HGB, HCT, PLT in the last 72 hours.  No results for input(s): NA, K, CL, CO2, GLUCOSE, BUN, CREATININE, CALCIUM in the last 72 hours.   Intake/Output Summary (Last 24 hours) at 04/05/2021 1204 Last data filed at 04/05/2021 0900 Gross per 24 hour  Intake 480 ml  Output 1625 ml  Net -1145 ml        Physical Exam: Vital Signs Blood pressure 104/78, pulse 92, temperature (!) 97.5 F (36.4 C), temperature source Oral, resp. rate 18, height 5\' 7"  (1.702 m), weight 91.1 kg, SpO2 99 %. Gen: no distress, normal appearing HEENT: oral mucosa pink and moist, NCAT Cardio: Reg rate Chest: normal effort, normal rate of breathing Abd: soft, non-distended Ext: no edema Psych: pleasant, normal affect Skin:    Comments: Left BKA site with limb guard protector as well as wound VAC.  Neurological:     Comments: Patient is alert and a bit anxious.  Makes eye contact with examiner.  He does recall his latest CIR admission.  Oriented x3.  MSK:+ right upper back muscle tightness  Assessment/Plan: 1. Functional deficits which require 3+ hours per day of interdisciplinary therapy in a comprehensive inpatient rehab setting. Physiatrist is providing close team supervision and 24 hour management of active medical problems listed below. Physiatrist and rehab team continue to assess barriers to discharge/monitor patient progress toward functional and medical goals  Care Tool:  Bathing    Body parts bathed by patient: Right arm, Left arm, Chest, Abdomen, Front perineal area, Buttocks, Right upper leg, Left upper leg, Face, Right lower leg     Body parts n/a: Left lower leg   Bathing  assist Assist Level: Supervision/Verbal cueing     Upper Body Dressing/Undressing Upper body dressing   What is the patient wearing?: Pull over shirt    Upper body assist Assist Level: Set up assist    Lower Body Dressing/Undressing Lower body dressing      What is the patient wearing?: Pants     Lower body assist Assist for lower body dressing: Minimal Assistance - Patient > 75%     Toileting Toileting    Toileting assist Assist for toileting: Contact Guard/Touching assist Assistive Device Comment:  (urinal)   Transfers Chair/bed transfer  Transfers assist     Chair/bed transfer assist level: Contact Guard/Touching assist Chair/bed transfer assistive device: Walker, Armrests   Locomotion Ambulation   Ambulation assist   Ambulation activity did not occur: Safety/medical concerns (pain, weakness)  Assist level: Minimal Assistance - Patient > 75% Assistive device: Other (comment) (// bars) Max distance: 26ft   Walk 10 feet activity   Assist  Walk 10 feet activity did not occur: Safety/medical concerns (pain, weakness)        Walk 50 feet activity   Assist Walk 50 feet with 2 turns activity did not occur: Safety/medical concerns (pain, weakness)  Walk 150 feet activity   Assist Walk 150 feet activity did not occur: Safety/medical concerns (pain, weakness)         Walk 10 feet on uneven surface  activity   Assist Walk 10 feet on uneven surfaces activity did not occur: Safety/medical concerns (pain, weakness)         Wheelchair     Assist Is the patient using a wheelchair?: Yes Type of Wheelchair: Manual    Wheelchair assist level: Supervision/Verbal cueing Max wheelchair distance: >372ft    Wheelchair 50 feet with 2 turns activity    Assist        Assist Level: Supervision/Verbal cueing   Wheelchair 150 feet activity     Assist      Assist Level: Supervision/Verbal cueing   Blood pressure 104/78,  pulse 92, temperature (!) 97.5 F (36.4 C), temperature source Oral, resp. rate 18, height 5\' 7"  (1.702 m), weight 91.1 kg, SpO2 99 %.  Medical Problem List and Plan: 1. 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              -ELOS/Goals: 10-14 days S           Continue CIR  Add Ensure BID as per patient's request for wound healing  HFU scheduled 07/24/21 at 1:40pm 2.  Impaired mobility: continue Heparin             -antiplatelet therapy: Aspirin 81 mg daily and Plavix 75 mg daily 3. Pain: continue Baclofen 10 mg 3 times daily as needed, hydrocodone as needed 4. Mood/PTSD: Provide emotional support             -antipsychotic agents: N/A 5. Neuropsych: This patient is capable of making decisions on his own behalf. 6. Skin/Wound Care: Routine skin checks 7. Fluids/Electrolytes/Nutrition: Routine in and outs with follow-up chemistries 8.  Acute on chronic anemia.  Hgb improving, discussed with patient.  9.  Hypertension.  Decrease Norvasc to 5 mg daily.  Monitor with increased mobility 10.  Diabetes mellitus with peripheral neuropathy.  Latest hemoglobin A1c 6.1.  Increase Glucophage 1,000 mg twice daily.  Discussed with patient. Check blood sugars before meals and at bedtime. Recommended avoiding foods with added sugars. Discussed foods that are beneficial for him to eat.  11.  Hyperlipidemia.  Continue Lipitor 12.  History of CVA with left-sided residual weakness.  Continue aspirin 13. Transaminitis: repeat CMP next week 14. Low protein: recommended choosing high protein foods.  15. LLE muscle spasms: add baclofen 5mg  HS 16. PTSD: discussed his traumatic experiences in the war.  17. Right shoulder pain: add kpad 18. Decreased awareness of deficits: will need 24/7 supervision at home.     LOS: 6 days A FACE TO FACE EVALUATION WAS PERFORMED  07/26/21 Adam Perez 04/05/2021,  12:04 PM

## 2021-04-05 NOTE — Progress Notes (Signed)
Patient ID: Adam Perez, male   DOB: 1955-09-26, 66 y.o.   MRN: [id number]  Team Conference Report to Patient/Family  Team Conference discussion was reviewed with the patient and caregiver, including goals, any changes in plan of care and target discharge date.  Patient and caregiver express understanding and are in agreement.  The patient has a target discharge date of 04/09/21.  Sw met with patient and provided team conference updates. SW called pt ex-spouse to provide information and ensure she was agreeable, she is able to pick patient up this day.  Dyanne Iha 04/05/2021, 10:12 AM

## 2021-04-05 NOTE — Progress Notes (Signed)
Physical Therapy Session Note  Patient Details  Name: Adam Perez MRN: [id number] Date of Birth: 07-Feb-1956  Today's Date: 04/05/2021 PT Individual Time: 1000-1108 PT Individual Time Calculation (min): 68 min   Short Term Goals: Week 1:  PT Short Term Goal 1 (Week 1): STG=LTG due to LOS  Skilled Therapeutic Interventions/Progress Updates:   Received pt sitting in WC, pt agreeable to PT treatment, and reported mild phantom limb pain and R shoulder pain during session but did not state pain level and declined any pain interventions. Session with emphasis on functional mobility/transfers, generalized strengthening, dynamic standing balance/coordination, gait training, and improved activity tolerance. Pt performed WC mobility 185ft x 1, 91ft x 1, and >336ft x 1 using R hemi technique and supervision during session with emphasis on cardiovascular endurance. Sit<>stand inside // bars with CGA and worked on "hopping" in place on RLE 1x8 and 1x10 reps with CGA/min A for balance - cues for increased reliance on UE support to decrease repetitive force landing on R knee - pt with decreased foot clearance. Pt then ambulated 1ft forward and 89ft backwards, took seated rest break, then ambulated additional 38ft forwards and 21ft backwards with min A - cues for appropriate step length for safety. Stand<>pivot on/off Nustep with RW and CGA and performed seated BUE and RLE strengthening on Nustep at workload 3 for 10 minutes for a total of 836 steps with emphasis on cardiovascular endurance - extended handle bar to maximum length on L side due to decreased ROM and strength from CVA. Pt diaphoretic and required extensive rest/water break afterwards. Pt then performed the following exercises sitting in [facility] with supervision with emphasis on strength/ROM: -RUE bicep curls with 4lb dumbbell 1x15 increasing to 5lb dumbbell 1x15 -RUE hammer curls with 5lb dumbbell 2x15  -RLE LAQ with 5lb ankle weight 2x15 -RLE hip flexion  with 5lb ankle weight 2x15 Concluded session with pt sitting in WC, needs within reach, and chair pad alarm on.   Therapy Documentation Precautions:  Precautions Precautions: Fall Precaution Comments: LLE NWB Required Braces or Orthoses: Other Brace Other Brace: limb protector Restrictions Weight Bearing Restrictions: Yes LLE Weight Bearing: Non weight bearing LLE Partial Weight Bearing Percentage or Pounds: \  Therapy/Group: Individual Therapy Martin Majestic PT, DPT   04/05/2021, 7:29 AM

## 2021-04-06 LAB — GLUCOSE, CAPILLARY
Glucose-Capillary: 95 mg/dL (ref 70–99)
Glucose-Capillary: 116 mg/dL — ABNORMAL HIGH (ref 70–99)
Glucose-Capillary: 106 mg/dL — ABNORMAL HIGH (ref 70–99)
Glucose-Capillary: 120 mg/dL — ABNORMAL HIGH (ref 70–99)

## 2021-04-06 MED ORDER — BACLOFEN 5 MG HALF TABLET
5.0000 mg | ORAL_TABLET | Freq: Three times a day (TID) | ORAL | Status: DC
  Administered 2021-04-06 – 2021-04-09 (×9): 5 mg via ORAL
  Filled 2021-04-06 (×9): qty 1

## 2021-04-06 NOTE — Progress Notes (Signed)
°   04/06/21 1045  Clinical Encounter Type  Visited With Patient  Visit Type Initial;Spiritual support;Psychological support  Referral From Physician  Consult/Referral To Chaplain   Chaplain Tery Sanfilippo responded consult request. The patient sitting in the chair beside his bed. Donnajean Lopes engaged in active listening as the patient spoke of his adjustment to his amputation. Chaplain understands that the patient blames himself. 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 He appears to rationalize how he did not lose a limb during the war but had not been disciplined and he also blames the [employer] for the ending of his marriage. Chaplain provided offered prayer and words of encouragement. Chaplain continued in conversation until P.T. arrived.This note was prepared by Deneen Harts, M.Div..  For questions please contact by phone [phone].

## 2021-04-06 NOTE — Progress Notes (Signed)
Physical Therapy Discharge Summary  Patient Details  Name: Adam Perez MRN: [id number] Date of Birth: 26-May-1955  Patient has met 4 of 9 long term goals due to improved activity tolerance, improved balance, improved postural control, increased strength, increased range of motion, decreased pain, and improved awareness. Patient to discharge at a wheelchair level Supervision. Patient's care partner is independent to provide the necessary physical and cognitive assistance at discharge.  Reasons goals not met: Pt did not meet dynamic standing and transfer (including car transfer) goals of supervision as pt currently requires CGA due to L hemiparesis and decreased safety awareness. Pt also did not meet gait goal of 42f with min A as pt is only able to ambulate up to 558finside // bars with min A due to fatigue, weakness, L hemiparesis, and poor RLE foot clearance.   Recommendation:  Patient will benefit from ongoing skilled PT services in home health setting to continue to advance safe functional mobility, address ongoing impairments in transfers, generalized strengthening, dynamic standing balance/coordination, endurance, and to minimize fall risk.  Equipment: No equipment provided - already has all equipment from previous stay  Reasons for discharge: treatment goals met  Patient/family agrees with progress made and goals achieved: Yes  PT Discharge Precautions/Restrictions Precautions Precautions: Fall Precaution Comments: LLE NWB Required Braces or Orthoses: Other Brace Other Brace: limb protector Restrictions Weight Bearing Restrictions: Yes LLE Weight Bearing: Non weight bearing Pain Interference Pain Interference Pain Effect on Sleep: 1. Rarely or not at all Pain Interference with Therapy Activities: 1. Rarely or not at all Pain Interference with Day-to-Day Activities: 1. Rarely or not at all Cognition Overall Cognitive Status: History of cognitive impairments - at  baseline Arousal/Alertness: Awake/alert Orientation Level: Oriented X4 Memory: Appears intact Awareness: Impaired Problem Solving: Impaired Safety/Judgment: Impaired Comments: decreased insight into deficits Sensation Sensation Light Touch: Impaired Detail Hot/Cold: Appears Intact Proprioception: Impaired by gross assessment Stereognosis: Not tested Additional Comments: decreased sensation along incision on L residual limb Coordination Gross Motor Movements are Fluid and Coordinated: No Fine Motor Movements are Fluid and Coordinated: No Coordination and Movement Description: uncoordinated due to L hemiparesis from previous CVA, LUE flexor tone, generalized weakness/deconditoning, altered balance strategies due to BKA Finger Nose Finger Test: Decreased ROM on LUE due to previous CVA with L hemiparesis and due to LUE flexor tone Heel Shin Test: [facility] R[facility], decreased ROM and coordination on LLE due to BKA and hemiparesis Motor  Motor Motor: Hemiplegia;Abnormal postural alignment and control;Abnormal tone Motor - Skilled Clinical Observations: uncoordinated due to L hemiparesis from previous CVA, LUE flexor tone, generalized weakness/deconditoning, altered balance strategies due to BKA  Mobility Bed Mobility Bed Mobility: Rolling Right;Rolling Left;Supine to Sit;Sit to Supine Rolling Right: Independent with assistive device Rolling Left: Independent with assistive device Supine to Sit: Independent with assistive device Sit to Supine: Independent with assistive device Transfers Transfers: Sit to Stand;Stand to Sit;Stand Pivot Transfers Sit to Stand: Supervision/Verbal cueing Stand to Sit: Supervision/Verbal cueing Stand Pivot Transfers: Contact Guard/Touching assist Stand Pivot Transfer Details: Verbal cues for technique;Verbal cues for safe use of DME/AE Stand Pivot Transfer Details (indicate cue type and reason): occasional verbal cues for RW safety and turning technique Transfer  (Assistive device): Rolling walker Locomotion  Gait Ambulation: Yes Gait Assistance: Minimal Assistance - Patient > 75% (// bars) Gait Distance (Feet): 5 Feet Assistive device: Parallel bars Gait Assistance Details: Verbal cues for gait pattern;Verbal cues for sequencing;Verbal cues for technique;Verbal cues for precautions/safety Gait Assistance Details: verbal cues  for increased UE use to improve foot clearance and to land "softly" on RLE Gait Gait: Yes Gait Pattern: Impaired Gait Pattern: Step-to pattern;Decreased step length - right;Decreased stride length;Decreased dorsiflexion - right;Trunk flexed;Poor foot clearance - right Gait velocity: decreased Stairs / Additional Locomotion Stairs: No Wheelchair Mobility Wheelchair Mobility: Yes Wheelchair Assistance: Independent with Camera operator: Right upper extremity;Right lower extremity Wheelchair Parts Management: Independent Distance: >131f  Trunk/Postural Assessment  Cervical Assessment Cervical Assessment: [facility] to [facility] Thoracic Assessment Thoracic Assessment: Exceptions to [facility](kyphosis) Lumbar Assessment Lumbar Assessment: Exceptions to [facility](posterior pelvic tilt in sitting) Postural Control Postural Control: Deficits on evaluation Protective Responses: delayed  Balance Balance Balance Assessed: Yes Static Sitting Balance Static Sitting - Balance Support: Bilateral upper extremity supported;Feet supported Static Sitting - Level of Assistance: 6: Modified independent (Device/Increase time) Dynamic Sitting Balance Dynamic Sitting - Balance Support: Feet supported;No upper extremity supported Dynamic Sitting - Level of Assistance: 5: Stand by assistance (supervision) Static Standing Balance Static Standing - Balance Support: Bilateral upper extremity supported (RW) Static Standing - Level of Assistance: 5: Stand by assistance (close supervision) Dynamic Standing  Balance Dynamic Standing - Balance Support: Bilateral upper extremity supported (RW) Dynamic Standing - Level of Assistance: 5: Stand by assistance (CGA) Dynamic Standing - Comments: with stand<>pivot transfers Extremity Assessment  RLE Assessment RLE Assessment: Exceptions to [facility] Strength Comments: grossly generalized to 4+/5 LLE Assessment LLE Assessment: Exceptions to WSt [facility]General Strength Comments: grossly generalized to 4-/5 (hip flexion, hip abd/add, and knee flexion) - mild pain with testing  AAlfonse AlpersPT, DPT  04/06/2021, 7:35 AM

## 2021-04-06 NOTE — Progress Notes (Signed)
PROGRESS NOTE   Subjective/Complaints: Spasms becoming more painful in left phantom limb- discussed risks and benefits of increasing baclofen further and he is agreeable to this Provided with pain journal and dietary handouts.   ROS:  +right shoulder pain, +phantom limb pain, +PTSD  Objective:   No results found. No results for input(s): WBC, HGB, HCT, PLT in the last 72 hours.  No results for input(s): NA, K, CL, CO2, GLUCOSE, BUN, CREATININE, CALCIUM in the last 72 hours.   Intake/Output Summary (Last 24 hours) at 04/06/2021 1248 Last data filed at 04/06/2021 0900 Gross per 24 hour  Intake 480 ml  Output 300 ml  Net 180 ml        Physical Exam: Vital Signs Blood pressure 121/68, pulse (!) 103, temperature 98.6 F (37 C), temperature source Oral, resp. rate 18, height 5\' 7"  (1.702 m), weight 91.1 kg, SpO2 99 %. Gen: no distress, normal appearing HEENT: oral mucosa pink and moist, NCAT Cardio: Tachycardia Chest: normal effort, normal rate of breathing Abd: soft, non-distended Ext: no edema Psych: pleasant, normal affect Skin:    Comments: Left BKA site with limb guard protector as well as wound VAC.  Neurological:     Comments: Patient is alert and a bit anxious.  Makes eye contact with examiner.  He does recall his latest CIR admission.  Oriented x3.  MSK:+ right upper back muscle tightness  Assessment/Plan: 1. Functional deficits which require 3+ hours per day of interdisciplinary therapy in a comprehensive inpatient rehab setting. Physiatrist is providing close team supervision and 24 hour management of active medical problems listed below. Physiatrist and rehab team continue to assess barriers to discharge/monitor patient progress toward functional and medical goals  Care Tool:  Bathing    Body parts bathed by patient: Right arm, Left arm, Chest, Abdomen, Front perineal area, Buttocks, Right upper leg,  Left upper leg, Face, Right lower leg     Body parts n/a: Left lower leg   Bathing assist Assist Level: Supervision/Verbal cueing     Upper Body Dressing/Undressing Upper body dressing   What is the patient wearing?: Pull over shirt    Upper body assist Assist Level: Supervision/Verbal cueing    Lower Body Dressing/Undressing Lower body dressing      What is the patient wearing?: Pants     Lower body assist Assist for lower body dressing: Minimal Assistance - Patient > 75%     Toileting Toileting    Toileting assist Assist for toileting: Contact Guard/Touching assist Assistive Device Comment:  (urinal)   Transfers Chair/bed transfer  Transfers assist     Chair/bed transfer assist level: Contact Guard/Touching assist Chair/bed transfer assistive device: Armrests,   Ambulation assist   Ambulation activity did not occur: Safety/medical concerns (pain, weakness)  Assist level: Minimal Assistance - Patient > 75% Assistive device: Other (comment) (// bars) Max distance: 45ft   Walk 10 feet activity   Assist  Walk 10 feet activity did not occur: Safety/medical concerns (pain, weakness)        Walk 50 feet activity   Assist Walk 50 feet with 2 turns activity did not occur: Safety/medical concerns (pain,  weakness)         Walk 150 feet activity   Assist Walk 150 feet activity did not occur: Safety/medical concerns (pain, weakness)         Walk 10 feet on uneven surface  activity   Assist Walk 10 feet on uneven surfaces activity did not occur: Safety/medical concerns (pain, weakness)         Wheelchair     Assist Is the patient using a wheelchair?: Yes Type of Wheelchair: Manual    Wheelchair assist level: Supervision/Verbal cueing Max wheelchair distance: >358ft    Wheelchair 50 feet with 2 turns activity    Assist        Assist Level: Supervision/Verbal cueing   Wheelchair 150 feet  activity     Assist      Assist Level: Supervision/Verbal cueing   Blood pressure 121/68, pulse (!) 103, temperature 98.6 F (37 C), temperature source Oral, resp. rate 18, height 5\' 7"  (1.702 m), weight 91.1 kg, SpO2 99 %.  Medical Problem List and Plan: 1. 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              -ELOS/Goals: 10-14 days S             Continue CIR  Add Ensure BID as per patient's request for wound healing  HFU scheduled  2.  Impaired mobility: continue Heparin             -antiplatelet therapy: Aspirin 81 mg daily and Plavix 75 mg daily 3. Phantom limb spasms: increase baclofen to 5mg  TID, hydrocodone as needed 4. Mood/PTSD: Provide emotional support             -antipsychotic agents: N/A 5. Neuropsych: This patient is capable of making decisions on his own behalf. 6. Skin/Wound Care: Routine skin checks 7. Fluids/Electrolytes/Nutrition: Routine in and outs with follow-up chemistries 8.  Acute on chronic anemia.  Hgb improving, discussed with patient.  9.  Hypertension.  Decrease Norvasc to 5 mg daily.  Monitor with increased mobility 10.  Diabetes mellitus with peripheral neuropathy.  Latest hemoglobin A1c 6.1.  Increase Glucophage 1,000 mg twice daily.  Discussed with patient. Check blood sugars before meals and at bedtime. Recommended avoiding foods with added sugars. Discussed foods that are beneficial for him to eat and provided list of foods that are good for diabetes. 11.  Hyperlipidemia.  Continue Lipitor 12.  History of CVA with left-sided residual weakness.  Continue aspirin 13. Transaminitis: repeat CMP next week 14. Low protein: recommended choosing high protein foods.  15. Obesity: provided with list of foods that are good for weight loss.  16. PTSD: discussed his traumatic experiences in the war.  17. Right shoulder pain: add kpad.  Provided with pain relief journal.  18. Decreased awareness of deficits: will need 24/7 supervision at home.     LOS: 7 days A FACE TO FACE EVALUATION WAS PERFORMED  03/28/2021 04/06/2021, 12:48 PM

## 2021-04-06 NOTE — Progress Notes (Signed)
Physical Therapy Session Note  Patient Details  Name: Adam Perez MRN: [id number] Date of Birth: 24-Apr-1955  Today's Date: 04/06/2021 PT Individual Time: 0900-0957 PT Individual Time Calculation (min): 57 min   Short Term Goals: Week 1:  PT Short Term Goal 1 (Week 1): STG=LTG due to LOS  Skilled Therapeutic Interventions/Progress Updates:   Received pt sitting in WC, pt agreeable to PT treatment, and reported pain 3/10 in L residual limb. Session with emphasis on discharge planning, functional mobility/transfers, generalized strengthening, dynamic standing balance/coordination, simulated car transfers, and improved activity tolerance. Pt sat in [facility] and brushed teeth at sink with set up assist. Donned R shoe with set up assist and limb guard with max A and pt performed WC mobility 24ft using R hemi technique with supervision to ortho gym. Pt performed simulated car transfer with RW and CGA - pt able to recall technique/cues for safety without assist from therapist. Pt performed WC mobility additional 335ft using R hemi technique and supervision to dayroom and transferred on/off mat with RW and CGA. Sit<>stand with RW and CGA/close supervision and worked on Airline pilot with RUE and CGA/close supervision for balance x 4 trials. Performed standing L hip flexion 2x20 and L hip abduction 2x20 to fatigue - noted decreased ROM due to L hemiparesis. Pt performed WC mobility additional >346ft using R hemi technique and supervision back to room. Concluded session with pt sitting in WC, needs within reach, and chair pad alarm on.   Therapy Documentation Precautions:  Precautions Precautions: Fall Precaution Comments: LLE NWB Required Braces or Orthoses: Other Brace Other Brace: limb protector Restrictions Weight Bearing Restrictions: Yes LLE Weight Bearing: Non weight bearing LLE Partial Weight Bearing Percentage or Pounds: \  Therapy/Group: Individual  Therapy Martin Majestic PT, DPT   04/06/2021, 7:26 AM

## 2021-04-06 NOTE — Progress Notes (Signed)
Occupational Therapy Session Note  Patient Details  Name: Adam Perez MRN: [id number] Date of Birth: Dec 18, 1955  Today's Date: 04/06/2021 OT Individual Time: 1100-1155 OT Individual Time Calculation (min): 55 min    Short Term Goals: Week 2:  OT Short Term Goal 1 (Week 2): STG = LTG due to ELOS  Skilled Therapeutic Interventions/Progress Updates:  Pt greeted with chaplain present  finishing up conversation. Pt agreeable to OT intervention. Session focus on BADL reeducation, LB clothing mgmt during toileting dynamic standing balance and decreasing overall caregiver burden. Pt completed stand pivot transfer from w/c >drop arm BSC with RW and CGA. Pt able to doff pants for toileting with CGA while pt used RUE to doff pants.pt with small +BM, pt completed posterior pericare via anterior/lateral leans with CGA for safety, pt able to stand to pull pants up to waist line in standing with CGA. CGA for pivot back to w/c. Pt completed oral care and shaving at sink with supervision pt completed UB dressing with set- up assist, MIN A needed for LB dressing as pt able to don pants from w/c but needed MIN A to pull pants up to waist line.Pt mostly very chatty and not paying attention to task needing increased assist for LB dressing. Pt left up in w/c with chair alarm activated and all needs within reach and MD present talking to pt.                           Therapy Documentation Precautions:  Precautions Precautions: Fall Precaution Comments: LLE NWB Required Braces or Orthoses: Other Brace Other Brace: limb protector Restrictions Weight Bearing Restrictions: Yes LLE Weight Bearing: Non weight bearing LLE Partial Weight Bearing Percentage or Pounds: \   Pain: no pain reported during session     Therapy/Group: Individual Therapy  Pollyann Glen [facility] 04/06/2021, 12:05 PM

## 2021-04-06 NOTE — Progress Notes (Signed)
Occupational Therapy Discharge Summary  Patient Details  Name: Adam Perez MRN: [id number] Date of Birth: 1955/11/02   Patient has met 6 of 10 long term goals due to improved activity tolerance, improved balance, ability to compensate for deficits, and functional use of  LEFT upper extremity.  Patient to discharge at overall Supervision level.  Patient's care partner is independent to provide the necessary physical assistance at discharge.    Reasons goals not met: pt did not meet transfer, toileting and LB dressing goals due to pt's impulsivity and decreased recall of cues with pt requiring at least CGA vs supervision for safety concern. Pt's decreased balance/strength and safety awareness affected pt's ability to complete with just supervision assist.  Recommendation:  Patient will benefit from ongoing skilled OT services in home health setting to continue to advance functional skills in the area of BADL, iADL, and Reduce care partner burden.  Equipment: Pt already has TTB available from a friend he reports, no other DME needed  Reasons for discharge: treatment goals met and discharge from hospital  Patient/family agrees with progress made and goals achieved: Yes  OT Discharge Precautions/Restrictions  Precautions Precautions: Fall Precaution Comments: LLE NWB Required Braces or Orthoses: Other Brace Other Brace: limb protector Restrictions Weight Bearing Restrictions: Yes LLE Weight Bearing: Non weight bearing ADL ADL Eating: Independent Where Assessed-Eating: Wheelchair Grooming: Modified independent Where Assessed-Grooming: Wheelchair, Sitting at sink Upper Body Bathing: Modified independent Where Assessed-Upper Body Bathing: Sitting at sink, Wheelchair Lower Body Bathing: Supervision/safety Where Assessed-Lower Body Bathing: Sitting at sink, Standing at sink Upper Body Dressing: Setup Where Assessed-Upper Body Dressing: Sitting at sink Lower Body Dressing:  Supervision/safety Where Assessed-Lower Body Dressing: Sitting at sink, Standing at sink Toileting: Minimal assistance Where Assessed-Toileting: Bedside Commode Toilet Transfer: Other (comment) (CGA) Toilet Transfer Method: Stand pivot Science writer: Radiographer, therapeutic: Close supervison Clinical cytogeneticist Method: Sit pivot, Stand pivot Tub/Shower Equipment: Radio broadcast assistant, Energy manager: Not assessed Vision Baseline Vision/History: 1 Wears glasses Patient Visual Report: No change from baseline Vision Assessment?: No apparent visual deficits Perception  Perception: Within Functional Limits Praxis Praxis: Intact Cognition Overall Cognitive Status: History of cognitive impairments - at baseline Arousal/Alertness: Awake/alert Orientation Level: Oriented X4 Year: 2023 Month: January Day of Week: Correct Memory: Appears intact Immediate Memory Recall: Sock;Blue;Bed Memory Recall Sock: With Cue Memory Recall Blue: Without Cue Memory Recall Bed: Without Cue Awareness: Impaired Problem Solving: Impaired Safety/Judgment: Impaired Comments: decreased insight into deficits Sensation Sensation Light Touch: Appears Intact Hot/Cold: Appears Intact Proprioception: Not tested Stereognosis: Not tested Additional Comments: BUE light touch intact per report as well as hot cold. Coordination Gross Motor Movements are Fluid and Coordinated: No Fine Motor Movements are Fluid and Coordinated: No Coordination and Movement Description: Pt with hx of LUE hemiparesis secondary to CVA.  Synergy pattern noted with functional use at a diminshed level for bathing, dressing, and transfers with use of the RW fo support. Finger Nose Finger Test: prior CVA - dysmetria LUE Motor  Motor Motor: Hemiplegia;Abnormal postural alignment and control;Abnormal tone Motor - Skilled Clinical Observations: uncoordinated due to L hemiparesis from previous CVA,  LUE flexor tone, generalized weakness/deconditoning, altered balance strategies due to BKA Mobility  Bed Mobility Bed Mobility: Rolling Right;Rolling Left;Supine to Sit;Sit to Supine Rolling Right: Independent with assistive device Rolling Left: Independent with assistive device Supine to Sit: Independent with assistive device Sit to Supine: Independent with assistive device Transfers Sit to Stand: Supervision/Verbal cueing Stand to Sit: Supervision/Verbal  cueing  Trunk/Postural Assessment  Cervical Assessment Cervical Assessment: Exceptions to [facility] (forward head) Thoracic Assessment Thoracic Assessment: Exceptions to [facility] (kyphosis) Lumbar Assessment Lumbar Assessment: Exceptions to [facility] (posterior pelvic tilt in sitting) Postural Control Postural Control: Deficits on evaluation Protective Responses: delayed  Balance Balance Balance Assessed: Yes Static Sitting Balance Static Sitting - Balance Support: Bilateral upper extremity supported;Feet supported Static Sitting - Level of Assistance: 6: Modified independent (Device/Increase time) Dynamic Sitting Balance Dynamic Sitting - Balance Support: Feet supported;No upper extremity supported Dynamic Sitting - Level of Assistance: 5: Stand by assistance (supervision) Static Standing Balance Static Standing - Balance Support: Bilateral upper extremity supported (RW) Static Standing - Level of Assistance: 5: Stand by assistance (close supervision) Dynamic Standing Balance Dynamic Standing - Balance Support: Bilateral upper extremity supported (RW) Dynamic Standing - Level of Assistance: 5: Stand by assistance (CGA) Dynamic Standing - Comments: with stand<>pivot transfers Extremity/Trunk Assessment RUE Assessment RUE Assessment: Within Functional Limits LUE Assessment LUE Assessment: Exceptions to [facility] Passive Range of Motion (PROM) Comments: shoulder flexion 0-110 degrees, -20 degrees elbow extension end range General Strength  Comments: Brunnstrum stage V movement in the arm with stage VI in the hand.  Increased tone also noted in the elbow and wrist flexors.  Uses it at a diminshed level for selfcare tasks.   Naitik Hermann E Kiwanna Spraker 04/06/2021, 7:51 AM

## 2021-04-06 NOTE — Progress Notes (Signed)
Occupational Therapy Session Note  Patient Details  Name: Adam Perez MRN: [id number] Date of Birth: 01-31-1956  Today's Date: 04/06/2021 OT Individual Time: 3704-8889 OT Individual Time Calculation (min): 53 min    Short Term Goals: Week 2:  OT Short Term Goal 1 (Week 2): STG = LTG due to ELOS  Skilled Therapeutic Interventions/Progress Updates:  Skilled OT intervention completed with focus on tub/shower transfers, DME and kitchen management education, as well as practice with donning/doffing limb guard. Pt received seated in w/c, agreeable to session. Pt reporting that he has TTB that he is borrowing from a neighbor, but has not used it yet, and therapist recommending that pt wait until [facility] completes session with him at home before completing full shower due to pt's sponge bathing method preference in hospital and safety concern with showering despite transfer method as well as hygiene of the residual limb/lack of responsibility with covering the limb. Pt using hemi-technique for self-propelling in w/c <> ADL apartment with supervision. Demonstration provided to pt on stand/sit pivot technique for transferring from chair <> TTB in shower, with pt able to return demonstrate with close supervision for all components while using RW. Pt educated on importance of having someone around for supervision for all showers when ready, as well as measures to take to increase safety including shower curtain management and limb wrapping for covering the wound. In kitchen, therapist educated pt on simple meal prep safety/efficiency techniques with encouragement to complete sit > stands to retrieve items however pt insistent that someone will take care of all meals/snacks and pt later stating "well the HHOT can help me with that." Back in room, pt able to complete doffing/donning of limb guard with supervision only while pt seated in w/c. Education provided to pt to increase efficiency including looping method to  prevent difficulty with having to use both hands for unvelcro-ing. Pt left seated in w/c, with chair alarm on and all needs in reach at end of session.   Therapy Documentation Precautions:  Precautions Precautions: Fall Precaution Comments: LLE NWB Required Braces or Orthoses: Other Brace Other Brace: limb protector Restrictions Weight Bearing Restrictions: Yes LLE Weight Bearing: Non weight bearing LLE Partial Weight Bearing Percentage or Pounds: \  Pain: No c/o pain   Therapy/Group: Individual Therapy  [name] 04/06/2021, 7:43 AM

## 2021-04-07 DIAGNOSIS — E669 Obesity, unspecified: Secondary | ICD-10-CM

## 2021-04-07 DIAGNOSIS — I739 Peripheral vascular disease, unspecified: Secondary | ICD-10-CM

## 2021-04-07 DIAGNOSIS — E1169 Type 2 diabetes mellitus with other specified complication: Secondary | ICD-10-CM

## 2021-04-07 LAB — GLUCOSE, CAPILLARY
Glucose-Capillary: 95 mg/dL (ref 70–99)
Glucose-Capillary: 124 mg/dL — ABNORMAL HIGH (ref 70–99)
Glucose-Capillary: 142 mg/dL — ABNORMAL HIGH (ref 70–99)
Glucose-Capillary: 101 mg/dL — ABNORMAL HIGH (ref 70–99)

## 2021-04-07 NOTE — Progress Notes (Incomplete)
Inpatient Rehabilitation Discharge Medication Review by a Pharmacist  A complete drug regimen review was completed for this patient to identify any potential clinically significant medication issues.  High Risk Drug Classes Is patient taking? Indication by Medication  Antipsychotic No   Anticoagulant No   Antibiotic No   Opioid Yes Vicodin for pain  Antiplatelet Yes ASA/plavix - CVA  Hypoglycemics/insulin Yes SSI/metformin - DM  Vasoactive Medication Yes Amlodipine - HTN  Chemotherapy No   Other Yes Protonix- GERD Baclofen - spasms Atorvastatin - HLD/PAD     Type of Medication Issue Identified Description of Issue Recommendation(s)  Drug Interaction(s) (clinically significant)     Duplicate Therapy     Allergy     No Medication Administration End Date     Incorrect Dose     Additional Drug Therapy Needed  Lantus, lisinopril-hydrochlorothiazide, lurasidone, percocet, zoloft, trazodone Message Dan Angiulli, PA  Significant med changes from prior encounter (inform family/care partners about these prior to discharge).    Other       Clinically significant medication issues were identified that warrant physician communication and completion of prescribed/recommended actions by midnight of the next day:  No  Time spent performing this drug regimen review (minutes):  20  Pau Banh BS, PharmD, BCPS Clinical Pharmacist 04/07/2021 7:13 AM

## 2021-04-07 NOTE — Progress Notes (Signed)
PROGRESS NOTE   Subjective/Complaints: Pt reports that his pain is under reasonable control. More upset about [employer] losing than anything else this morning  ROS: Patient denies fever, rash, sore throat, blurred vision, dizziness, nausea, vomiting, diarrhea, cough, shortness of breath or chest pain,  back/neck pain, headache, or mood change.    Objective:   No results found. No results for input(s): WBC, HGB, HCT, PLT in the last 72 hours.  No results for input(s): NA, K, CL, CO2, GLUCOSE, BUN, CREATININE, CALCIUM in the last 72 hours.   Intake/Output Summary (Last 24 hours) at 04/07/2021 1039 Last data filed at 04/07/2021 0600 Gross per 24 hour  Intake 780 ml  Output 1650 ml  Net -870 ml        Physical Exam: Vital Signs Blood pressure 128/82, pulse 89, temperature 98.3 F (36.8 C), resp. rate 17, height 5\' 7"  (1.702 m), weight 90.5 kg, SpO2 100 %. Constitutional: No distress . Vital signs reviewed. HEENT: NCAT, EOMI, oral membranes moist Neck: supple Cardiovascular: RRR without murmur. No JVD    Respiratory/Chest: CTA Bilaterally without wheezes or rales. Normal effort    GI/Abdomen: BS +, non-tender, non-distended Ext: no clubbing, cyanosis, or edema Psych: pleasant and cooperative  Skin:    Comments: Left BKA site with limb guard protecto .  Neurological:     Comments: Patient is alert and a bit anxious.  Makes eye contact with examiner.  He does recall his latest CIR admission.  Oriented x3.  MSK:+ left stump pain  Assessment/Plan: 1. Functional deficits which require 3+ hours per day of interdisciplinary therapy in a comprehensive inpatient rehab setting. Physiatrist is providing close team supervision and 24 hour management of active medical problems listed below. Physiatrist and rehab team continue to assess barriers to discharge/monitor patient progress toward functional and medical goals  Care  Tool:  Bathing    Body parts bathed by patient: Right arm, Left arm, Chest, Abdomen, Front perineal area, Buttocks, Right upper leg, Left upper leg, Face, Right lower leg     Body parts n/a: Left lower leg   Bathing assist Assist Level: Supervision/Verbal cueing     Upper Body Dressing/Undressing Upper body dressing   What is the patient wearing?: Pull over shirt    Upper body assist Assist Level: Supervision/Verbal cueing    Lower Body Dressing/Undressing Lower body dressing      What is the patient wearing?: Pants     Lower body assist Assist for lower body dressing: Minimal Assistance - Patient > 75%     Toileting Toileting    Toileting assist Assist for toileting: Contact Guard/Touching assist Assistive Device Comment:  (urinal)   Transfers Chair/bed transfer  Transfers assist     Chair/bed transfer assist level: Contact Guard/Touching assist Chair/bed transfer assistive device: Armrests,   Ambulation assist   Ambulation activity did not occur: Safety/medical concerns (pain, weakness)  Assist level: Minimal Assistance - Patient > 75% Assistive device: Other (comment) (// bars) Max distance: 52ft   Walk 10 feet activity   Assist  Walk 10 feet activity did not occur: Safety/medical concerns (pain, weakness)  Walk 50 feet activity   Assist Walk 50 feet with 2 turns activity did not occur: Safety/medical concerns (pain, weakness)         Walk 150 feet activity   Assist Walk 150 feet activity did not occur: Safety/medical concerns (pain, weakness)         Walk 10 feet on uneven surface  activity   Assist Walk 10 feet on uneven surfaces activity did not occur: Safety/medical concerns (pain, weakness)         Wheelchair     Assist Is the patient using a wheelchair?: Yes Type of Wheelchair: Manual    Wheelchair assist level: Supervision/Verbal cueing Max wheelchair distance: >329ft     Wheelchair 50 feet with 2 turns activity    Assist        Assist Level: Supervision/Verbal cueing   Wheelchair 150 feet activity     Assist      Assist Level: Supervision/Verbal cueing   Blood pressure 128/82, pulse 89, temperature 98.3 F (36.8 C), resp. rate 17, height 5\' 7"  (1.702 m), weight 90.5 kg, SpO2 100 %.  Medical Problem List and Plan: 1. 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  Impaired mobility: continue Heparin             -antiplatelet therapy: Aspirin 81 mg daily and Plavix 75 mg daily 3. Phantom limb spasms: increased baclofen to 5mg  TID, hydrocodone as needed  -pt reported that pain controlled today 4. Mood/PTSD: Provide emotional support             -antipsychotic agents: N/A 5. Neuropsych: This patient is capable of making decisions on his own behalf. 6. Skin/Wound Care: Routine skin checks 7. Fluids/Electrolytes/Nutrition: Routine in and outs with follow-up chemistries 8.  Acute on chronic anemia.  Hgb improving, discussed with patient.  9.  Hypertension.  Decrease Norvasc to 5 mg daily.  Monitor with increased mobility 10.  Diabetes mellitus with peripheral neuropathy.  Latest hemoglobin A1c 6.1.  Increase Glucophage 1,000 mg twice daily.   CBG (last 3)  Recent Labs    04/06/21 1647 04/06/21 2124 04/07/21 0608  GLUCAP 106* 120* 95  1/28 cbg's controlled 11.  Hyperlipidemia.  Continue Lipitor 12.  History of CVA with left-sided residual weakness.  Continue aspirin 13. Transaminitis: repeat CMP next week 14. Low protein: recommended choosing high protein foods.  15. Obesity: provided with list of foods that are good for weight loss.  16. PTSD: discussed his traumatic experiences in the war.  17. Right shoulder  pain: added kpad. Provided with pain relief journal.  18. Decreased awareness of deficits: will need 24/7 supervision at home.     LOS: 8 days A FACE TO FACE EVALUATION WAS PERFORMED  04/09/21 04/07/2021, 10:39 AM

## 2021-04-07 NOTE — Progress Notes (Signed)
Occupational Therapy Session Note  Patient Details  Name: Adam Perez MRN: [id number] Date of Birth: 04-12-1955  Today's Date: 04/07/2021 OT Individual Time: 1005-1059 OT Individual Time Calculation (min): 54 min    Short Term Goals: Week 2:  OT Short Term Goal 1 (Week 2): STG = LTG due to ELOS  Skilled Therapeutic Interventions/Progress Updates:  Skilled OT intervention completed with focus on d/c planning, discussion or rehab goals and POC, functional transfers, endurance. Pt received upright in bed, agreeable to session. Pt completed bed mobility with supervision, then sitting EOB able to donn limb guard with cues on method for efficiency due to mostly hemi technique. Pt sit > stand using RW with supervision, then CGA stand pivot to w/c. Pt able to position himself seated in w/c at sink for supervision grooming tasks. Pt then used hemi-technique to self-propel from room <> gym with supervision. Pt seated at SCIFIT, participated in RUE strengthening in interval rounds to promote endurance for functional tasks including the following:   3 mins, forwards, on level 1.5, with BUE on handles with ACE wrap applied to L hand to maintain hold due to limited gripping 3 mins, forward,  on RUE only, level 4.5 3 mins, backwards, on RUE only, level 4 3 mins backwards, on RUE only, level 6  Pt requiring rest breaks due to fatigue, as well as discontinued L hand on handle during task due to flexor tone in elbow and shoulder that limited pt's form and comfort level. Pt left seated in w/c with chair alarm on and all needs in reach at end of session.   Therapy Documentation Precautions:  Precautions Precautions: Fall Precaution Comments: LLE NWB Required Braces or Orthoses: Other Brace Other Brace: limb protector Restrictions Weight Bearing Restrictions: Yes LLE Weight Bearing: Non weight bearing LLE Partial Weight Bearing Percentage or Pounds: \  Pain: No c/o pain   Therapy/Group:  Individual Therapy  Adam Perez 04/07/2021, 7:42 AM

## 2021-04-07 NOTE — Discharge Summary (Signed)
Physician Discharge Summary  Patient ID: Adam Perez MRN: [id number] DOB/AGE: 10/05/1955 66 y.o.  Admit date: 03/30/2021 Discharge date: 04/09/2021  Discharge Diagnoses:  Principal Problem:   Left below-knee amputee (HCC) Hypertension Diabetes mellitus Acute on chronic anemia PTSD Hyperlipidemia History of CVA with left-sided residual weakness Obesity Transaminitis  Discharged Condition: Stable  Significant Diagnostic Studies: VAS US ABI WITH/WO TBI  Result Date: 03/13/2021  LOWER EXTREMITY DOPPLER STUDY Patient Name:  Adam Perez  Date of Exam:   03/13/2021 Medical Rec #: [id number]       Accession #:    [id number] Date of Birth: 10/05/1955       Patient Gender: M Patient Age:   [age] years [facility] Procedure:      VAS US ABI WITH/WO TBI Referring Phys: Sherald HessHRISTOPHER CLARK --------------------------------------------------------------------------------  Indications: Peripheral artery disease. High Risk Factors: Hypertension, hyperlipidemia, Diabetes.  Vascular Interventions: 02/19/2021: Left popliteal angioplasty. Left                         transmetatarsal amputation. Limitations: Today's exam was limited due to an open wound. Comparison Study: 02/15/2021: Rt ABI 1.15; Lt ABI 0.77. Performing Technologist: Ethelle LyonMegan Krolak  Examination Guidelines: A complete evaluation includes at minimum, Doppler waveform signals and systolic blood pressure reading at the level of bilateral brachial, anterior tibial, and posterior tibial arteries, when vessel segments are accessible. Bilateral testing is considered an integral part of a complete examination. Photoelectric Plethysmograph (PPG) waveforms and toe systolic pressure readings are included as required and additional duplex testing as needed. Limited examinations for reoccurring indications may be performed as noted.  ABI Findings: +---------+------------------+-----+---------+--------+  Right     Rt Pressure  (mmHg) Index Waveform  Comment   +---------+------------------+-----+---------+--------+  Brachial  112                                          +---------+------------------+-----+---------+--------+  PTA       136                1.21  triphasic           +---------+------------------+-----+---------+--------+  DP        116                1.04  triphasic           +---------+------------------+-----+---------+--------+  Great Toe 58                 0.52                      +---------+------------------+-----+---------+--------+ +--------+------------------+-----+--------+-------+  Left     Lt Pressure (mmHg) Index Waveform Comment  +--------+------------------+-----+--------+-------+  Brachial 106                                        +--------+------------------+-----+--------+-------+  PTA                               absent            +--------+------------------+-----+--------+-------+  DP       150  1.34  biphasic          +--------+------------------+-----+--------+-------+ +-------+-----------+-----------+------------+------------+  ABI/TBI Today's ABI Today's TBI Previous ABI Previous TBI  +-------+-----------+-----------+------------+------------+  Right   1.21        0.52        1.15                       +-------+-----------+-----------+------------+------------+  Left    [place]          amputated   0.77                       +-------+-----------+-----------+------------+------------+  Arterial wall calcification precludes accurate ankle pressures and ABIs.  Summary: Right: Resting right ankle-brachial index is within normal range. No evidence of significant right lower extremity arterial disease. The right toe-brachial index is abnormal. Left: Resting left ankle-brachial index indicates noncompressible left lower extremity arteries.  *See table(s) above for measurements and observations.  Electronically signed by Sherald Hess MD on 03/13/2021 at 3:55:49 PM.    Final     Labs:  Basic  Metabolic Panel: Recent Labs  Lab 04/02/21 0640  NA 138  K 4.0  CL 105  CO2 24  GLUCOSE 101*  BUN 15  CREATININE 1.00  CALCIUM 9.0    CBC: Recent Labs  Lab 04/02/21 0640  WBC 8.6  NEUTROABS 5.7  HGB 8.9*  HCT 26.0*  MCV 86.7  PLT 359    CBG: Recent Labs  Lab 04/07/21 2052 04/08/21 0627 04/08/21 1135 04/08/21 1650 04/08/21 2111  GLUCAP 101* 99 138* 126* 123*   Family history.  Brother with diabetes.  Denies any colon cancer esophageal cancer or rectal cancer  Brief HPI:   Adam Perez is a 66 y.o. right-handed male with history of diabetes mellitus CVA left-sided residual weakness hypertension PTSD peripheral vascular disease with left popliteal angioplasty left transmetatarsal amputation and left calf venous ulcer Sharp excisional debridement application of skin substitute 02/19/2021 receiving inpatient rehab services 02/23/2021 - 03/07/2021.  He was discharged home with dressing changes as indicated.  Per chart review currently living with his wife.  Used a wheelchair and walker prior to admission.  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 [name].  Wound VAC was applied.  Patient remained on aspirin and Plavix as prior to admission.  Acute on chronic anemia monitored with latest hemoglobin 8.6.  He was cleared to begin subcutaneous heparin for DVT prophylaxis.  Therapy evaluations completed due to patient decreased functional mobility was admitted for a comprehensive rehab program.   Hospital Course: Adam Perez was admitted to rehab 03/30/2021 for inpatient therapies to consist of PT, ST and OT at least three hours five days a week. Past admission physiatrist, therapy team and rehab RN have worked together to provide customized collaborative inpatient rehab.  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  Patient  follow-up per vascular surgery.  Remains on subcutaneous heparin for DVT prophylaxis.  Aspirin and Plavix ongoing as prior to admission.  Phantom limb spasms with the use of baclofen scheduled and hydrocodone as needed.  Mood stabilization with history of PTSD emotional support provided patient was attending full therapies.  Acute on chronic anemia hemoglobin improved no bleeding episodes.  Blood pressure monitored remained somewhat soft low-dose amlodipine and he can resume his Zestoretic on discharge.  Blood sugars monitored hemoglobin A1c 6.1  Glucophage as well as Lantus insulin as indicated and patient would need outpatient follow-up.  Lipitor ongoing for hyperlipidemia.  History of CVA with left-sided residual weakness continue aspirin.  Mood stabilization with Latuda and Zoloft as prior to admission.  He had some right shoulder discomfort K pad added provided relief.   Blood pressures were monitored on TID basis and soft and monitored  Diabetes has been monitored with ac/hs CBG checks and SSI was use prn for tighter BS control.    Rehab course: During patient's stay in rehab weekly team conferences were held to monitor patient's progress, set goals and discuss barriers to discharge. At admission, patient required minimal assist lateral scoot transfers minimal assist sit to stand  Physical exam.  Blood pressure 99/64 pulse 89 temperature 98.3 respirations 17 oxygen saturations 100% room air Constitutional.  No acute distress HEENT Head.  Normocephalic and atraumatic Eyes.  Pupils round and reactive to light no discharge without nystagmus Neck.  Supple nontender no JVD without thyromegaly Cardiac regular rate rhythm moderate extra sounds or murmur heard Abdomen.  Soft nontender positive bowel sounds without rebound Respiratory effort normal no respiratory distress without wheeze Skin.  Left BKA site limb guard protector as well as wound VAC Neurologic.  Alert oriented x3 follows full  commands.  He/She  has had improvement in activity tolerance, balance, postural control as well as ability to compensate for deficits. He/She has had improvement in functional use RUE/LUE  and RLE/LLE as well as improvement in awareness.  Sessions with emphasis on discharge planning functional mobility transfers generalized strengthening dynamic standing balance and coordination.  Donned right shoe with set up assist and limb guard with max assist and perform wheelchair mobility 75 feet using right Hemi technique with supervision to the Ortho gym.  Patient perform simulated car transfers rolling walker contact-guard.  Patient able to recall techniques cues for safety without assist from therapist.  Sit to stand rolling walker contact-guard close supervision and worked on dynamic standing balance coordination.  Perform standing left hip flexion and left hip abduction 2x22 fatigue.  Completed bed mobility supervision sit to stand using rolling walker supervision contact-guard with ADLs completed.  Patient able to position himself seated at wheelchair at sink for supervision grooming.  Full family teaching completed plan discharged home       Disposition: Discharge to home    Diet: Diabetic diet  Special Instructions: No driving smoking or alcohol  Medications at discharge. 1.  Tylenol as needed 2.  Norvasc 5 mg p.o. daily 3.  Aspirin 81 mg p.o. daily 4.  Lipitor 40 mg p.o. daily 5.  Baclofen 5 mg p.o. 3 times daily 6.  Plavix and 5 mg p.o. daily 7.  Colace 100 mg p.o. daily 8.  Hydrocodone 1 to 2 tablets every 4 hours as needed pain 9.  Glucophage 1000 mg p.o. twice daily 10.  Protonix 40 mg p.o. daily 11.  Zestoretic 20-25 mg daily 12.  Latuda 20 mg daily. 13.  Zoloft 150 mg daily 14.  Trazodone 100 mg nightly 15.  Lantus insulin 10 units daily  30-35 minutes spent completing discharge summary and discharge planning  Discharge Instructions     Ambulatory referral to Physical  Medicine Rehab   Complete by: As directed    Moderate complexity follow up 1-2 weeks left BKA        Follow-up Information     Raulkar, Drema Pry, MD Follow up.   Specialty: Physical Medicine and Rehabilitation Why: 07/24/21 please  arrive at 1:20pm for 1:40pm appointment, thank you! Contact information: [address] [place] [address] [phone]                 Signed: Charlton AmorDaniel J Samya Siciliano 04/09/2021, 5:18 AM

## 2021-04-08 LAB — GLUCOSE, CAPILLARY
Glucose-Capillary: 99 mg/dL (ref 70–99)
Glucose-Capillary: 138 mg/dL — ABNORMAL HIGH (ref 70–99)
Glucose-Capillary: 126 mg/dL — ABNORMAL HIGH (ref 70–99)
Glucose-Capillary: 123 mg/dL — ABNORMAL HIGH (ref 70–99)

## 2021-04-08 NOTE — Progress Notes (Signed)
Physical Therapy Session Note  Patient Details  Name: Adam Perez MRN: [id number] Date of Birth: 07-Oct-1955  Today's Date: 04/08/2021 PT Individual Time: 0945-1030 PT Individual Time Calculation (min): 45 min   Short Term Goals: Week 1:  PT Short Term Goal 1 (Week 1): STG=LTG due to LOS  Skilled Therapeutic Interventions/Progress Updates: Pt presents sitting in w/c and agreeable to therapy.  Pt very talkative and requires re-direction to tasks.  Pt wheeled self in w/c > 350 in hallways and through doorways w/ supervision w/ hemi technique.  Pt transfers sit to stand w/ CGA after placing L UE on handrest of RW.  Pt able to transfer w/c <> car, mat table and bed w/ CGA, but unable to clear floor w/ R foot 2/2 L hemi. Pt performed car transfer w/ car door positioned to simulate real car opening.  Pt performed sit to supine transfer w/ supervision and flat bed.  Pt returned to w/c and remained sitting w/ seat alarm on and all needs in reach.     Therapy Documentation Precautions:  Precautions Precautions: Fall Precaution Comments: LLE NWB Required Braces or Orthoses: Other Brace Other Brace: limb protector Restrictions Weight Bearing Restrictions: Yes LLE Weight Bearing: Non weight bearing LLE Partial Weight Bearing Percentage or Pounds: \ General:   Vital Signs:   Pain:0/10      Therapy/Group: Individual Therapy  Lucio Edward 04/08/2021, 12:12 PM

## 2021-04-08 NOTE — Progress Notes (Signed)
Occupational Therapy Session Note  Patient Details  Name: Adam Perez MRN: [id number] Date of Birth: 1955-09-04  Today's Date: 04/08/2021 OT Individual Time: 3291-9166 OT Individual Time Calculation (min): 50 min    Short Term Goals: Week 1:  OT Short Term Goal 1 (Week 1): Pt will complete LB bathing sit to stand with min guard assist for two consecutive sessions. OT Short Term Goal 1 - Progress (Week 1): Met OT Short Term Goal 2 (Week 1): Pt will complete LB dressing with min guard sit to stand including donning limb guard on the LLE. OT Short Term Goal 2 - Progress (Week 1): Met OT Short Term Goal 3 (Week 1): Pt will complete toilet transfer at min guard assist to the 3:1 with use of the RW for support. OT Short Term Goal 3 - Progress (Week 1): Met OT Short Term Goal 4 (Week 1): Pt will complete standing grooming task with no more than min cues to adhere to LLE PWB precautions. Week 2:  OT Short Term Goal 1 (Week 2): STG = LTG due to ELOS  Skilled Therapeutic Interventions/Progress Updates:    Patient in bed upon arrival indicating L shld pain on a 6 on 0-10 scale , which was reported as a common occurrence first thing in the AM.  The pt expressed a desire to get up and wash up at sink LOF.. The pt was able to come from supine to EOB with CGA-MinA , he was able to complete a stand pivot transfer from EOB to w/c LOF using the RW for balance witn MinA with his LLE supported .  The pt was able to roll to the sink area with the with MinA using his RLE.  The pt was able to remove his UB  clothing items consisting of a over head shirt with  minimal verbal cues for how he approach the task in relation to hemiparetic side.  The pt was MinA for removing his pants in preparation for bathing at sink LOF.  The pt was able to wash his UB with s/u, but he required MinA with LB bathing at chair level secondary to challenges with reaching more distal extremities.  The pt was able to donn his clothing items  for UB with s/u and MinA for LB secondary to NWB status associated with the LLE. The pt returned to bed LOF with the call light in place, his bed alarm activated, and all additional needs addressed.    Therapy Documentation Precautions:  Precautions Precautions: Fall Precaution Comments: LLE NWB Required Braces or Orthoses: Other Brace Other Brace: limb protector Restrictions Weight Bearing Restrictions: Yes LLE Weight Bearing: Non weight bearing LLE Partial Weight Bearing Percentage or Pounds: \ General:   Vital Signs:   Pain: Pain Assessment Pain Scale: 0-10 Pain Score: 0-No pain   Perception    Praxis   Balance   Exercises:   Other Treatments:     Therapy/Group: Individual Therapy  Yvonne Kendall 04/08/2021, 9:13 AM

## 2021-04-09 ENCOUNTER — Other Ambulatory Visit (HOSPITAL_COMMUNITY): Payer: Self-pay

## 2021-04-09 LAB — COMPREHENSIVE METABOLIC PANEL
ALT: 35 U/L (ref 0–44)
AST: 17 U/L (ref 15–41)
Albumin: 3 g/dL — ABNORMAL LOW (ref 3.5–5.0)
Alkaline Phosphatase: 73 U/L (ref 38–126)
Anion gap: 11 (ref 5–15)
BUN: 22 mg/dL (ref 8–23)
CO2: 23 mmol/L (ref 22–32)
Calcium: 9.4 mg/dL (ref 8.9–10.3)
Chloride: 106 mmol/L (ref 98–111)
Creatinine, Ser: 1.16 mg/dL (ref 0.61–1.24)
GFR, Estimated: >60 mL/min (ref >60)
Glucose, Bld: 114 mg/dL — ABNORMAL HIGH (ref 70–99)
Potassium: 4.3 mmol/L (ref 3.5–5.1)
Sodium: 140 mmol/L (ref 135–145)
Total Bilirubin: 0.2 mg/dL — ABNORMAL LOW (ref 0.3–1.2)
Total Protein: 6.8 g/dL (ref 6.5–8.1)

## 2021-04-09 LAB — GLUCOSE, CAPILLARY: Glucose-Capillary: 106 mg/dL — ABNORMAL HIGH (ref 70–99)

## 2021-04-09 MED ORDER — CLOPIDOGREL BISULFATE 75 MG PO TABS: 75.0000 mg | ORAL_TABLET | Freq: Every day | ORAL | 0 refills | Status: AC

## 2021-04-09 MED ORDER — ACETAMINOPHEN 325 MG PO TABS: 325.0000 mg | ORAL_TABLET | Freq: Four times a day (QID) | ORAL | Status: AC | PRN

## 2021-04-09 MED ORDER — PANTOPRAZOLE SODIUM 40 MG PO TBEC
40.0000 mg | DELAYED_RELEASE_TABLET | Freq: Every day | ORAL | 0 refills | Status: DC
  Filled 2021-04-09: qty 30, 30d supply, fill #0

## 2021-04-09 MED ORDER — AMLODIPINE BESYLATE 5 MG PO TABS: 5.0000 mg | ORAL_TABLET | Freq: Every day | ORAL | 0 refills | Status: AC

## 2021-04-09 MED ORDER — CLOPIDOGREL BISULFATE 75 MG PO TABS
75.0000 mg | ORAL_TABLET | Freq: Every day | ORAL | 0 refills | Status: DC
  Filled 2021-04-09: qty 30, 30d supply, fill #0

## 2021-04-09 MED ORDER — INSULIN GLARGINE 100 UNIT/ML SOLOSTAR PEN
10.0000 [IU] | PEN_INJECTOR | Freq: Every day | SUBCUTANEOUS | 11 refills | Status: DC
  Filled 2021-04-09: qty 3, 30d supply, fill #0

## 2021-04-09 MED ORDER — ATORVASTATIN CALCIUM 40 MG PO TABS: 40.0000 mg | ORAL_TABLET | Freq: Every day | ORAL | 0 refills | Status: AC

## 2021-04-09 MED ORDER — OXYCODONE-ACETAMINOPHEN 5-325 MG PO TABS
1.0000 | ORAL_TABLET | ORAL | 0 refills | Status: AC | PRN
  Filled 2021-04-09: qty 30, 3d supply, fill #0

## 2021-04-09 MED ORDER — ATORVASTATIN CALCIUM 40 MG PO TABS
40.0000 mg | ORAL_TABLET | Freq: Every day | ORAL | 0 refills | Status: DC
  Filled 2021-04-09: qty 30, 30d supply, fill #0

## 2021-04-09 MED ORDER — METFORMIN HCL 1000 MG PO TABS
1000.0000 mg | ORAL_TABLET | Freq: Two times a day (BID) | ORAL | 0 refills | Status: DC
  Filled 2021-04-09: qty 60, 30d supply, fill #0

## 2021-04-09 MED ORDER — INSULIN GLARGINE 100 UNIT/ML SOLOSTAR PEN: 10.0000 [IU] | PEN_INJECTOR | Freq: Every day | SUBCUTANEOUS | 11 refills | Status: AC

## 2021-04-09 MED ORDER — BACLOFEN 5 MG PO TABS
5.0000 mg | ORAL_TABLET | Freq: Three times a day (TID) | ORAL | 0 refills | Status: DC
  Filled 2021-04-09: qty 90, 30d supply, fill #0

## 2021-04-09 MED ORDER — AMLODIPINE BESYLATE 5 MG PO TABS
5.0000 mg | ORAL_TABLET | Freq: Every day | ORAL | 0 refills | Status: DC
  Filled 2021-04-09: qty 30, 30d supply, fill #0

## 2021-04-09 MED ORDER — METFORMIN HCL 1000 MG PO TABS: 1000.0000 mg | ORAL_TABLET | Freq: Two times a day (BID) | ORAL | 0 refills | Status: AC

## 2021-04-09 MED ORDER — BACLOFEN 5 MG PO TABS: 5.0000 mg | ORAL_TABLET | Freq: Three times a day (TID) | ORAL | 0 refills | Status: AC

## 2021-04-09 MED ORDER — PANTOPRAZOLE SODIUM 40 MG PO TBEC: 40.0000 mg | DELAYED_RELEASE_TABLET | Freq: Every day | ORAL | 0 refills | Status: AC

## 2021-04-09 NOTE — Progress Notes (Signed)
Inpatient Rehabilitation Care Coordinator Discharge Note   Patient Details  Name: ULICE FOLLETT MRN: [id number] Date of Birth: 03-Feb-1956   Discharge location: Home  Length of Stay:    Discharge activity level: sup/cga  Home/community participation: ex spouse  Patient response TK:[id number] Literacy - How often do you need to have someone help you when you read instructions, pamphlets, or other written material from your doctor or pharmacy?: Sometimes  Patient response [id number] Isolation - How often do you feel lonely or isolated from those around you?: Never  Services provided included: SW, Pharmacy, TR, CM, RN, SLP, OT, PT, RD, MD  Financial Services:  Financial Services Utilized: Medicare    Choices offered to/list presented to: patient  Follow-up services arranged:  Home Health Home Health Agency: TBD    DME : Wheelchair    Patient response to transportation need: Is the patient able to respond to transportation needs?: Yes In the past 12 months, has lack of transportation kept you from medical appointments or from getting medications?: No In the past 12 months, has lack of transportation kept you from meetings, work, or from getting things needed for daily living?: No    Comments (or additional information):  Patient/Family verbalized understanding of follow-up arrangements:  Yes  Individual responsible for coordination of the follow-up plan: Pattricia Boss [phone]  Confirmed correct DME delivered: Andria Rhein 04/09/2021    Andria Rhein

## 2021-04-09 NOTE — Progress Notes (Addendum)
°  Progress Note    04/09/2021 8:05 AM * No surgery found *  Subjective:  Minimal pain L BKA   Vitals:   04/08/21 1919 04/09/21 0244  BP: 96/62 109/77  Pulse: 94 89  Resp: 18 18  Temp: 99.2 F (37.3 C) 98.8 F (37.1 C)  SpO2: 100% 100%    Physical Exam: Incisions:  L BKA with some sanguinous oozing, no purulence   CBC    Component Value Date/Time   WBC 8.6 04/02/2021 0640   RBC 3.00 (L) 04/02/2021 0640   HGB 8.9 (L) 04/02/2021 0640   HCT 26.0 (L) 04/02/2021 0640   PLT 359 04/02/2021 0640   MCV 86.7 04/02/2021 0640   MCH 29.7 04/02/2021 0640   MCHC 34.2 04/02/2021 0640   RDW 15.4 04/02/2021 0640   LYMPHSABS 1.9 04/02/2021 0640   MONOABS 0.8 04/02/2021 0640   EOSABS 0.1 04/02/2021 0640   BASOSABS 0.0 04/02/2021 0640    BMET    Component Value Date/Time   NA 140 04/09/2021 0546   K 4.3 04/09/2021 0546   CL 106 04/09/2021 0546   CO2 23 04/09/2021 0546   GLUCOSE 114 (H) 04/09/2021 0546   BUN 22 04/09/2021 0546   CREATININE 1.16 04/09/2021 0546   CALCIUM 9.4 04/09/2021 0546   GFRNONAA >60 04/09/2021 0546   GFRAA >60 07/06/2019 0407   GFRAA >60 07/06/2019 0407    INR    Component Value Date/Time   INR 1.1 03/26/2021 0555     Intake/Output Summary (Last 24 hours) at 04/09/2021 0805 Last data filed at 04/09/2021 0720 Gross per 24 hour  Intake 177 ml  Output 2250 ml  Net -2073 ml     Assessment/Plan:  66 y.o. male is s/p Left below knee amputation  * No surgery found *  - L BKA dressing changed today; this had not been changed in 1 week; continue dressing changes daily; he will also be fitted for a limb shrinker prior to d/c home - Encouraged patient to wash stump with soap and water daily - He will follow up in office in about 2 weeks for incision check   Emilie Rutter, PA-C [facility] [phone] 04/09/2021 8:05 AM

## 2021-04-09 NOTE — Progress Notes (Signed)
Patient discharged off of unit with all belongings. Discharge papers/instructions explained by physician assistant to family. Patient and family have no further questions at time of discharge. No complications noted at this time. 2 shrinkers dropped off with patient from ortho. Shrinkers handed directly to patient and education provided to wife and patient about stump care and proper use of shrinker. Meredeth Ide Florena Kozma

## 2021-04-09 NOTE — Progress Notes (Signed)
Inpatient Rehabilitation Discharge Medication Review by a Pharmacist  A complete drug regimen review was completed for this patient to identify any potential clinically significant medication issues.  High Risk Drug Classes Is patient taking? Indication by Medication  Antipsychotic Yes Latuda- MDD  Anticoagulant No   Antibiotic No   Opioid Yes Percocet- pain post-op  Antiplatelet Yes Aspirin / Plavix- CVA proph  Hypoglycemics/insulin Yes Lantus, metformin- T2DM  Vasoactive Medication Yes Zestoretic, Norvasc- hypertension  Chemotherapy No   Other Yes Zoloft- MDD Desyrel- sleep Protonix- GERD Baclofen- muscle spasms Lipitor- HLD     Type of Medication Issue Identified Description of Issue Recommendation(s)  Drug Interaction(s) (clinically significant)     Duplicate Therapy     Allergy     No Medication Administration End Date     Incorrect Dose     Additional Drug Therapy Needed     Significant med changes from prior encounter (inform family/care partners about these prior to discharge).    Other       Clinically significant medication issues were identified that warrant physician communication and completion of prescribed/recommended actions by midnight of the next day:  No  Time spent performing this drug regimen review (minutes):  30   Burt Piatek BS, PharmD, BCPS Clinical Pharmacist 04/09/2021 8:52 AM

## 2021-04-09 NOTE — Progress Notes (Signed)
PROGRESS NOTE   Subjective/Complaints: No new complaints this morning.  Feels ready for discharge Enjoyed reading pain journal and felt if was very informative Feels his amputation is a hug change that he will have to learn to cope with.   ROS: Patient denies fever, rash, sore throat, blurred vision, dizziness, nausea, vomiting, diarrhea, cough, shortness of breath or chest pain,  back/neck pain, headache, or mood change.    Objective:   No results found. No results for input(s): WBC, HGB, HCT, PLT in the last 72 hours.  Recent Labs    04/09/21 0546  NA 140  K 4.3  CL 106  CO2 23  GLUCOSE 114*  BUN 22  CREATININE 1.16  CALCIUM 9.4     Intake/Output Summary (Last 24 hours) at 04/09/2021 1010 Last data filed at 04/09/2021 0720 Gross per 24 hour  Intake 177 ml  Output 2250 ml  Net -2073 ml        Physical Exam: Vital Signs Blood pressure 115/88, pulse 95, temperature 98.7 F (37.1 C), temperature source Oral, resp. rate 19, height 5\' 7"  (1.702 m), weight 90.5 kg, SpO2 100 %. Gen: no distress, normal appearing HEENT: oral mucosa pink and moist, NCAT Cardio: Reg rate Chest: normal effort, normal rate of breathing Abd: soft, non-distended Ext: no edema Psych: pleasant, normal affect  Skin:    Comments: Left BKA site with limb guard protecto .  Neurological:     Comments: Patient is alert and a bit anxious.  Makes eye contact with examiner.  He does recall his latest CIR admission.  Oriented x3.  MSK:+ left stump pain  Assessment/Plan: 1. Functional deficits which require 3+ hours per day of interdisciplinary therapy in a comprehensive inpatient rehab setting. Physiatrist is providing close team supervision and 24 hour management of active medical problems listed below. Physiatrist and rehab team continue to assess barriers to discharge/monitor patient progress toward functional and medical goals  Care  Tool:  Bathing    Body parts bathed by patient: Chest, Right arm, Left arm, Abdomen, Front perineal area, Face, Left upper leg, Right upper leg, Buttocks, Right lower leg   Body parts bathed by helper: Buttocks, Right lower leg, Left lower leg Body parts n/a: Left lower leg (amputation)   Bathing assist Assist Level: Supervision/Verbal cueing     Upper Body Dressing/Undressing Upper body dressing   What is the patient wearing?: Pull over shirt    Upper body assist Assist Level: Independent    Lower Body Dressing/Undressing Lower body dressing      What is the patient wearing?: Pants     Lower body assist Assist for lower body dressing: Contact Guard/Touching assist     Toileting Toileting Toileting Activity did not occur (Clothing management and hygiene only): Refused  Toileting assist Assist for toileting: Contact Guard/Touching assist Assistive Device Comment:  (urinal)   Transfers Chair/bed transfer  Transfers assist     Chair/bed transfer assist level: Contact Guard/Touching assist Chair/bed transfer assistive device: Armrests, Walker   Locomotion Ambulation   Ambulation assist   Ambulation activity did not occur: Safety/medical concerns  Assist level: Minimal Assistance - Patient > 75% Assistive device: Walker-hemi Max distance:  functional mobility demonstrated in the confines of his room, bed to sink.   Walk 10 feet activity   Assist  Walk 10 feet activity did not occur: Safety/medical concerns        Walk 50 feet activity   Assist Walk 50 feet with 2 turns activity did not occur: Safety/medical concerns         Walk 150 feet activity   Assist Walk 150 feet activity did not occur: Safety/medical concerns         Walk 10 feet on uneven surface  activity   Assist Walk 10 feet on uneven surfaces activity did not occur: Safety/medical concerns (pain, weakness)         Wheelchair     Assist Is the patient using a  wheelchair?: Yes Type of Wheelchair: Manual    Wheelchair assist level: Supervision/Verbal cueing Max wheelchair distance: >322ft    Wheelchair 50 feet with 2 turns activity    Assist        Assist Level: Supervision/Verbal cueing   Wheelchair 150 feet activity     Assist      Assist Level: Supervision/Verbal cueing   Blood pressure 115/88, pulse 95, temperature 98.7 F (37.1 C), temperature source Oral, resp. rate 19, height 5\' 7"  (1.702 m), weight 90.5 kg, SpO2 100 %.  Medical Problem List and Plan: 1. 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  Impaired mobility: d/c Heparin upon discharge today.              -antiplatelet therapy: Aspirin 81 mg daily and Plavix 75 mg daily 3. Phantom limb spasms: continue baclofen to 5mg  TID, hydrocodone as needed  -pt reported that pain controlled today 4. Mood/PTSD: Provide emotional support             -antipsychotic agents: N/A 5. Neuropsych: This patient is capable of making decisions on his own behalf. 6. Skin/Wound Care: Routine skin checks 7. Fluids/Electrolytes/Nutrition: Routine in and outs with follow-up chemistries 8.  Acute on chronic anemia.  Hgb improving, discussed with patient.  9.  Hypertension.  Decrease Norvasc to 5 mg daily.  Monitor with increased mobility 10.  Diabetes mellitus with peripheral neuropathy.  Latest hemoglobin A1c 6.1.  Continue Glucophage 1,000 mg twice daily.   CBG (last 3)  Recent Labs    04/08/21 1650 04/08/21 2111 04/09/21 0620  GLUCAP 126* 123* 106*  1/28 cbg's controlled 11.  Hyperlipidemia.  Continue Lipitor 12.  History of CVA with left-sided residual weakness.  Continue aspirin 13. Transaminitis: repeat CMP next week 14. Low protein: recommended  choosing high protein foods.  15. Obesity: provided with list of foods that are good for weight loss.  16. PTSD: discussed his traumatic experiences in the war.  17. Right shoulder pain: added kpad. Provided with pain relief journal.  18. Decreased awareness of deficits: will need 24/7 supervision at home.     LOS: 10 days A FACE TO FACE EVALUATION WAS PERFORMED  Adam Perez 04/09/2021, 10:10 AM

## 2021-04-09 NOTE — Progress Notes (Signed)
Orthopedic Tech Progress Note Patient Details:  Adam Perez 1955-09-22 [id number]  Called in order to [facility] for a SHRINKER for a BKA  Patient ID: Adam Perez, male   DOB: [birth year], 66 y.o.   MRN: [id number]  [name] 04/09/2021, 8:38 AM

## 2021-04-11 ENCOUNTER — Other Ambulatory Visit: Payer: Self-pay | Admitting: Vascular Surgery

## 2021-04-11 DIAGNOSIS — Z899 Acquired absence of limb, unspecified: Secondary | ICD-10-CM

## 2021-04-17 ENCOUNTER — Ambulatory Visit: Payer: Medicare Other | Admitting: Vascular Surgery

## 2021-04-17 ENCOUNTER — Other Ambulatory Visit: Payer: Self-pay

## 2021-04-17 ENCOUNTER — Ambulatory Visit (INDEPENDENT_AMBULATORY_CARE_PROVIDER_SITE_OTHER): Payer: Medicare Other | Admitting: Physician Assistant

## 2021-04-17 VITALS — BP 108/69 | HR 102 | Temp 97.0°F | Resp 18 | Ht 67.0 in | Wt 195.0 lb

## 2021-04-17 DIAGNOSIS — Z899 Acquired absence of limb, unspecified: Secondary | ICD-10-CM

## 2021-04-17 DIAGNOSIS — I739 Peripheral vascular disease, unspecified: Secondary | ICD-10-CM

## 2021-04-17 NOTE — Progress Notes (Signed)
POST OPERATIVE OFFICE NOTE    CC:  F/u for surgery  HPI:  This is a 66 y.o. male who is s/p left BKA  on  by Dr. [name].  The leg had significant edema so nylon sutures with red robin bolster and compression stump sock to help with edema.    Pt returns today for follow up.  Pt states the incision is still draining centrally, but has improved with time.    No Known Allergies  Current Outpatient Medications  Medication Sig Dispense Refill   acetaminophen (TYLENOL) 325 MG tablet Take 1-2 tablets (325-650 mg total) by mouth every 6 (six) hours as needed for mild pain (pain score 1-3 or temp > 100.5).     amLODipine (NORVASC) 5 MG tablet Take 1 tablet (5 mg total) by mouth daily. 30 tablet 0   aspirin EC 81 MG tablet Take 1 tablet (81 mg total) by mouth daily with breakfast. 120 tablet 3   atorvastatin (LIPITOR) 40 MG tablet Take 1 tablet (40 mg total) by mouth daily at 6 PM. 30 tablet 0   Baclofen 5 MG TABS Take 1 tablet (5 mg) by mouth 3 (three) times daily. 90 tablet 0   clopidogrel (PLAVIX) 75 MG tablet Take 1 tablet (75 mg total) by mouth daily. 30 tablet 0   insulin glargine (LANTUS) 100 UNIT/ML Solostar Pen Inject 10 Units into the skin daily. 15 mL 11   lisinopril-hydrochlorothiazide (ZESTORETIC) 20-25 MG tablet Take 1 tablet by mouth daily.     lurasidone (LATUDA) 20 MG TABS tablet Take 1 tablet (20 mg total) by mouth daily. 30 tablet 0   metFORMIN (GLUCOPHAGE) 1000 MG tablet Take 1 tablet (1,000 mg total) by mouth 2 (two) times daily with a meal. 60 tablet 0   Multiple Vitamin (MULTIVITAMIN WITH MINERALS) TABS tablet Take 1 tablet by mouth daily.     oxyCODONE-acetaminophen (PERCOCET/ROXICET) 5-325 MG tablet Take 1-2 tablets by mouth every 4 (four) hours as needed for moderate pain. 30 tablet 0   pantoprazole (PROTONIX) 40 MG tablet Take 1 tablet (40 mg total) by mouth daily. 30 tablet 0   polyethylene glycol (MIRALAX / GLYCOLAX) 17 g packet Take 17 g by mouth 2 (two) times daily as  needed for mild constipation. 14 each 0   senna-docusate (SENOKOT-S) 8.6-50 MG tablet Take 2 tablets by mouth at bedtime.     sertraline (ZOLOFT) 100 MG tablet Take 1.5 tablets (150 mg total) by mouth daily. 45 tablet 0   traZODone (DESYREL) 100 MG tablet Take 1 tablet (100 mg total) by mouth at bedtime. (Patient taking differently: Take 100 mg by mouth at bedtime as needed for sleep.) 30 tablet 0   No current facility-administered medications for this visit.     ROS:  See HPI  Physical Exam:     Incision:  stump has ss drainage centrally, but appears viable Extremities:  edema improving, stump warm to touch. Lungs: non labored breathing    Assessment/Plan:  This is a 66 y.o. 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    The left BKA stump had significant edema that appears to be improving.  He and his wife are cleaning the stump and wrapping it with guaze and then placing the stump sock over the dressing to help with compression.    He will return in 2 weeks for incision check and likely staple and suture removal.  He will then need to return to [facility] for new stump socks and prosthetic fitting.     Roxy Horseman PA-C [facility] [phone]   Clinic MD:  Stanford Breed

## 2021-05-01 ENCOUNTER — Ambulatory Visit: Payer: Medicare Other

## 2021-05-07 ENCOUNTER — Other Ambulatory Visit: Payer: Self-pay | Admitting: Physical Medicine and Rehabilitation

## 2021-05-09 DEATH — deceased

## 2021-07-05 ENCOUNTER — Inpatient Hospital Stay: Payer: Medicare Other | Admitting: Physical Medicine and Rehabilitation

## 2021-07-24 ENCOUNTER — Inpatient Hospital Stay: Payer: Medicare Other | Admitting: Physical Medicine and Rehabilitation
